# Patient Record
Sex: Male | Born: 1945
Health system: Southern US, Community
[De-identification: ages and names within clinical notes are randomized; demographics above are authoritative.]

## PROBLEM LIST (undated history)

## (undated) DIAGNOSIS — F329 Major depressive disorder, single episode, unspecified: Secondary | ICD-10-CM

## (undated) DIAGNOSIS — G4730 Sleep apnea, unspecified: Secondary | ICD-10-CM

## (undated) DIAGNOSIS — G8929 Other chronic pain: Secondary | ICD-10-CM

## (undated) DIAGNOSIS — E785 Hyperlipidemia, unspecified: Secondary | ICD-10-CM

## (undated) DIAGNOSIS — C61 Malignant neoplasm of prostate: Secondary | ICD-10-CM

## (undated) DIAGNOSIS — I10 Essential (primary) hypertension: Secondary | ICD-10-CM

## (undated) DIAGNOSIS — M1990 Unspecified osteoarthritis, unspecified site: Secondary | ICD-10-CM

## (undated) DIAGNOSIS — N529 Male erectile dysfunction, unspecified: Secondary | ICD-10-CM

## (undated) DIAGNOSIS — F32A Depression, unspecified: Secondary | ICD-10-CM

## (undated) DIAGNOSIS — I2699 Other pulmonary embolism without acute cor pulmonale: Secondary | ICD-10-CM

## (undated) DIAGNOSIS — G4733 Obstructive sleep apnea (adult) (pediatric): Secondary | ICD-10-CM

## (undated) DIAGNOSIS — K219 Gastro-esophageal reflux disease without esophagitis: Secondary | ICD-10-CM

## (undated) DIAGNOSIS — M542 Cervicalgia: Secondary | ICD-10-CM

## (undated) HISTORY — PX: VASECTOMY: SHX75

## (undated) HISTORY — DX: Hyperlipidemia, unspecified: E78.5

## (undated) HISTORY — PX: CARDIAC CATHETERIZATION: SHX172

## (undated) HISTORY — DX: Essential (primary) hypertension: I10

## (undated) HISTORY — DX: Malignant neoplasm of prostate: C61

## (undated) HISTORY — PX: BACK SURGERY: SHX140

## (undated) HISTORY — DX: Sleep apnea, unspecified: G47.30

## (undated) HISTORY — PX: TONSILLECTOMY: SUR1361

## (undated) HISTORY — DX: Gastro-esophageal reflux disease without esophagitis: K21.9

## (undated) HISTORY — DX: Obstructive sleep apnea (adult) (pediatric): G47.33

## (undated) HISTORY — DX: Major depressive disorder, single episode, unspecified: F32.9

## (undated) HISTORY — DX: Depression, unspecified: F32.A

## (undated) HISTORY — DX: Cervicalgia: M54.2

## (undated) HISTORY — DX: Other chronic pain: G89.29

## (undated) HISTORY — DX: Male erectile dysfunction, unspecified: N52.9

## (undated) HISTORY — PX: PROSTATECTOMY: SHX69

---
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
## 1998-11-04 HISTORY — PX: PROSTATECTOMY: SHX69

## 2001-06-04 ENCOUNTER — Other Ambulatory Visit: Admission: RE | Admit: 2001-06-04 | Discharge: 2001-06-04 | Payer: Self-pay | Admitting: Urology

## 2001-06-04 ENCOUNTER — Encounter (INDEPENDENT_AMBULATORY_CARE_PROVIDER_SITE_OTHER): Payer: Self-pay | Admitting: Specialist

## 2006-11-24 ENCOUNTER — Ambulatory Visit: Payer: Self-pay | Admitting: Family Medicine

## 2006-12-05 ENCOUNTER — Ambulatory Visit: Payer: Self-pay | Admitting: Family Medicine

## 2006-12-12 ENCOUNTER — Ambulatory Visit: Payer: Self-pay | Admitting: Gastroenterology

## 2006-12-22 ENCOUNTER — Ambulatory Visit: Payer: Self-pay | Admitting: Gastroenterology

## 2007-06-30 ENCOUNTER — Ambulatory Visit: Payer: Self-pay | Admitting: Family Medicine

## 2007-06-30 DIAGNOSIS — M109 Gout, unspecified: Secondary | ICD-10-CM

## 2007-06-30 DIAGNOSIS — M542 Cervicalgia: Secondary | ICD-10-CM

## 2007-06-30 DIAGNOSIS — E785 Hyperlipidemia, unspecified: Secondary | ICD-10-CM | POA: Insufficient documentation

## 2007-06-30 DIAGNOSIS — I10 Essential (primary) hypertension: Secondary | ICD-10-CM

## 2007-06-30 DIAGNOSIS — Z8546 Personal history of malignant neoplasm of prostate: Secondary | ICD-10-CM

## 2007-07-10 ENCOUNTER — Telehealth: Payer: Self-pay | Admitting: Family Medicine

## 2007-07-13 ENCOUNTER — Encounter: Admission: RE | Admit: 2007-07-13 | Discharge: 2007-07-13 | Payer: Self-pay | Admitting: Family Medicine

## 2007-07-14 ENCOUNTER — Encounter: Payer: Self-pay | Admitting: Family Medicine

## 2007-07-14 LAB — CONVERTED CEMR LAB
ALT: 33 units/L (ref 0–40)
AST: 23 units/L (ref 0–37)
Basophils Relative: 0.5 % (ref 0.0–1.0)
Bilirubin, Direct: 0.1 mg/dL (ref 0.0–0.3)
CO2: 29 meq/L (ref 19–32)
Calcium: 9.7 mg/dL (ref 8.4–10.5)
Chloride: 105 meq/L (ref 96–112)
Creatinine, Ser: 0.9 mg/dL (ref 0.4–1.5)
Eosinophils Relative: 2.2 % (ref 0.0–5.0)
GFR calc Af Amer: 110 mL/min
Glucose, Bld: 96 mg/dL (ref 70–99)
Lymphocytes Relative: 25 % (ref 12.0–46.0)
Neutro Abs: 4.5 10*3/uL (ref 1.4–7.7)
Platelets: 233 10*3/uL (ref 150–400)
RDW: 13.1 % (ref 11.5–14.6)
Total Bilirubin: 0.7 mg/dL (ref 0.3–1.2)
Total Protein: 6.9 g/dL (ref 6.0–8.3)
Triglycerides: 197 mg/dL — ABNORMAL HIGH (ref 0–149)
VLDL: 39 mg/dL (ref 0–40)
WBC: 7.3 10*3/uL (ref 4.5–10.5)

## 2007-07-15 ENCOUNTER — Encounter: Admission: RE | Admit: 2007-07-15 | Discharge: 2007-07-15 | Payer: Self-pay | Admitting: Neurological Surgery

## 2007-07-23 ENCOUNTER — Telehealth: Payer: Self-pay | Admitting: Family Medicine

## 2007-07-24 ENCOUNTER — Encounter: Admission: RE | Admit: 2007-07-24 | Discharge: 2007-07-24 | Payer: Self-pay | Admitting: Anesthesiology

## 2007-08-03 ENCOUNTER — Encounter: Payer: Self-pay | Admitting: Family Medicine

## 2007-11-25 ENCOUNTER — Encounter: Payer: Self-pay | Admitting: Family Medicine

## 2007-12-01 ENCOUNTER — Encounter: Payer: Self-pay | Admitting: Family Medicine

## 2007-12-01 ENCOUNTER — Telehealth (INDEPENDENT_AMBULATORY_CARE_PROVIDER_SITE_OTHER): Payer: Self-pay | Admitting: *Deleted

## 2007-12-02 ENCOUNTER — Ambulatory Visit (HOSPITAL_COMMUNITY): Admission: RE | Admit: 2007-12-02 | Discharge: 2007-12-03 | Payer: Self-pay | Admitting: Neurological Surgery

## 2007-12-02 HISTORY — PX: CERVICAL FUSION: SHX112

## 2007-12-29 ENCOUNTER — Encounter: Admission: RE | Admit: 2007-12-29 | Discharge: 2007-12-29 | Payer: Self-pay | Admitting: Neurological Surgery

## 2008-01-29 ENCOUNTER — Encounter: Admission: RE | Admit: 2008-01-29 | Discharge: 2008-01-29 | Payer: Self-pay | Admitting: Neurological Surgery

## 2008-03-02 ENCOUNTER — Telehealth: Payer: Self-pay | Admitting: Family Medicine

## 2008-03-04 ENCOUNTER — Ambulatory Visit: Payer: Self-pay | Admitting: Family Medicine

## 2008-03-04 DIAGNOSIS — F329 Major depressive disorder, single episode, unspecified: Secondary | ICD-10-CM

## 2008-03-07 ENCOUNTER — Encounter: Payer: Self-pay | Admitting: Family Medicine

## 2008-03-07 LAB — CONVERTED CEMR LAB
ALT: 26 units/L (ref 0–53)
AST: 28 units/L (ref 0–37)
Basophils Relative: 0.4 % (ref 0.0–1.0)
Bilirubin, Direct: 0.1 mg/dL (ref 0.0–0.3)
CO2: 28 meq/L (ref 19–32)
Calcium: 9.5 mg/dL (ref 8.4–10.5)
Chloride: 106 meq/L (ref 96–112)
Creatinine, Ser: 0.9 mg/dL (ref 0.4–1.5)
Glucose, Bld: 87 mg/dL (ref 70–99)
Hemoglobin: 14 g/dL (ref 13.0–17.0)
LDL Cholesterol: 113 mg/dL — ABNORMAL HIGH (ref 0–99)
Lymphocytes Relative: 24.3 % (ref 12.0–46.0)
Monocytes Relative: 11.8 % (ref 3.0–12.0)
Neutro Abs: 2.9 10*3/uL (ref 1.4–7.7)
Neutrophils Relative %: 61.4 % (ref 43.0–77.0)
RBC: 4.35 M/uL (ref 4.22–5.81)
TSH: 0.74 microintl units/mL (ref 0.35–5.50)
Total Bilirubin: 0.9 mg/dL (ref 0.3–1.2)
Total CHOL/HDL Ratio: 3
Total Protein: 6.9 g/dL (ref 6.0–8.3)
VLDL: 14 mg/dL (ref 0–40)
WBC: 4.6 10*3/uL (ref 4.5–10.5)

## 2008-07-04 ENCOUNTER — Ambulatory Visit: Payer: Self-pay | Admitting: Family Medicine

## 2008-08-30 ENCOUNTER — Ambulatory Visit: Payer: Self-pay | Admitting: Family Medicine

## 2009-03-08 ENCOUNTER — Telehealth: Payer: Self-pay | Admitting: Family Medicine

## 2009-03-10 ENCOUNTER — Ambulatory Visit: Payer: Self-pay | Admitting: Family Medicine

## 2009-03-15 LAB — CONVERTED CEMR LAB
Alkaline Phosphatase: 51 units/L (ref 39–117)
BUN: 17 mg/dL (ref 6–23)
Basophils Absolute: 0 10*3/uL (ref 0.0–0.1)
Basophils Relative: 0.4 % (ref 0.0–3.0)
Bilirubin, Direct: 0.1 mg/dL (ref 0.0–0.3)
CO2: 26 meq/L (ref 19–32)
Calcium: 9.4 mg/dL (ref 8.4–10.5)
Chloride: 108 meq/L (ref 96–112)
Creatinine, Ser: 1.1 mg/dL (ref 0.4–1.5)
Eosinophils Absolute: 0.1 10*3/uL (ref 0.0–0.7)
Lymphocytes Relative: 26.7 % (ref 12.0–46.0)
MCHC: 34.8 g/dL (ref 30.0–36.0)
MCV: 94.8 fL (ref 78.0–100.0)
Monocytes Absolute: 0.6 10*3/uL (ref 0.1–1.0)
Neutrophils Relative %: 61 % (ref 43.0–77.0)
Platelets: 170 10*3/uL (ref 150.0–400.0)
RBC: 4.43 M/uL (ref 4.22–5.81)
RDW: 13.9 % (ref 11.5–14.6)
Total Bilirubin: 0.9 mg/dL (ref 0.3–1.2)
Total Protein: 6.9 g/dL (ref 6.0–8.3)
Uric Acid, Serum: 7.4 mg/dL (ref 4.0–7.8)

## 2009-09-26 ENCOUNTER — Ambulatory Visit: Payer: Self-pay | Admitting: Family Medicine

## 2009-09-27 LAB — CONVERTED CEMR LAB
Alkaline Phosphatase: 51 units/L (ref 39–117)
Bilirubin, Direct: 0.1 mg/dL (ref 0.0–0.3)
CO2: 30 meq/L (ref 19–32)
Calcium: 10.1 mg/dL (ref 8.4–10.5)
Chloride: 104 meq/L (ref 96–112)
Direct LDL: 143 mg/dL
Eosinophils Absolute: 0.1 10*3/uL (ref 0.0–0.7)
Eosinophils Relative: 1.5 % (ref 0.0–5.0)
Glucose, Bld: 95 mg/dL (ref 70–99)
HDL: 59.1 mg/dL (ref >39.00)
Lymphocytes Relative: 25.4 % (ref 12.0–46.0)
MCV: 97 fL (ref 78.0–100.0)
Monocytes Absolute: 0.8 10*3/uL (ref 0.1–1.0)
Neutrophils Relative %: 60.6 % (ref 43.0–77.0)
Nitrite: NEGATIVE
PSA: 0.01 ng/mL — ABNORMAL LOW (ref 0.10–4.00)
Platelets: 166 10*3/uL (ref 150.0–400.0)
Potassium: 5.1 meq/L (ref 3.5–5.1)
RBC: 4.68 M/uL (ref 4.22–5.81)
Sodium: 141 meq/L (ref 135–145)
Total Bilirubin: 1.2 mg/dL (ref 0.3–1.2)
Total Protein, Urine: NEGATIVE mg/dL
Triglycerides: 77 mg/dL (ref 0.0–149.0)
Urine Glucose: NEGATIVE mg/dL
VLDL: 15.4 mg/dL (ref 0.0–40.0)
WBC: 6.7 10*3/uL (ref 4.5–10.5)
pH: 7 (ref 5.0–8.0)

## 2009-10-04 ENCOUNTER — Ambulatory Visit: Payer: Self-pay | Admitting: Family Medicine

## 2009-11-20 ENCOUNTER — Encounter: Payer: Self-pay | Admitting: Family Medicine

## 2009-12-08 ENCOUNTER — Encounter: Payer: Self-pay | Admitting: Family Medicine

## 2009-12-29 ENCOUNTER — Encounter: Payer: Self-pay | Admitting: Family Medicine

## 2010-09-24 ENCOUNTER — Ambulatory Visit: Payer: Self-pay | Admitting: Family Medicine

## 2010-10-01 ENCOUNTER — Ambulatory Visit: Payer: Self-pay | Admitting: Family Medicine

## 2010-11-26 ENCOUNTER — Encounter: Payer: Self-pay | Admitting: Neurological Surgery

## 2010-12-04 NOTE — Miscellaneous (Signed)
Summary: [facility]  [facility]   Imported By: Sherian Rein 11/29/2009 09:13:21  _____________________________________________________________________  External Attachment:    Type:   Image     Comment:   External Document

## 2010-12-04 NOTE — Letter (Signed)
Summary: [facility]  [facility]   Imported By: Maryln Gottron 12/13/2009 15:03:15  _____________________________________________________________________  External Attachment:    Type:   Image     Comment:   External Document

## 2010-12-04 NOTE — Assessment & Plan Note (Signed)
Summary: flu shot/njr pt will come with wife around 330pm/njr  Nurse Visit   Review of Systems       Flu Vaccine Consent Questions     Do you have a history of severe allergic reactions to this vaccine? no    Any prior history of allergic reactions to egg and/or gelatin? no    Do you have a sensitivity to the preservative Thimersol? no    Do you have a past history of Guillan-Barre Syndrome? no    Do you currently have an acute febrile illness? no    Have you ever had a severe reaction to latex? no    Vaccine information given and explained to patient? yes    Are you currently pregnant? no    Lot Number:[id number]   Exp Date:05/04/2011   Site Given  Left Deltoid IM Pura Spice, RN  September 24, 2010 3:35 PM    Allergies: No Known Drug Allergies  Orders Added: 1)  Flu Vaccine 67yrs + MEDICARE PATIENTS [Q2039] 2)  Administration Flu vaccine - MCR [G0008]

## 2010-12-04 NOTE — Medication Information (Signed)
Summary: Viagra Approved/[employer]  Viagra Approved/[employer]   Imported By: Sherian Rein 01/04/2010 11:55:32  _____________________________________________________________________  External Attachment:    Type:   Image     Comment:   External Document

## 2010-12-04 NOTE — Assessment & Plan Note (Signed)
Summary: pain in lower lft abdomen/?pulled muscle per pt/cjr   Vital Signs:  Patient profile:   65 year old male Weight:      253 pounds BMI:     35.41 O2 Sat:      97 % Temp:     99.8 degrees F Pulse rate:   92 / minute BP sitting:   120 / 80  (left arm) Cuff size:   large  Vitals Entered By: Pura Spice, RN (October 01, 2010 4:21 PM) CC: ?pulled muscle in left lower abd after lifting heavy stuff x 2 days".  Refill Viagra.   History of Present Illness: Here for 2 problems. First for 2 weeks he has had some sinus pressure, PND, ST, and a dry cough. Mucinex has not helped. Second, about one week ago while lifting some heavy blocks of wood for splitting he had the sudden onset of a sharp severe pain in the left groin. This has improved a little since then but is still painful. No lumps have been seen or felt. No troubel urinating or with BMs.   Allergies: No Known Drug Allergies  Past History:  Past Medical History: Reviewed history from 10/04/2009 and no changes required. Gout Hyperlipidemia Hypertension Depression hx of prostate cancer, sees Dr. Aldean Ast chronic neck pain ED  Past Surgical History: Reviewed history from 03/10/2009 and no changes required. Prostatectomy per Dr. Aldean Ast 2003 Tonsillectomy Vasectomy colonoscopy 12-22-06 per Dr. Christella Hartigan, repeat in 10 yrs Cervical fusion 12-02-07 per Dr. Marikay Alar  Review of Systems  The patient denies anorexia, fever, weight loss, weight gain, vision loss, decreased hearing, hoarseness, chest pain, syncope, dyspnea on exertion, peripheral edema, headaches, hemoptysis, melena, hematochezia, severe indigestion/heartburn, hematuria, incontinence, genital sores, muscle weakness, suspicious skin lesions, transient blindness, difficulty walking, depression, unusual weight change, abnormal bleeding, enlarged lymph nodes, angioedema, breast masses, and testicular masses.    Physical Exam  General:   Well-developed,well-nourished,in no acute distress; alert,appropriate and cooperative throughout examination Head:  Normocephalic and atraumatic without obvious abnormalities. No apparent alopecia or balding. Eyes:  No corneal or conjunctival inflammation noted. EOMI. Perrla. Funduscopic exam benign, without hemorrhages, exudates or papilledema. Vision grossly normal. Ears:  External ear exam shows no significant lesions or deformities.  Otoscopic examination reveals clear canals, tympanic membranes are intact bilaterally without bulging, retraction, inflammation or discharge. Hearing is grossly normal bilaterally. Nose:  External nasal examination shows no deformity or inflammation. Nasal mucosa are pink and moist without lesions or exudates. Mouth:  Oral mucosa and oropharynx without lesions or exudates.  Teeth in good repair. Neck:  No deformities, masses, or tenderness noted. Lungs:  Normal respiratory effort, chest expands symmetrically. Lungs are clear to auscultation, no crackles or wheezes. Abdomen:  soft, normal bowel sounds, no distention, no masses, no guarding, no rigidity, no rebound tenderness, no abdominal hernia, no inguinal hernia, no hepatomegaly, and no splenomegaly.  Mildly tender in the LLQ just above the inguinal ligament.  Genitalia:  Testes bilaterally descended without nodularity, tenderness or masses. No scrotal masses or lesions. No penis lesions or urethral discharge. Inguinal Nodes:  No significant adenopathy   Impression & Recommendations:  Problem # 1:  GROIN PAIN (ICD-789.09)  His updated medication list for this problem includes:    Indomethacin 50 Mg Caps (Indomethacin) .Marland Kitchen... 1 every 6 hours as needed gout    Vicodin Hp 10-660 Mg Tabs (Hydrocodone-acetaminophen) .Marland Kitchen... 1 q 6 hours as needed pain  Problem # 2:  ACUTE SINUSITIS, UNSPECIFIED (ICD-461.9)  His updated medication  list for this problem includes:    Augmentin 875-125 Mg Tabs (Amoxicillin-pot  clavulanate) .Marland Kitchen..Marland Kitchen Two times a day  Complete Medication List: 1)  Allopurinol 100 Mg Tabs (Allopurinol) .Marland Kitchen.. 1 by mouth once daily 2)  Paroxetine Hcl 20 Mg Tabs (Paroxetine hcl) .Marland Kitchen.. 1 by mouth once daily 3)  Simvastatin 20 Mg Tabs (Simvastatin) .Marland Kitchen.. 1 by mouth once daily 4)  Viagra 100 Mg Tabs (Sildenafil citrate) .... As needed 5)  Indomethacin 50 Mg Caps (Indomethacin) .Marland Kitchen.. 1 every 6 hours as needed gout 6)  Lisinopril-hydrochlorothiazide 20-25 Mg Tabs (Lisinopril-hydrochlorothiazide) .... Once daily 7)  Augmentin 875-125 Mg Tabs (Amoxicillin-pot clavulanate) .... Two times a day 8)  Vicodin Hp 10-660 Mg Tabs (Hydrocodone-acetaminophen) .Marland Kitchen.. 1 q 6 hours as needed pain  Patient Instructions: 1)  The pain seems to be from a muscle strain, although an early hernia cannot be excluded. He will rest and take pain meds. Use Augmentin for th sinusitis. 2)  Please schedule a follow-up appointment as needed .  Prescriptions: VICODIN HP 10-660 MG TABS (HYDROCODONE-ACETAMINOPHEN) 1 q 6 hours as needed pain  #60 x 0   Entered and Authorized by:   Nelwyn Salisbury MD   Signed by:   Nelwyn Salisbury MD on 10/01/2010   Method used:   Print then Give to Patient   RxID:   [id number] AUGMENTIN 875-125 MG TABS (AMOXICILLIN-POT CLAVULANATE) two times a day  #20 x 0   Entered and Authorized by:   Nelwyn Salisbury MD   Signed by:   Nelwyn Salisbury MD on 10/01/2010   Method used:   Print then Give to Patient   RxID:   [id number] VIAGRA 100 MG  TABS (SILDENAFIL CITRATE) as needed  #10 x 11   Entered and Authorized by:   Nelwyn Salisbury MD   Signed by:   Nelwyn Salisbury MD on 10/01/2010   Method used:   Print then Give to Patient   RxID:   [id number]    Orders Added: 1)  Est. Patient Level IV [01027]

## 2010-12-04 NOTE — Letter (Signed)
Summary: [facility]  [facility]   Imported By: Maryln Gottron 11/23/2009 14:17:31  _____________________________________________________________________  External Attachment:    Type:   Image     Comment:   External Document

## 2011-01-10 ENCOUNTER — Telehealth: Payer: Self-pay | Admitting: Family Medicine

## 2011-01-10 MED ORDER — SIMVASTATIN 20 MG PO TABS: 20.0000 mg | ORAL_TABLET | Freq: Every day | ORAL | Status: DC

## 2011-01-10 MED ORDER — LISINOPRIL-HYDROCHLOROTHIAZIDE 20-25 MG PO TABS: 1.0000 | ORAL_TABLET | Freq: Every day | ORAL | Status: DC

## 2011-01-10 MED ORDER — ALLOPURINOL 100 MG PO TABS: 100.0000 mg | ORAL_TABLET | Freq: Every day | ORAL | Status: DC

## 2011-01-10 NOTE — Telephone Encounter (Signed)
Ricky Meyers phar is requesting allupurinol 100 mg,simvastatin 20mg  and lisinopril-hctz 20-25mg  #90 with 3 refills reference #[id number]. Fax#[phone]

## 2011-03-19 NOTE — Op Note (Signed)
NAMEMOHMED, FARVER NO.:  [id number]   MEDICAL RECORD NO.:  [id number]          PATIENT TYPE:  OIB   LOCATION:  3534                         FACILITY:  [facility]   PHYSICIAN:  Tia Alert, MD     DATE OF BIRTH:  11-15-1945   DATE OF PROCEDURE:  12/02/2007  DATE OF DISCHARGE:                               OPERATIVE REPORT   PREOPERATIVE DIAGNOSIS:  Cervical spondylosis with neural foraminal  stenosis C3-4, C4-5 on the left with neck and left shoulder pain.   POSTOPERATIVE DIAGNOSIS:  Cervical spondylosis with neural foraminal  stenosis C3-4, C4-5 on the left with neck and left shoulder pain.   PROCEDURES:  1. Decompressive anterior cervical diskectomy C3-4, C4-5.  2. Anterior cervical arthrodesis C3-4, C4-5 utilizing a 7 mm graft to      C3-4.  8 mm graft at C4-5.  3. Anterior cervical plating C3-C5 inclusive utilizing a 44-mm      Atlantis Venture plate.   SURGEON:  Dr. Marikay Alar.   ASSISTANT:  Donalee Citrin, M.D.   ANESTHESIA:  General endotracheal.   COMPLICATIONS:  None apparent.   INDICATIONS FOR PROCEDURE:  Mr. Lecomte is a very pleasant 65 year old  gentleman who presented with severe left sided neck pain.  It did  radiate into his shoulder causing pretty significant pain.  He tried  medical management for some time without significant relief.  I  recommended a anterior cervical diskectomy with fusion and plating at C3-  4, C4-5 when a MRI and a CT scan showed severe spondylosis at C3-4, C4-5  severe facet arthrosis and significant foraminal stenosis.  He also had  degenerative disk disease C5-6 but I did not feel that needed to be  addressed at this time.  He understood the risks, benefits, expected  outcome and wished to proceed.   DESCRIPTION OF PROCEDURE:  The patient was taken operating room after  induction of adequate generalized endotracheal anesthesia he was placed  in supine position on the operating room table.  His right anterior  cervical region was prepped DuraPrep and draped usual sterile fashion.  5 mL local anesthesia injected and right transverse incision was made  and carried down to the platysma which was elevated, opened and  undermined with Metzenbaum scissors.  I then dissected in a plane medial  to the sternocleidomastoid muscle, internal carotid artery and lateral  to the trachea and esophagus to expose C3-4, C4-5.  Intraoperative  fluoroscopy confirmed my level and then the longus colli muscles were  taken down and shadow line retractors were placed under this to expose  C3-4 and C4-5.  Anulus was incised and the initial diskectomy was done  at both levels with pituitary rongeur and curved Karlin curettes.  I  then used the high-speed drill to drill the endplates to prepare for  later arthrodesis.  I drilled to a height of 7 mL of C3-4.  8 mm of C4-  5, drilled down to the level of the posterior longitudinal ligament.  I  brought in the operating microscope.  This ligament was opened and it  was  removed while starting at C4-5 and undercutting the bodies of C4 and  C5 and marched along the superior endplate until I met the pedicles  bilaterally and marched along the pedicle, decompressing the C5 nerve  root on the left side until I had a generous foraminotomy over that  nerve root and I could run the nerve hook from the medial all the way to  the lateral pedicle wall.  I had a generous foraminotomy there.  I then  palpated with a nerve hook in a circumferential fashion to assure  adequate decompression of central canal and neural foramina bilaterally.  The dura was full and capacious all the way across.  The exact same  decompression was done at C3-4, undercutting the bodies of C3 and C4 and  performed generous foraminotomies until the C4 nerve roots were  decompressed and then palpated with a nerve hook to assure adequate  decompression in circumferential fashion.  I then measured interspaces  and  placed a 7-mm corticocancellous allograft in at C3-4 and an 8 mm  graft at C4-5.  We then used a 44-mm Atlantis Venture plate, placed 13  mm variable angle screws in the bodies of C3, C4 and C5 and these locked  into the plate by locking mechanism within the plate.  We then irrigated  saline solution containing bacitracin, dried all bleeding points bipolar  cautery and with Surgifoam we then once meticulous hemostasis was  achieved closed the platysma with 3-0 Vicryl closing subcuticular  tissues 3-0 Vicryl, closed skin with Benzoin Steri-Strips.  The drapes  removed.  A sterile dressing was applied.  The patient was awakened from  general anesthesia and transferred recovery room stable condition.  At  the end of procedure all sponge, needle and instrument counts were  correct.      Tia Alert, MD  Electronically Signed     DSJ/MEDQ  D:  12/02/2007  T:  12/03/2007  Job:  [id number]

## 2011-03-22 NOTE — Assessment & Plan Note (Signed)
[facility] OFFICE NOTE   Ricky Meyers, Ricky Meyers                      MRN:          [id number]  DATE:11/24/2006                            DOB:          May 17, 1946    This is a 65 year old gentleman here to establish with our practice and  who is also for a complete physical examination. His last physical was a  little more than a year ago. He had been seeing Dr. Arnette Norris for  primary care before deciding to transfer to [place]. In general, he is doing  well and has no acute complaints. He would like my advice about coming  off of Paxil. However, he was put on Paxil for depression about 4 or 5  years ago when he was diagnosed with prostate cancer. This was a rough  period in his life because he also went through a difficult divorce  around the same time. He is doing quite well now however. He is happy,  his mood are good and feels he no longer requires the medication.   OTHER PAST MEDICAL HISTORY:  1. He was diagnosed with prostate cancer in May of 2003. At that time,      he had a total prostatectomy with radiation therapy under the      direction of Dr. Aldean Ast. He continues to see Dr. Aldean Ast on a      regular basis for yearly examinations.  2. He has had a vasectomy.  3. He has had a tonsillectomy.  4. He had scarlet fever at the age of 8 years and apparently      recovered well from that.  5. He has high cholesterol and has been on medications for several      years for that.  6. He has hypertension.  7. He has gout, which only flares up once or twice a year.  8. He did receive some blood transfusions around the time of his      prostatectomy.   ALLERGIES:  None.   CURRENT MEDICATIONS:  1. Lisinopril 20 mg b.i.d.  2. Allopurinol 100 mg per day.  3. Paroxetine 20 mg per day.  4. Simvastatin 20 mg per day.   HABITS:  He does drink some alcohol. He does not use tobacco.   SOCIAL HISTORY:  He is  divorced. He does live with a long time partner  however. 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   FAMILY HISTORY:  Remarkable for breast cancer in his mother, prostate  cancer in his father and also strokes and hypertension.   Of note, during his preoperative workup in May of 2003, he was  discovered to have an abnormal EKG and after I questioned him, he did  reveal that it was a type of bundle branch block (which we saw today as  noted later on). He saw Dr. Tenny Craw of [facility] for workup at  that time, which included a stress test. He was told that there was  no  sign of any blockages and his heart was actually fine.   OBJECTIVE:  Height 5 feet, 11 inches. Weight is 273. Blood pressure  124/76, pulse 80 and regular.  In general, he is quite overweight.  SKIN: Is clear.  EYES: Are clear.  EARS: Are clear.  PHARYNX: Is clear.  NECK: Supple, without lymphadenopathy or masses.  LUNGS:  Clear.  CARDIAC: Rate is regular and rhythm is regular with an occasional  ectopic beat. There is a 2/6 systolic murmur loudest at the base. There  are no gallops or rubs. Distal pulses are full.   EKG today shows sinus rhythm with a few premature ventricular  contractions. He does have left bundle branch block.   ABDOMEN: Soft. Normal bowel sounds, nontender and no masses.  GENITALIA: Normal male.  RECTAL: No masses or tenderness. Prostate is absent. Stool is Hemoccult  negative.  EXTREMITIES: No clubbing, cyanosis or edema.  NEUROLOGIC: Grossly intact.   ASSESSMENT/PLAN:  1. Complete physical. We talked about increasing exercise and losing      weight. Will have him return soon in a fasting state for complete      laboratories.  2. Left bundle branch block, probably stable. I will try to get      records of his cardiology workup as noted above.  3. Health maintenance. Will set him up for a screening  colonoscopy      soon.  4. Hypertension, stable.  5. He was given a flu shot today.  6. Gout, stable. Will check a uric acid level.  7. Depression, apparently resolved, will taper off of Paxil over the      next 2 weeks. He is to take 10 mg a day for 2 weeks and then he may      stop. He can follow up as needed.  8. History of prostate cancer. He will follow up with Dr. Aldean Ast.  9. Hyperlipidemia. Will check a fasting lipid panel as above.     Tera Mater. Clent Ridges, MD  Electronically Signed    SAF/MedQ  DD: 11/24/2006  DT: 11/24/2006  Job #: [id number]

## 2011-06-26 ENCOUNTER — Telehealth: Payer: Self-pay

## 2011-06-26 NOTE — Telephone Encounter (Signed)
Advised pt to check with insurance company to see if vaccine is covered

## 2011-06-26 NOTE — Telephone Encounter (Signed)
Partner, Harriett Sine, states that pt is requesting a shingles vaccine and would like to know if fits the category to get one. Please advise

## 2011-07-25 LAB — DIFFERENTIAL
Basophils Absolute: 0
Lymphocytes Relative: 22
Lymphs Abs: 1.7
Monocytes Absolute: 0.8
Monocytes Relative: 10
Neutro Abs: 5.2

## 2011-07-25 LAB — CBC
Hemoglobin: 13.8
RBC: 4.38
RDW: 13.4
WBC: 7.8

## 2011-07-25 LAB — BASIC METABOLIC PANEL
GFR calc non Af Amer: >60
Potassium: 3.9
Sodium: 135

## 2011-07-25 LAB — APTT: aPTT: 27

## 2011-09-05 ENCOUNTER — Encounter: Payer: Self-pay | Admitting: Family Medicine

## 2011-09-05 ENCOUNTER — Ambulatory Visit (INDEPENDENT_AMBULATORY_CARE_PROVIDER_SITE_OTHER): Payer: Managed Care, Other (non HMO) | Admitting: Family Medicine

## 2011-09-05 VITALS — BP 120/78 | HR 68 | Temp 97.6°F | Wt 252.0 lb

## 2011-09-05 DIAGNOSIS — M79606 Pain in leg, unspecified: Secondary | ICD-10-CM

## 2011-09-05 DIAGNOSIS — Z23 Encounter for immunization: Secondary | ICD-10-CM

## 2011-09-05 DIAGNOSIS — Z2911 Encounter for prophylactic immunotherapy for respiratory syncytial virus (RSV): Secondary | ICD-10-CM

## 2011-09-05 DIAGNOSIS — M79609 Pain in unspecified limb: Secondary | ICD-10-CM

## 2011-09-05 DIAGNOSIS — Z0000 Encounter for general adult medical examination without abnormal findings: Secondary | ICD-10-CM

## 2011-09-05 DIAGNOSIS — N529 Male erectile dysfunction, unspecified: Secondary | ICD-10-CM

## 2011-09-05 MED ORDER — SILDENAFIL CITRATE 100 MG PO TABS: 100.0000 mg | ORAL_TABLET | Freq: Every day | ORAL | Status: DC | PRN

## 2011-09-05 NOTE — Progress Notes (Signed)
  Subjective:    Patient ID: Ricky Meyers, male    DOB: 1946/01/20, 65 y.o.   MRN: [id number]  HPI Here asking about some pains in the left leg and whether this could be shingles. The pains started 3 days ago, they are sharp, and they come and go. One area of pain is the lateral left knee which hurts whenever he bends the knee. He has been walking a lot lately for exercise , and he thinks he may have overdone it. No rash.    Review of Systems  Constitutional: Negative.   Cardiovascular: Negative.        Objective:   Physical Exam  Constitutional: He appears well-developed and well-nourished.  Musculoskeletal: He exhibits no edema.       Mildly tender over the left proximal fibula  Skin: Skin is warm and dry. No rash noted. No erythema.          Assessment & Plan:  These leg pains are muscular and are not due to shingles. He will rest and back off a bit on his exercise program. Viagra works well for him and he needs refills.

## 2011-09-05 NOTE — Progress Notes (Signed)
Addended by: Aniceto Boss A on: 09/05/2011 09:12 AM   Modules accepted: Orders

## 2011-09-19 ENCOUNTER — Ambulatory Visit (INDEPENDENT_AMBULATORY_CARE_PROVIDER_SITE_OTHER): Payer: Managed Care, Other (non HMO) | Admitting: Family Medicine

## 2011-09-19 ENCOUNTER — Other Ambulatory Visit (INDEPENDENT_AMBULATORY_CARE_PROVIDER_SITE_OTHER): Payer: Managed Care, Other (non HMO)

## 2011-09-19 VITALS — BP 142/88 | Ht 72.0 in | Wt 252.0 lb

## 2011-09-19 DIAGNOSIS — I10 Essential (primary) hypertension: Secondary | ICD-10-CM

## 2011-09-19 DIAGNOSIS — Z0000 Encounter for general adult medical examination without abnormal findings: Secondary | ICD-10-CM

## 2011-09-19 LAB — CBC WITH DIFFERENTIAL/PLATELET
Basophils Absolute: 0 10*3/uL (ref 0.0–0.1)
Eosinophils Relative: 2.4 % (ref 0.0–5.0)
HCT: 40.7 % (ref 39.0–52.0)
Hemoglobin: 13.7 g/dL (ref 13.0–17.0)
Lymphocytes Relative: 26 % (ref 12.0–46.0)
Lymphs Abs: 1.4 10*3/uL (ref 0.7–4.0)
Monocytes Relative: 12.2 % — ABNORMAL HIGH (ref 3.0–12.0)
Neutro Abs: 3.2 10*3/uL (ref 1.4–7.7)
Platelets: 174 10*3/uL (ref 150.0–400.0)
WBC: 5.4 10*3/uL (ref 4.5–10.5)

## 2011-09-19 LAB — BASIC METABOLIC PANEL
CO2: 27 mEq/L (ref 19–32)
Calcium: 9.6 mg/dL (ref 8.4–10.5)
Sodium: 139 mEq/L (ref 135–145)

## 2011-09-19 LAB — LIPID PANEL
Cholesterol: 196 mg/dL (ref 0–200)
LDL Cholesterol: 124 mg/dL — ABNORMAL HIGH (ref 0–99)
VLDL: 15.2 mg/dL (ref 0.0–40.0)

## 2011-09-19 LAB — POCT URINALYSIS DIPSTICK
Glucose, UA: NEGATIVE
Leukocytes, UA: NEGATIVE
Nitrite, UA: NEGATIVE
Spec Grav, UA: 1.025
Urobilinogen, UA: 0.2

## 2011-09-19 LAB — HEPATIC FUNCTION PANEL
ALT: 27 U/L (ref 0–53)
AST: 25 U/L (ref 0–37)
Alkaline Phosphatase: 52 U/L (ref 39–117)
Total Bilirubin: 1.1 mg/dL (ref 0.3–1.2)

## 2011-09-20 LAB — TSH: TSH: 0.88 u[IU]/mL (ref 0.35–5.50)

## 2011-09-20 LAB — PSA: PSA: 0 ng/mL — ABNORMAL LOW (ref 0.10–4.00)

## 2011-09-23 NOTE — Progress Notes (Signed)
Quick Note:  Bennetta Laos, designated part release informed ______

## 2011-10-16 ENCOUNTER — Other Ambulatory Visit: Payer: Managed Care, Other (non HMO)

## 2011-10-23 ENCOUNTER — Ambulatory Visit (INDEPENDENT_AMBULATORY_CARE_PROVIDER_SITE_OTHER): Payer: Managed Care, Other (non HMO) | Admitting: Family Medicine

## 2011-10-23 ENCOUNTER — Encounter: Payer: Self-pay | Admitting: Family Medicine

## 2011-10-23 VITALS — BP 120/82 | HR 81 | Temp 98.1°F | Ht 70.0 in | Wt 256.0 lb

## 2011-10-23 DIAGNOSIS — Z0000 Encounter for general adult medical examination without abnormal findings: Secondary | ICD-10-CM

## 2011-10-23 NOTE — Progress Notes (Signed)
  Subjective:    Patient ID: Ricky Meyers, male    DOB: [birth year], 65 y.o.   MRN: [id number]  HPI 65 yr old male for a cpx. He feels well and has no concerns.    Review of Systems  Constitutional: Negative.   HENT: Negative.   Eyes: Negative.   Respiratory: Negative.   Cardiovascular: Negative.   Gastrointestinal: Negative.   Genitourinary: Negative.   Musculoskeletal: Negative.   Skin: Negative.   Neurological: Negative.   Hematological: Negative.   Psychiatric/Behavioral: Negative.        Objective:   Physical Exam  Constitutional: He is oriented to person, place, and time. He appears well-developed and well-nourished. No distress.  HENT:  Head: Normocephalic and atraumatic.  Right Ear: External ear normal.  Left Ear: External ear normal.  Nose: Nose normal.  Mouth/Throat: Oropharynx is clear and moist. No oropharyngeal exudate.  Eyes: Conjunctivae and EOM are normal. Pupils are equal, round, and reactive to light. Right eye exhibits no discharge. Left eye exhibits no discharge. No scleral icterus.  Neck: Neck supple. No JVD present. No tracheal deviation present. No thyromegaly present.  Cardiovascular: Normal rate, regular rhythm, normal heart sounds and intact distal pulses.  Exam reveals no gallop and no friction rub.   No murmur heard.      EKG is at his baseline with LBBB and occasional PVCs  Pulmonary/Chest: Effort normal and breath sounds normal. No respiratory distress. He has no wheezes. He has no rales. He exhibits no tenderness.  Abdominal: Soft. Bowel sounds are normal. He exhibits no distension and no mass. There is no tenderness. There is no rebound and no guarding.  Genitourinary: Rectum normal, prostate normal and penis normal. Guaiac negative stool. No penile tenderness.  Musculoskeletal: Normal range of motion. He exhibits no edema and no tenderness.  Lymphadenopathy:    He has no cervical adenopathy.  Neurological: He is alert and oriented to  person, place, and time. He has normal reflexes. No cranial nerve deficit. He exhibits normal muscle tone. Coordination normal.  Skin: Skin is warm and dry. No rash noted. He is not diaphoretic. No erythema. No pallor.  Psychiatric: He has a normal mood and affect. His behavior is normal. Judgment and thought content normal.          Assessment & Plan:  Well exam. He needs to lose weight.

## 2011-12-09 ENCOUNTER — Other Ambulatory Visit: Payer: Self-pay | Admitting: Family Medicine

## 2011-12-09 NOTE — Telephone Encounter (Signed)
Pt needs new rxs alluprionol 100mg  #90,lisinopril hctz 20-25 #90,simvastatin 20 mg#90 and paroxetine 20mg  #90 with 3 refills. Pt will pick up rxs.

## 2011-12-11 MED ORDER — SIMVASTATIN 20 MG PO TABS: 20.0000 mg | ORAL_TABLET | Freq: Every day | ORAL | Status: DC

## 2011-12-11 MED ORDER — ALLOPURINOL 100 MG PO TABS: 100.0000 mg | ORAL_TABLET | Freq: Every day | ORAL | Status: DC

## 2011-12-11 MED ORDER — LISINOPRIL-HYDROCHLOROTHIAZIDE 20-25 MG PO TABS: 1.0000 | ORAL_TABLET | Freq: Every day | ORAL | Status: DC

## 2011-12-11 MED ORDER — PAROXETINE HCL 20 MG PO TABS: 20.0000 mg | ORAL_TABLET | ORAL | Status: DC

## 2011-12-11 NOTE — Telephone Encounter (Signed)
Scripts printed

## 2012-02-06 ENCOUNTER — Ambulatory Visit: Payer: Managed Care, Other (non HMO) | Admitting: Family

## 2012-02-14 ENCOUNTER — Other Ambulatory Visit: Payer: Self-pay | Admitting: Family Medicine

## 2012-02-14 DIAGNOSIS — M549 Dorsalgia, unspecified: Secondary | ICD-10-CM

## 2012-09-29 ENCOUNTER — Other Ambulatory Visit (INDEPENDENT_AMBULATORY_CARE_PROVIDER_SITE_OTHER): Payer: Managed Care, Other (non HMO)

## 2012-09-29 DIAGNOSIS — Z0000 Encounter for general adult medical examination without abnormal findings: Secondary | ICD-10-CM

## 2012-09-29 DIAGNOSIS — Z0279 Encounter for issue of other medical certificate: Secondary | ICD-10-CM

## 2012-09-29 LAB — HEPATIC FUNCTION PANEL
ALT: 32 U/L (ref 0–53)
Albumin: 4.2 g/dL (ref 3.5–5.2)
Total Protein: 7 g/dL (ref 6.0–8.3)

## 2012-09-29 LAB — BASIC METABOLIC PANEL
CO2: 28 mEq/L (ref 19–32)
Calcium: 9.6 mg/dL (ref 8.4–10.5)
Chloride: 101 mEq/L (ref 96–112)
Glucose, Bld: 105 mg/dL — ABNORMAL HIGH (ref 70–99)
Sodium: 137 mEq/L (ref 135–145)

## 2012-09-29 LAB — POCT URINALYSIS DIPSTICK
Leukocytes, UA: NEGATIVE
Nitrite, UA: NEGATIVE
Protein, UA: NEGATIVE
Urobilinogen, UA: 1
pH, UA: 6

## 2012-09-29 LAB — CBC WITH DIFFERENTIAL/PLATELET
Basophils Absolute: 0 10*3/uL (ref 0.0–0.1)
Eosinophils Relative: 1.7 % (ref 0.0–5.0)
HCT: 44 % (ref 39.0–52.0)
Hemoglobin: 14.9 g/dL (ref 13.0–17.0)
Lymphocytes Relative: 25 % (ref 12.0–46.0)
Lymphs Abs: 1.4 10*3/uL (ref 0.7–4.0)
Monocytes Relative: 12.1 % — ABNORMAL HIGH (ref 3.0–12.0)
Neutro Abs: 3.5 10*3/uL (ref 1.4–7.7)
Platelets: 179 10*3/uL (ref 150.0–400.0)
RDW: 14.3 % (ref 11.5–14.6)
WBC: 5.7 10*3/uL (ref 4.5–10.5)

## 2012-09-29 LAB — LIPID PANEL
HDL: 61.9 mg/dL (ref >39.00)
Total CHOL/HDL Ratio: 4
Triglycerides: 181 mg/dL — ABNORMAL HIGH (ref 0.0–149.0)

## 2012-09-29 LAB — PSA: PSA: 0.01 ng/mL — ABNORMAL LOW (ref 0.10–4.00)

## 2012-09-29 LAB — LDL CHOLESTEROL, DIRECT: Direct LDL: 161.4 mg/dL

## 2012-09-30 NOTE — Progress Notes (Signed)
Quick Note:  I left voice message with results. ______ 

## 2012-10-07 ENCOUNTER — Telehealth: Payer: Self-pay | Admitting: Family Medicine

## 2012-10-07 NOTE — Telephone Encounter (Signed)
Pt called to check on status of some paperwork that pt faxed 2 days ago. Pt said that the original forms from a few wks ago were misplaced. Pt said that this paperwork had to be completed,signed by Dr Clent Ridges and sent back to [facility] by today. Pls call pt asap today to verify that this has been done.

## 2012-10-07 NOTE — Telephone Encounter (Signed)
I spoke with pt and form was faxed on 10/06/12.

## 2012-11-09 ENCOUNTER — Ambulatory Visit (INDEPENDENT_AMBULATORY_CARE_PROVIDER_SITE_OTHER): Payer: Managed Care, Other (non HMO) | Admitting: Family Medicine

## 2012-11-09 DIAGNOSIS — Z23 Encounter for immunization: Secondary | ICD-10-CM

## 2012-11-17 ENCOUNTER — Encounter: Payer: Managed Care, Other (non HMO) | Admitting: Family Medicine

## 2012-12-08 ENCOUNTER — Encounter: Payer: Self-pay | Admitting: Family Medicine

## 2012-12-08 ENCOUNTER — Ambulatory Visit (INDEPENDENT_AMBULATORY_CARE_PROVIDER_SITE_OTHER): Payer: Managed Care, Other (non HMO) | Admitting: Family Medicine

## 2012-12-08 VITALS — BP 130/80 | HR 88 | Temp 98.2°F | Ht 71.0 in | Wt 254.0 lb

## 2012-12-08 DIAGNOSIS — Z0000 Encounter for general adult medical examination without abnormal findings: Secondary | ICD-10-CM

## 2012-12-08 MED ORDER — ALLOPURINOL 100 MG PO TABS: 100.0000 mg | ORAL_TABLET | Freq: Every day | ORAL | Status: DC

## 2012-12-08 MED ORDER — LISINOPRIL-HYDROCHLOROTHIAZIDE 20-25 MG PO TABS: 1.0000 | ORAL_TABLET | Freq: Every day | ORAL | Status: DC

## 2012-12-08 MED ORDER — ATORVASTATIN CALCIUM 40 MG PO TABS: 40.0000 mg | ORAL_TABLET | Freq: Every day | ORAL | Status: DC

## 2012-12-08 MED ORDER — SILDENAFIL CITRATE 100 MG PO TABS: 100.0000 mg | ORAL_TABLET | Freq: Every day | ORAL | Status: DC | PRN

## 2012-12-08 NOTE — Addendum Note (Signed)
Addended by: Aniceto Boss A on: 12/08/2012 01:21 PM   Modules accepted: Orders

## 2012-12-08 NOTE — Progress Notes (Signed)
  Subjective:    Patient ID: Ricky Meyers, male    DOB: 1946/09/27, 67 y.o.   MRN: [id number]  HPI 67 yr old male for a cpx. He feels well and has no concerns. His labs recently showed elevations in his glucose, LDl, and TG. He admits to not eating well and not exercising.    Review of Systems  Constitutional: Negative.   HENT: Negative.   Eyes: Negative.   Respiratory: Negative.   Cardiovascular: Negative.   Gastrointestinal: Negative.   Genitourinary: Negative.   Musculoskeletal: Negative.   Skin: Negative.   Neurological: Negative.   Hematological: Negative.   Psychiatric/Behavioral: Negative.        Objective:   Physical Exam  Constitutional: He is oriented to person, place, and time. He appears well-developed and well-nourished. No distress.       Morbidly obese  HENT:  Head: Normocephalic and atraumatic.  Right Ear: External ear normal.  Left Ear: External ear normal.  Nose: Nose normal.  Mouth/Throat: Oropharynx is clear and moist. No oropharyngeal exudate.  Eyes: Conjunctivae normal and EOM are normal. Pupils are equal, round, and reactive to light. Right eye exhibits no discharge. Left eye exhibits no discharge. No scleral icterus.  Neck: Neck supple. No JVD present. No tracheal deviation present. No thyromegaly present.  Cardiovascular: Normal rate, regular rhythm, normal heart sounds and intact distal pulses.  Exam reveals no gallop and no friction rub.   No murmur heard.      EKG shows stable LBBB   Pulmonary/Chest: Effort normal and breath sounds normal. No respiratory distress. He has no wheezes. He has no rales. He exhibits no tenderness.  Abdominal: Soft. Bowel sounds are normal. He exhibits no distension and no mass. There is no tenderness. There is no rebound and no guarding.  Genitourinary: Rectum normal and penis normal. Guaiac negative stool. No penile tenderness.       Prostate is surgically absent   Musculoskeletal: Normal range of motion. He  exhibits no edema and no tenderness.  Lymphadenopathy:    He has no cervical adenopathy.  Neurological: He is alert and oriented to person, place, and time. He has normal reflexes. No cranial nerve deficit. He exhibits normal muscle tone. Coordination normal.  Skin: Skin is warm and dry. No rash noted. He is not diaphoretic. No erythema. No pallor.  Psychiatric: He has a normal mood and affect. His behavior is normal. Judgment and thought content normal.          Assessment & Plan:  Well exam. We will switch from Zocor to Lipitor. He needs to follow a strict diet, exercise more, and lose weight. Recheck in 90 days

## 2012-12-08 NOTE — Addendum Note (Signed)
Addended by: Aniceto Boss A on: 12/08/2012 11:59 AM   Modules accepted: Orders

## 2013-01-18 ENCOUNTER — Telehealth: Payer: Self-pay | Admitting: Family Medicine

## 2013-01-18 MED ORDER — PAROXETINE HCL 20 MG PO TABS: 20.0000 mg | ORAL_TABLET | ORAL | Status: DC

## 2013-01-18 NOTE — Telephone Encounter (Signed)
Please take care of this.  

## 2013-01-18 NOTE — Telephone Encounter (Signed)
Patient came in stating that he need a 30day refill of paroxetine 20 mg 1poqd in am sent to [facility] and he would like to have his 90 day refill sent to [facility]. Please assist.

## 2013-01-18 NOTE — Telephone Encounter (Signed)
I called in the 30 day supply locally and also sent in the 90 day supply to [facility].

## 2013-10-07 ENCOUNTER — Other Ambulatory Visit (INDEPENDENT_AMBULATORY_CARE_PROVIDER_SITE_OTHER): Payer: Managed Care, Other (non HMO)

## 2013-10-07 DIAGNOSIS — Z0000 Encounter for general adult medical examination without abnormal findings: Secondary | ICD-10-CM

## 2013-10-07 LAB — TSH: TSH: 1.25 u[IU]/mL (ref 0.35–5.50)

## 2013-10-07 LAB — CBC WITH DIFFERENTIAL/PLATELET
Basophils Absolute: 0 10*3/uL (ref 0.0–0.1)
HCT: 44.3 % (ref 39.0–52.0)
Hemoglobin: 15 g/dL (ref 13.0–17.0)
Lymphs Abs: 1.8 10*3/uL (ref 0.7–4.0)
MCHC: 33.8 g/dL (ref 30.0–36.0)
MCV: 93.3 fl (ref 78.0–100.0)
Monocytes Absolute: 0.8 10*3/uL (ref 0.1–1.0)
Monocytes Relative: 11.3 % (ref 3.0–12.0)
Neutro Abs: 4.2 10*3/uL (ref 1.4–7.7)
RDW: 14.3 % (ref 11.5–14.6)

## 2013-10-07 LAB — HEPATIC FUNCTION PANEL
ALT: 37 U/L (ref 0–53)
AST: 30 U/L (ref 0–37)
Albumin: 4 g/dL (ref 3.5–5.2)

## 2013-10-07 LAB — BASIC METABOLIC PANEL
BUN: 19 mg/dL (ref 6–23)
CO2: 30 mEq/L (ref 19–32)
Chloride: 102 mEq/L (ref 96–112)
GFR: 86.52 mL/min (ref >60.00)
Glucose, Bld: 102 mg/dL — ABNORMAL HIGH (ref 70–99)
Potassium: 4.4 mEq/L (ref 3.5–5.1)
Sodium: 138 mEq/L (ref 135–145)

## 2013-10-07 LAB — POCT URINALYSIS DIPSTICK
Bilirubin, UA: NEGATIVE
Blood, UA: NEGATIVE
Glucose, UA: NEGATIVE
Ketones, UA: NEGATIVE
Nitrite, UA: NEGATIVE
Spec Grav, UA: 1.02
Urobilinogen, UA: 0.2

## 2013-10-07 LAB — LIPID PANEL
Cholesterol: 181 mg/dL (ref 0–200)
HDL: 61.9 mg/dL (ref >39.00)
Total CHOL/HDL Ratio: 3
Triglycerides: 99 mg/dL (ref 0.0–149.0)

## 2013-10-12 ENCOUNTER — Ambulatory Visit: Payer: Managed Care, Other (non HMO)

## 2013-10-19 ENCOUNTER — Ambulatory Visit: Payer: Managed Care, Other (non HMO)

## 2013-10-21 ENCOUNTER — Other Ambulatory Visit: Payer: Managed Care, Other (non HMO)

## 2013-11-08 ENCOUNTER — Other Ambulatory Visit: Payer: Self-pay | Admitting: Family Medicine

## 2013-11-09 ENCOUNTER — Ambulatory Visit: Payer: Managed Care, Other (non HMO)

## 2013-11-15 ENCOUNTER — Telehealth: Payer: Self-pay | Admitting: Family Medicine

## 2013-11-15 NOTE — Telephone Encounter (Signed)
Refill request for Prinzide, Paxil, Zocor, Atorvastatin send in a 90 day supply to [facility]. Can we refill these?

## 2013-11-16 MED ORDER — LISINOPRIL-HYDROCHLOROTHIAZIDE 20-25 MG PO TABS: 1.0000 | ORAL_TABLET | Freq: Every day | ORAL | Status: DC

## 2013-11-16 MED ORDER — PAROXETINE HCL 20 MG PO TABS: 20.0000 mg | ORAL_TABLET | ORAL | Status: DC

## 2013-11-16 MED ORDER — ATORVASTATIN CALCIUM 40 MG PO TABS: 40.0000 mg | ORAL_TABLET | Freq: Every day | ORAL | Status: DC

## 2013-11-16 NOTE — Telephone Encounter (Signed)
[facility] states they have not received refill as of yet, sent in on 1/07.  Advised pharm to relay to pt he needs appt. 30 day only

## 2013-11-16 NOTE — Telephone Encounter (Signed)
I sent all 3 scripts e-scribe and spoke with pharmacy.

## 2013-11-16 NOTE — Telephone Encounter (Signed)
He is on Lipitor but NOT Zocor. Call in 30 days of each of these locally. He needs an OV before we can give him any long term refills

## 2013-11-24 ENCOUNTER — Encounter: Payer: Self-pay | Admitting: Family Medicine

## 2013-11-24 ENCOUNTER — Ambulatory Visit (INDEPENDENT_AMBULATORY_CARE_PROVIDER_SITE_OTHER): Payer: Managed Care, Other (non HMO) | Admitting: Family Medicine

## 2013-11-24 VITALS — BP 130/76 | HR 89 | Temp 98.4°F | Ht 71.0 in | Wt 256.0 lb

## 2013-11-24 DIAGNOSIS — R51 Headache: Secondary | ICD-10-CM

## 2013-11-24 DIAGNOSIS — Z23 Encounter for immunization: Secondary | ICD-10-CM

## 2013-11-24 MED ORDER — DICLOFENAC SODIUM 75 MG PO TBEC: 75.0000 mg | DELAYED_RELEASE_TABLET | Freq: Two times a day (BID) | ORAL | Status: DC

## 2013-11-24 MED ORDER — CYCLOBENZAPRINE HCL 10 MG PO TABS: 10.0000 mg | ORAL_TABLET | Freq: Three times a day (TID) | ORAL | Status: DC | PRN

## 2013-11-24 NOTE — Progress Notes (Signed)
Pre visit review using our clinic review tool, if applicable. No additional management support is needed unless otherwise documented below in the visit note. 

## 2013-11-24 NOTE — Addendum Note (Signed)
Addended by: Aggie Hacker A on: 11/24/2013 11:25 AM   Modules accepted: Orders

## 2013-11-24 NOTE — Progress Notes (Signed)
   Subjective:    Patient ID: Ricky Meyers, male    DOB: 1946-05-14, 68 y.o.   MRN: [id number]  HPI Here for neck pain and HAs. These started about 6 months ago but they are getting worse. He has constant stiffness and dull pain in the neck and at times this spreads up the back of the head. Aleve helps a little. No neurologic deficits.    Review of Systems  Constitutional: Negative.   Musculoskeletal: Positive for neck pain and neck stiffness.  Neurological: Positive for headaches. Negative for dizziness, tremors, seizures, syncope, facial asymmetry, speech difficulty, weakness, light-headedness and numbness.       Objective:   Physical Exam  Constitutional: He is oriented to person, place, and time. He appears well-developed and well-nourished.  Musculoskeletal:  He is mildly tender in the posterior neck with very limited ROM   Neurological: He is alert and oriented to person, place, and time. No cranial nerve deficit.          Assessment & Plan:  These are tension HAs probably stemming form some arthritis in the cervical spine. Try heat, Flexeril, and Diclofenac. He will do stretching exercises. Recheck prn

## 2013-12-09 ENCOUNTER — Other Ambulatory Visit: Payer: Managed Care, Other (non HMO)

## 2013-12-16 ENCOUNTER — Encounter: Payer: Self-pay | Admitting: Family Medicine

## 2013-12-16 ENCOUNTER — Ambulatory Visit (INDEPENDENT_AMBULATORY_CARE_PROVIDER_SITE_OTHER): Payer: Managed Care, Other (non HMO) | Admitting: Family Medicine

## 2013-12-16 VITALS — BP 136/70 | HR 89 | Temp 98.2°F | Ht 70.5 in | Wt 256.0 lb

## 2013-12-16 DIAGNOSIS — Z0000 Encounter for general adult medical examination without abnormal findings: Secondary | ICD-10-CM

## 2013-12-16 DIAGNOSIS — E785 Hyperlipidemia, unspecified: Secondary | ICD-10-CM

## 2013-12-16 MED ORDER — SILDENAFIL CITRATE 100 MG PO TABS: 100.0000 mg | ORAL_TABLET | Freq: Every day | ORAL | Status: DC | PRN

## 2013-12-16 MED ORDER — PAROXETINE HCL 20 MG PO TABS: 20.0000 mg | ORAL_TABLET | ORAL | Status: DC

## 2013-12-16 MED ORDER — DICLOFENAC SODIUM 75 MG PO TBEC: 75.0000 mg | DELAYED_RELEASE_TABLET | Freq: Two times a day (BID) | ORAL | Status: DC

## 2013-12-16 MED ORDER — CYCLOBENZAPRINE HCL 10 MG PO TABS: 10.0000 mg | ORAL_TABLET | Freq: Three times a day (TID) | ORAL | Status: DC | PRN

## 2013-12-16 MED ORDER — LISINOPRIL-HYDROCHLOROTHIAZIDE 20-25 MG PO TABS: 1.0000 | ORAL_TABLET | Freq: Every day | ORAL | Status: DC

## 2013-12-16 MED ORDER — ALLOPURINOL 100 MG PO TABS: 100.0000 mg | ORAL_TABLET | Freq: Every day | ORAL | Status: DC

## 2013-12-16 MED ORDER — ATORVASTATIN CALCIUM 40 MG PO TABS: 40.0000 mg | ORAL_TABLET | Freq: Every day | ORAL | Status: DC

## 2013-12-16 NOTE — Progress Notes (Signed)
   Subjective:    Patient ID: Ricky Meyers, male    DOB: 09/04/1946, 68 y.o.   MRN: [id number]  HPI 68 yr old male for a cpx. He feels well.    Review of Systems  Constitutional: Negative.   HENT: Negative.   Eyes: Negative.   Respiratory: Negative.   Cardiovascular: Negative.   Gastrointestinal: Negative.   Genitourinary: Negative.   Musculoskeletal: Negative.   Skin: Negative.   Neurological: Negative.   Psychiatric/Behavioral: Negative.        Objective:   Physical Exam  Constitutional: He is oriented to person, place, and time. He appears well-developed and well-nourished. No distress.  HENT:  Head: Normocephalic and atraumatic.  Right Ear: External ear normal.  Left Ear: External ear normal.  Nose: Nose normal.  Mouth/Throat: Oropharynx is clear and moist. No oropharyngeal exudate.  Eyes: Conjunctivae and EOM are normal. Pupils are equal, round, and reactive to light. Right eye exhibits no discharge. Left eye exhibits no discharge. No scleral icterus.  Neck: Neck supple. No JVD present. No tracheal deviation present. No thyromegaly present.  Cardiovascular: Normal rate, regular rhythm, normal heart sounds and intact distal pulses.  Exam reveals no gallop and no friction rub.   No murmur heard. EKG is at his baseline with LBBB and occasional PVCs  Pulmonary/Chest: Effort normal and breath sounds normal. No respiratory distress. He has no wheezes. He has no rales. He exhibits no tenderness.  Abdominal: Soft. Bowel sounds are normal. He exhibits no distension and no mass. There is no tenderness. There is no rebound and no guarding.  Genitourinary: Rectum normal, prostate normal and penis normal. Guaiac negative stool. No penile tenderness.  Musculoskeletal: Normal range of motion. He exhibits no edema and no tenderness.  Lymphadenopathy:    He has no cervical adenopathy.  Neurological: He is alert and oriented to person, place, and time. He has normal reflexes. No  cranial nerve deficit. He exhibits normal muscle tone. Coordination normal.  Skin: Skin is warm and dry. No rash noted. He is not diaphoretic. No erythema. No pallor.  Psychiatric: He has a normal mood and affect. His behavior is normal. Judgment and thought content normal.          Assessment & Plan:  Well exam. He needs to lose weight.

## 2013-12-16 NOTE — Progress Notes (Signed)
Pre visit review using our clinic review tool, if applicable. No additional management support is needed unless otherwise documented below in the visit note. 

## 2014-01-10 ENCOUNTER — Other Ambulatory Visit: Payer: Self-pay | Admitting: Family Medicine

## 2014-01-10 NOTE — Telephone Encounter (Signed)
Pt had cpx 12/2013.

## 2014-08-18 ENCOUNTER — Emergency Department (HOSPITAL_COMMUNITY): Payer: Managed Care, Other (non HMO)

## 2014-08-18 ENCOUNTER — Encounter (HOSPITAL_COMMUNITY): Payer: Self-pay | Admitting: Emergency Medicine

## 2014-08-18 ENCOUNTER — Emergency Department (HOSPITAL_COMMUNITY)
Admission: EM | Admit: 2014-08-18 | Discharge: 2014-08-18 | Disposition: A | Discharge status: Home / Self Care | Payer: Managed Care, Other (non HMO) | Attending: Emergency Medicine | Admitting: Emergency Medicine

## 2014-08-18 DIAGNOSIS — E785 Hyperlipidemia, unspecified: Secondary | ICD-10-CM | POA: Insufficient documentation

## 2014-08-18 DIAGNOSIS — Z791 Long term (current) use of non-steroidal anti-inflammatories (NSAID): Secondary | ICD-10-CM | POA: Insufficient documentation

## 2014-08-18 DIAGNOSIS — T17208A Unspecified foreign body in pharynx causing other injury, initial encounter: Secondary | ICD-10-CM | POA: Diagnosis not present

## 2014-08-18 DIAGNOSIS — I10 Essential (primary) hypertension: Secondary | ICD-10-CM | POA: Diagnosis not present

## 2014-08-18 DIAGNOSIS — Z8546 Personal history of malignant neoplasm of prostate: Secondary | ICD-10-CM | POA: Diagnosis not present

## 2014-08-18 DIAGNOSIS — Z87448 Personal history of other diseases of urinary system: Secondary | ICD-10-CM | POA: Diagnosis not present

## 2014-08-18 DIAGNOSIS — F329 Major depressive disorder, single episode, unspecified: Secondary | ICD-10-CM | POA: Insufficient documentation

## 2014-08-18 DIAGNOSIS — G8929 Other chronic pain: Secondary | ICD-10-CM | POA: Diagnosis not present

## 2014-08-18 DIAGNOSIS — Z79899 Other long term (current) drug therapy: Secondary | ICD-10-CM | POA: Diagnosis not present

## 2014-08-18 DIAGNOSIS — R05 Cough: Secondary | ICD-10-CM | POA: Diagnosis not present

## 2014-08-18 DIAGNOSIS — T18120A Food in esophagus causing compression of trachea, initial encounter: Secondary | ICD-10-CM | POA: Diagnosis not present

## 2014-08-18 DIAGNOSIS — T18128A Food in esophagus causing other injury, initial encounter: Secondary | ICD-10-CM

## 2014-08-18 DIAGNOSIS — M109 Gout, unspecified: Secondary | ICD-10-CM | POA: Insufficient documentation

## 2014-08-18 DIAGNOSIS — R070 Pain in throat: Secondary | ICD-10-CM | POA: Diagnosis not present

## 2014-08-18 LAB — CBC WITH DIFFERENTIAL/PLATELET
Basophils Absolute: 0 10*3/uL (ref 0.0–0.1)
Basophils Relative: 0 % (ref 0–1)
EOS ABS: 0.1 10*3/uL (ref 0.0–0.7)
EOS PCT: 1 % (ref 0–5)
HCT: 43 % (ref 39.0–52.0)
Hemoglobin: 14.7 g/dL (ref 13.0–17.0)
Lymphocytes Relative: 18 % (ref 12–46)
Lymphs Abs: 1.6 10*3/uL (ref 0.7–4.0)
MCH: 32 pg (ref 26.0–34.0)
MCHC: 34.2 g/dL (ref 30.0–36.0)
MCV: 93.5 fL (ref 78.0–100.0)
Monocytes Absolute: 0.7 10*3/uL (ref 0.1–1.0)
Monocytes Relative: 8 % (ref 3–12)
NEUTROS PCT: 73 % (ref 43–77)
Neutro Abs: 6.3 10*3/uL (ref 1.7–7.7)
PLATELETS: 204 10*3/uL (ref 150–400)
RBC: 4.6 MIL/uL (ref 4.22–5.81)
RDW: 14.1 % (ref 11.5–15.5)
WBC: 8.7 10*3/uL (ref 4.0–10.5)

## 2014-08-18 LAB — COMPREHENSIVE METABOLIC PANEL
ALK PHOS: 66 U/L (ref 39–117)
ALT: 37 U/L (ref 0–53)
AST: 30 U/L (ref 0–37)
Albumin: 4.8 g/dL (ref 3.5–5.2)
Anion gap: 16 — ABNORMAL HIGH (ref 5–15)
BUN: 22 mg/dL (ref 6–23)
CALCIUM: 10.1 mg/dL (ref 8.4–10.5)
CO2: 24 mEq/L (ref 19–32)
Chloride: 103 mEq/L (ref 96–112)
Creatinine, Ser: 0.99 mg/dL (ref 0.50–1.35)
GFR calc Af Amer: >90 mL/min (ref >90)
GFR calc non Af Amer: 81 mL/min — ABNORMAL LOW (ref >90)
Glucose, Bld: 110 mg/dL — ABNORMAL HIGH (ref 70–99)
POTASSIUM: 4.4 meq/L (ref 3.7–5.3)
SODIUM: 143 meq/L (ref 137–147)
TOTAL PROTEIN: 8.3 g/dL (ref 6.0–8.3)
Total Bilirubin: 0.6 mg/dL (ref 0.3–1.2)

## 2014-08-18 MED ORDER — DIPHENHYDRAMINE HCL 50 MG/ML IJ SOLN
25.0000 mg | Freq: Once | INTRAMUSCULAR | Status: AC
  Administered 2014-08-18: 50 mg via INTRAVENOUS
  Filled 2014-08-18: qty 1

## 2014-08-18 MED ORDER — GI COCKTAIL ~~LOC~~
30.0000 mL | Freq: Once | ORAL | Status: AC
  Administered 2014-08-18: 30 mL via ORAL
  Filled 2014-08-18: qty 30

## 2014-08-18 MED ORDER — GLUCAGON HCL RDNA (DIAGNOSTIC) 1 MG IJ SOLR
1.0000 mg | Freq: Once | INTRAMUSCULAR | Status: AC
  Administered 2014-08-18: 1 mg via INTRAVENOUS
  Filled 2014-08-18: qty 1

## 2014-08-18 MED ORDER — SODIUM CHLORIDE 0.9 % IV BOLUS (SEPSIS)
1000.0000 mL | Freq: Once | INTRAVENOUS | Status: AC
  Administered 2014-08-18: 1000 mL via INTRAVENOUS

## 2014-08-18 MED ORDER — METOCLOPRAMIDE HCL 5 MG/ML IJ SOLN
10.0000 mg | Freq: Once | INTRAMUSCULAR | Status: AC
  Administered 2014-08-18: 10 mg via INTRAVENOUS
  Filled 2014-08-18: qty 2

## 2014-08-18 NOTE — ED Provider Notes (Signed)
CSN: [id number]     Arrival date & time 08/18/14  6301 History   First MD Initiated Contact with Patient 08/18/14 [phone]     Chief Complaint  Patient presents with  . Foreign Body    steak in throat     (Consider location/radiation/quality/duration/timing/severity/associated sxs/prior Treatment) The history is provided by the patient.  GUMECINDO HOPKIN is a 68 y.o. male hx of HTN, HL, prostate cancer here with possible food impaction. He ate steak around 9pm yesterday and felt that it got stuck in his throat. He tried water afterwards but was unable to keep it down. He was unable to keep anything down afterwards. He an episode of food impaction 20 years ago.    Past Medical History  Diagnosis Date  . Gout   . Hyperlipidemia   . Hypertension   . Depression   . Prostate cancer     history of  . Chronic neck pain   . ED (erectile dysfunction)    Past Surgical History  Procedure Laterality Date  . Prostatectomy    . Tonsillectomy    . Vasectomy    . Colonoscopy  12/22/06    per Dr. Ardis Hughs, repeat in 10 yrs  . Cervical fusion  12/02/07    per Dr. Sherley Bounds   Family History  Problem Relation Age of Onset  . Cancer Other     breast, porstate  . Hypertension Other   . Stroke Other    History  Substance Use Topics  . Smoking status: Never Smoker   . Smokeless tobacco: Never Used  . Alcohol Use: 0.0 oz/week     Comment: occ    Review of Systems  HENT:       Food stuck in throat   All other systems reviewed and are negative.     Allergies  Review of patient's allergies indicates no known allergies.  Home Medications   Prior to Admission medications   Medication Sig Start Date End Date Taking? Authorizing Provider  allopurinol (ZYLOPRIM) 100 MG tablet Take 1 tablet (100 mg total) by mouth daily. 12/16/13  Yes [name], MD  atorvastatin (LIPITOR) 40 MG tablet Take 1 tablet (40 mg total) by mouth daily. 12/16/13  Yes [name], MD  diclofenac (VOLTAREN) 75  MG EC tablet Take 1 tablet (75 mg total) by mouth 2 (two) times daily. 12/16/13  Yes [name], MD  lisinopril-hydrochlorothiazide (PRINZIDE,ZESTORETIC) 20-25 MG per tablet Take 1 tablet by mouth daily. 12/16/13 12/16/14 Yes [name], MD  PARoxetine (PAXIL) 20 MG tablet Take 1 tablet (20 mg total) by mouth every morning. 12/16/13  Yes [name], MD   BP 132/72  Pulse 88  Temp(Src) 97.7 F (36.5 C) (Oral)  Resp 22  SpO2 98% Physical Exam  Nursing note and vitals reviewed. Constitutional: He is oriented to person, place, and time.  Uncomfortable   HENT:  Head: Normocephalic.  Mouth/Throat: Oropharynx is clear and moist.  No obvious foreign body   Eyes: Conjunctivae are normal. Pupils are equal, round, and reactive to light.  Neck: Normal range of motion. Neck supple.  No stridor   Cardiovascular: Normal rate, regular rhythm and normal heart sounds.   Pulmonary/Chest: Effort normal and breath sounds normal. No respiratory distress. He has no wheezes. He has no rales.  Abdominal: Soft. Bowel sounds are normal. He exhibits no distension. There is no tenderness. There is no rebound and no guarding.  Musculoskeletal: Normal range of motion. He  exhibits no edema and no tenderness.  Neurological: He is alert and oriented to person, place, and time. No cranial nerve deficit. Coordination normal.  Skin: Skin is warm and dry.  Psychiatric: He has a normal mood and affect. His behavior is normal. Judgment and thought content normal.    ED Course  Procedures (including critical care time) Labs Review Labs Reviewed  COMPREHENSIVE METABOLIC PANEL - Abnormal; Notable for the following:    Glucose, Bld 110 (*)    GFR calc non Af Amer 81 (*)    Anion gap 16 (*)    All other components within normal limits  CBC WITH DIFFERENTIAL    Imaging Review Dg Neck Soft Tissue  08/18/2014   CLINICAL DATA:  Throat pain. The patient choked last night on a piece of meat. Cough.  EXAM: NECK SOFT  TISSUES - 1+ VIEW  COMPARISON:  01/29/2008  FINDINGS: Tongue base, epiglottis, and prevertebral soft tissues are normal. No visible foreign body. Solid anterior fusion of the cervical spine from C3 to C5.  IMPRESSION: No acute abnormality.   Electronically Signed   By: Rozetta Nunnery M.D.   On: 08/18/2014 09:57   Dg Chest 2 View  08/18/2014   CLINICAL DATA:  Cough and throat pain, choked last night on piece of meat  EXAM: CHEST  2 VIEW  COMPARISON:  11/27/2007  FINDINGS: Cardiomediastinal silhouette is unremarkable. No acute infiltrate or pleural effusion. No pulmonary edema. No evidence of aspiration. Metallic fixation plate noted cervical spine. Mild degenerative changes lower thoracic spine. Stable mild compression deformity lower thoracic spine.  IMPRESSION: No active cardiopulmonary disease.   Electronically Signed   By: Lahoma Crocker M.D.   On: 08/18/2014 09:57     EKG Interpretation None      MDM   Final diagnoses:  None    JWAN HORNBAKER is a 68 y.o. male here with food impaction. Will try glucagon, reglan. If not improved, will likely need endoscopy.   11:41 AM He felt that he swallowed the steak. Able to tolerate GI cocktail and PO fluids. Will d/c home with outpatient GI f/u.     Wandra Arthurs, MD 08/18/14 [phone]

## 2014-08-18 NOTE — Discharge Instructions (Signed)
Stay hydrated.   Eat soft food for several days.   Follow up with your GI doctor for outpatient endoscopy  Return to ER if you have trouble swallowing, food stuck in esophagus again.

## 2014-08-18 NOTE — ED Notes (Signed)
Pt sts he ate steak last night around 9pm and steak would not go down. Pt sts he has trouble swallowing and can not drink water because the water comes back up. Pt denies SOB, coughing, abd pain.

## 2014-08-30 DIAGNOSIS — R030 Elevated blood-pressure reading, without diagnosis of hypertension: Secondary | ICD-10-CM | POA: Diagnosis not present

## 2014-08-30 DIAGNOSIS — M4696 Unspecified inflammatory spondylopathy, lumbar region: Secondary | ICD-10-CM | POA: Diagnosis not present

## 2014-08-30 DIAGNOSIS — Z6834 Body mass index (BMI) 34.0-34.9, adult: Secondary | ICD-10-CM | POA: Diagnosis not present

## 2014-08-30 DIAGNOSIS — M47816 Spondylosis without myelopathy or radiculopathy, lumbar region: Secondary | ICD-10-CM | POA: Diagnosis not present

## 2014-09-27 DIAGNOSIS — Z6834 Body mass index (BMI) 34.0-34.9, adult: Secondary | ICD-10-CM | POA: Diagnosis not present

## 2014-09-27 DIAGNOSIS — M25552 Pain in left hip: Secondary | ICD-10-CM | POA: Diagnosis not present

## 2014-09-27 DIAGNOSIS — M47816 Spondylosis without myelopathy or radiculopathy, lumbar region: Secondary | ICD-10-CM | POA: Diagnosis not present

## 2014-09-27 DIAGNOSIS — M4696 Unspecified inflammatory spondylopathy, lumbar region: Secondary | ICD-10-CM | POA: Diagnosis not present

## 2014-09-27 DIAGNOSIS — M25551 Pain in right hip: Secondary | ICD-10-CM | POA: Diagnosis not present

## 2014-09-27 DIAGNOSIS — M545 Low back pain: Secondary | ICD-10-CM | POA: Diagnosis not present

## 2014-10-13 DIAGNOSIS — M545 Low back pain: Secondary | ICD-10-CM | POA: Diagnosis not present

## 2014-11-09 DIAGNOSIS — M545 Low back pain: Secondary | ICD-10-CM | POA: Diagnosis not present

## 2014-11-16 ENCOUNTER — Ambulatory Visit (INDEPENDENT_AMBULATORY_CARE_PROVIDER_SITE_OTHER): Payer: Medicare Other | Admitting: Family Medicine

## 2014-11-16 ENCOUNTER — Encounter: Payer: Self-pay | Admitting: Family Medicine

## 2014-11-16 VITALS — BP 108/64 | HR 92 | Temp 97.6°F | Ht 70.5 in | Wt 254.0 lb

## 2014-11-16 DIAGNOSIS — M5090 Cervical disc disorder, unspecified, unspecified cervical region: Secondary | ICD-10-CM

## 2014-11-16 DIAGNOSIS — Z23 Encounter for immunization: Secondary | ICD-10-CM

## 2014-11-16 MED ORDER — HYDROMORPHONE HCL 8 MG PO TABS: 8.0000 mg | ORAL_TABLET | ORAL | Status: DC | PRN

## 2014-11-16 NOTE — Addendum Note (Signed)
Addended by: Aggie Hacker A on: 11/16/2014 10:51 AM   Modules accepted: Orders

## 2014-11-16 NOTE — Progress Notes (Signed)
   Subjective:    Patient ID: Ricky Meyers, male    DOB: 02/06/1946, 69 y.o.   MRN: [id number]  HPI Here for severe right sided posterior neck pains that radiate up to the back of his head. He  had a cervical fusion surgery in the past per Dr. Sherley Bounds, and this neck pain has persisted for the past year. However this week it is worse than ever before. He is seeing Dr. Maryjean Ka for pain management but they have been focusing on the lower back. They recently put him on Relafen, Gabapentin and Oxycodone, but this has not helped the pain at all.    Review of Systems  Constitutional: Negative.   Musculoskeletal: Positive for neck pain and neck stiffness.  Neurological: Positive for headaches. Negative for dizziness, tremors, seizures, syncope, facial asymmetry, speech difficulty, weakness, light-headedness and numbness.       Objective:   Physical Exam  Constitutional: He is oriented to person, place, and time. He appears well-developed and well-nourished.  Musculoskeletal:  ROM of th neck is very limited and he guards against me moving his neck. He is very tender on the right posterior neck just under the skull.   Neurological: He is alert and oriented to person, place, and time. No cranial nerve deficit. Coordination normal.          Assessment & Plan:  He has a pinched cervical nerve, most likely due to a herniated disc. We will do a stat referral for him to see Dr. Ronnald Ramp this week. Try Dilaudid for the pain.

## 2014-11-16 NOTE — Progress Notes (Signed)
Pre visit review using our clinic review tool, if applicable. No additional management support is needed unless otherwise documented below in the visit note. 

## 2014-11-18 ENCOUNTER — Telehealth: Payer: Self-pay | Admitting: Family Medicine

## 2014-11-18 NOTE — Telephone Encounter (Addendum)
Pt states you were going to assist in getting him in w/ dr Ronnald Ramp. Pt has not heard anything and wanted to let you know.

## 2014-11-18 NOTE — Telephone Encounter (Signed)
I did this stat referral the day I saw him. He would need to call Dr. Ronnald Ramp' office to check on the appt

## 2014-11-18 NOTE — Telephone Encounter (Signed)
Pt was supposed to be seen this week per dr fry.  Dr fry was going to help set this up so pt could be seen. pls advise

## 2014-11-21 DIAGNOSIS — Z6834 Body mass index (BMI) 34.0-34.9, adult: Secondary | ICD-10-CM | POA: Diagnosis not present

## 2014-11-21 DIAGNOSIS — M47816 Spondylosis without myelopathy or radiculopathy, lumbar region: Secondary | ICD-10-CM | POA: Diagnosis not present

## 2014-11-21 DIAGNOSIS — M542 Cervicalgia: Secondary | ICD-10-CM | POA: Diagnosis not present

## 2014-11-21 NOTE — Telephone Encounter (Signed)
I spoke with pt and he has appointment today.

## 2014-11-22 DIAGNOSIS — M542 Cervicalgia: Secondary | ICD-10-CM | POA: Diagnosis not present

## 2014-11-22 DIAGNOSIS — M4696 Unspecified inflammatory spondylopathy, lumbar region: Secondary | ICD-10-CM | POA: Diagnosis not present

## 2014-11-22 DIAGNOSIS — Z6834 Body mass index (BMI) 34.0-34.9, adult: Secondary | ICD-10-CM | POA: Diagnosis not present

## 2014-11-22 DIAGNOSIS — M545 Low back pain: Secondary | ICD-10-CM | POA: Diagnosis not present

## 2014-11-22 DIAGNOSIS — M25551 Pain in right hip: Secondary | ICD-10-CM | POA: Diagnosis not present

## 2014-11-22 DIAGNOSIS — M47816 Spondylosis without myelopathy or radiculopathy, lumbar region: Secondary | ICD-10-CM | POA: Diagnosis not present

## 2014-11-29 ENCOUNTER — Other Ambulatory Visit: Payer: Self-pay | Admitting: Neurological Surgery

## 2014-11-29 DIAGNOSIS — M47816 Spondylosis without myelopathy or radiculopathy, lumbar region: Secondary | ICD-10-CM

## 2014-11-29 DIAGNOSIS — M542 Cervicalgia: Secondary | ICD-10-CM

## 2014-11-30 ENCOUNTER — Encounter (HOSPITAL_COMMUNITY): Payer: Self-pay | Admitting: Emergency Medicine

## 2014-11-30 ENCOUNTER — Emergency Department (HOSPITAL_COMMUNITY): Payer: Medicare Other

## 2014-11-30 ENCOUNTER — Emergency Department (HOSPITAL_COMMUNITY)
Admission: EM | Admit: 2014-11-30 | Discharge: 2014-11-30 | Disposition: A | Discharge status: Home / Self Care | Payer: Medicare Other | Source: Home / Self Care | Attending: Emergency Medicine | Admitting: Emergency Medicine

## 2014-11-30 DIAGNOSIS — M62830 Muscle spasm of back: Secondary | ICD-10-CM | POA: Diagnosis not present

## 2014-11-30 DIAGNOSIS — M4802 Spinal stenosis, cervical region: Secondary | ICD-10-CM | POA: Diagnosis not present

## 2014-11-30 DIAGNOSIS — E871 Hypo-osmolality and hyponatremia: Secondary | ICD-10-CM | POA: Diagnosis not present

## 2014-11-30 DIAGNOSIS — A419 Sepsis, unspecified organism: Secondary | ICD-10-CM | POA: Diagnosis not present

## 2014-11-30 DIAGNOSIS — R52 Pain, unspecified: Secondary | ICD-10-CM

## 2014-11-30 DIAGNOSIS — G8929 Other chronic pain: Secondary | ICD-10-CM | POA: Diagnosis not present

## 2014-11-30 DIAGNOSIS — M62838 Other muscle spasm: Secondary | ICD-10-CM

## 2014-11-30 DIAGNOSIS — M5412 Radiculopathy, cervical region: Secondary | ICD-10-CM | POA: Diagnosis not present

## 2014-11-30 DIAGNOSIS — F329 Major depressive disorder, single episode, unspecified: Secondary | ICD-10-CM | POA: Diagnosis not present

## 2014-11-30 DIAGNOSIS — R509 Fever, unspecified: Secondary | ICD-10-CM | POA: Diagnosis not present

## 2014-11-30 DIAGNOSIS — I10 Essential (primary) hypertension: Secondary | ICD-10-CM | POA: Diagnosis not present

## 2014-11-30 DIAGNOSIS — M11221 Other chondrocalcinosis, right elbow: Secondary | ICD-10-CM | POA: Diagnosis not present

## 2014-11-30 DIAGNOSIS — E785 Hyperlipidemia, unspecified: Secondary | ICD-10-CM | POA: Diagnosis not present

## 2014-11-30 DIAGNOSIS — M7989 Other specified soft tissue disorders: Secondary | ICD-10-CM | POA: Diagnosis not present

## 2014-11-30 DIAGNOSIS — M25521 Pain in right elbow: Secondary | ICD-10-CM | POA: Diagnosis not present

## 2014-11-30 HISTORY — DX: Unspecified osteoarthritis, unspecified site: M19.90

## 2014-11-30 LAB — CBC
HCT: 42 % (ref 39.0–52.0)
Hemoglobin: 14.5 g/dL (ref 13.0–17.0)
MCH: 30.9 pg (ref 26.0–34.0)
MCHC: 34.5 g/dL (ref 30.0–36.0)
MCV: 89.6 fL (ref 78.0–100.0)
PLATELETS: 201 10*3/uL (ref 150–400)
RBC: 4.69 MIL/uL (ref 4.22–5.81)
RDW: 13.4 % (ref 11.5–15.5)
WBC: 11.4 10*3/uL — AB (ref 4.0–10.5)

## 2014-11-30 LAB — COMPREHENSIVE METABOLIC PANEL
ALT: 27 U/L (ref 0–53)
ANION GAP: 5 (ref 5–15)
AST: 25 U/L (ref 0–37)
Albumin: 3.9 g/dL (ref 3.5–5.2)
Alkaline Phosphatase: 102 U/L (ref 39–117)
BUN: 16 mg/dL (ref 6–23)
CHLORIDE: 96 mmol/L (ref 96–112)
CO2: 31 mmol/L (ref 19–32)
CREATININE: 1.24 mg/dL (ref 0.50–1.35)
Calcium: 9.2 mg/dL (ref 8.4–10.5)
GFR, EST AFRICAN AMERICAN: 66 mL/min — AB (ref >90)
GFR, EST NON AFRICAN AMERICAN: 57 mL/min — AB (ref >90)
Glucose, Bld: 128 mg/dL — ABNORMAL HIGH (ref 70–99)
Potassium: 3.9 mmol/L (ref 3.5–5.1)
Sodium: 132 mmol/L — ABNORMAL LOW (ref 135–145)
TOTAL PROTEIN: 7.3 g/dL (ref 6.0–8.3)
Total Bilirubin: 0.9 mg/dL (ref 0.3–1.2)

## 2014-11-30 LAB — SEDIMENTATION RATE: Sed Rate: 34 mm/hr — ABNORMAL HIGH (ref 0–16)

## 2014-11-30 LAB — I-STAT TROPONIN, ED: Troponin i, poc: 0 ng/mL (ref 0.00–0.08)

## 2014-11-30 LAB — C-REACTIVE PROTEIN: CRP: 10.6 mg/dL — ABNORMAL HIGH (ref <0.60)

## 2014-11-30 MED ORDER — OXYCODONE-ACETAMINOPHEN 5-325 MG PO TABS
2.0000 | ORAL_TABLET | Freq: Once | ORAL | Status: AC
  Administered 2014-11-30: 2 via ORAL
  Filled 2014-11-30: qty 2

## 2014-11-30 MED ORDER — HYDROMORPHONE HCL 1 MG/ML IJ SOLN
1.0000 mg | Freq: Once | INTRAMUSCULAR | Status: AC
  Administered 2014-11-30: 1 mg via INTRAVENOUS
  Filled 2014-11-30: qty 1

## 2014-11-30 MED ORDER — DIAZEPAM 5 MG PO TABS: 10.0000 mg | ORAL_TABLET | Freq: Two times a day (BID) | ORAL | Status: DC | PRN

## 2014-11-30 MED ORDER — LORAZEPAM 2 MG/ML IJ SOLN
1.0000 mg | Freq: Once | INTRAMUSCULAR | Status: AC
  Administered 2014-11-30: 1 mg via INTRAVENOUS
  Filled 2014-11-30: qty 1

## 2014-11-30 MED ORDER — DIAZEPAM 5 MG/ML IJ SOLN
5.0000 mg | Freq: Once | INTRAMUSCULAR | Status: AC
  Administered 2014-11-30: 5 mg via INTRAVENOUS
  Filled 2014-11-30: qty 2

## 2014-11-30 NOTE — ED Notes (Signed)
Pt reports excruciating left shoulder pain that started yesterday. Pt has hx of arthritis in back and neck, states he has been having pain in his back for a few weeks. Pt denies any injury or heavy lifting. States pain is worse with movement of arm but denies any radiation to neck, arm, or chest. Pt states he is supposed to get an MRI of neck and back 2wks from now. Pt rates pain 7/10, states pain is constant with intermittent sharp pains.

## 2014-11-30 NOTE — ED Provider Notes (Signed)
CSN: [id number]     Arrival date & time 11/30/14  0545 History   First MD Initiated Contact with Patient 11/30/14 [phone]     Chief Complaint  Patient presents with  . Shoulder Pain    left      (Consider location/radiation/quality/duration/timing/severity/associated sxs/prior Treatment) HPI Ricky Meyers is a 69 y.o. male with hx of chronic neck and back pain, gout, htn, presents to ED complaining of neck and upper back pain. Pt states he has had increased neck pain for about 2wks. Has been followed by Dr. Ronnald Ramp. Has MRI scheduled in 2 weeks. Has been taking dilaudid 8 mg every 4 hours for his chronic pain, as well as Neurontin and oxycodone 10 mg twice a day. He states he last took his medications yesterday. States that throughout the night pain worsened. States it comes and goes, sharp, unbearable. He denies pain radiating. No chest pain. No shortness of breath. No new injuries. No numbness or weakness in his extremities.  Past Medical History  Diagnosis Date  . Gout   . Hyperlipidemia   . Hypertension   . Depression   . Prostate cancer     history of  . Chronic neck pain   . ED (erectile dysfunction)   . Arthritis     neck and back    Past Surgical History  Procedure Laterality Date  . Prostatectomy    . Tonsillectomy    . Vasectomy    . Colonoscopy  12/22/06    per Dr. Ardis Hughs, repeat in 10 yrs  . Cervical fusion  12/02/07    per Dr. Sherley Bounds   Family History  Problem Relation Age of Onset  . Cancer Other     breast, porstate  . Hypertension Other   . Stroke Other    History  Substance Use Topics  . Smoking status: Never Smoker   . Smokeless tobacco: Never Used  . Alcohol Use: 0.0 oz/week    0 Not specified per week     Comment: occ    Review of Systems  Constitutional: Negative for fever and chills.  Respiratory: Negative for cough, chest tightness and shortness of breath.   Cardiovascular: Negative for chest pain, palpitations and leg swelling.   Gastrointestinal: Negative for nausea, vomiting, abdominal pain, diarrhea and abdominal distention.  Genitourinary: Negative for dysuria, urgency, frequency and hematuria.  Musculoskeletal: Positive for myalgias, back pain, arthralgias and neck pain. Negative for neck stiffness.  Skin: Negative for rash.  Allergic/Immunologic: Negative for immunocompromised state.  Neurological: Positive for headaches. Negative for dizziness, weakness, light-headedness and numbness.      Allergies  Review of patient's allergies indicates no known allergies.  Home Medications   Prior to Admission medications   Medication Sig Start Date End Date Taking? Authorizing Provider  allopurinol (ZYLOPRIM) 100 MG tablet Take 1 tablet (100 mg total) by mouth daily. 12/16/13   [name], MD  atorvastatin (LIPITOR) 40 MG tablet Take 1 tablet (40 mg total) by mouth daily. 12/16/13   [name], MD  cyclobenzaprine (FLEXERIL) 10 MG tablet Take 10 mg by mouth 3 (three) times daily as needed for muscle spasms.    Historical Provider, MD  diclofenac (VOLTAREN) 75 MG EC tablet Take 1 tablet (75 mg total) by mouth 2 (two) times daily. 12/16/13   [name], MD  gabapentin (NEURONTIN) 100 MG capsule Take 100 mg by mouth 2 (two) times daily.    Historical Provider, MD  HYDROmorphone (DILAUDID) 8 MG  tablet Take 1 tablet (8 mg total) by mouth every 4 (four) hours as needed for severe pain. 11/16/14   [name], MD  lisinopril-hydrochlorothiazide (PRINZIDE,ZESTORETIC) 20-25 MG per tablet Take 1 tablet by mouth daily. 12/16/13 12/16/14  [name], MD  nabumetone (RELAFEN) 500 MG tablet Take 500 mg by mouth 2 (two) times daily.    Historical Provider, MD  OxyCODONE (OXYCONTIN) 10 mg T12A 12 hr tablet Take 10 mg by mouth every 12 (twelve) hours.    Historical Provider, MD  PARoxetine (PAXIL) 20 MG tablet Take 1 tablet (20 mg total) by mouth every morning. 12/16/13   [name], MD   There were no vitals taken for this  visit. Physical Exam  Constitutional: He is oriented to person, place, and time. He appears well-developed and well-nourished. No distress.  HENT:  Head: Normocephalic.  Neck: Normal range of motion. Neck supple.  Cardiovascular: Normal rate, regular rhythm and normal heart sounds.   Pulmonary/Chest: Effort normal and breath sounds normal. No respiratory distress. He has no wheezes. He has no rales.  Abdominal: Soft. There is no tenderness.  Musculoskeletal:  Tenderness and spasms noted in the left trapezius and left periscapular muscles. Pt holding his shoulders shrugged. Pain with any ROM of the neck. No midline cervical spine tenderness. Full ROM of left shoulder.   Neurological: He is alert and oriented to person, place, and time.  Skin: Skin is warm and dry.  Nursing note and vitals reviewed.   ED Course  Procedures (including critical care time) Labs Review Labs Reviewed  CBC - Abnormal; Notable for the following:    WBC 11.4 (*)    All other components within normal limits  COMPREHENSIVE METABOLIC PANEL - Abnormal; Notable for the following:    Sodium 132 (*)    Glucose, Bld 128 (*)    GFR calc non Af Amer 57 (*)    GFR calc Af Amer 66 (*)    All other components within normal limits  Randolm Idol, ED    Imaging Review Mr Cervical Spine Wo Contrast  11/30/2014   CLINICAL DATA:  LEFT shoulder pain. Onset of symptoms yesterday. History of arthritis in the neck. No recent injury. Seven out of 10 pain. Initial encounter.  EXAM: MRI CERVICAL SPINE WITHOUT CONTRAST  TECHNIQUE: Multiplanar, multisequence MR imaging of the cervical spine was performed. No intravenous contrast was administered.  COMPARISON:  08/18/2014 radiographs.  Previous MRI 07/13/2007.  FINDINGS: Alignment: Mildly exaggerated cervical lordosis is present. This appears fixed by a C3 through C5 ACDF.  Vertebrae: There is new edema in the superior aspect of the odontoid. There is also bone marrow edema in the  lateral masses of C1 and extending through the anterior C1 arch. Small C1-C2 effusions are present. Mild thickening of the cruciform ligament of the atlas. All of these changes are new compared to the prior study of 07/13/2007.  Artifact from cervical spine fixation hardware extends from C3 through C5. Degenerative endplate changes are present at C5-C6. No cervical spine fracture is visible.  Additionally, bilateral facet effusions and right-greater-than-left facet arthritis are present at C2-C3 (image number 1 series 5, image 13 series 5).  Cord: No intramedullary lesion or cord edema.  Posterior Fossa: Empty sella incidentally noted.  Vertebral Arteries: Flow voids present bilaterally. LEFT dominant vertebral system.  Paraspinal tissues: No paraspinal fluid collections are identified. The prevertebral soft tissues are nearly within normal limits. There is some trace edema in the prevertebral soft tissues  anterior to the odontoid and C1-C2 junction.  Disc levels:  C2-C3: There is no liquefaction of the disc her fluid signal in the disc space. Central canal is patent. There is RIGHT foraminal stenosis associated with facet arthrosis. Central canal and LEFT foramen patent.  C3-C4:  ACDF.  No recurrent stenosis.  C4-C5: ACDF.  No recurrent stenosis.  C5-C6: Mild central stenosis associated with shallow disc osteophyte complex. Bilateral foraminal stenosis is present with bilateral uncovertebral spurring that produces bilateral foraminal stenosis. This potentially affects both C6 nerves.  C6-C7: Severe LEFT facet arthrosis and moderate RIGHT facet arthrosis. There is bilateral foraminal stenosis potentially affecting both C7 nerves. This is due to a combination of uncovertebral spurring and facet arthrosis. The central canal is patent.  C7-T1: LEFT facet arthritis is present. Trace anterolisthesis is probably due to the facet arthritis. Central canal appears adequately patent. There is mild LEFT foraminal stenosis  secondary to facet arthrosis.  IMPRESSION: 1. Inflammatory changes in the odontoid and around the atlantodental joint and both C1-C2 facet joints. Differential considerations include inflammatory arthropathy such as rheumatoid or septic arthritis in this patient with elevated white blood cell count. No abscess or discitis. 2. Inflammatory changes also involve both C2-C3 facet joints, greater on the RIGHT than LEFT. Because of the adjacent fusion, the differential considerations also include degenerative arthritis however septic arthritis and inflammatory arthritis considerations still apply. 3. Adjacent segment disease at C5-C6 with mild central stenosis. 4. Bilateral foraminal stenosis at C5-C6 and C6-C7 potentially affecting the C6 and C7 nerves bilaterally. 5. LEFT C6-C7 facet arthritis, likely degenerative.   Electronically Signed   By: Dereck Ligas M.D.   On: 11/30/2014 12:18     EKG Interpretation   Date/Time:  Wednesday November 30 2014 05:55:25 EST Ventricular Rate:  86 PR Interval:  184 QRS Duration: 149 QT Interval:  393 QTC Calculation: 470 R Axis:   18 Text Interpretation:  Sinus rhythm Left bundle branch block No previous  tracing Confirmed by Maryan Rued  MD, WHITNEY ([id number]) on 11/30/2014 7:03:54 AM      MDM   Final diagnoses:  Pain  Cervical radiculopathy  Muscle spasm      patient is here with neck pain, left shoulder pain. My exam, I suspect patient's symptoms are most likely coming from muscle spasms in his trapezius and left parascapular area. Ordered Valium and oxycodone 10 mg. Will recheck.    Patient feels much better after medications, he continues to have some pain however. Patient is asking if we can try to get his MRI moved up. I spoke with [facility] , no appointment sooner than February 7. Also spoke with MRI at [facility] , they can do his MRI this morning, no other appointments available until patient's scheduled appointment.  We will get MRI done  here.  10:52 AM  Patient is taken to MRI.  1:07 PM MR results back. Paged Dr. Ronnald Ramp to discuss results.   Discussed with dr. Ronnald Ramp who looked over MR, asked for sed rate and crp. Will return tomorrow. Pt will need to followup with Dr. Ronnald Ramp closely. i doubt pt has infectious process given no fever in ED, pain has been there for several weeks. Instructed to return if worsening symptoms.   Medications  diazepam (VALIUM) injection 5 mg (5 mg Intravenous Given 11/30/14 0651)  oxyCODONE-acetaminophen (PERCOCET/ROXICET) 5-325 MG per tablet 2 tablet (2 tablets Oral Given 11/30/14 0651)  HYDROmorphone (DILAUDID) injection 1 mg (1 mg Intravenous Given 11/30/14 1047)  LORazepam (  ATIVAN) injection 1 mg (1 mg Intravenous Given 11/30/14 1045)     Renold Genta, PA-C 11/30/14 1613  Blanchie Dessert, MD 12/02/14 0015

## 2014-11-30 NOTE — ED Notes (Signed)
Pt states he took dilaudid at 6pm yesterday evening. Pt states he has been taking his oxycodone 2 pills every 3 hours with no relief.

## 2014-11-30 NOTE — ED Notes (Signed)
Pt continues to wait for MRI; will medicate at that time.

## 2014-11-30 NOTE — ED Notes (Addendum)
Patient transported to MRI without distress on 2 L O2.

## 2014-11-30 NOTE — ED Notes (Signed)
Pt in MRI.

## 2014-11-30 NOTE — Discharge Instructions (Signed)
Continue your regular medications. Valium for spasms as prescribed. Follow up with Dr. Ronnald Ramp as soon as able. Return if worsening symptoms.   Muscle Cramps and Spasms Muscle cramps and spasms occur when a muscle or muscles tighten and you have no control over this tightening (involuntary muscle contraction). They are a common problem and can develop in any muscle. The most common place is in the calf muscles of the leg. Both muscle cramps and muscle spasms are involuntary muscle contractions, but they also have differences:   Muscle cramps are sporadic and painful. They may last a few seconds to a quarter of an hour. Muscle cramps are often more forceful and last longer than muscle spasms.  Muscle spasms may or may not be painful. They may also last just a few seconds or much longer. CAUSES  It is uncommon for cramps or spasms to be due to a serious underlying problem. In many cases, the cause of cramps or spasms is unknown. Some common causes are:   Overexertion.   Overuse from repetitive motions (doing the same thing over and over).   Remaining in a certain position for a long period of time.   Improper preparation, form, or technique while performing a sport or activity.   Dehydration.   Injury.   Side effects of some medicines.   Abnormally low levels of the salts and ions in your blood (electrolytes), especially potassium and calcium. This could happen if you are taking water pills (diuretics) or you are pregnant.  Some underlying medical problems can make it more likely to develop cramps or spasms. These include, but are not limited to:   Diabetes.   Parkinson disease.   Hormone disorders, such as thyroid problems.   Alcohol abuse.   Diseases specific to muscles, joints, and bones.   Blood vessel disease where not enough blood is getting to the muscles.  HOME CARE INSTRUCTIONS   Stay well hydrated. Drink enough water and fluids to keep your urine clear or  pale yellow.  It may be helpful to massage, stretch, and relax the affected muscle.  For tight or tense muscles, use a warm towel, heating pad, or hot shower water directed to the affected area.  If you are sore or have pain after a cramp or spasm, applying ice to the affected area may relieve discomfort.  Put ice in a plastic bag.  Place a towel between your skin and the bag.  Leave the ice on for 15-20 minutes, 03-04 times a day.  Medicines used to treat a known cause of cramps or spasms may help reduce their frequency or severity. Only take over-the-counter or prescription medicines as directed by your caregiver. SEEK MEDICAL CARE IF:  Your cramps or spasms get more severe, more frequent, or do not improve over time.  MAKE SURE YOU:   Understand these instructions.  Will watch your condition.  Will get help right away if you are not doing well or get worse. Document Released: 04/12/2002 Document Revised: 02/15/2013 Document Reviewed: 10/07/2012 [facility] Patient Information 2015 [place], [place]. This information is not intended to replace advice given to you by your health care provider. Make sure you discuss any questions you have with your health care provider.

## 2014-11-30 NOTE — ED Notes (Signed)
Patient is resting comfortably. 

## 2014-12-01 ENCOUNTER — Encounter (HOSPITAL_COMMUNITY): Payer: Self-pay | Admitting: Adult Health

## 2014-12-01 ENCOUNTER — Inpatient Hospital Stay (HOSPITAL_COMMUNITY)
Admission: EM | Admit: 2014-12-01 | Discharge: 2014-12-03 | DRG: 554 | Disposition: A | Discharge status: Home / Self Care | Payer: Medicare Other | Attending: Internal Medicine | Admitting: Internal Medicine

## 2014-12-01 ENCOUNTER — Emergency Department (HOSPITAL_COMMUNITY): Payer: Medicare Other

## 2014-12-01 DIAGNOSIS — R509 Fever, unspecified: Secondary | ICD-10-CM | POA: Diagnosis not present

## 2014-12-01 DIAGNOSIS — R29898 Other symptoms and signs involving the musculoskeletal system: Secondary | ICD-10-CM

## 2014-12-01 DIAGNOSIS — E871 Hypo-osmolality and hyponatremia: Secondary | ICD-10-CM | POA: Diagnosis not present

## 2014-12-01 DIAGNOSIS — M109 Gout, unspecified: Secondary | ICD-10-CM | POA: Diagnosis present

## 2014-12-01 DIAGNOSIS — Z8546 Personal history of malignant neoplasm of prostate: Secondary | ICD-10-CM

## 2014-12-01 DIAGNOSIS — M11221 Other chondrocalcinosis, right elbow: Principal | ICD-10-CM | POA: Diagnosis present

## 2014-12-01 DIAGNOSIS — A419 Sepsis, unspecified organism: Secondary | ICD-10-CM | POA: Diagnosis not present

## 2014-12-01 DIAGNOSIS — Z981 Arthrodesis status: Secondary | ICD-10-CM

## 2014-12-01 DIAGNOSIS — E785 Hyperlipidemia, unspecified: Secondary | ICD-10-CM | POA: Diagnosis present

## 2014-12-01 DIAGNOSIS — I10 Essential (primary) hypertension: Secondary | ICD-10-CM | POA: Diagnosis not present

## 2014-12-01 DIAGNOSIS — M25529 Pain in unspecified elbow: Secondary | ICD-10-CM

## 2014-12-01 DIAGNOSIS — M25521 Pain in right elbow: Secondary | ICD-10-CM | POA: Diagnosis not present

## 2014-12-01 DIAGNOSIS — M11821 Other specified crystal arthropathies, right elbow: Secondary | ICD-10-CM | POA: Diagnosis not present

## 2014-12-01 DIAGNOSIS — T40605A Adverse effect of unspecified narcotics, initial encounter: Secondary | ICD-10-CM | POA: Diagnosis present

## 2014-12-01 DIAGNOSIS — M79601 Pain in right arm: Secondary | ICD-10-CM | POA: Diagnosis not present

## 2014-12-01 DIAGNOSIS — K5900 Constipation, unspecified: Secondary | ICD-10-CM | POA: Diagnosis present

## 2014-12-01 DIAGNOSIS — G8929 Other chronic pain: Secondary | ICD-10-CM | POA: Diagnosis present

## 2014-12-01 DIAGNOSIS — F329 Major depressive disorder, single episode, unspecified: Secondary | ICD-10-CM | POA: Diagnosis present

## 2014-12-01 DIAGNOSIS — D72829 Elevated white blood cell count, unspecified: Secondary | ICD-10-CM | POA: Diagnosis present

## 2014-12-01 DIAGNOSIS — M25429 Effusion, unspecified elbow: Secondary | ICD-10-CM

## 2014-12-01 DIAGNOSIS — M11229 Other chondrocalcinosis, unspecified elbow: Secondary | ICD-10-CM | POA: Diagnosis present

## 2014-12-01 DIAGNOSIS — M7989 Other specified soft tissue disorders: Secondary | ICD-10-CM | POA: Diagnosis not present

## 2014-12-01 DIAGNOSIS — R0902 Hypoxemia: Secondary | ICD-10-CM | POA: Diagnosis present

## 2014-12-01 DIAGNOSIS — M009 Pyogenic arthritis, unspecified: Secondary | ICD-10-CM | POA: Diagnosis present

## 2014-12-01 DIAGNOSIS — M1990 Unspecified osteoarthritis, unspecified site: Secondary | ICD-10-CM | POA: Diagnosis present

## 2014-12-01 DIAGNOSIS — D509 Iron deficiency anemia, unspecified: Secondary | ICD-10-CM | POA: Diagnosis present

## 2014-12-01 LAB — I-STAT TROPONIN, ED: Troponin i, poc: 0 ng/mL (ref 0.00–0.08)

## 2014-12-01 LAB — CBC WITH DIFFERENTIAL/PLATELET
BASOS PCT: 0 % (ref 0–1)
Basophils Absolute: 0 10*3/uL (ref 0.0–0.1)
EOS ABS: 0 10*3/uL (ref 0.0–0.7)
Eosinophils Relative: 0 % (ref 0–5)
HCT: 41.3 % (ref 39.0–52.0)
Hemoglobin: 14.7 g/dL (ref 13.0–17.0)
Lymphocytes Relative: 9 % — ABNORMAL LOW (ref 12–46)
Lymphs Abs: 1.1 10*3/uL (ref 0.7–4.0)
MCH: 31.7 pg (ref 26.0–34.0)
MCHC: 35.6 g/dL (ref 30.0–36.0)
MCV: 89.2 fL (ref 78.0–100.0)
Monocytes Absolute: 1.4 10*3/uL — ABNORMAL HIGH (ref 0.1–1.0)
Monocytes Relative: 13 % — ABNORMAL HIGH (ref 3–12)
NEUTROS ABS: 8.8 10*3/uL — AB (ref 1.7–7.7)
Neutrophils Relative %: 78 % — ABNORMAL HIGH (ref 43–77)
Platelets: 223 10*3/uL (ref 150–400)
RBC: 4.63 MIL/uL (ref 4.22–5.81)
RDW: 13.1 % (ref 11.5–15.5)
WBC: 11.3 10*3/uL — ABNORMAL HIGH (ref 4.0–10.5)

## 2014-12-01 LAB — BASIC METABOLIC PANEL
Anion gap: 9 (ref 5–15)
BUN: 15 mg/dL (ref 6–23)
CO2: 25 mmol/L (ref 19–32)
Calcium: 9.8 mg/dL (ref 8.4–10.5)
Chloride: 96 mmol/L (ref 96–112)
Creatinine, Ser: 1.06 mg/dL (ref 0.50–1.35)
GFR, EST AFRICAN AMERICAN: 80 mL/min — AB (ref >90)
GFR, EST NON AFRICAN AMERICAN: 69 mL/min — AB (ref >90)
GLUCOSE: 154 mg/dL — AB (ref 70–99)
Potassium: 3.9 mmol/L (ref 3.5–5.1)
Sodium: 130 mmol/L — ABNORMAL LOW (ref 135–145)

## 2014-12-01 LAB — I-STAT CG4 LACTIC ACID, ED: LACTIC ACID, VENOUS: 1.24 mmol/L (ref 0.5–2.0)

## 2014-12-01 MED ORDER — SODIUM CHLORIDE 0.9 % IV BOLUS (SEPSIS)
1000.0000 mL | Freq: Once | INTRAVENOUS | Status: AC
  Administered 2014-12-01: 1000 mL via INTRAVENOUS

## 2014-12-01 MED ORDER — LIDOCAINE-EPINEPHRINE 1 %-1:100000 IJ SOLN
10.0000 mL | Freq: Once | INTRAMUSCULAR | Status: AC
Start: 2014-12-01 — End: 2014-12-01
  Administered 2014-12-01: 10 mL
  Filled 2014-12-01: qty 1

## 2014-12-01 NOTE — ED Notes (Addendum)
Pt seen at [facility] yesterday and had MRI due to back pain and right arm pain, inability to use right arm. He is febrile with a temp of 101. Given 1 gram of tylenol-temp here 99.7 orally. Pt is slightly confused and diaphoretic. Unable to move right arm. C/o severe right arm pain.

## 2014-12-02 ENCOUNTER — Inpatient Hospital Stay (HOSPITAL_COMMUNITY): Payer: Medicare Other

## 2014-12-02 DIAGNOSIS — M1990 Unspecified osteoarthritis, unspecified site: Secondary | ICD-10-CM | POA: Diagnosis present

## 2014-12-02 DIAGNOSIS — M25521 Pain in right elbow: Secondary | ICD-10-CM | POA: Diagnosis not present

## 2014-12-02 DIAGNOSIS — M11821 Other specified crystal arthropathies, right elbow: Secondary | ICD-10-CM

## 2014-12-02 DIAGNOSIS — R0902 Hypoxemia: Secondary | ICD-10-CM | POA: Diagnosis present

## 2014-12-02 DIAGNOSIS — D509 Iron deficiency anemia, unspecified: Secondary | ICD-10-CM | POA: Diagnosis present

## 2014-12-02 DIAGNOSIS — M11229 Other chondrocalcinosis, unspecified elbow: Secondary | ICD-10-CM | POA: Diagnosis present

## 2014-12-02 DIAGNOSIS — I10 Essential (primary) hypertension: Secondary | ICD-10-CM | POA: Diagnosis present

## 2014-12-02 DIAGNOSIS — G8929 Other chronic pain: Secondary | ICD-10-CM | POA: Diagnosis present

## 2014-12-02 DIAGNOSIS — E785 Hyperlipidemia, unspecified: Secondary | ICD-10-CM | POA: Diagnosis present

## 2014-12-02 DIAGNOSIS — K5900 Constipation, unspecified: Secondary | ICD-10-CM | POA: Diagnosis present

## 2014-12-02 DIAGNOSIS — Z8546 Personal history of malignant neoplasm of prostate: Secondary | ICD-10-CM | POA: Diagnosis not present

## 2014-12-02 DIAGNOSIS — T40605A Adverse effect of unspecified narcotics, initial encounter: Secondary | ICD-10-CM | POA: Diagnosis present

## 2014-12-02 DIAGNOSIS — M009 Pyogenic arthritis, unspecified: Secondary | ICD-10-CM | POA: Diagnosis present

## 2014-12-02 DIAGNOSIS — M19021 Primary osteoarthritis, right elbow: Secondary | ICD-10-CM | POA: Diagnosis not present

## 2014-12-02 DIAGNOSIS — D72829 Elevated white blood cell count, unspecified: Secondary | ICD-10-CM | POA: Diagnosis present

## 2014-12-02 DIAGNOSIS — E871 Hypo-osmolality and hyponatremia: Secondary | ICD-10-CM

## 2014-12-02 DIAGNOSIS — M109 Gout, unspecified: Secondary | ICD-10-CM

## 2014-12-02 DIAGNOSIS — M11221 Other chondrocalcinosis, right elbow: Secondary | ICD-10-CM | POA: Diagnosis present

## 2014-12-02 DIAGNOSIS — Z981 Arthrodesis status: Secondary | ICD-10-CM | POA: Diagnosis not present

## 2014-12-02 DIAGNOSIS — M7989 Other specified soft tissue disorders: Secondary | ICD-10-CM | POA: Diagnosis not present

## 2014-12-02 DIAGNOSIS — F329 Major depressive disorder, single episode, unspecified: Secondary | ICD-10-CM | POA: Diagnosis present

## 2014-12-02 DIAGNOSIS — R29898 Other symptoms and signs involving the musculoskeletal system: Secondary | ICD-10-CM

## 2014-12-02 LAB — CBC
HCT: 36.2 % — ABNORMAL LOW (ref 39.0–52.0)
Hemoglobin: 12.3 g/dL — ABNORMAL LOW (ref 13.0–17.0)
MCH: 30.9 pg (ref 26.0–34.0)
MCHC: 34 g/dL (ref 30.0–36.0)
MCV: 91 fL (ref 78.0–100.0)
Platelets: 221 10*3/uL (ref 150–400)
RBC: 3.98 MIL/uL — AB (ref 4.22–5.81)
RDW: 13.1 % (ref 11.5–15.5)
WBC: 9.6 10*3/uL (ref 4.0–10.5)

## 2014-12-02 LAB — IRON AND TIBC
IRON: 13 ug/dL — AB (ref 42–165)
Saturation Ratios: 6 % — ABNORMAL LOW (ref 20–55)
TIBC: 203 ug/dL — ABNORMAL LOW (ref 215–435)
UIBC: 190 ug/dL (ref 125–400)

## 2014-12-02 LAB — URINALYSIS, ROUTINE W REFLEX MICROSCOPIC
Bilirubin Urine: NEGATIVE
Glucose, UA: NEGATIVE mg/dL
HGB URINE DIPSTICK: NEGATIVE
KETONES UR: NEGATIVE mg/dL
Leukocytes, UA: NEGATIVE
NITRITE: NEGATIVE
Protein, ur: NEGATIVE mg/dL
SPECIFIC GRAVITY, URINE: 1.018 (ref 1.005–1.030)
UROBILINOGEN UA: 1 mg/dL (ref 0.0–1.0)
pH: 6 (ref 5.0–8.0)

## 2014-12-02 LAB — GLUCOSE, SYNOVIAL FLUID

## 2014-12-02 LAB — COMPREHENSIVE METABOLIC PANEL
ALK PHOS: 85 U/L (ref 39–117)
ALT: 24 U/L (ref 0–53)
ANION GAP: 3 — AB (ref 5–15)
AST: 24 U/L (ref 0–37)
Albumin: 3 g/dL — ABNORMAL LOW (ref 3.5–5.2)
BILIRUBIN TOTAL: 0.9 mg/dL (ref 0.3–1.2)
BUN: 13 mg/dL (ref 6–23)
CO2: 32 mmol/L (ref 19–32)
CREATININE: 0.93 mg/dL (ref 0.50–1.35)
Calcium: 9 mg/dL (ref 8.4–10.5)
Chloride: 99 mmol/L (ref 96–112)
GFR calc Af Amer: >90 mL/min (ref >90)
GFR calc non Af Amer: 83 mL/min — ABNORMAL LOW (ref >90)
Glucose, Bld: 116 mg/dL — ABNORMAL HIGH (ref 70–99)
Potassium: 3.7 mmol/L (ref 3.5–5.1)
SODIUM: 134 mmol/L — AB (ref 135–145)
TOTAL PROTEIN: 6.5 g/dL (ref 6.0–8.3)

## 2014-12-02 LAB — GRAM STAIN: SPECIAL REQUESTS: NORMAL

## 2014-12-02 LAB — SYNOVIAL CELL COUNT + DIFF, W/ CRYSTALS
Lymphocytes-Synovial Fld: 12 % (ref 0–20)
Monocyte-Macrophage-Synovial Fluid: 10 % — ABNORMAL LOW (ref 50–90)
NEUTROPHIL, SYNOVIAL: 77 % — AB (ref 0–25)
WBC, SYNOVIAL: 71984 /mm3 — AB (ref 0–200)

## 2014-12-02 LAB — PROTIME-INR
INR: 1.1 (ref 0.00–1.49)
Prothrombin Time: 14.3 seconds (ref 11.6–15.2)

## 2014-12-02 LAB — CBG MONITORING, ED: Glucose-Capillary: 127 mg/dL — ABNORMAL HIGH (ref 70–99)

## 2014-12-02 LAB — FERRITIN: Ferritin: 1022 ng/mL — ABNORMAL HIGH (ref 22–322)

## 2014-12-02 LAB — PHOSPHORUS: PHOSPHORUS: 2.6 mg/dL (ref 2.3–4.6)

## 2014-12-02 LAB — URIC ACID: Uric Acid, Serum: 4.5 mg/dL (ref 4.0–7.8)

## 2014-12-02 LAB — MAGNESIUM: Magnesium: 1.7 mg/dL (ref 1.5–2.5)

## 2014-12-02 MED ORDER — ACETAMINOPHEN 650 MG RE SUPP: 650.0000 mg | Freq: Four times a day (QID) | RECTAL | Status: DC | PRN

## 2014-12-02 MED ORDER — SODIUM CHLORIDE 0.9 % IJ SOLN
3.0000 mL | Freq: Two times a day (BID) | INTRAMUSCULAR | Status: DC
  Administered 2014-12-03: 3 mL via INTRAVENOUS

## 2014-12-02 MED ORDER — HYDROMORPHONE HCL 1 MG/ML IJ SOLN: 0.5000 mg | INTRAMUSCULAR | Status: DC | PRN

## 2014-12-02 MED ORDER — ACETAMINOPHEN 325 MG PO TABS: 650.0000 mg | ORAL_TABLET | Freq: Four times a day (QID) | ORAL | Status: DC | PRN

## 2014-12-02 MED ORDER — NABUMETONE 500 MG PO TABS: 500.0000 mg | ORAL_TABLET | Freq: Two times a day (BID) | ORAL | Status: DC

## 2014-12-02 MED ORDER — GABAPENTIN 100 MG PO CAPS
100.0000 mg | ORAL_CAPSULE | Freq: Two times a day (BID) | ORAL | Status: DC
  Administered 2014-12-02 – 2014-12-03 (×3): 100 mg via ORAL
  Filled 2014-12-02 (×4): qty 1

## 2014-12-02 MED ORDER — POLYETHYLENE GLYCOL 3350 17 G PO PACK
17.0000 g | PACK | Freq: Every day | ORAL | Status: DC
  Administered 2014-12-02 – 2014-12-03 (×2): 17 g via ORAL
  Filled 2014-12-02 (×2): qty 1

## 2014-12-02 MED ORDER — METHYLPREDNISOLONE ACETATE 80 MG/ML IJ SUSP
80.0000 mg | Freq: Once | INTRAMUSCULAR | Status: DC
  Filled 2014-12-02: qty 1

## 2014-12-02 MED ORDER — SODIUM CHLORIDE 0.9 % IV SOLN
INTRAVENOUS | Status: DC
  Administered 2014-12-02 (×2): via INTRAVENOUS

## 2014-12-02 MED ORDER — DIAZEPAM 5 MG PO TABS
10.0000 mg | ORAL_TABLET | Freq: Two times a day (BID) | ORAL | Status: DC | PRN
  Administered 2014-12-02: 10 mg via ORAL
  Filled 2014-12-02: qty 2

## 2014-12-02 MED ORDER — ALLOPURINOL 100 MG PO TABS
100.0000 mg | ORAL_TABLET | Freq: Every day | ORAL | Status: DC
  Administered 2014-12-02 – 2014-12-03 (×2): 100 mg via ORAL
  Filled 2014-12-02 (×2): qty 1

## 2014-12-02 MED ORDER — NAPROXEN 500 MG PO TABS
500.0000 mg | ORAL_TABLET | Freq: Two times a day (BID) | ORAL | Status: DC
  Administered 2014-12-02: 500 mg via ORAL
  Filled 2014-12-02 (×3): qty 1

## 2014-12-02 MED ORDER — ENOXAPARIN SODIUM 60 MG/0.6ML ~~LOC~~ SOLN
55.0000 mg | SUBCUTANEOUS | Status: DC
  Administered 2014-12-02: 55 mg via SUBCUTANEOUS
  Filled 2014-12-02 (×2): qty 0.6

## 2014-12-02 MED ORDER — VANCOMYCIN HCL IN DEXTROSE 750-5 MG/150ML-% IV SOLN
750.0000 mg | Freq: Two times a day (BID) | INTRAVENOUS | Status: DC
  Administered 2014-12-02 – 2014-12-03 (×2): 750 mg via INTRAVENOUS
  Filled 2014-12-02 (×3): qty 150

## 2014-12-02 MED ORDER — OXYCODONE HCL 10 MG PO TABS: 10.0000 mg | ORAL_TABLET | Freq: Four times a day (QID) | ORAL | Status: DC | PRN

## 2014-12-02 MED ORDER — ATORVASTATIN CALCIUM 40 MG PO TABS
40.0000 mg | ORAL_TABLET | Freq: Every day | ORAL | Status: DC
  Administered 2014-12-02 – 2014-12-03 (×2): 40 mg via ORAL
  Filled 2014-12-02 (×2): qty 1

## 2014-12-02 MED ORDER — ONDANSETRON HCL 4 MG/2ML IJ SOLN: 4.0000 mg | Freq: Four times a day (QID) | INTRAMUSCULAR | Status: DC | PRN

## 2014-12-02 MED ORDER — MAGNESIUM SULFATE 2 GM/50ML IV SOLN
2.0000 g | Freq: Once | INTRAVENOUS | Status: AC
  Administered 2014-12-02: 2 g via INTRAVENOUS
  Filled 2014-12-02: qty 50

## 2014-12-02 MED ORDER — PAROXETINE HCL 20 MG PO TABS
20.0000 mg | ORAL_TABLET | Freq: Every day | ORAL | Status: DC
  Administered 2014-12-02 – 2014-12-03 (×2): 20 mg via ORAL
  Filled 2014-12-02 (×2): qty 1

## 2014-12-02 MED ORDER — CYCLOBENZAPRINE HCL 10 MG PO TABS: 10.0000 mg | ORAL_TABLET | Freq: Three times a day (TID) | ORAL | Status: DC | PRN

## 2014-12-02 MED ORDER — OXYCODONE-ACETAMINOPHEN 5-325 MG PO TABS
1.0000 | ORAL_TABLET | ORAL | Status: DC | PRN
  Administered 2014-12-02: 2 via ORAL
  Filled 2014-12-02: qty 2

## 2014-12-02 MED ORDER — ONDANSETRON HCL 4 MG PO TABS
4.0000 mg | ORAL_TABLET | Freq: Four times a day (QID) | ORAL | Status: DC | PRN
Start: 2014-12-02 — End: 2014-12-03

## 2014-12-02 MED ORDER — PANTOPRAZOLE SODIUM 40 MG IV SOLR
40.0000 mg | INTRAVENOUS | Status: DC
  Filled 2014-12-02 (×2): qty 40

## 2014-12-02 MED ORDER — ENOXAPARIN SODIUM 40 MG/0.4ML ~~LOC~~ SOLN
40.0000 mg | SUBCUTANEOUS | Status: DC
Start: 2014-12-02 — End: 2014-12-02
  Filled 2014-12-02: qty 0.4

## 2014-12-02 MED ORDER — VANCOMYCIN HCL 10 G IV SOLR
2000.0000 mg | Freq: Once | INTRAVENOUS | Status: AC
  Administered 2014-12-02: 2000 mg via INTRAVENOUS
  Filled 2014-12-02: qty 2000

## 2014-12-02 MED ORDER — INDOMETHACIN 50 MG PO CAPS
50.0000 mg | ORAL_CAPSULE | Freq: Three times a day (TID) | ORAL | Status: DC
  Administered 2014-12-02 – 2014-12-03 (×4): 50 mg via ORAL
  Filled 2014-12-02 (×7): qty 1

## 2014-12-02 MED ORDER — SENNOSIDES-DOCUSATE SODIUM 8.6-50 MG PO TABS
1.0000 | ORAL_TABLET | Freq: Two times a day (BID) | ORAL | Status: DC
Start: 2014-12-02 — End: 2014-12-03
  Administered 2014-12-02 – 2014-12-03 (×3): 1 via ORAL
  Filled 2014-12-02 (×3): qty 1

## 2014-12-02 MED ORDER — PANTOPRAZOLE SODIUM 40 MG PO TBEC
40.0000 mg | DELAYED_RELEASE_TABLET | Freq: Every day | ORAL | Status: DC
  Administered 2014-12-02: 40 mg via ORAL
  Filled 2014-12-02: qty 1

## 2014-12-02 MED ORDER — LIDOCAINE HCL (PF) 1 % IJ SOLN
INTRAMUSCULAR | Status: AC
  Filled 2014-12-02: qty 5

## 2014-12-02 NOTE — Progress Notes (Signed)
UR Completed.  [phone]  

## 2014-12-02 NOTE — Progress Notes (Signed)
I have seen and assessed the patient and agree with Dr. Serita Grit assessment and plan. Patient is a 69 year old gentleman presenting with right elbow swelling pain and decreased range of motion. Joint was aspirated and likely pseudogout. Due to elevated WBC is concern for septic arthritis. Patient has been placed on IV vancomycin. Will change Naprosyn to Indocin. Orthopedic consultation pending.

## 2014-12-02 NOTE — ED Provider Notes (Signed)
CSN: [id number]     Arrival date & time 12/01/14  2204 History   First MD Initiated Contact with Patient 12/01/14 2206     Chief Complaint  Patient presents with  . Fever     (Consider location/radiation/quality/duration/timing/severity/associated sxs/prior Treatment) HPI  This is a 69 year old male who presents emergency Department with chief complaint of right elbow pain. The patient was seen in the emergency department last night for severe left shoulder blade pain. He is unable to rotate his neck well last night. He has a history of chronic neck and back pain. An MRI was performed that showed inflammatory process at C1 and C2 concerning for possible rheumatologic or septic arthritis. The patient states that his pain was not at the occipital region, but over by the left shoulder blade. And CRP were ordered. His sedimentation rate was elevated and CRP came back highly elevated. Patient and his wife gives the history. His pain has been poorly controlled at home on 8 mg of hydromorphone every 4 hours. Patient states that his pain in his back and neck was HOWEVER, he suddenly began having severe right elbow pain. It hurts to move his elbow at all. Also, hurts to touch the elbow. The patient was found to have elevated temperature, tachycardia and and hypoxic. He does not feel any respiratory distress, he has not had any somnolence or episodes of apnea due to his narcotic use. He has a history of gout but states is generally in his big toe and he usually gets it about once a year. He denies myalgias, but endorses fatigue, malaise, and uncontrolled pain.Denies fevers, chills, myalgias, arthralgias. Denies DOE, SOB, chest tightness or pressure, radiation to left arm, jaw or back, or diaphoresis. Denies dysuria, flank pain, suprapubic pain, frequency, urgency, or hematuria. Denies headaches, light headedness, weakness, visual disturbances. Denies abdominal pain, nausea, vomiting, diarrhea or  constipation.   Past Medical History  Diagnosis Date  . Gout   . Hyperlipidemia   . Hypertension   . Depression   . Prostate cancer     history of  . Chronic neck pain   . ED (erectile dysfunction)   . Arthritis     neck and back    Past Surgical History  Procedure Laterality Date  . Prostatectomy    . Tonsillectomy    . Vasectomy    . Colonoscopy  12/22/06    per Dr. Ardis Hughs, repeat in 10 yrs  . Cervical fusion  12/02/07    per Dr. Sherley Bounds   Family History  Problem Relation Age of Onset  . Cancer Other     breast, porstate  . Hypertension Other   . Stroke Other    History  Substance Use Topics  . Smoking status: Never Smoker   . Smokeless tobacco: Never Used  . Alcohol Use: 0.0 oz/week    0 Not specified per week     Comment: occ    Review of Systems  Ten systems reviewed and are negative for acute change, except as noted in the HPI.   Allergies  Review of patient's allergies indicates no known allergies.  Home Medications   Prior to Admission medications   Medication Sig Start Date End Date Taking? Authorizing Provider  allopurinol (ZYLOPRIM) 100 MG tablet Take 1 tablet (100 mg total) by mouth daily. 12/16/13  Yes [name], MD  atorvastatin (LIPITOR) 40 MG tablet Take 1 tablet (40 mg total) by mouth daily. 12/16/13  Yes [name], MD  cyclobenzaprine (FLEXERIL) 10 MG tablet Take 10 mg by mouth 3 (three) times daily as needed for muscle spasms.   Yes Historical Provider, MD  diazepam (VALIUM) 5 MG tablet Take 2 tablets (10 mg total) by mouth every 12 (twelve) hours as needed for anxiety. 11/30/14  Yes Tatyana A Kirichenko, PA-C  diclofenac (VOLTAREN) 75 MG EC tablet Take 1 tablet (75 mg total) by mouth 2 (two) times daily. 12/16/13  Yes [name], MD  gabapentin (NEURONTIN) 100 MG capsule Take 100 mg by mouth 2 (two) times daily.   Yes Historical Provider, MD  HYDROmorphone (DILAUDID) 8 MG tablet Take 1 tablet (8 mg total) by mouth every 4 (four)  hours as needed for severe pain. 11/16/14  Yes [name], MD  lisinopril-hydrochlorothiazide (PRINZIDE,ZESTORETIC) 20-25 MG per tablet Take 1 tablet by mouth daily. 12/16/13 12/16/14 Yes [name], MD  nabumetone (RELAFEN) 500 MG tablet Take 500 mg by mouth 2 (two) times daily.   Yes Historical Provider, MD  Oxycodone HCl 10 MG TABS Take 10 mg by mouth every 6 (six) hours as needed (for pain).   Yes Historical Provider, MD  PARoxetine (PAXIL) 20 MG tablet Take 1 tablet (20 mg total) by mouth every morning. 12/16/13  Yes [name], MD   BP 152/79 mmHg  Pulse 87  Temp(Src) 100.6 F (38.1 C) (Rectal)  Resp 20  SpO2 96% Physical Exam  Constitutional: He appears well-developed and well-nourished. No distress.  HENT:  Head: Normocephalic and atraumatic.  Eyes: Conjunctivae are normal. No scleral icterus.  Neck: Normal range of motion. Neck supple.  Cardiovascular: Normal rate, regular rhythm and normal heart sounds.   Pulmonary/Chest: Effort normal and breath sounds normal. No respiratory distress.  Abdominal: Soft. There is no tenderness.  Musculoskeletal: He exhibits no edema.       Right elbow: He exhibits decreased range of motion, swelling and effusion. Tenderness found.       Back:  Right elbow is swollen, warm, tender to palpation. Patient is unable to flex or extend the arm and keeps the arm in approximately 80 of flexion at all time. There is warmth and swelling into the proximal forearm. No signs of cellulitis. Distal pulses intact   Neurological: He is alert.  Skin: Skin is warm. He is diaphoretic.  Psychiatric: His behavior is normal.  Nursing note and vitals reviewed.   ED Course  ARTHOCENTESIS Date/Time: 12/02/2014 12:49 AM Performed by: Margarita Mail Authorized by: Margarita Mail Consent: Written consent obtained. Risks and benefits: risks, benefits and alternatives were discussed Consent given by: patient Patient understanding: patient states  understanding of the procedure being performed Patient consent: the patient's understanding of the procedure matches consent given Procedure consent: procedure consent matches procedure scheduled Relevant documents: relevant documents present and verified Site marked: the operative site was marked Required items: required blood products, implants, devices, and special equipment available Patient identity confirmed: verbally with patient Time out: Immediately prior to procedure a "time out" was called to verify the correct patient, procedure, equipment, support staff and site/side marked as required. Indications: joint swelling,  pain,  possible septic joint and diagnostic evaluation  Body area: elbow Joint: right elbow Local anesthesia used: yes Local anesthetic: lidocaine 1% with epinephrine Anesthetic total: 2 ml Patient sedated: no Preparation: Patient was prepped and draped in the usual sterile fashion. Needle gauge: 18 G Ultrasound guidance: no Approach: lateral Aspirate: blood-tinged,  yellow and cloudy Aspirate amount: 12 mL Patient tolerance: Patient tolerated the  procedure well with no immediate complications   (including critical care time) Labs Review Labs Reviewed  BASIC METABOLIC PANEL - Abnormal; Notable for the following:    Sodium 130 (*)    Glucose, Bld 154 (*)    GFR calc non Af Amer 69 (*)    GFR calc Af Amer 80 (*)    All other components within normal limits  CBC WITH DIFFERENTIAL/PLATELET - Abnormal; Notable for the following:    WBC 11.3 (*)    Neutrophils Relative % 78 (*)    Neutro Abs 8.8 (*)    Lymphocytes Relative 9 (*)    Monocytes Relative 13 (*)    Monocytes Absolute 1.4 (*)    All other components within normal limits  CBG MONITORING, ED - Abnormal; Notable for the following:    Glucose-Capillary 127 (*)    All other components within normal limits  CULTURE, BLOOD (ROUTINE X 2)  CULTURE, BLOOD (ROUTINE X 2)  BODY FLUID CULTURE  GRAM STAIN   SYNOVIAL CELL COUNT + DIFF, W/ CRYSTALS  GLUCOSE, SYNOVIAL FLUID  I-STAT CG4 LACTIC ACID, ED  Randolm Idol, ED    Imaging Review Mr Cervical Spine Wo Contrast  11/30/2014   CLINICAL DATA:  LEFT shoulder pain. Onset of symptoms yesterday. History of arthritis in the neck. No recent injury. Seven out of 10 pain. Initial encounter.  EXAM: MRI CERVICAL SPINE WITHOUT CONTRAST  TECHNIQUE: Multiplanar, multisequence MR imaging of the cervical spine was performed. No intravenous contrast was administered.  COMPARISON:  08/18/2014 radiographs.  Previous MRI 07/13/2007.  FINDINGS: Alignment: Mildly exaggerated cervical lordosis is present. This appears fixed by a C3 through C5 ACDF.  Vertebrae: There is new edema in the superior aspect of the odontoid. There is also bone marrow edema in the lateral masses of C1 and extending through the anterior C1 arch. Small C1-C2 effusions are present. Mild thickening of the cruciform ligament of the atlas. All of these changes are new compared to the prior study of 07/13/2007.  Artifact from cervical spine fixation hardware extends from C3 through C5. Degenerative endplate changes are present at C5-C6. No cervical spine fracture is visible.  Additionally, bilateral facet effusions and right-greater-than-left facet arthritis are present at C2-C3 (image number 1 series 5, image 13 series 5).  Cord: No intramedullary lesion or cord edema.  Posterior Fossa: Empty sella incidentally noted.  Vertebral Arteries: Flow voids present bilaterally. LEFT dominant vertebral system.  Paraspinal tissues: No paraspinal fluid collections are identified. The prevertebral soft tissues are nearly within normal limits. There is some trace edema in the prevertebral soft tissues anterior to the odontoid and C1-C2 junction.  Disc levels:  C2-C3: There is no liquefaction of the disc her fluid signal in the disc space. Central canal is patent. There is RIGHT foraminal stenosis associated with facet  arthrosis. Central canal and LEFT foramen patent.  C3-C4:  ACDF.  No recurrent stenosis.  C4-C5: ACDF.  No recurrent stenosis.  C5-C6: Mild central stenosis associated with shallow disc osteophyte complex. Bilateral foraminal stenosis is present with bilateral uncovertebral spurring that produces bilateral foraminal stenosis. This potentially affects both C6 nerves.  C6-C7: Severe LEFT facet arthrosis and moderate RIGHT facet arthrosis. There is bilateral foraminal stenosis potentially affecting both C7 nerves. This is due to a combination of uncovertebral spurring and facet arthrosis. The central canal is patent.  C7-T1: LEFT facet arthritis is present. Trace anterolisthesis is probably due to the facet arthritis. Central canal appears adequately patent. There is mild  LEFT foraminal stenosis secondary to facet arthrosis.  IMPRESSION: 1. Inflammatory changes in the odontoid and around the atlantodental joint and both C1-C2 facet joints. Differential considerations include inflammatory arthropathy such as rheumatoid or septic arthritis in this patient with elevated white blood cell count. No abscess or discitis. 2. Inflammatory changes also involve both C2-C3 facet joints, greater on the RIGHT than LEFT. Because of the adjacent fusion, the differential considerations also include degenerative arthritis however septic arthritis and inflammatory arthritis considerations still apply. 3. Adjacent segment disease at C5-C6 with mild central stenosis. 4. Bilateral foraminal stenosis at C5-C6 and C6-C7 potentially affecting the C6 and C7 nerves bilaterally. 5. LEFT C6-C7 facet arthritis, likely degenerative.   Electronically Signed   By: Dereck Ligas M.D.   On: 11/30/2014 12:18   Dg Chest Portable 1 View  12/01/2014   CLINICAL DATA:  Code sepsis.  Initial encounter.  EXAM: PORTABLE CHEST - 1 VIEW  COMPARISON:  Chest radiograph performed 08/18/2014  FINDINGS: The lungs are well-aerated and clear. There is no evidence of  focal opacification, pleural effusion or pneumothorax.  The cardiomediastinal silhouette is within normal limits. No acute osseous abnormalities are seen. Cervical spinal fusion hardware is noted.  IMPRESSION: No acute cardiopulmonary process seen.   Electronically Signed   By: Garald Balding M.D.   On: 12/01/2014 22:51     EKG Interpretation   Date/Time:  Thursday December 01 2014 22:33:38 EST Ventricular Rate:  102 PR Interval:  187 QRS Duration: 150 QT Interval:  385 QTC Calculation: 501 R Axis:   9 Text Interpretation:  Sinus tachycardia Multiple ventricular premature  complexes Left bundle branch block pvc new from prior ekg Confirmed by  OTTER  MD, OLGA ([id number]) on 12/02/2014 12:55:38 AM      MDM   Final diagnoses:  Nontraumatic pain and swelling of elbow   12:52 AM Filed Vitals:   12/01/14 2254 12/01/14 2300 12/02/14 0000 12/02/14 0030  BP:  152/79 129/65 110/86  Pulse:  87 81 81  Temp: 100.6 F (38.1 C)     TempSrc: Rectal     Resp:  20 18 17   SpO2:  96% 45% 59%    69 year old male, febrile, tachycardic, multiple episodes of hypoxia into the high 80s. However, the patient is not having any respiratory distress, chest pain, tachypnea. Lung sounds are clear. He is hyponatremic, however, he appears clinically dehydrated with very dry oral mucosa. Arthrocentesis of the right elbow has been performed and fluid analysis is pending. His lactate is normal and negative troponin. Chest x-ray is without abnormality. Ekg shows sinus tachycardia with pvc new from previous.  I not feel the patient's questionable inflammatory process in the atlantooccipital region is relevant to today's presentation. Patient does have a history of long-term chronic pain of the neck and known arthritis. [name] right elbow is concerning for septic arthritis and his elevated sedimentation rate and CRP may have been in response to today's event. I am unsure as to the cause of his hypoxia.  1:14  AM  BP 118/68 mmHg  Pulse 78  Temp(Src) 98.1 F (36.7 C) (Oral)  Resp 16  SpO2 97%]  Patient vitals stabilizing. His temperature has resolved after Tyelnol given by EMS. Patient will be admitted for septic fever. Fluid analysis pending.   Margarita Mail, PA-C 12/08/14 [place], MD 12/09/14 [phone]

## 2014-12-02 NOTE — Consult Note (Signed)
Melrose Nakayama, MD           Loni Dolly, PA-C  [facility] [address], [place], [place]  [id number]   ORTHOPAEDIC CONSULTATION  Jamani P Baswell            MRN:  [id number] DOB/SEX:  Dec 30, 1945/male    REQUESTING PHYSICIAN:    CHIEF COMPLAINT:  Painful right elbow  HISTORY: Ricky Zorn Ogburnis a 69 y.o. male with a painful swollen right elbow.  His elbow was aspirated in the ER last night.  This showed pseudogout but there is also a elevated white count.  He is currently on IV antibiotics but most likely elevated white count was due to the pseudogout.  He denies any prior problems with his elbow.  He denies any injury.   PAST MEDICAL HISTORY: Patient Active Problem List   Diagnosis Date Noted  . Fever 12/02/2014  . Pseudogout of elbow 12/02/2014  . Arthritis 12/02/2014  . Hyponatremia 12/02/2014  . Constipation 12/02/2014  . Suspected Septic arthritis 12/02/2014  . Nontraumatic pain and swelling of elbow   . ACUTE SINUSITIS, UNSPECIFIED 10/01/2010  . GROIN PAIN 10/01/2010  . ACUTE BRONCHITIS 08/30/2008  . DEPRESSION 03/04/2008  . SCARLET FEVER 06/30/2007  . HYPERLIPIDEMIA 06/30/2007  . Gout 06/30/2007  . Essential hypertension 06/30/2007  . NECK PAIN 06/30/2007  . PROSTATE CANCER, HX OF 06/30/2007   Past Medical History  Diagnosis Date  . Gout   . Hyperlipidemia   . Hypertension   . Depression   . Prostate cancer     history of  . Chronic neck pain   . ED (erectile dysfunction)   . Arthritis     neck and back    Past Surgical History  Procedure Laterality Date  . Prostatectomy    . Tonsillectomy    . Vasectomy    . Colonoscopy  12/22/06    per Dr. Ardis Hughs, repeat in 10 yrs  . Cervical fusion  12/02/07    per Dr. Sherley Bounds     MEDICATIONS:   Current facility-administered medications:  .  0.9 %  sodium chloride infusion, , Intravenous, Continuous, Berle Mull, MD, Last Rate: 100 mL/hr at 12/02/14 4193 .  acetaminophen  (TYLENOL) tablet 650 mg, 650 mg, Oral, Q6H PRN **OR** acetaminophen (TYLENOL) suppository 650 mg, 650 mg, Rectal, Q6H PRN, Berle Mull, MD .  allopurinol (ZYLOPRIM) tablet 100 mg, 100 mg, Oral, Daily, Berle Mull, MD, 100 mg at 12/02/14 1047 .  atorvastatin (LIPITOR) tablet 40 mg, 40 mg, Oral, Daily, Berle Mull, MD, 40 mg at 12/02/14 1059 .  cyclobenzaprine (FLEXERIL) tablet 10 mg, 10 mg, Oral, TID PRN, Berle Mull, MD .  diazepam (VALIUM) tablet 10 mg, 10 mg, Oral, Q12H PRN, Berle Mull, MD, 10 mg at 12/02/14 7902 .  enoxaparin (LOVENOX) injection 55 mg, 55 mg, Subcutaneous, Q24H, Charmian Muff [place], RPH, 55 mg at 12/02/14 1650 .  gabapentin (NEURONTIN) capsule 100 mg, 100 mg, Oral, BID, Berle Mull, MD, 100 mg at 12/02/14 1048 .  HYDROmorphone (DILAUDID) injection 0.5 mg, 0.5 mg, Intravenous, Q3H PRN, Berle Mull, MD .  indomethacin (INDOCIN) capsule 50 mg, 50 mg, Oral, TID WC, Eugenie Filler, MD, 50 mg at 12/02/14 1652 .  lidocaine (PF) (XYLOCAINE) 1 % injection, , , ,  .  methylPREDNISolone acetate (DEPO-MEDROL) injection 80 mg, 80 mg, Intra-articular, Once, Irine Seal V, MD .  ondansetron [facility]) tablet 4 mg, 4 mg, Oral, Q6H PRN **OR** ondansetron (ZOFRAN) injection  4 mg, 4 mg, Intravenous, Q6H PRN, Berle Mull, MD .  oxyCODONE-acetaminophen (PERCOCET/ROXICET) 5-325 MG per tablet 1-2 tablet, 1-2 tablet, Oral, Q4H PRN, Berle Mull, MD, 2 tablet at 12/02/14 [phone] .  pantoprazole (PROTONIX) EC tablet 40 mg, 40 mg, Oral, QHS, Irine Seal V, MD .  PARoxetine (PAXIL) tablet 20 mg, 20 mg, Oral, Daily, Berle Mull, MD, 20 mg at 12/02/14 1049 .  polyethylene glycol (MIRALAX / GLYCOLAX) packet 17 g, 17 g, Oral, Daily, Berle Mull, MD, 17 g at 12/02/14 1047 .  senna-docusate (Senokot-S) tablet 1 tablet, 1 tablet, Oral, BID, Berle Mull, MD, 1 tablet at 12/02/14 1000 .  sodium chloride 0.9 % injection 3 mL, 3 mL, Intravenous, Q12H, Berle Mull, MD, 3 mL at 12/02/14 1000 .   vancomycin (VANCOCIN) IVPB 750 mg/150 ml premix, 750 mg, Intravenous, Q12H, Rogue Bussing, RPH, 750 mg at 12/02/14 1650  ALLERGIES:  No Known Allergies  REVIEW OF SYSTEMS: REVIEWED IN DETAIL IN CHART  FAMILY HISTORY:   Family History  Problem Relation Age of Onset  . Cancer Other     breast, porstate  . Hypertension Other   . Stroke Other     SOCIAL HISTORY:   History  Substance Use Topics  . Smoking status: Never Smoker   . Smokeless tobacco: Never Used  . Alcohol Use: 0.0 oz/week    0 Not specified per week     Comment: occ     EXAMINATION: Vital signs in last 24 hours: Temp:  [98.1 F (36.7 C)-100.6 F (38.1 C)] 98.6 F (37 C) (01/29 1700) Pulse Rate:  [70-113] 75 (01/29 1700) Resp:  [16-26] 18 (01/29 1700) BP: (102-161)/(62-86) 122/73 mmHg (01/29 1700) SpO2:  [90 %-99 %] 99 % (01/29 1700) Weight:  [113.49 kg (250 lb 3.2 oz)-115.2 kg (253 lb 15.5 oz)] 113.49 kg (250 lb 3.2 oz) (01/29 0500)  General appearance: alert, cooperative, appears stated age and no distress Eyes: negative findings: lids and lashes normal, conjunctivae and sclerae normal and pupils equal, round, reactive to light and accomodation Abdomen: normal findings: soft, non-tender Neurologic: Mental status: Alert, oriented, thought content appropriate  Musculoskeletal Exam  :examination of the right elbow shows erythema and swelling of the elbow joint.  Decreased range of motion due to pain.  Tenderness to palpation throughout.  No active drainage.normal sensory motor function throughout the arm.  He is neurovascular intact distally.   DIAGNOSTIC STUDIES: Recent laboratory studies:  Recent Labs  11/30/14 0600 12/01/14 2223 12/02/14 0550  WBC 11.4* 11.3* 9.6  HGB 14.5 14.7 12.3*  HCT 42.0 41.3 36.2*  PLT 201 223 221    Recent Labs  11/30/14 0600 12/01/14 2223 12/02/14 0550  NA 132* 130* 134*  K 3.9 3.9 3.7  CL 96 96 99  CO2 31 25 32  BUN 16 15 13   CREATININE 1.24 1.06 0.93   GLUCOSE 128* 154* 116*  CALCIUM 9.2 9.8 9.0   Lab Results  Component Value Date   INR 1.10 12/02/2014   INR 0.9 11/27/2007     Recent Radiographic Studies :  Dg Elbow Complete Right  12/02/2014   CLINICAL DATA:  Acute right elbow head pain and swelling for 3 days  EXAM: RIGHT ELBOW - COMPLETE 3+ VIEW  COMPARISON:  None.  FINDINGS: Normal right elbow alignment with minor degenerative changes. no subluxation, dislocation, acute osseous finding or fracture. Mild soft tissue swelling suspected posteriorly. No large joint effusion appreciated.  IMPRESSION: Degenerative changes and posterior right elbow mild soft  tissue swelling. No acute osseous finding.   Electronically Signed   By: Daryll Brod M.D.   On: 12/02/2014 10:10   Mr Cervical Spine Wo Contrast  11/30/2014   CLINICAL DATA:  LEFT shoulder pain. Onset of symptoms yesterday. History of arthritis in the neck. No recent injury. Seven out of 10 pain. Initial encounter.  EXAM: MRI CERVICAL SPINE WITHOUT CONTRAST  TECHNIQUE: Multiplanar, multisequence MR imaging of the cervical spine was performed. No intravenous contrast was administered.  COMPARISON:  08/18/2014 radiographs.  Previous MRI 07/13/2007.  FINDINGS: Alignment: Mildly exaggerated cervical lordosis is present. This appears fixed by a C3 through C5 ACDF.  Vertebrae: There is new edema in the superior aspect of the odontoid. There is also bone marrow edema in the lateral masses of C1 and extending through the anterior C1 arch. Small C1-C2 effusions are present. Mild thickening of the cruciform ligament of the atlas. All of these changes are new compared to the prior study of 07/13/2007.  Artifact from cervical spine fixation hardware extends from C3 through C5. Degenerative endplate changes are present at C5-C6. No cervical spine fracture is visible.  Additionally, bilateral facet effusions and right-greater-than-left facet arthritis are present at C2-C3 (image number 1 series 5, image 13  series 5).  Cord: No intramedullary lesion or cord edema.  Posterior Fossa: Empty sella incidentally noted.  Vertebral Arteries: Flow voids present bilaterally. LEFT dominant vertebral system.  Paraspinal tissues: No paraspinal fluid collections are identified. The prevertebral soft tissues are nearly within normal limits. There is some trace edema in the prevertebral soft tissues anterior to the odontoid and C1-C2 junction.  Disc levels:  C2-C3: There is no liquefaction of the disc her fluid signal in the disc space. Central canal is patent. There is RIGHT foraminal stenosis associated with facet arthrosis. Central canal and LEFT foramen patent.  C3-C4:  ACDF.  No recurrent stenosis.  C4-C5: ACDF.  No recurrent stenosis.  C5-C6: Mild central stenosis associated with shallow disc osteophyte complex. Bilateral foraminal stenosis is present with bilateral uncovertebral spurring that produces bilateral foraminal stenosis. This potentially affects both C6 nerves.  C6-C7: Severe LEFT facet arthrosis and moderate RIGHT facet arthrosis. There is bilateral foraminal stenosis potentially affecting both C7 nerves. This is due to a combination of uncovertebral spurring and facet arthrosis. The central canal is patent.  C7-T1: LEFT facet arthritis is present. Trace anterolisthesis is probably due to the facet arthritis. Central canal appears adequately patent. There is mild LEFT foraminal stenosis secondary to facet arthrosis.  IMPRESSION: 1. Inflammatory changes in the odontoid and around the atlantodental joint and both C1-C2 facet joints. Differential considerations include inflammatory arthropathy such as rheumatoid or septic arthritis in this patient with elevated white blood cell count. No abscess or discitis. 2. Inflammatory changes also involve both C2-C3 facet joints, greater on the RIGHT than LEFT. Because of the adjacent fusion, the differential considerations also include degenerative arthritis however septic  arthritis and inflammatory arthritis considerations still apply. 3. Adjacent segment disease at C5-C6 with mild central stenosis. 4. Bilateral foraminal stenosis at C5-C6 and C6-C7 potentially affecting the C6 and C7 nerves bilaterally. 5. LEFT C6-C7 facet arthritis, likely degenerative.   Electronically Signed   By: Dereck Ligas M.D.   On: 11/30/2014 12:18   Dg Chest Portable 1 View  12/01/2014   CLINICAL DATA:  Code sepsis.  Initial encounter.  EXAM: PORTABLE CHEST - 1 VIEW  COMPARISON:  Chest radiograph performed 08/18/2014  FINDINGS: The lungs are well-aerated and clear.  There is no evidence of focal opacification, pleural effusion or pneumothorax.  The cardiomediastinal silhouette is within normal limits. No acute osseous abnormalities are seen. Cervical spinal fusion hardware is noted.  IMPRESSION: No acute cardiopulmonary process seen.   Electronically Signed   By: Garald Balding M.D.   On: 12/01/2014 22:51    ASSESSMENT: Right elbow pain secondary to pseudogout   PLAN: After discussing with the patient we elected to inject his right elbow with cortisone.  He was to use ice and elevation for any postinjection pain.  I will stop by and see him tomorrow if he is still in the hospital.  If not he will followup with [place] in one week to check on his progress.from an orthopedic standpoint he is cleared to go home as long as she is cleared by the medical team.  He has no restrictions with his elbow at this time.  Activities as tolerated.  Right elbow injection: After obtaining informed verbal consent with discussion of risks and benefits of the procedure the patients right elbow was prepped with Betadine and alcohol and draped appropriately.  We then used a 1-1/2 inch 25-gauge needle and injected the right elbow joint with 80 mg of Depo-Medrol and 2 cc of lidocaine.  The procedure was tolerated well and Band-Aid was applied.  We observed the patient for 10 minutes and no untoward effects were  experienced.    Dulcey Riederer, Larwance Sachs 12/02/2014, 5:56 PM

## 2014-12-02 NOTE — H&P (Signed)
[facility] History and Physical  Patient: Ricky Meyers  MRN: [id number]  DOB: 1945-12-30  DOS: the patient was seen and examined on 12/02/2014 PCP: [name], MD  Chief Complaint: right elbow pain  HPI: [name] is a 69 y.o. male with Past medical history of gout, hypertension, dyslipidemia, prostate cancer, chronic neck pain, chronic arthritis with back pain. The patient presents with complaints of sudden onset of right elbow pain. Patient was seen in the ER on 11/30/2014 morning with the complaint of left shoulder blade pain with muscle spasm there. Patient had that pain throughout the night of 26. He was evaluated with an MRI which was showing inflammatory changes without any acute abnormality and therefore was sent home on pain medication and muscle relaxant. After reaching home she started having pain on his right elbow and this was associated with severe swelling as well as redness and he was unable to move his right hand and therefore he stayed awake all night taking pain medication. Since his pain was not improving he decided to come to the hospital on 1/28 evening. Patient also had some fever. Chills. Denies any vomiting but had some nausea. Denies any burning urination. Complains of constipation. Denies any rash trauma or injury. Patient's wife thinks that the patient may have sleep apnea  The patient is coming from home. And at his baseline independent for most of his ADL.  Review of Systems: as mentioned in the history of present illness.  A Comprehensive review of the other systems is negative.  Past Medical History  Diagnosis Date  . Gout   . Hyperlipidemia   . Hypertension   . Depression   . Prostate cancer     history of  . Chronic neck pain   . ED (erectile dysfunction)   . Arthritis     neck and back    Past Surgical History  Procedure Laterality Date  . Prostatectomy    . Tonsillectomy    . Vasectomy    . Colonoscopy  12/22/06    per Dr.  Ardis Hughs, repeat in 10 yrs  . Cervical fusion  12/02/07    per Dr. Sherley Bounds   Social History:  reports that he has never smoked. He has never used smokeless tobacco. He reports that he drinks alcohol. He reports that he does not use illicit drugs.  No Known Allergies  Family History  Problem Relation Age of Onset  . Cancer Other     breast, porstate  . Hypertension Other   . Stroke Other     Prior to Admission medications   Medication Sig Start Date End Date Taking? Authorizing Provider  allopurinol (ZYLOPRIM) 100 MG tablet Take 1 tablet (100 mg total) by mouth daily. 12/16/13  Yes [name], MD  atorvastatin (LIPITOR) 40 MG tablet Take 1 tablet (40 mg total) by mouth daily. 12/16/13  Yes [name], MD  cyclobenzaprine (FLEXERIL) 10 MG tablet Take 10 mg by mouth 3 (three) times daily as needed for muscle spasms.   Yes Historical Provider, MD  diazepam (VALIUM) 5 MG tablet Take 2 tablets (10 mg total) by mouth every 12 (twelve) hours as needed for anxiety. 11/30/14  Yes Tatyana A Kirichenko, PA-C  diclofenac (VOLTAREN) 75 MG EC tablet Take 1 tablet (75 mg total) by mouth 2 (two) times daily. 12/16/13  Yes [name], MD  gabapentin (NEURONTIN) 100 MG capsule Take 100 mg by mouth 2 (two) times daily.   Yes Historical  Provider, MD  HYDROmorphone (DILAUDID) 8 MG tablet Take 1 tablet (8 mg total) by mouth every 4 (four) hours as needed for severe pain. 11/16/14  Yes [name], MD  lisinopril-hydrochlorothiazide (PRINZIDE,ZESTORETIC) 20-25 MG per tablet Take 1 tablet by mouth daily. 12/16/13 12/16/14 Yes [name], MD  nabumetone (RELAFEN) 500 MG tablet Take 500 mg by mouth 2 (two) times daily.   Yes Historical Provider, MD  Oxycodone HCl 10 MG TABS Take 10 mg by mouth every 6 (six) hours as needed (for pain).   Yes Historical Provider, MD  PARoxetine (PAXIL) 20 MG tablet Take 1 tablet (20 mg total) by mouth every morning. 12/16/13  Yes [name], MD    Physical Exam: Filed  Vitals:   12/02/14 0215 12/02/14 0315 12/02/14 0330 12/02/14 0500  BP: 121/73 153/62 121/72 161/76  Pulse: 77 84 78 75  Temp:    98.1 F (36.7 C)  TempSrc:    Oral  Resp: _0 Height:   5' 10.5" (1.791 m) 5' 10.5" (1.791 m)  Weight:   115.2 kg (253 lb 15.5 oz) 113.49 kg (250 lb 3.2 oz)  SpO2: 97% 97% 99% 97%    General: Alert, Awake and Oriented to Time, Place and Person. Appear in mild distress Eyes: PERRL ENT: Oral Mucosa clear moist. Neck: no JVD Cardiovascular: S1 and S2 Present, no Murmur, Peripheral Pulses Present Respiratory: Bilateral Air entry equal and Decreased, Clear to Auscultation, noCrackles, no wheezes Abdomen: Bowel Sound present, Soft and non tender Skin: no Rash Extremities: Right elbow swelling with decreased range of motion, no Pedal edema, no calf tenderness Neurologic: Grossly no focal neuro deficit.  Labs on Admission:  CBC:  Recent Labs Lab 11/30/14 0600 12/01/14 2223 12/02/14 0550  WBC 11.4* 11.3* 9.6  NEUTROABS  --  8.8*  --   HGB 14.5 14.7 12.3*  HCT 42.0 41.3 36.2*  MCV 89.6 89.2 91.0  PLT 201 223 221    CMP     Component Value Date/Time   NA 134* 12/02/2014 0550   K 3.7 12/02/2014 0550   CL 99 12/02/2014 0550   CO2 32 12/02/2014 0550   GLUCOSE 116* 12/02/2014 0550   BUN 13 12/02/2014 0550   CREATININE 0.93 12/02/2014 0550   CALCIUM 9.0 12/02/2014 0550   PROT 6.5 12/02/2014 0550   ALBUMIN 3.0* 12/02/2014 0550   AST 24 12/02/2014 0550   ALT 24 12/02/2014 0550   ALKPHOS 85 12/02/2014 0550   BILITOT 0.9 12/02/2014 0550   GFRNONAA 83* 12/02/2014 0550   GFRAA >90 12/02/2014 0550    No results for input(s): LIPASE, AMYLASE in the last 168 hours.  No results for input(s): CKTOTAL, CKMB, CKMBINDEX, TROPONINI in the last 168 hours. BNP (last 3 results) No results for input(s): PROBNP in the last 8760 hours.  Radiological Exams on Admission: Mr Cervical Spine Wo Contrast  11/30/2014   CLINICAL DATA:  LEFT shoulder pain.  Onset of symptoms yesterday. History of arthritis in the neck. No recent injury. Seven out of 10 pain. Initial encounter.  EXAM: MRI CERVICAL SPINE WITHOUT CONTRAST  TECHNIQUE: Multiplanar, multisequence MR imaging of the cervical spine was performed. No intravenous contrast was administered.  COMPARISON:  08/18/2014 radiographs.  Previous MRI 07/13/2007.  FINDINGS: Alignment: Mildly exaggerated cervical lordosis is present. This appears fixed by a C3 through C5 ACDF.  Vertebrae: There is new edema in the superior aspect of the odontoid. There is also bone marrow edema in the  lateral masses of C1 and extending through the anterior C1 arch. Small C1-C2 effusions are present. Mild thickening of the cruciform ligament of the atlas. All of these changes are new compared to the prior study of 07/13/2007.  Artifact from cervical spine fixation hardware extends from C3 through C5. Degenerative endplate changes are present at C5-C6. No cervical spine fracture is visible.  Additionally, bilateral facet effusions and right-greater-than-left facet arthritis are present at C2-C3 (image number 1 series 5, image 13 series 5).  Cord: No intramedullary lesion or cord edema.  Posterior Fossa: Empty sella incidentally noted.  Vertebral Arteries: Flow voids present bilaterally. LEFT dominant vertebral system.  Paraspinal tissues: No paraspinal fluid collections are identified. The prevertebral soft tissues are nearly within normal limits. There is some trace edema in the prevertebral soft tissues anterior to the odontoid and C1-C2 junction.  Disc levels:  C2-C3: There is no liquefaction of the disc her fluid signal in the disc space. Central canal is patent. There is RIGHT foraminal stenosis associated with facet arthrosis. Central canal and LEFT foramen patent.  C3-C4:  ACDF.  No recurrent stenosis.  C4-C5: ACDF.  No recurrent stenosis.  C5-C6: Mild central stenosis associated with shallow disc osteophyte complex. Bilateral foraminal  stenosis is present with bilateral uncovertebral spurring that produces bilateral foraminal stenosis. This potentially affects both C6 nerves.  C6-C7: Severe LEFT facet arthrosis and moderate RIGHT facet arthrosis. There is bilateral foraminal stenosis potentially affecting both C7 nerves. This is due to a combination of uncovertebral spurring and facet arthrosis. The central canal is patent.  C7-T1: LEFT facet arthritis is present. Trace anterolisthesis is probably due to the facet arthritis. Central canal appears adequately patent. There is mild LEFT foraminal stenosis secondary to facet arthrosis.  IMPRESSION: 1. Inflammatory changes in the odontoid and around the atlantodental joint and both C1-C2 facet joints. Differential considerations include inflammatory arthropathy such as rheumatoid or septic arthritis in this patient with elevated white blood cell count. No abscess or discitis. 2. Inflammatory changes also involve both C2-C3 facet joints, greater on the RIGHT than LEFT. Because of the adjacent fusion, the differential considerations also include degenerative arthritis however septic arthritis and inflammatory arthritis considerations still apply. 3. Adjacent segment disease at C5-C6 with mild central stenosis. 4. Bilateral foraminal stenosis at C5-C6 and C6-C7 potentially affecting the C6 and C7 nerves bilaterally. 5. LEFT C6-C7 facet arthritis, likely degenerative.   Electronically Signed   By: Dereck Ligas M.D.   On: 11/30/2014 12:18   Dg Chest Portable 1 View  12/01/2014   CLINICAL DATA:  Code sepsis.  Initial encounter.  EXAM: PORTABLE CHEST - 1 VIEW  COMPARISON:  Chest radiograph performed 08/18/2014  FINDINGS: The lungs are well-aerated and clear. There is no evidence of focal opacification, pleural effusion or pneumothorax.  The cardiomediastinal silhouette is within normal limits. No acute osseous abnormalities are seen. Cervical spinal fusion hardware is noted.  IMPRESSION: No acute  cardiopulmonary process seen.   Electronically Signed   By: Garald Balding M.D.   On: 12/01/2014 22:51   Assessment/Plan Principal Problem:   Pseudogout of elbow Active Problems:   Gout   Essential hypertension   Arthritis   Hyponatremia   Constipation   Suspected Septic arthritis   1. Pseudogout of elbow versus septic arthritis The patient is presenting with sudden onset of right elbow pain with swelling. He also had fever and chills. He has elevated CRP with 10 and elevated ESR. He has mild leukocytosis as well as  hyponatremia. With this presentation and arthrocentesis was performed and they were able to obtain 12 mL of fluid which was sent for workup. The fluid is positive for depression pyrophosphate crystals but it also has more than 50 neutrophils and more than 70,000 WBC. This picture is most likely consistent with pseudogout but with his fever or chills tachycardia possibility of septic arthritis cannot be ruled out. Patient will be empirically treated with vancomycin. We discussed with orthopedic. Patient would also started on naproxen twice a day.  2. Hyponatremia. Likely secondary to pain. Gentle hydration. Recheck in morning.  3. Constipation. Likely secondary to chronic pain medication. Bowel regimen ordered.  4. Essential hypertension. Continuing home medication.  5. Hypoxia occasionally. Patient will require sleep apnea evaluation as an outpatient.  Advance goals of care discussion: Full code   DVT Prophylaxis: subcutaneous Heparin Nutrition: Regular diet  Family Communication: Wife was present at bedside, opportunity was given to ask question and all questions were answered satisfactorily at the time of interview. Disposition: Admitted to inpatient in telemetry unit.  Author: Berle Mull, MD [facility] Pager: [phone] 12/02/2014, 7:05 AM    If 7PM-7AM, please contact night-coverage [email or web address] Password [email or web address]

## 2014-12-02 NOTE — Progress Notes (Signed)
ANTIBIOTIC CONSULT NOTE - INITIAL  Pharmacy Consult for vancomycin Indication: suspected septic arthritis  No Known Allergies  Patient Measurements: Weight: 115kg  Vital Signs: Temp: 98.1 F (36.7 C) (01/29 0101) Temp Source: Oral (01/29 0101) BP: 121/72 mmHg (01/29 0330) Pulse Rate: 78 (01/29 0330) Intake/Output from previous day: 01/28 0701 - 01/29 0700 In: 2000 [I.V.:2000] Out: -  Intake/Output from this shift: Total I/O In: 2000 [I.V.:2000] Out: -   Labs:  Recent Labs  11/30/14 0600 12/01/14 2223  WBC 11.4* 11.3*  HGB 14.5 14.7  PLT 201 223  CREATININE 1.24 1.06     Microbiology: Recent Results (from the past 720 hour(s))  Gram stain     Status: None   Collection Time: 12/02/14 12:25 AM  Result Value Ref Range Status   Specimen Description SYNOVIAL  Final   Special Requests Normal  Final   Gram Stain   Final    ABUNDANT WBC PRESENT,BOTH PMN AND MONONUCLEAR NO ORGANISMS SEEN CALLED TO [place] RN [id number] 12/02/14 E.GADDY    Report Status 12/02/2014 FINAL  Final    Medical History: Past Medical History  Diagnosis Date  . Gout   . Hyperlipidemia   . Hypertension   . Depression   . Prostate cancer     history of  . Chronic neck pain   . ED (erectile dysfunction)   . Arthritis     neck and back     Medications:  Prescriptions prior to admission  Medication Sig Dispense Refill Last Dose  . allopurinol (ZYLOPRIM) 100 MG tablet Take 1 tablet (100 mg total) by mouth daily. 90 tablet 3 12/01/2014 at Unknown time  . atorvastatin (LIPITOR) 40 MG tablet Take 1 tablet (40 mg total) by mouth daily. 90 tablet 3 12/01/2014 at Unknown time  . cyclobenzaprine (FLEXERIL) 10 MG tablet Take 10 mg by mouth 3 (three) times daily as needed for muscle spasms.   12/01/2014 at Unknown time  . diazepam (VALIUM) 5 MG tablet Take 2 tablets (10 mg total) by mouth every 12 (twelve) hours as needed for anxiety. 20 tablet 0 12/01/2014 at Unknown time  . diclofenac (VOLTAREN) 75 MG EC  tablet Take 1 tablet (75 mg total) by mouth 2 (two) times daily. 180 tablet 3 Past Week at Unknown time  . gabapentin (NEURONTIN) 100 MG capsule Take 100 mg by mouth 2 (two) times daily.   12/01/2014 at Unknown time  . HYDROmorphone (DILAUDID) 8 MG tablet Take 1 tablet (8 mg total) by mouth every 4 (four) hours as needed for severe pain. 60 tablet 0 12/01/2014 at Unknown time  . lisinopril-hydrochlorothiazide (PRINZIDE,ZESTORETIC) 20-25 MG per tablet Take 1 tablet by mouth daily. 90 tablet 3 12/01/2014 at Unknown time  . nabumetone (RELAFEN) 500 MG tablet Take 500 mg by mouth 2 (two) times daily.   12/01/2014 at Unknown time  . Oxycodone HCl 10 MG TABS Take 10 mg by mouth every 6 (six) hours as needed (for pain).   Past Week at Unknown time  . PARoxetine (PAXIL) 20 MG tablet Take 1 tablet (20 mg total) by mouth every morning. 90 tablet 3 12/01/2014 at Unknown time   Scheduled:  . allopurinol  100 mg Oral Daily  . atorvastatin  40 mg Oral Daily  . enoxaparin (LOVENOX) injection  40 mg Subcutaneous Q24H  . gabapentin  100 mg Oral BID  . naproxen  500 mg Oral BID WC  . pantoprazole (PROTONIX) IV  40 mg Intravenous Q24H  . PARoxetine  20  mg Oral Daily  . sodium chloride  3 mL Intravenous Q12H   Infusions:  . sodium chloride      Assessment: 69yo male w/ chronic pain c/o worsening shoulder pain over 2wk, had MRI 1/27 and was not febrile at the time so was discharged, now w/ temp to 101 and unable to move arm, to begin IV ABX for suspected infection.  Goal of Therapy:  Vancomycin trough level 15-20 mcg/ml  Plan:  Will give vancomycin 2000mg  IV x1 followed by 750mg  IV Q12H and monitor CBC, Cx, levels prn.  Wynona Neat, PharmD, BCPS  12/02/2014,4:14 AM

## 2014-12-03 DIAGNOSIS — D509 Iron deficiency anemia, unspecified: Secondary | ICD-10-CM | POA: Diagnosis present

## 2014-12-03 DIAGNOSIS — K5900 Constipation, unspecified: Secondary | ICD-10-CM

## 2014-12-03 MED ORDER — NABUMETONE 500 MG PO TABS: 500.0000 mg | ORAL_TABLET | Freq: Two times a day (BID) | ORAL | Status: DC

## 2014-12-03 MED ORDER — INDOMETHACIN 50 MG PO CAPS: 50.0000 mg | ORAL_CAPSULE | Freq: Three times a day (TID) | ORAL | Status: DC

## 2014-12-03 MED ORDER — POLYSACCHARIDE IRON COMPLEX 150 MG PO CAPS
150.0000 mg | ORAL_CAPSULE | Freq: Every day | ORAL | Status: DC
  Administered 2014-12-03: 150 mg via ORAL
  Filled 2014-12-03 (×2): qty 1

## 2014-12-03 MED ORDER — POLYSACCHARIDE IRON COMPLEX 150 MG PO CAPS: 150.0000 mg | ORAL_CAPSULE | Freq: Every day | ORAL | Status: DC

## 2014-12-03 MED ORDER — POLYETHYLENE GLYCOL 3350 17 G PO PACK
17.0000 g | PACK | Freq: Every day | ORAL | Status: DC
Start: 2014-12-03 — End: 2016-03-07

## 2014-12-03 MED ORDER — SENNOSIDES-DOCUSATE SODIUM 8.6-50 MG PO TABS: 1.0000 | ORAL_TABLET | Freq: Two times a day (BID) | ORAL | Status: DC

## 2014-12-03 MED ORDER — DICLOFENAC SODIUM 75 MG PO TBEC: 75.0000 mg | DELAYED_RELEASE_TABLET | Freq: Two times a day (BID) | ORAL | Status: DC

## 2014-12-03 MED ORDER — PANTOPRAZOLE SODIUM 40 MG PO TBEC: 40.0000 mg | DELAYED_RELEASE_TABLET | Freq: Every day | ORAL | Status: DC

## 2014-12-03 NOTE — Progress Notes (Signed)
Pt discharge instructions and prescriptions given. Pt verbalized understanding.  VSS. Denies pain.  Family at bedside.  Pt waiting on walker to be delivered.

## 2014-12-03 NOTE — Progress Notes (Signed)
Pt left floor via wheelchair accompanied by staff and family. 

## 2014-12-03 NOTE — Evaluation (Signed)
Physical Therapy Evaluation Patient Details Name: Ricky Meyers MRN: [id number] DOB: Feb 23, 1946 Today's Date: 12/03/2014   History of Present Illness  Pt adm with rt elbow pain and thought to have pseudogout. Received injection with significant relief. Pt's wife relates pt originally presented with head and neck pain to ED and went home but then returned with elbow pain.  Clinical Impression  Pt doing well with mobility and no further PT needed.  Ready for dc from PT standpoint.      Follow Up Recommendations No PT follow up    Equipment Recommendations  Rolling walker with 5" wheels    Recommendations for Other Services       Precautions / Restrictions Precautions Precautions: None Restrictions Weight Bearing Restrictions: No      Mobility  Bed Mobility Overal bed mobility: Modified Independent                Transfers Overall transfer level: Modified independent                  Ambulation/Gait Ambulation/Gait assistance: Supervision;Modified independent (Device/Increase time) Ambulation Distance (Feet): 250 Feet Assistive device: None;Rolling walker (2 wheeled) Gait Pattern/deviations: Step-through pattern;Decreased stride length   Gait velocity interpretation: Below normal speed for age/gender General Gait Details: Pt slightly unsteady with gait which improved with use of rolling walker.  Stairs            Wheelchair Mobility    Modified Rankin (Stroke Patients Only)       Balance Overall balance assessment: Needs assistance   Sitting balance-Leahy Scale: Normal       Standing balance-Leahy Scale: Good                               Pertinent Vitals/Pain Pain Assessment: No/denies pain    Home Living Family/patient expects to be discharged to:: Private residence Living Arrangements: Spouse/significant other Available Help at Discharge: Family Type of Home: House Home Access: Stairs to enter Entrance  Stairs-Rails: None Technical brewer of Steps: 4   Home Equipment: None      Prior Function Level of Independence: Independent               Hand Dominance        Extremity/Trunk Assessment   Upper Extremity Assessment: Overall WFL for tasks assessed           Lower Extremity Assessment: Overall WFL for tasks assessed         Communication   Communication: No difficulties  Cognition Arousal/Alertness: Awake/alert Behavior During Therapy: WFL for tasks assessed/performed Overall Cognitive Status: Within Functional Limits for tasks assessed                      General Comments      Exercises        Assessment/Plan    PT Assessment Patent does not need any further PT services  PT Diagnosis Difficulty walking   PT Problem List    PT Treatment Interventions     PT Goals (Current goals can be found in the Care Plan section) Acute Rehab PT Goals PT Goal Formulation: All assessment and education complete, DC therapy    Frequency     Barriers to discharge        Co-evaluation               End of Session Equipment Utilized During Treatment: Gait belt Activity Tolerance:  Patient tolerated treatment well Patient left: in chair;with call bell/phone within reach;with family/visitor present Nurse Communication: Mobility status;Other (comment) (wife able to assist pt with mobility while here.)         Time: 1039-1053 PT Time Calculation (min) (ACUTE ONLY): 14 min   Charges:   PT Evaluation $Initial PT Evaluation Tier I: 1 Procedure     PT G Codes:        Anik Wesch December 07, 2014, 11:23 AM  Suanne Marker PT [phone]

## 2014-12-03 NOTE — Progress Notes (Signed)
Subjective: Follow up on right elbow pseudogout    Patients right elbow pain is much improved today after cortisone injection yesterday. He is very pleased with how well his elbow feels.   Activity level:  wbat Diet tolerance:  Eating well Voiding:  ok Patient reports pain as mild.    Objective: Vital signs in last 24 hours: Temp:  [98 F (36.7 C)-98.6 F (37 C)] 98.5 F (36.9 C) (01/30 0518) Pulse Rate:  [66-75] 66 (01/30 0518) Resp:  [18] 18 (01/30 0518) BP: (102-136)/(73-74) 131/74 mmHg (01/30 0518) SpO2:  [93 %-99 %] 93 % (01/30 0518) Weight:  [115.214 kg (254 lb)] 115.214 kg (254 lb) (01/29 2007)  Labs:  Recent Labs  12/01/14 2223 12/02/14 0550  HGB 14.7 12.3*    Recent Labs  12/01/14 2223 12/02/14 0550  WBC 11.3* 9.6  RBC 4.63 3.98*  HCT 41.3 36.2*  PLT 223 221    Recent Labs  12/01/14 2223 12/02/14 0550  NA 130* 134*  K 3.9 3.7  CL 96 99  CO2 25 32  BUN 15 13  CREATININE 1.06 0.93  GLUCOSE 154* 116*  CALCIUM 9.8 9.0    Recent Labs  12/02/14 0550  INR 1.10    Physical Exam:  Neurologically intact ABD soft Neurovascular intact Sensation intact distally Intact pulses distally No cellulitis present Compartment soft  Assessment/Plan: Right elbow pseudogout improved with injection.  Patient is greatly improved after cortisone injection yesterday into elbow. At this point orthopaedics will sign off on him. He will follow up in our office in 1 week to check on his progress. Please feel free to contact us with any further questions.    Lamiya Naas, Larwance Sachs 12/03/2014, 8:07 AM

## 2014-12-03 NOTE — Discharge Summary (Signed)
Physician Discharge Summary  Ricky Meyers VOH:[id number] DOB: Jan 17, 1946 DOA: 12/01/2014  PCP: [name], MD  Admit date: 12/01/2014 Discharge date: 12/03/2014  Time spent: 65 minutes  Recommendations for Outpatient Follow-up: 1. Follow-up with Dr. Berenice Primas of orthopedics in 1 week for further evaluation of patient's right elbow pseudogout. 2. Follow-up with Dr. Ronnald Ramp of neurosurgery 1-2 weeks.  Discharge Diagnoses:  Principal Problem:   Pseudogout of elbow Active Problems:   Gout   Essential hypertension   Arthritis   Hyponatremia   Constipation   Suspected Septic arthritis   Nontraumatic pain and swelling of elbow   Anemia, iron deficiency   Discharge Condition: Stable and improved  Diet recommendation: Regular  Filed Weights   12/02/14 0330 12/02/14 0500 12/02/14 2007  Weight: 115.2 kg (253 lb 15.5 oz) 113.49 kg (250 lb 3.2 oz) 115.214 kg (254 lb)    History of present illness:  Ricky Meyers is a 69 y.o. male with Past medical history of gout, hypertension, dyslipidemia, prostate cancer, chronic neck pain, chronic arthritis with back pain. The patient presented with complaints of sudden onset of right elbow pain. Patient was seen in the ER on 11/30/2014 morning with the complaint of left shoulder blade pain with muscle spasm there. Patient had that pain throughout the night of 1/26. He was evaluated with an MRI which was showing inflammatory changes without any acute abnormality and therefore was sent home on pain medication and muscle relaxant. After reaching home he started having pain on his right elbow and this was associated with severe swelling as well as redness and he was unable to move his right hand and therefore he stayed awake all night taking pain medication. Since his pain was not improving he decided to come to the hospital on 1/28 evening. Patient also had some fever. Chills. Denied any vomiting but had some nausea. Denied any burning urination.  Complained of constipation. Denied any rash trauma or injury. Patient's wife thought that the patient may have sleep apnea.  The patient is coming from home. And at his baseline independent for most of his ADL.   Hospital Course:  #1 pseudogout of the right elbow Patient had presented with sudden onset of right elbow pain with erythema and swelling with some subjective fevers and chills. CRP which was done was elevated at 10 as well as the elevated sedimentation rate. Patient was also noted to have a mild leukocytosis. Patient underwent an arthrocentesis in the ED with well able to obtain 12 mm of fluid which was sent for Gram stain and culture, and crystal analysis. Results came back positive for calcium per phosphate crystals with border 50 neutrophils and more than 70,000 WBCs. It was felt this was likely secondary to his pseudogout however patient did have some chills and fever at home and it was some concern for septic arthritis. Patient was placed empirically on IV vancomycin as well as Indocin. Orthopedic consultation was obtained and patient was seen in consultation by Dr. Rhona Raider on 12/02/2014. It was felt that patient did indeed have a right elbow pseudogout. Patient received a right elbow injection with Depo-Medrol with significant clinical improvement. Patient range of motion improved. Patient was followed by orthopedics patient be discharged home on Indocin in stable and improved condition. Patient is to follow-up with orthopedics in one week. Patient will be discharged in stable and improved condition.   #2 hyponatremia Patient was noted to be hyponatremic on admission. Patient was hydrated with IV fluids with improvement in his  hyponatremia.  #3 iron deficiency anemia Patient was noted to be anemic on admission which was felt to be dilutional component. Anemia panel which was obtained at iron level of 13 ferritin of 1022 TIBC of 203. Patient was placed on Will follow-up with PCP as  outpatient.  #4 constipation Likely narcotic induced constipation as patient is on chronic pain medication. Patient was placed on a bowel regimen with resolution of his constipation. Patient be discharged on his bowel regimen.  #5 hypertension Remained stable. Patient was continued on his home regimen.  #6 occasional hypoxia Patient will likely need sleep study evaluation done as outpatient to rule out obstructive sleep apnea.  The rest of patient's chronic medical issues remained stable throughout the hospitalization and patient be discharged in stable and improved condition.  Procedures:  Right elbow injection with Depo-Medrol 12/02/2014 Dr. Rhona Raider  X-ray of the right elbow 12/02/2014  Chest x-ray 12/01/2014  Consultations:  Orthopedics: Dr Rhona Raider 12/02/14  Discharge Exam: Filed Vitals:   12/03/14 0935  BP: 132/79  Pulse: 87  Temp: 97.9 F (36.6 C)  Resp: 21    General: NAD Cardiovascular: RRR Respiratory: CTAB  Discharge Instructions   Discharge Instructions    Diet general    Complete by:  As directed      Discharge instructions    Complete by:  As directed   Follow up with Dr Berenice Primas of orhtopedics in 1 week. Follow up with Dr Ronnald Ramp in 1-2 weeks.     Increase activity slowly    Complete by:  As directed           Current Discharge Medication List    START taking these medications   Details  indomethacin (INDOCIN) 50 MG capsule Take 1 capsule (50 mg total) by mouth 3 (three) times daily with meals. Take for 1 week then stop. Qty: 21 capsule, Refills: 0    iron polysaccharides (NIFEREX) 150 MG capsule Take 1 capsule (150 mg total) by mouth daily. Qty: 30 capsule, Refills: 0    pantoprazole (PROTONIX) 40 MG tablet Take 1 tablet (40 mg total) by mouth at bedtime. Qty: 30 tablet, Refills: 0    polyethylene glycol (MIRALAX / GLYCOLAX) packet Take 17 g by mouth daily. Qty: 14 each, Refills: 0    senna-docusate (SENOKOT-S) 8.6-50 MG per tablet Take  1 tablet by mouth 2 (two) times daily.      CONTINUE these medications which have CHANGED   Details  diclofenac (VOLTAREN) 75 MG EC tablet Take 1 tablet (75 mg total) by mouth 2 (two) times daily. Resume in 1 week after you have finished course of indocin.    nabumetone (RELAFEN) 500 MG tablet Take 1 tablet (500 mg total) by mouth 2 (two) times daily. Resume in 1 week.      CONTINUE these medications which have NOT CHANGED   Details  allopurinol (ZYLOPRIM) 100 MG tablet Take 1 tablet (100 mg total) by mouth daily. Qty: 90 tablet, Refills: 3    atorvastatin (LIPITOR) 40 MG tablet Take 1 tablet (40 mg total) by mouth daily. Qty: 90 tablet, Refills: 3    cyclobenzaprine (FLEXERIL) 10 MG tablet Take 10 mg by mouth 3 (three) times daily as needed for muscle spasms.    diazepam (VALIUM) 5 MG tablet Take 2 tablets (10 mg total) by mouth every 12 (twelve) hours as needed for anxiety. Qty: 20 tablet, Refills: 0    gabapentin (NEURONTIN) 100 MG capsule Take 100 mg by mouth 2 (two) times daily.  HYDROmorphone (DILAUDID) 8 MG tablet Take 1 tablet (8 mg total) by mouth every 4 (four) hours as needed for severe pain. Qty: 60 tablet, Refills: 0    lisinopril-hydrochlorothiazide (PRINZIDE,ZESTORETIC) 20-25 MG per tablet Take 1 tablet by mouth daily. Qty: 90 tablet, Refills: 3    Oxycodone HCl 10 MG TABS Take 10 mg by mouth every 6 (six) hours as needed (for pain).    PARoxetine (PAXIL) 20 MG tablet Take 1 tablet (20 mg total) by mouth every morning. Qty: 90 tablet, Refills: 3       No Known Allergies Follow-up Information    Follow up with GRAVES,JOHN L, MD. Schedule an appointment as soon as possible for a visit in 1 week.   Specialty:  Orthopedic Surgery   Contact information:   [place] [address] [phone]       Follow up with [name], MD. Schedule an appointment as soon as possible for a visit in 1 week.   Specialty:  Neurosurgery   Why:  f/u in 1-2  weeks   Contact information:   [address] [place] [address] [phone]        The results of significant diagnostics from this hospitalization (including imaging, microbiology, ancillary and laboratory) are listed below for reference.    Significant Diagnostic Studies: Dg Elbow Complete Right  12/02/2014   CLINICAL DATA:  Acute right elbow head pain and swelling for 3 days  EXAM: RIGHT ELBOW - COMPLETE 3+ VIEW  COMPARISON:  None.  FINDINGS: Normal right elbow alignment with minor degenerative changes. no subluxation, dislocation, acute osseous finding or fracture. Mild soft tissue swelling suspected posteriorly. No large joint effusion appreciated.  IMPRESSION: Degenerative changes and posterior right elbow mild soft tissue swelling. No acute osseous finding.   Electronically Signed   By: Daryll Brod M.D.   On: 12/02/2014 10:10   Mr Cervical Spine Wo Contrast  11/30/2014   CLINICAL DATA:  LEFT shoulder pain. Onset of symptoms yesterday. History of arthritis in the neck. No recent injury. Seven out of 10 pain. Initial encounter.  EXAM: MRI CERVICAL SPINE WITHOUT CONTRAST  TECHNIQUE: Multiplanar, multisequence MR imaging of the cervical spine was performed. No intravenous contrast was administered.  COMPARISON:  08/18/2014 radiographs.  Previous MRI 07/13/2007.  FINDINGS: Alignment: Mildly exaggerated cervical lordosis is present. This appears fixed by a C3 through C5 ACDF.  Vertebrae: There is new edema in the superior aspect of the odontoid. There is also bone marrow edema in the lateral masses of C1 and extending through the anterior C1 arch. Small C1-C2 effusions are present. Mild thickening of the cruciform ligament of the atlas. All of these changes are new compared to the prior study of 07/13/2007.  Artifact from cervical spine fixation hardware extends from C3 through C5. Degenerative endplate changes are present at C5-C6. No cervical spine fracture is visible.   Additionally, bilateral facet effusions and right-greater-than-left facet arthritis are present at C2-C3 (image number 1 series 5, image 13 series 5).  Cord: No intramedullary lesion or cord edema.  Posterior Fossa: Empty sella incidentally noted.  Vertebral Arteries: Flow voids present bilaterally. LEFT dominant vertebral system.  Paraspinal tissues: No paraspinal fluid collections are identified. The prevertebral soft tissues are nearly within normal limits. There is some trace edema in the prevertebral soft tissues anterior to the odontoid and C1-C2 junction.  Disc levels:  C2-C3: There is no liquefaction of the disc her fluid signal in the disc space. Central  canal is patent. There is RIGHT foraminal stenosis associated with facet arthrosis. Central canal and LEFT foramen patent.  C3-C4:  ACDF.  No recurrent stenosis.  C4-C5: ACDF.  No recurrent stenosis.  C5-C6: Mild central stenosis associated with shallow disc osteophyte complex. Bilateral foraminal stenosis is present with bilateral uncovertebral spurring that produces bilateral foraminal stenosis. This potentially affects both C6 nerves.  C6-C7: Severe LEFT facet arthrosis and moderate RIGHT facet arthrosis. There is bilateral foraminal stenosis potentially affecting both C7 nerves. This is due to a combination of uncovertebral spurring and facet arthrosis. The central canal is patent.  C7-T1: LEFT facet arthritis is present. Trace anterolisthesis is probably due to the facet arthritis. Central canal appears adequately patent. There is mild LEFT foraminal stenosis secondary to facet arthrosis.  IMPRESSION: 1. Inflammatory changes in the odontoid and around the atlantodental joint and both C1-C2 facet joints. Differential considerations include inflammatory arthropathy such as rheumatoid or septic arthritis in this patient with elevated white blood cell count. No abscess or discitis. 2. Inflammatory changes also involve both C2-C3 facet joints, greater on  the RIGHT than LEFT. Because of the adjacent fusion, the differential considerations also include degenerative arthritis however septic arthritis and inflammatory arthritis considerations still apply. 3. Adjacent segment disease at C5-C6 with mild central stenosis. 4. Bilateral foraminal stenosis at C5-C6 and C6-C7 potentially affecting the C6 and C7 nerves bilaterally. 5. LEFT C6-C7 facet arthritis, likely degenerative.   Electronically Signed   By: Dereck Ligas M.D.   On: 11/30/2014 12:18   Dg Chest Portable 1 View  12/01/2014   CLINICAL DATA:  Code sepsis.  Initial encounter.  EXAM: PORTABLE CHEST - 1 VIEW  COMPARISON:  Chest radiograph performed 08/18/2014  FINDINGS: The lungs are well-aerated and clear. There is no evidence of focal opacification, pleural effusion or pneumothorax.  The cardiomediastinal silhouette is within normal limits. No acute osseous abnormalities are seen. Cervical spinal fusion hardware is noted.  IMPRESSION: No acute cardiopulmonary process seen.   Electronically Signed   By: Garald Balding M.D.   On: 12/01/2014 22:51    Microbiology: Recent Results (from the past 240 hour(s))  Body fluid culture     Status: None (Preliminary result)   Collection Time: 12/02/14 12:25 AM  Result Value Ref Range Status   Specimen Description SYNOVIAL  Final   Special Requests Normal  Final   Gram Stain   Final    ABUNDANT WBC PRESENT,BOTH PMN AND MONONUCLEAR NO ORGANISMS SEEN Gram Stain Report Called to,Read Back By and Verified With: Gram Stain Report Called to,Read Back By and Verified With: J.CAMP [id number] ON [id number] BY E.GADDY Performed at [facility] Performed at [place] Performed at [facility]   Final   Report Status PENDING  Incomplete  Gram stain     Status: None   Collection Time: 12/02/14 12:25 AM  Result Value Ref Range Status   Specimen Description SYNOVIAL  Final   Special Requests Normal  Final   Gram Stain    Final    ABUNDANT WBC PRESENT,BOTH PMN AND MONONUCLEAR NO ORGANISMS SEEN CALLED TO [place] RN [id number] 12/02/14 E.GADDY    Report Status 12/02/2014 FINAL  Final     Labs: Basic Metabolic Panel:  Recent Labs Lab 11/30/14 0600 12/01/14 2223 12/02/14 0550  NA 132* 130* 134*  K 3.9 3.9 3.7  CL 96 96 99  CO2 31 25 32  GLUCOSE 128* 154* 116*  BUN 16  15 13  CREATININE 1.24 1.06 0.93  CALCIUM 9.2 9.8 9.0  MG  --   --  1.7  PHOS  --   --  2.6   Liver Function Tests:  Recent Labs Lab 11/30/14 0600 12/02/14 0550  AST 25 24  ALT 27 24  ALKPHOS 102 85  BILITOT 0.9 0.9  PROT 7.3 6.5  ALBUMIN 3.9 3.0*   No results for input(s): LIPASE, AMYLASE in the last 168 hours. No results for input(s): AMMONIA in the last 168 hours. CBC:  Recent Labs Lab 11/30/14 0600 12/01/14 2223 12/02/14 0550  WBC 11.4* 11.3* 9.6  NEUTROABS  --  8.8*  --   HGB 14.5 14.7 12.3*  HCT 42.0 41.3 36.2*  MCV 89.6 89.2 91.0  PLT 201 223 221   Cardiac Enzymes: No results for input(s): CKTOTAL, CKMB, CKMBINDEX, TROPONINI in the last 168 hours. BNP: BNP (last 3 results) No results for input(s): PROBNP in the last 8760 hours. CBG:  Recent Labs Lab 12/02/14 0027  GLUCAP 127*       Signed:  THOMPSON,DANIEL MD [facility] 12/03/2014, 2:35 PM

## 2014-12-04 LAB — BODY FLUID CULTURE
Culture: NO GROWTH
SPECIAL REQUESTS: NORMAL

## 2014-12-05 ENCOUNTER — Other Ambulatory Visit: Payer: Self-pay | Admitting: Family Medicine

## 2014-12-07 ENCOUNTER — Ambulatory Visit (INDEPENDENT_AMBULATORY_CARE_PROVIDER_SITE_OTHER): Payer: Medicare Other | Admitting: Family Medicine

## 2014-12-07 ENCOUNTER — Encounter: Payer: Self-pay | Admitting: Family Medicine

## 2014-12-07 VITALS — BP 105/55 | HR 48 | Temp 97.9°F | Ht 70.5 in | Wt 241.0 lb

## 2014-12-07 DIAGNOSIS — M1990 Unspecified osteoarthritis, unspecified site: Secondary | ICD-10-CM | POA: Diagnosis not present

## 2014-12-07 DIAGNOSIS — M11822 Other specified crystal arthropathies, left elbow: Secondary | ICD-10-CM | POA: Diagnosis not present

## 2014-12-07 DIAGNOSIS — M542 Cervicalgia: Secondary | ICD-10-CM | POA: Diagnosis not present

## 2014-12-07 DIAGNOSIS — I10 Essential (primary) hypertension: Secondary | ICD-10-CM | POA: Diagnosis not present

## 2014-12-07 DIAGNOSIS — M11222 Other chondrocalcinosis, left elbow: Secondary | ICD-10-CM

## 2014-12-07 LAB — C-REACTIVE PROTEIN: CRP: 1.4 mg/dL (ref 0.5–20.0)

## 2014-12-07 LAB — SEDIMENTATION RATE: Sed Rate: 52 mm/hr — ABNORMAL HIGH (ref 0–22)

## 2014-12-07 LAB — RHEUMATOID FACTOR: Rhuematoid fact SerPl-aCnc: <10 IU/mL (ref <=14)

## 2014-12-07 NOTE — Progress Notes (Signed)
   Subjective:    Patient ID: Ricky Meyers, male    DOB: 1946/05/11, 69 y.o.   MRN: [id number]  HPI Here to follow up a hospital stay from 12-01-14 to 12-03-14 for a painful swollen right elbow. They did a workup for septic arthritis and this was negative. This turned out to be a pseudogout and he responded well to an injection of DepoMedrol. He currently has no pain in the elbow at all. However he still has a lot of pain in other joints, especially the spine. He has had a cervical spine MRI per Dr. Ronnald Ramp, his neurosurgeon, and this raised the question of a possible inflammatory arthritis. He is taking Diclofenac currently. He is scheduled for a lumbar spine MRI later this week and he will see Dr. Ronnald Ramp next week.    Review of Systems  Constitutional: Negative.   Respiratory: Negative.   Cardiovascular: Negative.   Musculoskeletal: Positive for back pain, arthralgias, gait problem, neck pain and neck stiffness.       Objective:   Physical Exam  Constitutional: He appears well-developed and well-nourished.  Cardiovascular: Normal rate, regular rhythm, normal heart sounds and intact distal pulses.   Pulmonary/Chest: Effort normal and breath sounds normal.  Musculoskeletal: He exhibits no edema.          Assessment & Plan:  Will draw labs today to look for rheumatoid arthritis and consider a possible referral to Rheumatology.

## 2014-12-07 NOTE — Progress Notes (Signed)
Pre visit review using our clinic review tool, if applicable. No additional management support is needed unless otherwise documented below in the visit note. 

## 2014-12-08 ENCOUNTER — Telehealth: Payer: Self-pay | Admitting: Family Medicine

## 2014-12-08 LAB — ANA: Anti Nuclear Antibody(ANA): NEGATIVE

## 2014-12-08 LAB — CULTURE, BLOOD (ROUTINE X 2)
CULTURE: NO GROWTH
CULTURE: NO GROWTH

## 2014-12-08 NOTE — Telephone Encounter (Signed)
emmi emailed °

## 2014-12-11 ENCOUNTER — Other Ambulatory Visit: Payer: No Typology Code available for payment source

## 2014-12-11 ENCOUNTER — Ambulatory Visit
Admission: RE | Admit: 2014-12-11 | Discharge: 2014-12-11 | Disposition: A | Discharge status: Home / Self Care | Payer: No Typology Code available for payment source | Source: Ambulatory Visit | Attending: Neurological Surgery | Admitting: Neurological Surgery

## 2014-12-11 DIAGNOSIS — M47816 Spondylosis without myelopathy or radiculopathy, lumbar region: Secondary | ICD-10-CM

## 2014-12-11 DIAGNOSIS — M4806 Spinal stenosis, lumbar region: Secondary | ICD-10-CM | POA: Diagnosis not present

## 2014-12-11 DIAGNOSIS — R609 Edema, unspecified: Secondary | ICD-10-CM | POA: Diagnosis not present

## 2014-12-11 DIAGNOSIS — M4317 Spondylolisthesis, lumbosacral region: Secondary | ICD-10-CM | POA: Diagnosis not present

## 2014-12-11 DIAGNOSIS — Z981 Arthrodesis status: Secondary | ICD-10-CM | POA: Diagnosis not present

## 2014-12-11 DIAGNOSIS — M4802 Spinal stenosis, cervical region: Secondary | ICD-10-CM | POA: Diagnosis not present

## 2014-12-11 DIAGNOSIS — M4313 Spondylolisthesis, cervicothoracic region: Secondary | ICD-10-CM | POA: Diagnosis not present

## 2014-12-11 DIAGNOSIS — M542 Cervicalgia: Secondary | ICD-10-CM

## 2014-12-13 ENCOUNTER — Telehealth: Payer: Self-pay | Admitting: Family Medicine

## 2014-12-13 NOTE — Telephone Encounter (Signed)
Pt would a call back about lab results

## 2014-12-14 NOTE — Telephone Encounter (Signed)
Pt has appointment with neuro on Friday 12/16/14. He would like to pick up a copy of these results, please call when ready.

## 2014-12-15 NOTE — Telephone Encounter (Signed)
I spoke with pt about lab results and printed a copy for him to pick up.

## 2014-12-16 DIAGNOSIS — Z6834 Body mass index (BMI) 34.0-34.9, adult: Secondary | ICD-10-CM | POA: Diagnosis not present

## 2014-12-16 DIAGNOSIS — M542 Cervicalgia: Secondary | ICD-10-CM | POA: Diagnosis not present

## 2014-12-26 ENCOUNTER — Telehealth: Payer: Self-pay | Admitting: Family Medicine

## 2014-12-26 NOTE — Telephone Encounter (Signed)
Refill request for Allopurinol 100 mg and send to [facility].

## 2014-12-27 MED ORDER — ALLOPURINOL 100 MG PO TABS: 100.0000 mg | ORAL_TABLET | Freq: Every day | ORAL | Status: DC

## 2014-12-27 NOTE — Telephone Encounter (Signed)
I sent script e-scribe. 

## 2015-01-03 ENCOUNTER — Other Ambulatory Visit: Payer: Self-pay | Admitting: Family Medicine

## 2015-01-03 MED ORDER — PANTOPRAZOLE SODIUM 40 MG PO TBEC: 40.0000 mg | DELAYED_RELEASE_TABLET | Freq: Every day | ORAL | Status: DC

## 2015-01-03 NOTE — Telephone Encounter (Signed)
Pt rx request for protonix 40mg  qty: 30  Pharmacy: [place], [place]

## 2015-01-03 NOTE — Telephone Encounter (Signed)
rx sent in electronically 

## 2015-01-23 ENCOUNTER — Ambulatory Visit (INDEPENDENT_AMBULATORY_CARE_PROVIDER_SITE_OTHER): Payer: Medicare Other | Admitting: Family Medicine

## 2015-01-23 ENCOUNTER — Encounter: Payer: Self-pay | Admitting: Family Medicine

## 2015-01-23 VITALS — BP 111/72 | HR 70 | Temp 98.3°F | Ht 70.5 in | Wt 251.0 lb

## 2015-01-23 DIAGNOSIS — I10 Essential (primary) hypertension: Secondary | ICD-10-CM | POA: Diagnosis not present

## 2015-01-23 DIAGNOSIS — M1990 Unspecified osteoarthritis, unspecified site: Secondary | ICD-10-CM

## 2015-01-23 MED ORDER — DICLOFENAC SODIUM 75 MG PO TBEC: 75.0000 mg | DELAYED_RELEASE_TABLET | Freq: Two times a day (BID) | ORAL | Status: DC

## 2015-01-23 MED ORDER — GABAPENTIN 100 MG PO CAPS: 100.0000 mg | ORAL_CAPSULE | Freq: Two times a day (BID) | ORAL | Status: DC

## 2015-01-23 MED ORDER — PANTOPRAZOLE SODIUM 40 MG PO TBEC: 40.0000 mg | DELAYED_RELEASE_TABLET | Freq: Every day | ORAL | Status: DC

## 2015-01-23 MED ORDER — LISINOPRIL-HYDROCHLOROTHIAZIDE 20-25 MG PO TABS
1.0000 | ORAL_TABLET | Freq: Every day | ORAL | Status: DC
Start: 2015-01-23 — End: 2016-02-26

## 2015-01-23 MED ORDER — CYCLOBENZAPRINE HCL 10 MG PO TABS: 10.0000 mg | ORAL_TABLET | Freq: Three times a day (TID) | ORAL | Status: DC | PRN

## 2015-01-23 MED ORDER — PAROXETINE HCL 20 MG PO TABS: 20.0000 mg | ORAL_TABLET | ORAL | Status: DC

## 2015-01-23 MED ORDER — ATORVASTATIN CALCIUM 40 MG PO TABS: 40.0000 mg | ORAL_TABLET | Freq: Every day | ORAL | Status: DC

## 2015-01-23 NOTE — Progress Notes (Signed)
   Subjective:    Patient ID: Ricky Meyers, male    DOB: 03-18-1946, 69 y.o.   MRN: [id number]  HPI Here for refills, to check his BP, and to discuss his severe lower back and right hip pain. He has degenerative arthritis in both hips and he has significant disc disease in the lower spine. Dr. Ronnald Ramp offered to do surgery but Ricky Meyers prefers to exhaust all medical avenues first. He is taking one oxycodone pill a day in addition to Diclofenac. He asks to be referred to a rheumatologist.    Review of Systems  Constitutional: Negative.   Respiratory: Negative.   Cardiovascular: Negative.   Musculoskeletal: Positive for back pain, arthralgias and gait problem.       Objective:   Physical Exam  Constitutional: He appears well-developed and well-nourished.  Cardiovascular: Normal rate, regular rhythm, normal heart sounds and intact distal pulses.   Pulmonary/Chest: Effort normal and breath sounds normal.  Musculoskeletal:  He walks slowly with pain           Assessment & Plan:  HTN is stable. I told him to increase the oxycodone to 2 or 3 pills a day as needed. Refer to Rheumatology.

## 2015-01-23 NOTE — Progress Notes (Signed)
Pre visit review using our clinic review tool, if applicable. No additional management support is needed unless otherwise documented below in the visit note. 

## 2015-01-25 ENCOUNTER — Encounter: Payer: Self-pay | Admitting: Family Medicine

## 2015-03-01 DIAGNOSIS — M150 Primary generalized (osteo)arthritis: Secondary | ICD-10-CM | POA: Diagnosis not present

## 2015-03-01 DIAGNOSIS — M1A09X0 Idiopathic chronic gout, multiple sites, without tophus (tophi): Secondary | ICD-10-CM | POA: Diagnosis not present

## 2015-03-01 DIAGNOSIS — M4806 Spinal stenosis, lumbar region: Secondary | ICD-10-CM | POA: Diagnosis not present

## 2015-03-01 DIAGNOSIS — M5136 Other intervertebral disc degeneration, lumbar region: Secondary | ICD-10-CM | POA: Diagnosis not present

## 2015-03-06 DIAGNOSIS — M4806 Spinal stenosis, lumbar region: Secondary | ICD-10-CM | POA: Diagnosis not present

## 2015-03-08 DIAGNOSIS — M4806 Spinal stenosis, lumbar region: Secondary | ICD-10-CM | POA: Diagnosis not present

## 2015-03-13 DIAGNOSIS — M4806 Spinal stenosis, lumbar region: Secondary | ICD-10-CM | POA: Diagnosis not present

## 2015-03-15 DIAGNOSIS — M4806 Spinal stenosis, lumbar region: Secondary | ICD-10-CM | POA: Diagnosis not present

## 2015-03-27 DIAGNOSIS — M4806 Spinal stenosis, lumbar region: Secondary | ICD-10-CM | POA: Diagnosis not present

## 2015-03-29 DIAGNOSIS — M4806 Spinal stenosis, lumbar region: Secondary | ICD-10-CM | POA: Diagnosis not present

## 2015-04-04 DIAGNOSIS — M4806 Spinal stenosis, lumbar region: Secondary | ICD-10-CM | POA: Diagnosis not present

## 2015-04-05 ENCOUNTER — Encounter: Payer: Self-pay | Admitting: Family Medicine

## 2015-04-06 MED ORDER — OXYCODONE HCL 10 MG PO TABS: 10.0000 mg | ORAL_TABLET | Freq: Four times a day (QID) | ORAL | Status: DC | PRN

## 2015-04-06 NOTE — Telephone Encounter (Signed)
done

## 2015-04-10 ENCOUNTER — Other Ambulatory Visit: Payer: Self-pay | Admitting: Family Medicine

## 2015-04-10 DIAGNOSIS — M4806 Spinal stenosis, lumbar region: Secondary | ICD-10-CM | POA: Diagnosis not present

## 2015-04-12 DIAGNOSIS — M4806 Spinal stenosis, lumbar region: Secondary | ICD-10-CM | POA: Diagnosis not present

## 2015-04-12 DIAGNOSIS — M5136 Other intervertebral disc degeneration, lumbar region: Secondary | ICD-10-CM | POA: Diagnosis not present

## 2015-04-12 DIAGNOSIS — M150 Primary generalized (osteo)arthritis: Secondary | ICD-10-CM | POA: Diagnosis not present

## 2015-04-12 DIAGNOSIS — M1A09X0 Idiopathic chronic gout, multiple sites, without tophus (tophi): Secondary | ICD-10-CM | POA: Diagnosis not present

## 2015-05-01 ENCOUNTER — Other Ambulatory Visit: Payer: Self-pay

## 2015-05-15 ENCOUNTER — Encounter: Payer: Self-pay | Admitting: Family Medicine

## 2015-05-16 MED ORDER — OXYCODONE HCL 10 MG PO TABS: 10.0000 mg | ORAL_TABLET | Freq: Four times a day (QID) | ORAL | Status: DC | PRN

## 2015-05-16 NOTE — Telephone Encounter (Signed)
done

## 2015-06-29 ENCOUNTER — Encounter: Payer: Self-pay | Admitting: Family Medicine

## 2015-06-30 MED ORDER — OXYCODONE HCL 10 MG PO TABS: 10.0000 mg | ORAL_TABLET | Freq: Four times a day (QID) | ORAL | Status: DC | PRN

## 2015-06-30 NOTE — Telephone Encounter (Signed)
done

## 2015-07-11 ENCOUNTER — Encounter: Payer: Self-pay | Admitting: Family Medicine

## 2015-07-11 NOTE — Telephone Encounter (Signed)
We will evaluate this the day of his visit

## 2015-07-17 ENCOUNTER — Encounter: Payer: Self-pay | Admitting: Family Medicine

## 2015-07-17 ENCOUNTER — Ambulatory Visit (INDEPENDENT_AMBULATORY_CARE_PROVIDER_SITE_OTHER): Payer: Medicare Other | Admitting: Family Medicine

## 2015-07-17 VITALS — BP 134/100 | HR 66 | Temp 98.2°F | Ht 70.5 in | Wt 258.0 lb

## 2015-07-17 DIAGNOSIS — I10 Essential (primary) hypertension: Secondary | ICD-10-CM

## 2015-07-17 DIAGNOSIS — K5900 Constipation, unspecified: Secondary | ICD-10-CM

## 2015-07-17 DIAGNOSIS — Z23 Encounter for immunization: Secondary | ICD-10-CM

## 2015-07-17 DIAGNOSIS — M1990 Unspecified osteoarthritis, unspecified site: Secondary | ICD-10-CM | POA: Diagnosis not present

## 2015-07-17 MED ORDER — SILDENAFIL CITRATE 100 MG PO TABS: 50.0000 mg | ORAL_TABLET | ORAL | Status: DC | PRN

## 2015-07-17 NOTE — Progress Notes (Signed)
   Subjective:    Patient ID: Ricky Meyers, male    DOB: [birth year], 69 y.o.   MRN: [id number]  HPI Here for an immunization update and to ask about his medications. It sounds like he has Nabumetone, Diclofenac, and Indomethacin around his home and he takes all of them for arthritis pains. He sees Dr. Amil Amen for this. He takes 2 or 3 Oxycodone tablets a day as well. His GERD has been stable. His BP has been well controlled.    Review of Systems  Constitutional: Negative.   Respiratory: Negative.   Cardiovascular: Negative.   Gastrointestinal: Negative.   Musculoskeletal: Positive for back pain and arthralgias.       Objective:   Physical Exam  Constitutional: He appears well-developed and well-nourished.  Neck: No thyromegaly present.  Cardiovascular: Normal rate, regular rhythm, normal heart sounds and intact distal pulses.   Pulmonary/Chest: Effort normal and breath sounds normal.  Lymphadenopathy:    He has no cervical adenopathy.          Assessment & Plan:  He will follow up with Dr, Amil Amen for the arthritis and the gout. I told him he should pick only one of the NSAIDs to use for pain and to get rid of the other two. His GERD is stable. His HTN is stable.

## 2015-07-17 NOTE — Progress Notes (Signed)
Pre visit review using our clinic review tool, if applicable. No additional management support is needed unless otherwise documented below in the visit note. 

## 2015-08-14 ENCOUNTER — Encounter: Payer: Self-pay | Admitting: Family Medicine

## 2015-08-15 MED ORDER — OXYCODONE HCL 10 MG PO TABS: 10.0000 mg | ORAL_TABLET | Freq: Four times a day (QID) | ORAL | Status: DC | PRN

## 2015-08-15 NOTE — Telephone Encounter (Signed)
done

## 2015-09-18 DIAGNOSIS — N183 Chronic kidney disease, stage 3 (moderate): Secondary | ICD-10-CM | POA: Diagnosis not present

## 2015-09-18 DIAGNOSIS — M1A09X0 Idiopathic chronic gout, multiple sites, without tophus (tophi): Secondary | ICD-10-CM | POA: Diagnosis not present

## 2015-09-18 DIAGNOSIS — M4806 Spinal stenosis, lumbar region: Secondary | ICD-10-CM | POA: Diagnosis not present

## 2015-09-18 DIAGNOSIS — M150 Primary generalized (osteo)arthritis: Secondary | ICD-10-CM | POA: Diagnosis not present

## 2015-09-18 DIAGNOSIS — M5136 Other intervertebral disc degeneration, lumbar region: Secondary | ICD-10-CM | POA: Diagnosis not present

## 2015-10-04 ENCOUNTER — Other Ambulatory Visit: Payer: Self-pay | Admitting: Family Medicine

## 2015-10-04 ENCOUNTER — Encounter: Payer: Self-pay | Admitting: Family Medicine

## 2015-10-05 MED ORDER — OXYCODONE HCL 10 MG PO TABS: 10.0000 mg | ORAL_TABLET | Freq: Four times a day (QID) | ORAL | Status: DC | PRN

## 2015-10-05 NOTE — Telephone Encounter (Signed)
done

## 2015-10-06 ENCOUNTER — Other Ambulatory Visit: Payer: Self-pay | Admitting: Family Medicine

## 2015-10-06 ENCOUNTER — Encounter: Payer: Self-pay | Admitting: Family Medicine

## 2015-10-06 NOTE — Telephone Encounter (Signed)
done

## 2015-10-06 NOTE — Telephone Encounter (Signed)
We did this yesterday

## 2015-10-25 ENCOUNTER — Encounter: Payer: Self-pay | Admitting: Family Medicine

## 2015-10-25 ENCOUNTER — Ambulatory Visit (INDEPENDENT_AMBULATORY_CARE_PROVIDER_SITE_OTHER): Payer: Medicare Other | Admitting: Family Medicine

## 2015-10-25 VITALS — BP 146/79 | HR 82 | Temp 98.9°F | Ht 70.5 in | Wt 264.0 lb

## 2015-10-25 DIAGNOSIS — J019 Acute sinusitis, unspecified: Secondary | ICD-10-CM | POA: Diagnosis not present

## 2015-10-25 MED ORDER — AMOXICILLIN-POT CLAVULANATE 875-125 MG PO TABS: 1.0000 | ORAL_TABLET | Freq: Two times a day (BID) | ORAL | Status: DC

## 2015-10-25 NOTE — Progress Notes (Signed)
   Subjective:    Patient ID: Ricky Meyers, male    DOB: 09-06-1946, 69 y.o.   MRN: [id number]  HPI Here for 2 weeks of sinus pressure, PND, ST, and a dry cough. On Mucinex.    Review of Systems  Constitutional: Negative.   HENT: Positive for congestion, postnasal drip, sinus pressure and sore throat.   Eyes: Negative.   Respiratory: Positive for cough.        Objective:   Physical Exam  Constitutional: He appears well-developed and well-nourished.  HENT:  Right Ear: External ear normal.  Left Ear: External ear normal.  Nose: Nose normal.  Mouth/Throat: Oropharynx is clear and moist.  Eyes: Conjunctivae are normal.  Neck: Neck supple. No thyromegaly present.  Pulmonary/Chest: Effort normal and breath sounds normal.  Lymphadenopathy:    He has no cervical adenopathy.          Assessment & Plan:  Sinusitis, treat with Augmentin.

## 2015-10-25 NOTE — Progress Notes (Signed)
Pre visit review using our clinic review tool, if applicable. No additional management support is needed unless otherwise documented below in the visit note. 

## 2015-11-14 ENCOUNTER — Encounter: Payer: Self-pay | Admitting: Family Medicine

## 2015-11-14 MED ORDER — OXYCODONE HCL 10 MG PO TABS: 10.0000 mg | ORAL_TABLET | Freq: Four times a day (QID) | ORAL | Status: DC | PRN

## 2015-11-14 MED ORDER — AMOXICILLIN-POT CLAVULANATE 875-125 MG PO TABS: 1.0000 | ORAL_TABLET | Freq: Two times a day (BID) | ORAL | Status: DC

## 2015-11-14 NOTE — Telephone Encounter (Signed)
Oxycodone rx was written. Also call in Augmentin [phone] bid for 10 more days

## 2015-12-03 ENCOUNTER — Emergency Department (HOSPITAL_COMMUNITY): Payer: Medicare Other

## 2015-12-03 ENCOUNTER — Emergency Department (HOSPITAL_COMMUNITY)
Admission: EM | Admit: 2015-12-03 | Discharge: 2015-12-03 | Disposition: A | Discharge status: Home / Self Care | Payer: Medicare Other | Attending: Emergency Medicine | Admitting: Emergency Medicine

## 2015-12-03 ENCOUNTER — Encounter (HOSPITAL_COMMUNITY): Payer: Self-pay | Admitting: Neurology

## 2015-12-03 DIAGNOSIS — Z8639 Personal history of other endocrine, nutritional and metabolic disease: Secondary | ICD-10-CM | POA: Insufficient documentation

## 2015-12-03 DIAGNOSIS — M79675 Pain in left toe(s): Secondary | ICD-10-CM | POA: Diagnosis not present

## 2015-12-03 DIAGNOSIS — Z87438 Personal history of other diseases of male genital organs: Secondary | ICD-10-CM | POA: Insufficient documentation

## 2015-12-03 DIAGNOSIS — Z8546 Personal history of malignant neoplasm of prostate: Secondary | ICD-10-CM | POA: Insufficient documentation

## 2015-12-03 DIAGNOSIS — G8929 Other chronic pain: Secondary | ICD-10-CM | POA: Diagnosis not present

## 2015-12-03 DIAGNOSIS — Z79899 Other long term (current) drug therapy: Secondary | ICD-10-CM | POA: Insufficient documentation

## 2015-12-03 DIAGNOSIS — M47812 Spondylosis without myelopathy or radiculopathy, cervical region: Secondary | ICD-10-CM | POA: Insufficient documentation

## 2015-12-03 DIAGNOSIS — Z792 Long term (current) use of antibiotics: Secondary | ICD-10-CM | POA: Diagnosis not present

## 2015-12-03 DIAGNOSIS — I10 Essential (primary) hypertension: Secondary | ICD-10-CM | POA: Insufficient documentation

## 2015-12-03 DIAGNOSIS — M1008 Idiopathic gout, vertebrae: Secondary | ICD-10-CM | POA: Insufficient documentation

## 2015-12-03 DIAGNOSIS — M109 Gout, unspecified: Secondary | ICD-10-CM | POA: Diagnosis not present

## 2015-12-03 DIAGNOSIS — M542 Cervicalgia: Secondary | ICD-10-CM | POA: Diagnosis present

## 2015-12-03 DIAGNOSIS — F329 Major depressive disorder, single episode, unspecified: Secondary | ICD-10-CM | POA: Diagnosis not present

## 2015-12-03 DIAGNOSIS — Z791 Long term (current) use of non-steroidal anti-inflammatories (NSAID): Secondary | ICD-10-CM | POA: Insufficient documentation

## 2015-12-03 LAB — CBC WITH DIFFERENTIAL/PLATELET
BASOS ABS: 0 10*3/uL (ref 0.0–0.1)
BASOS PCT: 0 %
Eosinophils Absolute: 0.1 10*3/uL (ref 0.0–0.7)
Eosinophils Relative: 1 %
HEMATOCRIT: 40.1 % (ref 39.0–52.0)
Hemoglobin: 13.3 g/dL (ref 13.0–17.0)
LYMPHS PCT: 12 %
Lymphs Abs: 1 10*3/uL (ref 0.7–4.0)
MCH: 30.5 pg (ref 26.0–34.0)
MCHC: 33.2 g/dL (ref 30.0–36.0)
MCV: 92 fL (ref 78.0–100.0)
Monocytes Absolute: 1.5 10*3/uL — ABNORMAL HIGH (ref 0.1–1.0)
Monocytes Relative: 17 %
NEUTROS ABS: 5.9 10*3/uL (ref 1.7–7.7)
Neutrophils Relative %: 70 %
Platelets: 199 10*3/uL (ref 150–400)
RBC: 4.36 MIL/uL (ref 4.22–5.81)
RDW: 13.4 % (ref 11.5–15.5)
WBC: 8.5 10*3/uL (ref 4.0–10.5)

## 2015-12-03 LAB — URINALYSIS, ROUTINE W REFLEX MICROSCOPIC
GLUCOSE, UA: NEGATIVE mg/dL
Hgb urine dipstick: NEGATIVE
Ketones, ur: NEGATIVE mg/dL
LEUKOCYTES UA: NEGATIVE
Nitrite: NEGATIVE
PH: 7 (ref 5.0–8.0)
PROTEIN: 30 mg/dL — AB
Specific Gravity, Urine: 1.023 (ref 1.005–1.030)

## 2015-12-03 LAB — COMPREHENSIVE METABOLIC PANEL
ALT: 22 U/L (ref 17–63)
ANION GAP: 12 (ref 5–15)
AST: 23 U/L (ref 15–41)
Albumin: 3.5 g/dL (ref 3.5–5.0)
Alkaline Phosphatase: 65 U/L (ref 38–126)
BILIRUBIN TOTAL: 1 mg/dL (ref 0.3–1.2)
BUN: 16 mg/dL (ref 6–20)
CO2: 29 mmol/L (ref 22–32)
Calcium: 9.5 mg/dL (ref 8.9–10.3)
Chloride: 93 mmol/L — ABNORMAL LOW (ref 101–111)
Creatinine, Ser: 1.28 mg/dL — ABNORMAL HIGH (ref 0.61–1.24)
GFR calc non Af Amer: 55 mL/min — ABNORMAL LOW (ref >60)
Glucose, Bld: 129 mg/dL — ABNORMAL HIGH (ref 65–99)
POTASSIUM: 4.1 mmol/L (ref 3.5–5.1)
Sodium: 134 mmol/L — ABNORMAL LOW (ref 135–145)
TOTAL PROTEIN: 7.2 g/dL (ref 6.5–8.1)

## 2015-12-03 LAB — URINE MICROSCOPIC-ADD ON

## 2015-12-03 LAB — URIC ACID: Uric Acid, Serum: 4.8 mg/dL (ref 4.4–7.6)

## 2015-12-03 MED ORDER — LORAZEPAM 2 MG/ML IJ SOLN
1.0000 mg | Freq: Once | INTRAMUSCULAR | Status: AC
  Administered 2015-12-03: 1 mg via INTRAVENOUS
  Filled 2015-12-03: qty 1

## 2015-12-03 MED ORDER — SODIUM CHLORIDE 0.9 % IV SOLN: INTRAVENOUS | Status: DC

## 2015-12-03 MED ORDER — SODIUM CHLORIDE 0.9 % IV BOLUS (SEPSIS)
500.0000 mL | Freq: Once | INTRAVENOUS | Status: AC
  Administered 2015-12-03: 500 mL via INTRAVENOUS

## 2015-12-03 MED ORDER — KETOROLAC TROMETHAMINE 30 MG/ML IJ SOLN
15.0000 mg | Freq: Once | INTRAMUSCULAR | Status: AC
  Administered 2015-12-03: 15 mg via INTRAVENOUS
  Filled 2015-12-03: qty 1

## 2015-12-03 MED ORDER — HYDROMORPHONE HCL 2 MG PO TABS: 2.0000 mg | ORAL_TABLET | Freq: Four times a day (QID) | ORAL | Status: DC | PRN

## 2015-12-03 MED ORDER — HYDROMORPHONE HCL 1 MG/ML IJ SOLN
1.0000 mg | Freq: Once | INTRAMUSCULAR | Status: AC
  Administered 2015-12-03: 1 mg via INTRAVENOUS
  Filled 2015-12-03: qty 1

## 2015-12-03 MED ORDER — COLCHICINE 0.6 MG PO TABS: 0.6000 mg | ORAL_TABLET | Freq: Two times a day (BID) | ORAL | Status: DC

## 2015-12-03 MED ORDER — ONDANSETRON HCL 4 MG/2ML IJ SOLN
4.0000 mg | Freq: Once | INTRAMUSCULAR | Status: AC
  Administered 2015-12-03: 4 mg via INTRAVENOUS
  Filled 2015-12-03: qty 2

## 2015-12-03 MED ORDER — INDOMETHACIN 25 MG PO CAPS: 50.0000 mg | ORAL_CAPSULE | Freq: Three times a day (TID) | ORAL | Status: DC

## 2015-12-03 NOTE — ED Notes (Signed)
Pt here with gout and arthritis that has been bad for 3 days. C/o gout to left great toe. Arthritis behind both ears, left shoulder, and neck. Pain is so bad it is hard to walk.

## 2015-12-03 NOTE — Discharge Instructions (Signed)

## 2015-12-03 NOTE — ED Notes (Signed)
Ativan for MRI claustrophobia.

## 2015-12-03 NOTE — ED Notes (Signed)
Patient transported to MRI 

## 2015-12-03 NOTE — ED Provider Notes (Signed)
CSN: [id number]     Arrival date & time 12/03/15  0745 History   First MD Initiated Contact with Patient 12/03/15 0757     Chief Complaint  Patient presents with  . Gout  . Arthritis      HPI Patient with a history of gout is currently taking allopurinol and also has chronic pain issues comes in with severe neck pain for the last 3 days.  Also pain and redness to the metacarpophalangeal joint of the left great toe.  Had admission to the hospital proximal and one year ago. Past Medical History  Diagnosis Date  . Gout   . Hyperlipidemia   . Hypertension   . Depression   . Prostate cancer [facility])     history of  . Chronic neck pain   . ED (erectile dysfunction)   . Arthritis     neck and back    Past Surgical History  Procedure Laterality Date  . Prostatectomy    . Tonsillectomy    . Vasectomy    . Colonoscopy  12/22/06    per Dr. Ardis Hughs, repeat in 10 yrs  . Cervical fusion  12/02/07    per Dr. Sherley Bounds   Family History  Problem Relation Age of Onset  . Cancer Other     breast, porstate  . Hypertension Other   . Stroke Other    Social History  Substance Use Topics  . Smoking status: Never Smoker   . Smokeless tobacco: Never Used  . Alcohol Use: 0.0 oz/week    0 Standard drinks or equivalent per week     Comment: occ    Review of Systems  All other systems reviewed and are negative  Allergies  Review of patient's allergies indicates no known allergies.  Home Medications   Prior to Admission medications   Medication Sig Start Date End Date Taking? Authorizing Provider  allopurinol (ZYLOPRIM) 100 MG tablet TAKE 1 TABLET BY MOUTH EVERY DAY Patient taking differently: take 300 mg every day 04/10/15  Yes [name], MD  amoxicillin-clavulanate (AUGMENTIN) 875-125 MG tablet Take 1 tablet by mouth 2 (two) times daily. 11/14/15  Yes [name], MD  cyclobenzaprine (FLEXERIL) 10 MG tablet Take 1 tablet (10 mg total) by mouth 3 (three) times daily as needed for  muscle spasms. 01/23/15  Yes [name], MD  dextromethorphan-guaiFENesin [facility] DM) 30-600 MG 12hr tablet Take 1 tablet by mouth daily as needed for cough.   Yes Historical Provider, MD  diclofenac (VOLTAREN) 75 MG EC tablet TAKE ONE TABLET BY MOUTH TWICE DAILY 12/05/14  Yes [name], MD  lisinopril-hydrochlorothiazide (PRINZIDE,ZESTORETIC) 20-25 MG per tablet Take 1 tablet by mouth daily. 01/23/15 01/23/16 Yes [name], MD  pantoprazole (PROTONIX) 40 MG tablet Take 1 tablet (40 mg total) by mouth at bedtime. 01/23/15  Yes [name], MD  PARoxetine (PAXIL) 20 MG tablet Take 1 tablet (20 mg total) by mouth every morning. 01/23/15  Yes [name], MD  psyllium (METAMUCIL) 58.6 % packet Take 1 packet by mouth daily as needed (for constipation).    Yes Historical Provider, MD  colchicine 0.6 MG tablet Take 1 tablet (0.6 mg total) by mouth 2 (two) times daily. Take for 5 days 12/03/15   Leonard Schwartz, MD  diazepam (VALIUM) 5 MG tablet Take 2 tablets (10 mg total) by mouth every 12 (twelve) hours as needed for anxiety. Patient not taking: Reported on 12/03/2015 11/30/14   Jeannett Senior, PA-C  gabapentin (NEURONTIN) 100 MG capsule Take 1 capsule (100 mg total) by mouth 2 (two) times daily. Patient not taking: Reported on 12/03/2015 01/23/15   [name], MD  HYDROmorphone (DILAUDID) 2 MG tablet Take 1 tablet (2 mg total) by mouth every 6 (six) hours as needed for severe pain. 12/03/15   Leonard Schwartz, MD  indomethacin (INDOCIN) 25 MG capsule Take 2 capsules (50 mg total) by mouth 3 (three) times daily with meals. Take for 1 week then stop. 12/03/15   Leonard Schwartz, MD  iron polysaccharides (NIFEREX) 150 MG capsule Take 1 capsule (150 mg total) by mouth daily. Patient not taking: Reported on 07/17/2015 12/03/14   Eugenie Filler, MD  nabumetone (RELAFEN) 500 MG tablet Take 1 tablet (500 mg total) by mouth 2 (two) times daily. Resume in 1 week. Patient not taking: Reported on 12/03/2015  12/10/14   Eugenie Filler, MD  polyethylene glycol [facility] Floria Raveling) packet Take 17 g by mouth daily. Patient not taking: Reported on 12/03/2015 12/03/14   Eugenie Filler, MD  senna-docusate (SENOKOT-S) 8.6-50 MG per tablet Take 1 tablet by mouth 2 (two) times daily. Patient not taking: Reported on 12/07/2014 12/03/14   Eugenie Filler, MD   BP 137/85 mmHg  Pulse 82  Temp(Src) 98.3 F (36.8 C) (Oral)  Resp 16  SpO2 88% Physical Exam  Constitutional: He is oriented to person, place, and time. He appears well-developed and well-nourished. No distress.  HENT:  Head: Normocephalic and atraumatic.  Eyes: Pupils are equal, round, and reactive to light.  Neck:    Pain to palpation and movement in the area were noted.  Cardiovascular: Normal rate and intact distal pulses.   Pulmonary/Chest: No respiratory distress.  Abdominal: Normal appearance. He exhibits no distension.  Musculoskeletal: Normal range of motion.  Neurological: He is alert and oriented to person, place, and time. No cranial nerve deficit.  Skin: Skin is warm and dry. No rash noted.  Psychiatric: He has a normal mood and affect. His behavior is normal.  Nursing note and vitals reviewed.   ED Course  Procedures (including critical care time) Medications  0.9 %  sodium chloride infusion (not administered)  HYDROmorphone (DILAUDID) injection 1 mg (1 mg Intravenous Given 12/03/15 0817)  ondansetron (ZOFRAN) injection 4 mg (4 mg Intravenous Given 12/03/15 0816)  sodium chloride 0.9 % bolus 500 mL (0 mLs Intravenous Stopped 12/03/15 0817)  LORazepam (ATIVAN) injection 1 mg (1 mg Intravenous Given 12/03/15 0828)  ketorolac (TORADOL) 30 MG/ML injection 15 mg (15 mg Intravenous Given 12/03/15 1001)    Labs Review Labs Reviewed  COMPREHENSIVE METABOLIC PANEL - Abnormal; Notable for the following:    Sodium 134 (*)    Chloride 93 (*)    Glucose, Bld 129 (*)    Creatinine, Ser 1.28 (*)    GFR calc non Af Amer 55 (*)     All other components within normal limits  CBC WITH DIFFERENTIAL/PLATELET - Abnormal; Notable for the following:    Monocytes Absolute 1.5 (*)    All other components within normal limits  URINALYSIS, ROUTINE W REFLEX MICROSCOPIC (NOT AT [facility]) - Abnormal; Notable for the following:    Color, Urine AMBER (*)    Bilirubin Urine SMALL (*)    Protein, ur 30 (*)    All other components within normal limits  URINE MICROSCOPIC-ADD ON - Abnormal; Notable for the following:    Squamous Epithelial / LPF 0-5 (*)    Bacteria, UA RARE (*)  All other components within normal limits  URIC ACID    Imaging Review Mr Cervical Spine Wo Contrast  12/03/2015  CLINICAL DATA:  Neck pain and left shoulder pain.  History of gout. EXAM: MRI CERVICAL SPINE WITHOUT CONTRAST TECHNIQUE: Multiplanar, multisequence MR imaging of the cervical spine was performed. No intravenous contrast was administered. COMPARISON:  12/11/2014.  11/30/2014.  07/13/2007. FINDINGS: The study Sever's from motion degradation. Foramen magnum is widely patent. There is chronic arthropathy at the C1-2 articulation and of both C2-3 facet joints. This could be osteoarthritis or relate to the patient's diagnosis of gout. No canal stenosis. Foraminal narrowing at the C2-3 level right worse than left. There has been previous ACDF from C3 through C5 which has a good appearance. Solid fusion with sufficient patency of the canal and foramina. C5-6: Spondylosis with endplate osteophytes and bulging of the disc but no compressive narrowing of the central canal. Mild facet arthritis bilaterally. Mild foraminal stenosis bilaterally. C6-7: Mild spondylosis and facet degeneration without evidence of significant canal or foraminal stenosis. C7-T1: Mild spondylosis without central canal narrowing. Facet arthropathy left worse than right with mild edema on the left. This could be associated with left neck pain. IMPRESSION: Similar appearance to the previous exams.  Arthropathy of the C1-2 articulation which could be degenerative or related to gout. This could be associated with neck pain. No neural encroachment. Facet arthritis bilaterally at C2-3, C5-6, C6-7 and C7-T1 which appears more typical of degenerative arthritis. Some edema of the facet joints at C2-3 and on the left at C7-T1 suggests that these joints are more likely to be painful. Satisfactory appearance in the fusion segment from C3 through C5. Non-compressive disc spondylosis at C5-6, C6-7 and C7-T1. Electronically Signed   By: Nelson Chimes M.D.   On: 12/03/2015 09:57   I have personally reviewed and evaluated these images and lab results as part of my medical decision-making.    MDM   Final diagnoses:  Gout of vertebrae, unspecified cause, unspecified chronicity        Leonard Schwartz, MD 12/03/15 1245

## 2015-12-05 ENCOUNTER — Encounter: Payer: Self-pay | Admitting: Family Medicine

## 2015-12-05 MED ORDER — INDOMETHACIN 50 MG PO CAPS: 50.0000 mg | ORAL_CAPSULE | Freq: Three times a day (TID) | ORAL | Status: DC

## 2015-12-05 NOTE — Telephone Encounter (Signed)
Call in Indomethacin 50 mg TID prn gout, #90 with 2 rf

## 2015-12-05 NOTE — Telephone Encounter (Signed)
I sent script e-scribe and spoke with pt. 

## 2015-12-07 DIAGNOSIS — K219 Gastro-esophageal reflux disease without esophagitis: Secondary | ICD-10-CM | POA: Diagnosis not present

## 2015-12-07 DIAGNOSIS — H9193 Unspecified hearing loss, bilateral: Secondary | ICD-10-CM | POA: Diagnosis not present

## 2015-12-07 DIAGNOSIS — R1314 Dysphagia, pharyngoesophageal phase: Secondary | ICD-10-CM | POA: Diagnosis not present

## 2015-12-25 ENCOUNTER — Encounter: Payer: Self-pay | Admitting: Family Medicine

## 2015-12-26 MED ORDER — OXYCODONE HCL 10 MG PO TABS: 10.0000 mg | ORAL_TABLET | Freq: Four times a day (QID) | ORAL | Status: DC

## 2015-12-26 NOTE — Telephone Encounter (Signed)
Done for 3 months

## 2016-01-01 IMAGING — MR MR CERVICAL SPINE W/O CM
4 of 5 series · 17 of 48 positions shown · non-contrast
Comparison: 08/18/2014 radiographs.  Previous MRI 07/13/2007.

CLINICAL DATA: LEFT shoulder pain. Onset of symptoms yesterday.
History of arthritis in the neck. No recent injury. Seven [DATE]
pain. Initial encounter.

EXAM:
MRI CERVICAL SPINE WITHOUT CONTRAST
TECHNIQUE: Multiplanar, multisequence MR imaging of the cervical spine was
performed. No intravenous contrast was administered.

[Series 2: T2 · sagittal · 3.0mm · 0.39mm/px · 8 of 14 slices shown (1 of 2)]
[im 1/14]
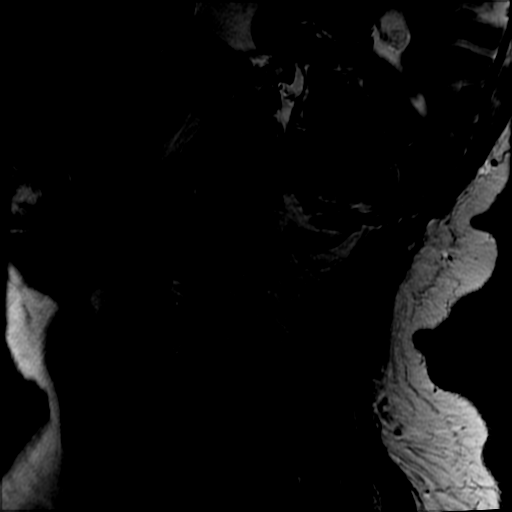
[im 2/14]
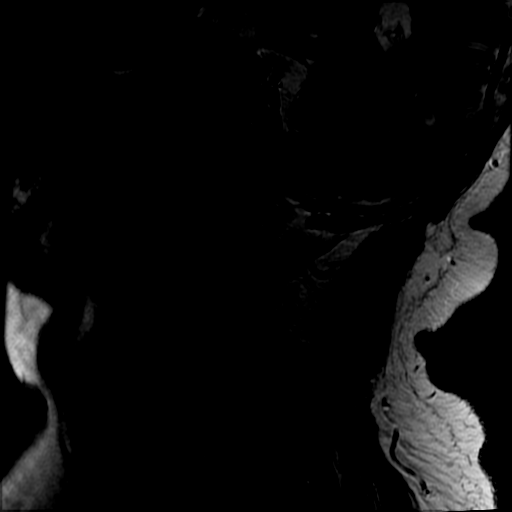
[im 4/14]
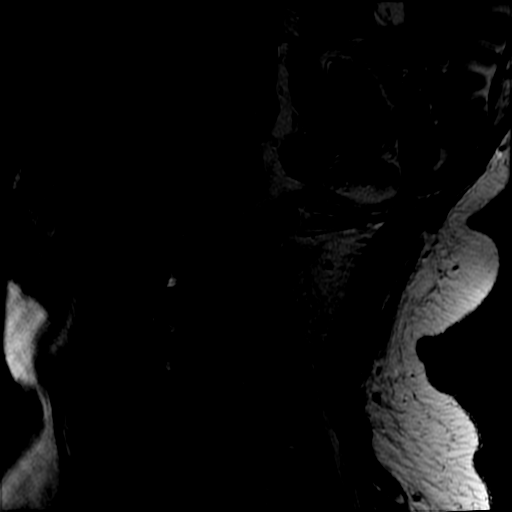
[im 6/14]
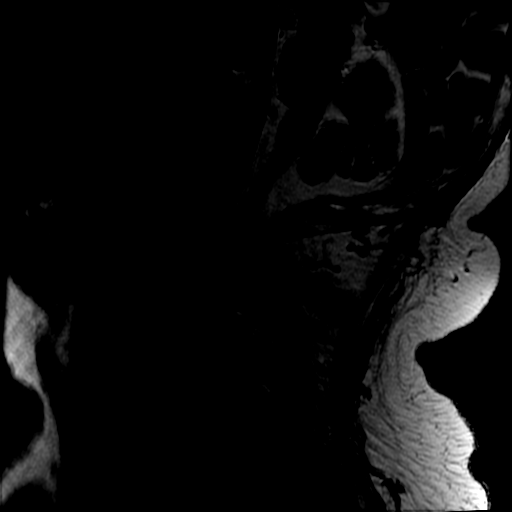
[im 8/14]
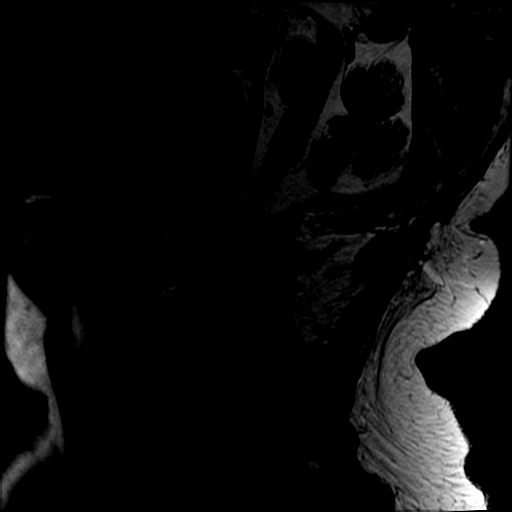
[im 10/14]
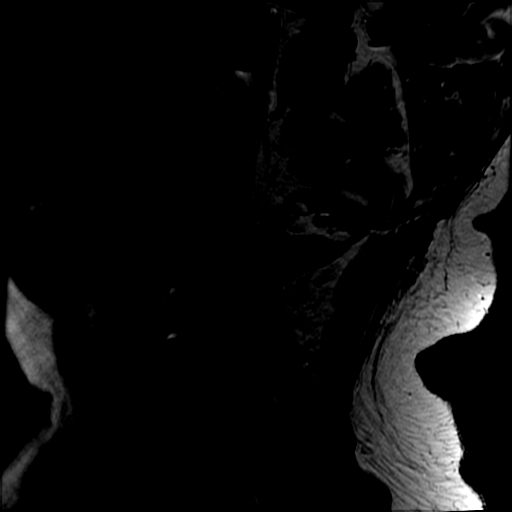
[im 12/14]
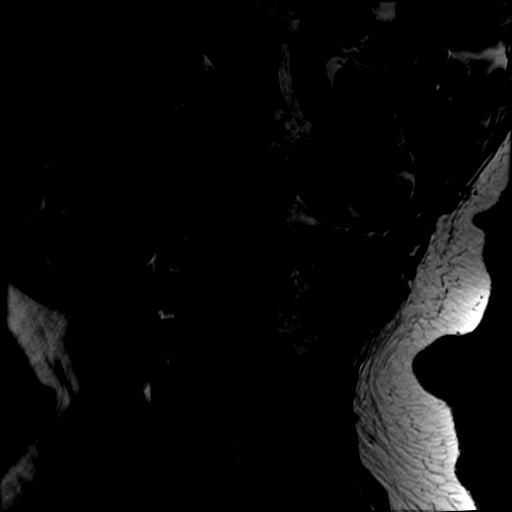
[im 14/14]
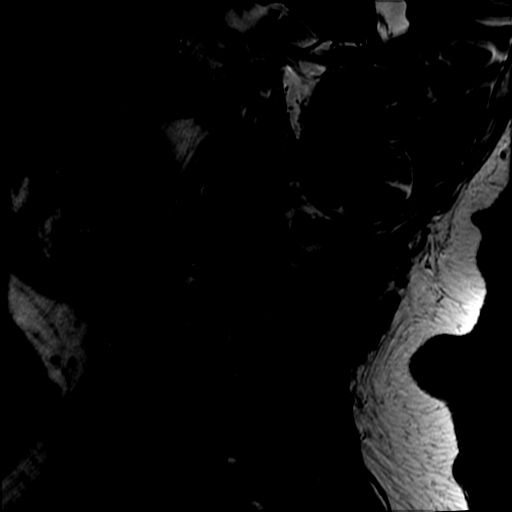

[Series 4: T1 · sagittal · 3.0mm · 0.39mm/px · 3 of 14 slices shown]
[im 3/14]
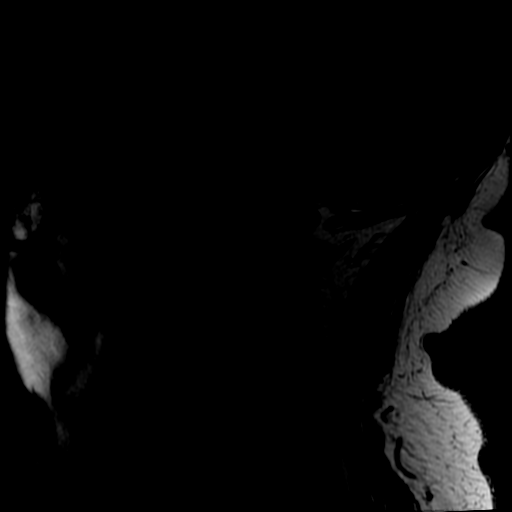
[im 7/14]
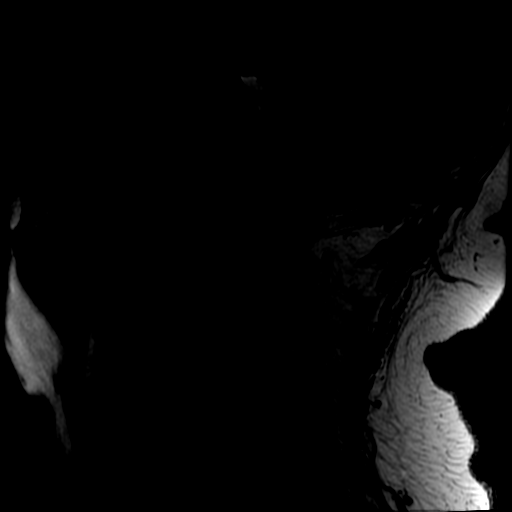
[im 11/14]
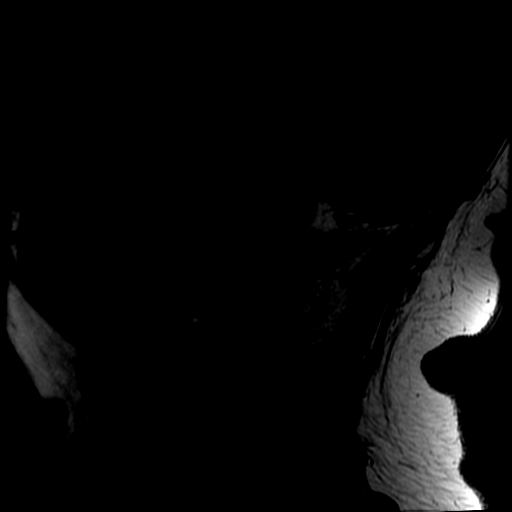

[Series 5: STIR · sagittal · 3.0mm · 0.39mm/px · 3 of 14 slices shown]
[im 3/14]
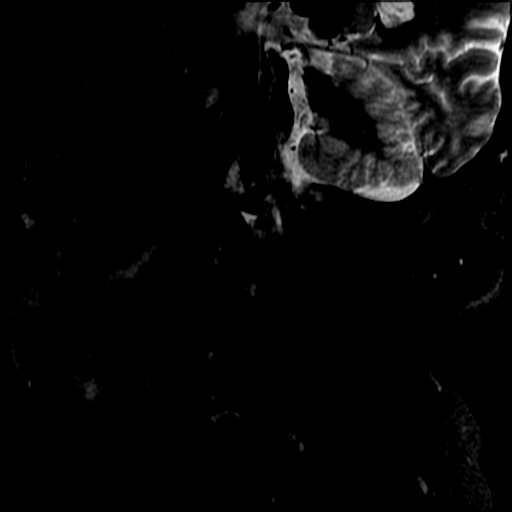
[im 7/14]
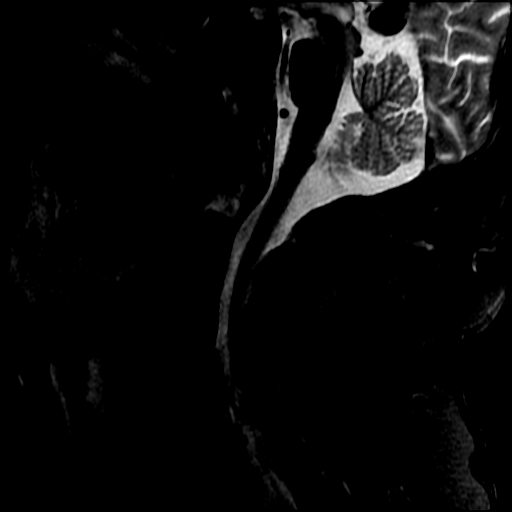
[im 11/14]
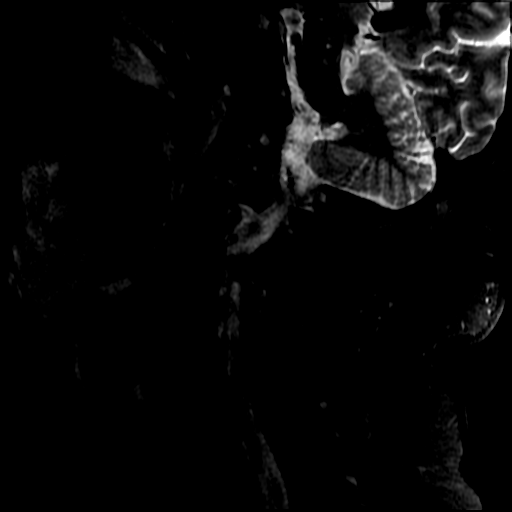

[Series 7: T2 · axial · 3.0mm · 0.35mm/px · z∈[-115,-66]mm · 3 of 26 slices shown (2 of 2)]
[im 5/26]
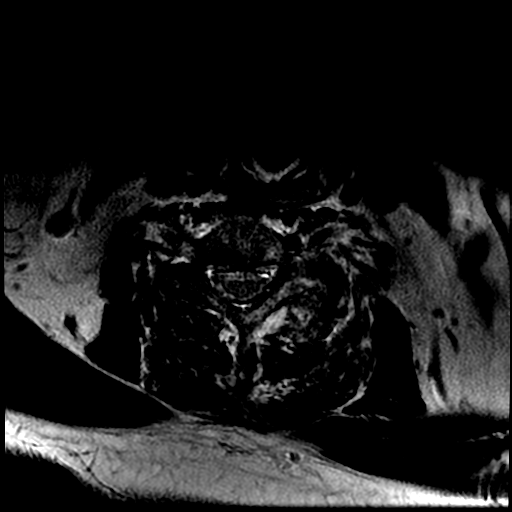
[im 13/26]
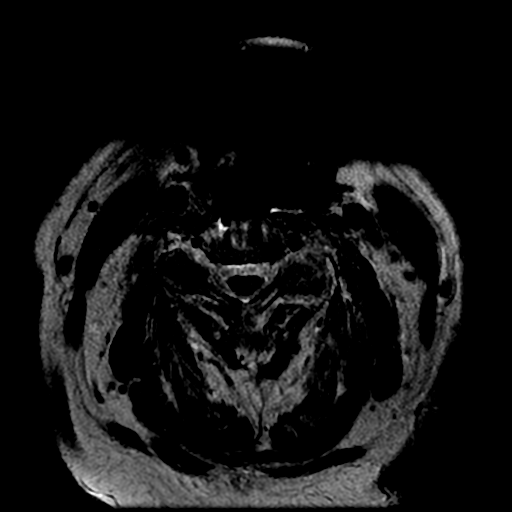
[im 21/26]
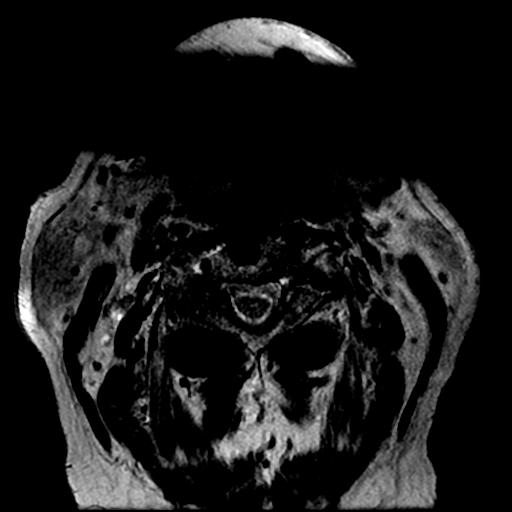

[17 of 48 positions shown; findings below may reference images not displayed]

FINDINGS: Alignment: Mildly exaggerated cervical lordosis is present. This
appears fixed by a C3 through C5 ACDF.

Vertebrae: There is new edema in the superior aspect of the
odontoid. There is also bone marrow edema in the lateral masses of
C1 and extending through the anterior C1 arch. Small C1-C2 effusions
are present. Mild thickening of the cruciform ligament of the atlas.
All of these changes are new compared to the prior study of
07/13/2007.

Artifact from cervical spine fixation hardware extends from C3
through C5. Degenerative endplate changes are present at C5-C6. No
cervical spine fracture is visible.

Additionally, bilateral facet effusions and right-greater-than-left
facet arthritis are present at C2-C3 (image number 1 series 5, image
13 series 5).

Cord: No intramedullary lesion or cord edema.

Posterior Fossa: Empty sella incidentally noted.

Vertebral Arteries: Flow voids present bilaterally. LEFT dominant
vertebral system.

Paraspinal tissues: No paraspinal fluid collections are identified.
The prevertebral soft tissues are nearly within normal limits. There
is some trace edema in the prevertebral soft tissues anterior to the
odontoid and C1-C2 junction.

Disc levels:

C2-C3: There is no liquefaction of the disc her fluid signal in the
disc space. Central canal is patent. There is RIGHT foraminal
stenosis associated with facet arthrosis. Central canal and LEFT
foramen patent.

C3-C4:  ACDF.  No recurrent stenosis.

C4-C5: ACDF.  No recurrent stenosis.

C5-C6: Mild central stenosis associated with shallow disc osteophyte
complex. Bilateral foraminal stenosis is present with bilateral
uncovertebral spurring that produces bilateral foraminal stenosis.
This potentially affects both C6 nerves.

C6-C7: Severe LEFT facet arthrosis and moderate RIGHT facet
arthrosis. There is bilateral foraminal stenosis potentially
affecting both C7 nerves. This is due to a combination of
uncovertebral spurring and facet arthrosis. The central canal is
patent.

C7-T1: LEFT facet arthritis is present. Trace anterolisthesis is
probably due to the facet arthritis. Central canal appears
adequately patent. There is mild LEFT foraminal stenosis secondary
to facet arthrosis.
IMPRESSION: 1. Inflammatory changes in the odontoid and around the atlantodental
joint and both C1-C2 facet joints. Differential considerations
include inflammatory arthropathy such as rheumatoid or septic
arthritis in this patient with elevated white blood cell count. No
abscess or discitis.
2. Inflammatory changes also involve both C2-C3 facet joints,
greater on the RIGHT than LEFT. Because of the adjacent fusion, the
differential considerations also include degenerative arthritis
however septic arthritis and inflammatory arthritis considerations
still apply.
3. Adjacent segment disease at C5-C6 with mild central stenosis.
4. Bilateral foraminal stenosis at C5-C6 and C6-C7 potentially
affecting the C6 and C7 nerves bilaterally.
5. LEFT C6-C7 facet arthritis, likely degenerative.

## 2016-01-03 IMAGING — DX DG ELBOW COMPLETE 3+V*R*
4 series · 4 of 4 positions shown · non-contrast
Comparison: None.

CLINICAL DATA: Acute right elbow head pain and swelling for 3 days

EXAM:
RIGHT ELBOW - COMPLETE 3+ VIEW

[elbow ap]
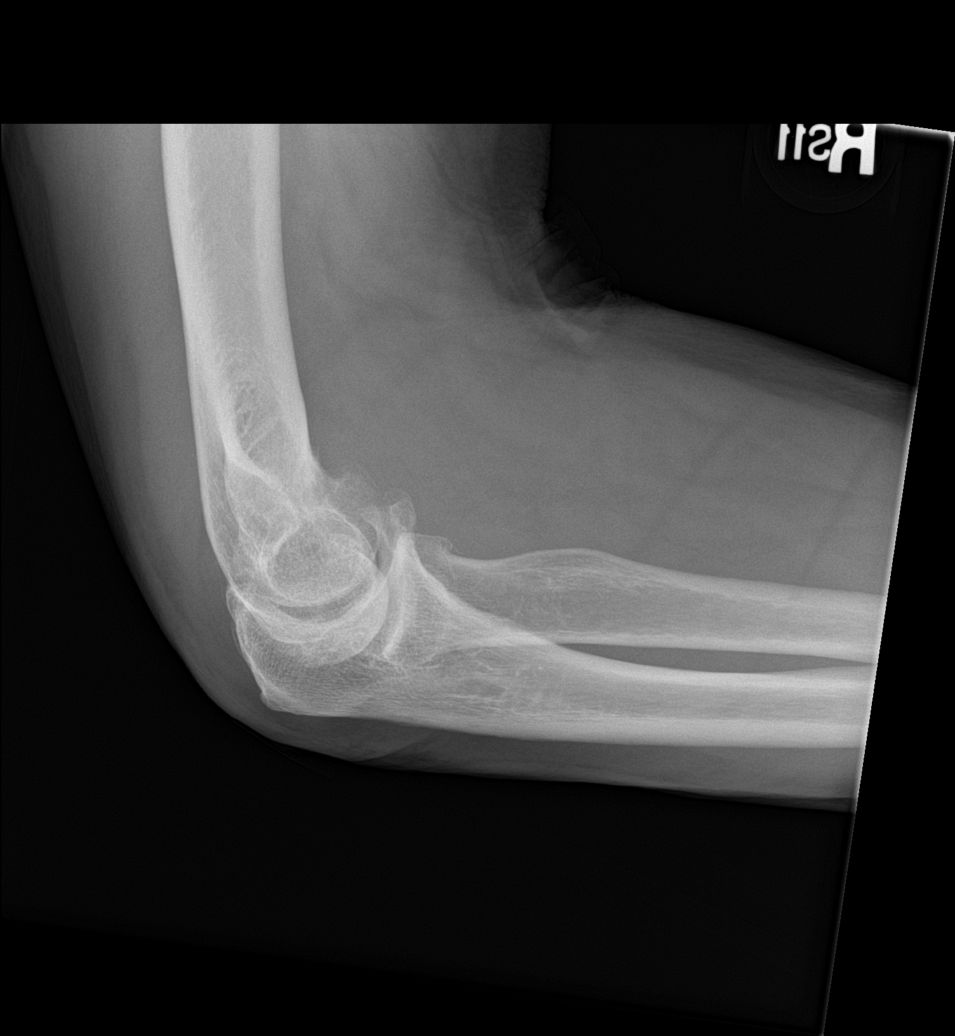

[elbow obl (1 of 2)]
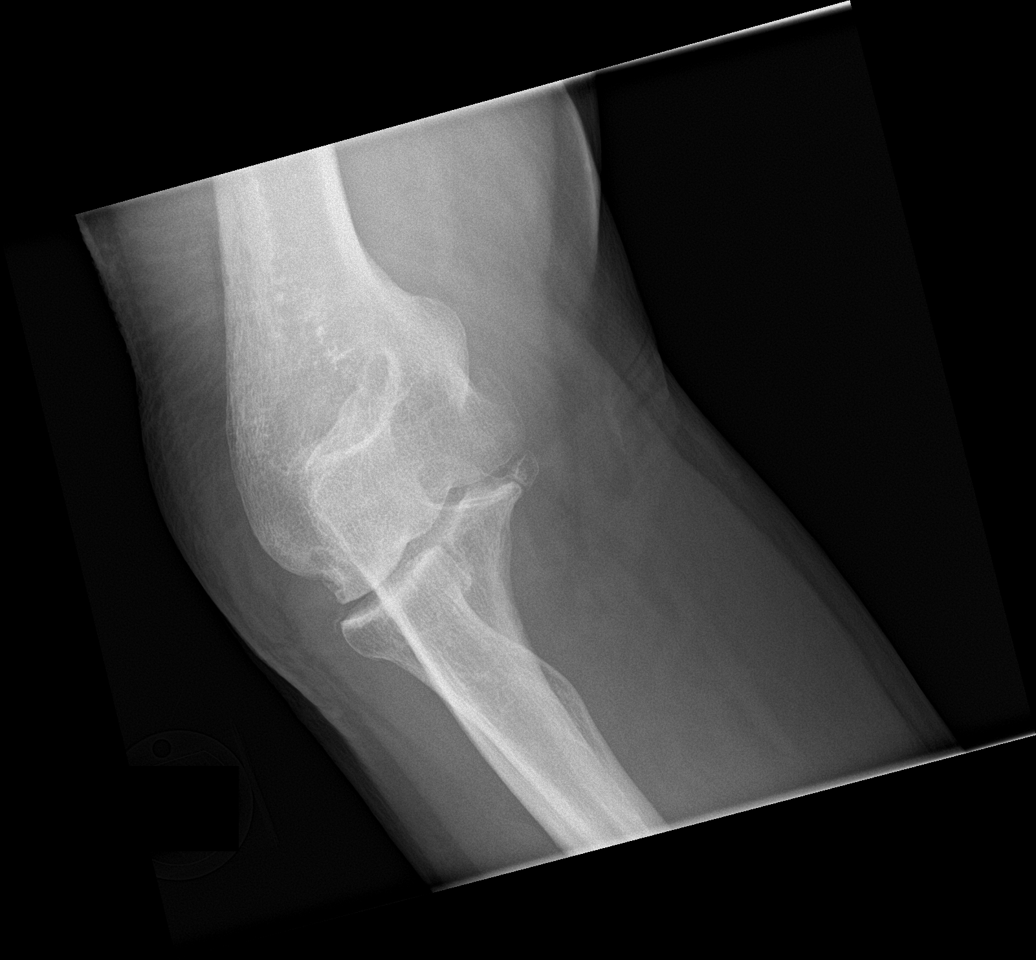

[elbow obl (2 of 2)]
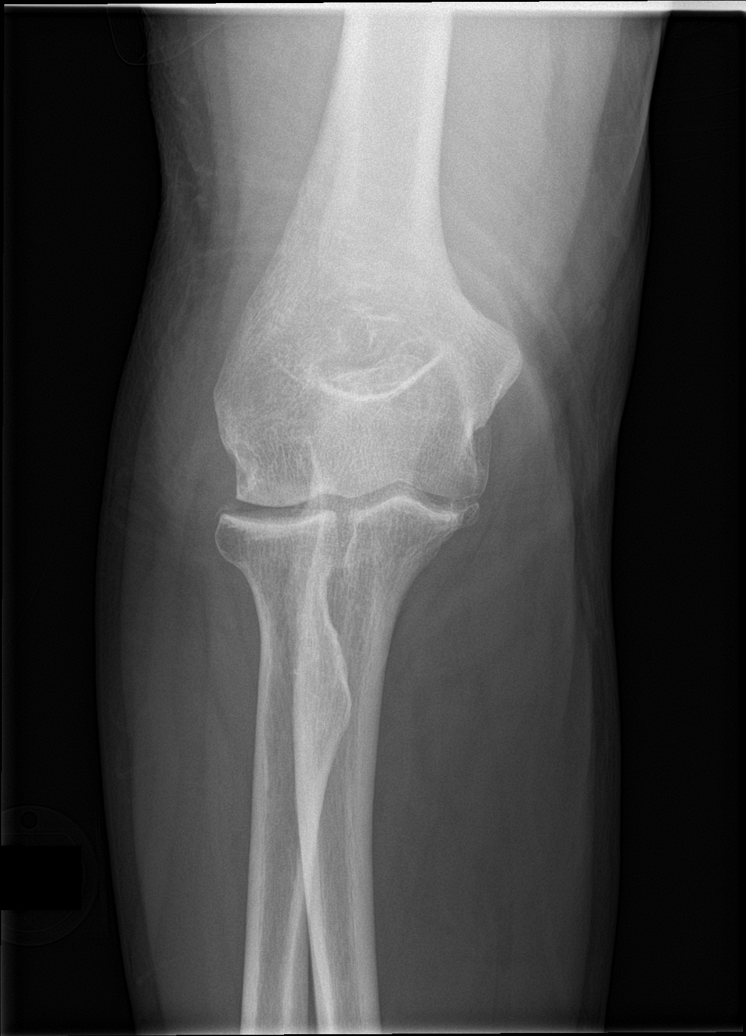

[elbow lat]
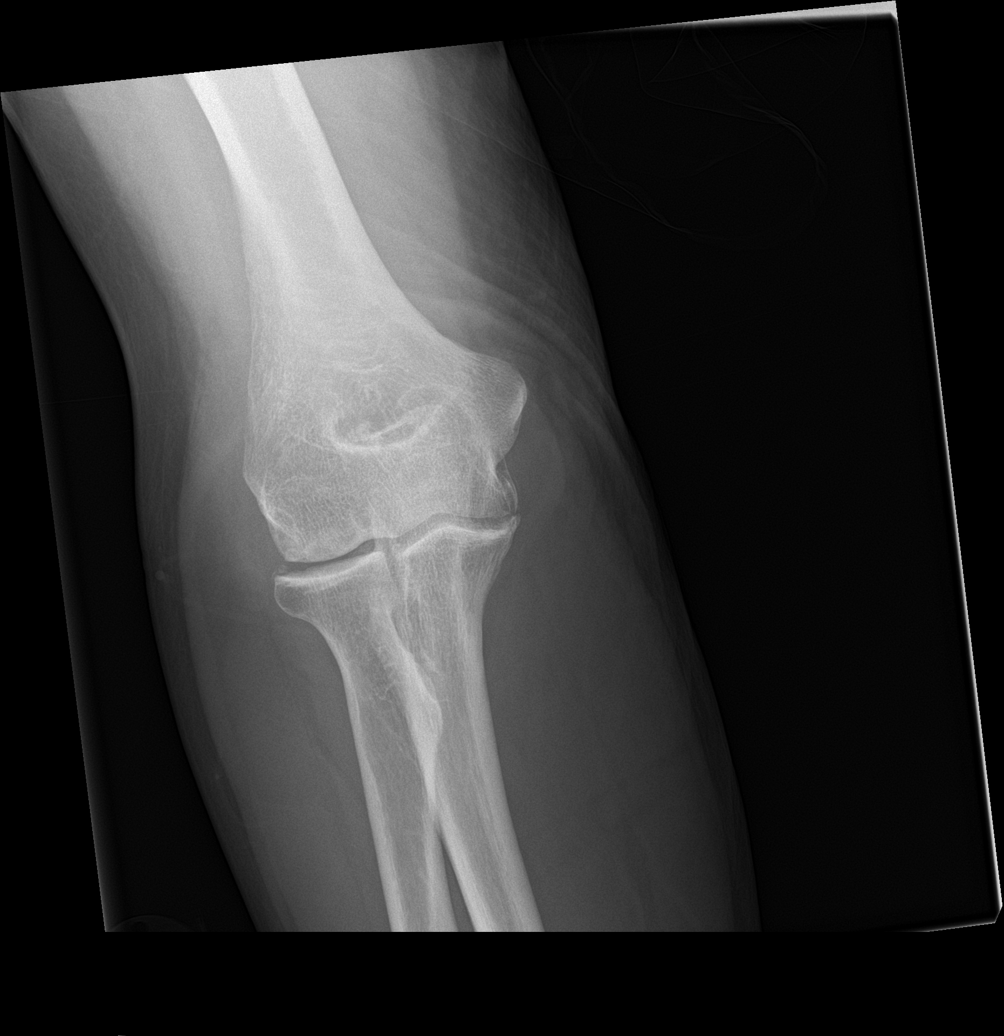

[4 of 4 positions shown; findings below may reference images not displayed]

FINDINGS: Normal right elbow alignment with minor degenerative changes. no
subluxation, dislocation, acute osseous finding or fracture. Mild
soft tissue swelling suspected posteriorly. No large joint effusion
appreciated.
IMPRESSION: Degenerative changes and posterior right elbow mild soft tissue
swelling. No acute osseous finding.

## 2016-01-08 ENCOUNTER — Encounter: Payer: Medicare Other | Admitting: Family Medicine

## 2016-01-08 ENCOUNTER — Telehealth: Payer: Self-pay | Admitting: Family Medicine

## 2016-01-08 NOTE — Telephone Encounter (Signed)
Pt was on schedule to see Dr. Sarajane Jews for physical, I called and left a voice message for pt to reschedule.

## 2016-02-05 DIAGNOSIS — H903 Sensorineural hearing loss, bilateral: Secondary | ICD-10-CM | POA: Diagnosis not present

## 2016-02-05 DIAGNOSIS — R1313 Dysphagia, pharyngeal phase: Secondary | ICD-10-CM | POA: Diagnosis not present

## 2016-02-05 DIAGNOSIS — K219 Gastro-esophageal reflux disease without esophagitis: Secondary | ICD-10-CM | POA: Diagnosis not present

## 2016-02-07 ENCOUNTER — Telehealth: Payer: Self-pay | Admitting: Family Medicine

## 2016-02-07 ENCOUNTER — Encounter: Payer: Medicare Other | Admitting: Family Medicine

## 2016-02-07 DIAGNOSIS — Z0289 Encounter for other administrative examinations: Secondary | ICD-10-CM

## 2016-02-07 NOTE — Telephone Encounter (Signed)
Pt was on schedule for a physical today with Dr. Sarajane Jews I called and spoke with pt, he forgot about appointment and will reschedule this.

## 2016-02-26 ENCOUNTER — Other Ambulatory Visit: Payer: Self-pay | Admitting: Family Medicine

## 2016-03-07 ENCOUNTER — Encounter: Payer: Self-pay | Admitting: Family Medicine

## 2016-03-07 ENCOUNTER — Ambulatory Visit (INDEPENDENT_AMBULATORY_CARE_PROVIDER_SITE_OTHER): Payer: Medicare Other | Admitting: Family Medicine

## 2016-03-07 VITALS — BP 140/90 | HR 81 | Temp 98.0°F | Ht 70.5 in | Wt 250.0 lb

## 2016-03-07 DIAGNOSIS — F329 Major depressive disorder, single episode, unspecified: Secondary | ICD-10-CM | POA: Diagnosis not present

## 2016-03-07 DIAGNOSIS — Z8546 Personal history of malignant neoplasm of prostate: Secondary | ICD-10-CM | POA: Diagnosis not present

## 2016-03-07 DIAGNOSIS — I10 Essential (primary) hypertension: Secondary | ICD-10-CM

## 2016-03-07 DIAGNOSIS — M109 Gout, unspecified: Secondary | ICD-10-CM

## 2016-03-07 DIAGNOSIS — D509 Iron deficiency anemia, unspecified: Secondary | ICD-10-CM | POA: Diagnosis not present

## 2016-03-07 DIAGNOSIS — F32A Depression, unspecified: Secondary | ICD-10-CM

## 2016-03-07 DIAGNOSIS — E785 Hyperlipidemia, unspecified: Secondary | ICD-10-CM

## 2016-03-07 LAB — CBC WITH DIFFERENTIAL/PLATELET
BASOS ABS: 0 10*3/uL (ref 0.0–0.1)
BASOS PCT: 0.3 % (ref 0.0–3.0)
EOS ABS: 0.2 10*3/uL (ref 0.0–0.7)
Eosinophils Relative: 2.9 % (ref 0.0–5.0)
HCT: 41.2 % (ref 39.0–52.0)
Hemoglobin: 13.7 g/dL (ref 13.0–17.0)
LYMPHS ABS: 1.7 10*3/uL (ref 0.7–4.0)
Lymphocytes Relative: 27.3 % (ref 12.0–46.0)
MCHC: 33.2 g/dL (ref 30.0–36.0)
MCV: 90.2 fl (ref 78.0–100.0)
Monocytes Absolute: 0.7 10*3/uL (ref 0.1–1.0)
Monocytes Relative: 10.9 % (ref 3.0–12.0)
NEUTROS ABS: 3.7 10*3/uL (ref 1.4–7.7)
NEUTROS PCT: 58.6 % (ref 43.0–77.0)
PLATELETS: 186 10*3/uL (ref 150.0–400.0)
RBC: 4.57 Mil/uL (ref 4.22–5.81)
RDW: 16.7 % — AB (ref 11.5–15.5)
WBC: 6.3 10*3/uL (ref 4.0–10.5)

## 2016-03-07 LAB — POC URINALSYSI DIPSTICK (AUTOMATED)
Glucose, UA: NEGATIVE
KETONES UA: NEGATIVE
Leukocytes, UA: NEGATIVE
Nitrite, UA: NEGATIVE
PH UA: 5.5
RBC UA: NEGATIVE
Spec Grav, UA: >=1.03
Urobilinogen, UA: 1

## 2016-03-07 LAB — BASIC METABOLIC PANEL
BUN: 24 mg/dL — AB (ref 6–23)
CHLORIDE: 104 meq/L (ref 96–112)
CO2: 30 meq/L (ref 19–32)
CREATININE: 1.22 mg/dL (ref 0.40–1.50)
Calcium: 10 mg/dL (ref 8.4–10.5)
GFR: 62.04 mL/min (ref >60.00)
GLUCOSE: 106 mg/dL — AB (ref 70–99)
Potassium: 5.2 mEq/L — ABNORMAL HIGH (ref 3.5–5.1)
Sodium: 142 mEq/L (ref 135–145)

## 2016-03-07 LAB — LIPID PANEL
CHOL/HDL RATIO: 2
Cholesterol: 148 mg/dL (ref 0–200)
HDL: 63.8 mg/dL (ref >39.00)
LDL Cholesterol: 74 mg/dL (ref 0–99)
NONHDL: 84.21
TRIGLYCERIDES: 53 mg/dL (ref 0.0–149.0)
VLDL: 10.6 mg/dL (ref 0.0–40.0)

## 2016-03-07 LAB — HEPATIC FUNCTION PANEL
ALBUMIN: 4.5 g/dL (ref 3.5–5.2)
ALT: 17 U/L (ref 0–53)
AST: 19 U/L (ref 0–37)
Alkaline Phosphatase: 68 U/L (ref 39–117)
Bilirubin, Direct: 0.2 mg/dL (ref 0.0–0.3)
TOTAL PROTEIN: 7 g/dL (ref 6.0–8.3)
Total Bilirubin: 0.7 mg/dL (ref 0.2–1.2)

## 2016-03-07 LAB — TSH: TSH: 0.94 u[IU]/mL (ref 0.35–4.50)

## 2016-03-07 LAB — PSA: PSA: 0.01 ng/mL — AB (ref 0.10–4.00)

## 2016-03-07 MED ORDER — PAROXETINE HCL 20 MG PO TABS: 20.0000 mg | ORAL_TABLET | ORAL | Status: DC

## 2016-03-07 MED ORDER — OXYCODONE HCL 10 MG PO TABS: 10.0000 mg | ORAL_TABLET | Freq: Four times a day (QID) | ORAL | Status: DC

## 2016-03-07 MED ORDER — PANTOPRAZOLE SODIUM 40 MG PO TBEC: 40.0000 mg | DELAYED_RELEASE_TABLET | Freq: Every day | ORAL | Status: DC

## 2016-03-07 MED ORDER — INDOMETHACIN 50 MG PO CAPS: 50.0000 mg | ORAL_CAPSULE | Freq: Three times a day (TID) | ORAL | Status: DC

## 2016-03-07 MED ORDER — GABAPENTIN 100 MG PO CAPS: 100.0000 mg | ORAL_CAPSULE | Freq: Two times a day (BID) | ORAL | Status: DC

## 2016-03-07 MED ORDER — CYCLOBENZAPRINE HCL 10 MG PO TABS: 10.0000 mg | ORAL_TABLET | Freq: Three times a day (TID) | ORAL | Status: DC | PRN

## 2016-03-07 MED ORDER — ALLOPURINOL 300 MG PO TABS: 300.0000 mg | ORAL_TABLET | Freq: Every day | ORAL | Status: DC

## 2016-03-07 NOTE — Progress Notes (Signed)
   Subjective:    Patient ID: Ricky Meyers, male    DOB: 1946-09-22, 70 y.o.   MRN: [id number]  HPI 70 yr old male to follow up on multiple issues. In general he is doing well, although he deals with back and joint pains daily. He takes Indomethacin twice daily and adds Oxycodone as needed. He remains active and is excited about planting his vegetable garden again this spring. He sees Dr. Amil Amen for gout, but this is under better control since his Gabapentin was increased to 300 mg a day. His BP is stable. His depression is stable.    Review of Systems  Constitutional: Negative.   HENT: Negative.   Eyes: Negative.   Respiratory: Negative.   Cardiovascular: Negative.   Gastrointestinal: Negative.   Genitourinary: Negative.   Musculoskeletal: Positive for back pain, arthralgias and neck pain. Negative for joint swelling and gait problem.  Skin: Negative.   Neurological: Negative.   Psychiatric/Behavioral: Negative.        Objective:   Physical Exam  Constitutional: He is oriented to person, place, and time. He appears well-developed and well-nourished. No distress.  HENT:  Head: Normocephalic and atraumatic.  Right Ear: External ear normal.  Left Ear: External ear normal.  Nose: Nose normal.  Mouth/Throat: Oropharynx is clear and moist. No oropharyngeal exudate.  Eyes: Conjunctivae and EOM are normal. Pupils are equal, round, and reactive to light. Right eye exhibits no discharge. Left eye exhibits no discharge. No scleral icterus.  Neck: Neck supple. No JVD present. No tracheal deviation present. No thyromegaly present.  Cardiovascular: Normal rate, regular rhythm, normal heart sounds and intact distal pulses.  Exam reveals no gallop and no friction rub.   No murmur heard. EKG shows stable LBBB  Pulmonary/Chest: Effort normal and breath sounds normal. No respiratory distress. He has no wheezes. He has no rales. He exhibits no tenderness.  Abdominal: Soft. Bowel sounds are  normal. He exhibits no distension and no mass. There is no tenderness. There is no rebound and no guarding.  Genitourinary: Rectum normal, prostate normal and penis normal. Guaiac negative stool. No penile tenderness.  Musculoskeletal: Normal range of motion. He exhibits no edema or tenderness.  Lymphadenopathy:    He has no cervical adenopathy.  Neurological: He is alert and oriented to person, place, and time. He has normal reflexes. No cranial nerve deficit. He exhibits normal muscle tone. Coordination normal.  Skin: Skin is warm and dry. No rash noted. He is not diaphoretic. No erythema. No pallor.  Psychiatric: He has a normal mood and affect. His behavior is normal. Judgment and thought content normal.          Assessment & Plan:  His HTN is stable. His depression is well controlled. His osteoarthritis and gout are controlled. His GERD is controlled. Get fasting labs today.

## 2016-03-07 NOTE — Progress Notes (Signed)
Pre visit review using our clinic review tool, if applicable. No additional management support is needed unless otherwise documented below in the visit note. 

## 2016-03-19 ENCOUNTER — Other Ambulatory Visit: Payer: Self-pay | Admitting: Family Medicine

## 2016-03-19 NOTE — Telephone Encounter (Signed)
Did not see on pt's current medication list?

## 2016-05-01 ENCOUNTER — Encounter: Payer: Self-pay | Admitting: Family Medicine

## 2016-05-01 ENCOUNTER — Other Ambulatory Visit: Payer: Self-pay | Admitting: Family Medicine

## 2016-05-15 ENCOUNTER — Other Ambulatory Visit: Payer: Self-pay | Admitting: Family Medicine

## 2016-06-26 ENCOUNTER — Encounter: Payer: Self-pay | Admitting: Family Medicine

## 2016-06-26 ENCOUNTER — Other Ambulatory Visit: Payer: Self-pay | Admitting: Family Medicine

## 2016-06-26 NOTE — Telephone Encounter (Signed)
done

## 2016-06-27 ENCOUNTER — Encounter: Payer: Self-pay | Admitting: Family Medicine

## 2016-06-28 NOTE — Telephone Encounter (Signed)
Dr Sarajane Jews states this is done, but not ready. [place] up today. Is that ok?  [phone]

## 2016-07-01 ENCOUNTER — Telehealth: Payer: Self-pay | Admitting: Family Medicine

## 2016-07-01 MED ORDER — OXYCODONE HCL 10 MG PO TABS: 10.0000 mg | ORAL_TABLET | Freq: Four times a day (QID) | ORAL | 0 refills | Status: DC

## 2016-07-01 NOTE — Telephone Encounter (Signed)
done

## 2016-07-01 NOTE — Telephone Encounter (Signed)
Pt needs new rx oxycodone. [place] going to beach and wife will pick up

## 2016-07-02 NOTE — Telephone Encounter (Signed)
These were written yesterday

## 2016-07-02 NOTE — Telephone Encounter (Signed)
Left message on machine that Rx is ready for pick up 

## 2016-07-11 ENCOUNTER — Encounter: Payer: Self-pay | Admitting: Family Medicine

## 2016-07-11 NOTE — Telephone Encounter (Signed)
I don't know of any "medical massage" places but [facility] on [address] has an excellent reputation

## 2016-08-06 ENCOUNTER — Ambulatory Visit (INDEPENDENT_AMBULATORY_CARE_PROVIDER_SITE_OTHER): Payer: Medicare Other | Admitting: Family Medicine

## 2016-08-06 ENCOUNTER — Encounter: Payer: Self-pay | Admitting: Family Medicine

## 2016-08-06 VITALS — BP 146/94 | Temp 98.3°F | Ht 70.5 in | Wt 254.0 lb

## 2016-08-06 DIAGNOSIS — K219 Gastro-esophageal reflux disease without esophagitis: Secondary | ICD-10-CM | POA: Diagnosis not present

## 2016-08-06 NOTE — Progress Notes (Signed)
   Subjective:    Patient ID: Ricky Meyers, male    DOB: [birth year], 70 y.o.   MRN: [id number]  HPI Here for chronic sensations of needing to clear his throat and of coughing up clear sputum. No PND or other allergy symptoms. His throat will get sore at times and his voice gets hoarse. He saw Dr. Redmond Baseman last year and had larongoscopy. He was diagnosed with laryngeal reflux and told to see a GI doctor. He takes Protonix every night.   Review of Systems  Constitutional: Negative.   HENT: Positive for sore throat, trouble swallowing and voice change. Negative for congestion, postnasal drip, sinus pressure and sneezing.   Eyes: Negative.   Respiratory: Negative.   Cardiovascular: Negative.   Gastrointestinal: Negative.        Objective:   Physical Exam  Constitutional: He appears well-developed and well-nourished.  HENT:  Right Ear: External ear normal.  Left Ear: External ear normal.  Nose: Nose normal.  His voice is hoarse and the posterior OP is slightly red   Eyes: Conjunctivae are normal.  Neck: No thyromegaly present.  Pulmonary/Chest: Effort normal and breath sounds normal. No respiratory distress. He has no wheezes. He has no rales.  Lymphadenopathy:    He has no cervical adenopathy.          Assessment & Plan:  He seems to have GERD which is not responsive to a PPI. Refer to GI to evaluate further.  [name], MD

## 2016-08-06 NOTE — Progress Notes (Signed)
Pre visit review using our clinic review tool, if applicable. No additional management support is needed unless otherwise documented below in the visit note. 

## 2016-08-07 ENCOUNTER — Encounter: Payer: Self-pay | Admitting: Nurse Practitioner

## 2016-08-16 ENCOUNTER — Ambulatory Visit (INDEPENDENT_AMBULATORY_CARE_PROVIDER_SITE_OTHER): Payer: Medicare Other | Admitting: Nurse Practitioner

## 2016-08-16 ENCOUNTER — Encounter: Payer: Self-pay | Admitting: Nurse Practitioner

## 2016-08-16 VITALS — BP 158/98 | HR 80 | Ht 72.0 in | Wt 259.5 lb

## 2016-08-16 DIAGNOSIS — R1319 Other dysphagia: Secondary | ICD-10-CM | POA: Diagnosis not present

## 2016-08-16 DIAGNOSIS — F458 Other somatoform disorders: Secondary | ICD-10-CM | POA: Diagnosis not present

## 2016-08-16 DIAGNOSIS — R0989 Other specified symptoms and signs involving the circulatory and respiratory systems: Secondary | ICD-10-CM

## 2016-08-16 NOTE — Progress Notes (Signed)
HPI:  Patient is 70 year old male known to Dr. Ardis Hughs. He had screening colonoscopy 2008 with diverticular disease in left colon, otherwise normal. Repeat exam recommended in 2018. Patient referred now by PCP, Dr. Alysia Penna, for evaluation of GERD.  Patient has chronic throat clearing, worse in the mornings.  He occasionally coughs up some grayish sputum. Does not feel symptoms are related to postnasal drip . A couple of years ago patient had a piece of steak stuck in his esophagus. He was evaluated in the emergency department, food bolus passed without intervention. Last year at home patient had another episode of meat getting stuck in his esophagus. He was again evaluated in the emergency department, given what sounds like IV glucagon with subsequent passage of meat. Patient was advised to follow-up with a gastroenterologist but he did not do so. He's been chewing his food much better, eating slowly.  Patient denies heartburn. He has been on a PPI since swallowing problems a couple of years ago. He takes the PPI daily but not necessarily before breakfast  Past Medical History:  Diagnosis Date  . Arthritis    neck and back   . Chronic neck pain   . Depression   . ED (erectile dysfunction)   . Gout    sees Dr. Leigh Aurora   . Hyperlipidemia   . Hypertension   . Prostate cancer [facility])    history of     Past Surgical History:  Procedure Laterality Date  . CERVICAL FUSION  12/02/07   per Dr. Sherley Bounds  . COLONOSCOPY  12/22/06   per Dr. Ardis Hughs, repeat in 10 yrs  . PROSTATECTOMY    . TONSILLECTOMY    . VASECTOMY     Family History  Problem Relation Age of Onset  . Cancer Other     breast, porstate  . Hypertension Other   . Stroke Other    Social History  Substance Use Topics  . Smoking status: Never Smoker  . Smokeless tobacco: Never Used  . Alcohol use 0.0 oz/week     Comment: occ   Current Outpatient Prescriptions  Medication Sig Dispense Refill  .  allopurinol (ZYLOPRIM) 300 MG tablet Take 1 tablet (300 mg total) by mouth daily. 30 tablet 0  . atorvastatin (LIPITOR) 40 MG tablet TAKE ONE TABLET BY MOUTH ONCE DAILY 90 tablet 3  . cyclobenzaprine (FLEXERIL) 10 MG tablet Take 1 tablet (10 mg total) by mouth 3 (three) times daily as needed. for muscle spams 90 tablet 5  . gabapentin (NEURONTIN) 100 MG capsule Take 1 capsule (100 mg total) by mouth 2 (two) times daily. 180 capsule 3  . indomethacin (INDOCIN) 50 MG capsule Take 1 capsule (50 mg total) by mouth 3 (three) times daily with meals. 90 capsule 5  . lisinopril-hydrochlorothiazide (PRINZIDE,ZESTORETIC) 20-25 MG tablet TAKE ONE TABLET BY MOUTH ONCE DAILY 90 tablet 0  . Oxycodone HCl 10 MG TABS Take 1 tablet (10 mg total) by mouth every 6 (six) hours. 120 tablet 0  . pantoprazole (PROTONIX) 40 MG tablet Take 1 tablet (40 mg total) by mouth at bedtime. 90 tablet 3  . PARoxetine (PAXIL) 20 MG tablet Take 1 tablet (20 mg total) by mouth every morning. 90 tablet 3  . psyllium (METAMUCIL) 58.6 % packet Take 1 packet by mouth daily as needed (for constipation).      No current facility-administered medications for this visit.    No Known Allergies   Review of Systems: Positive  for back pain, cough, hearing problems and was changes. All systems reviewed and negative except where noted in HPI.   Physical Exam: BP (!) 158/98   Pulse 80   Ht 6' (1.829 m)   Wt 259 lb 8 oz (117.7 kg)   BMI 35.19 kg/m  Constitutional: Pleasant, white male in no acute distress. HEENT: Normocephalic and atraumatic. Conjunctivae are normal. No scleral icterus. Neck supple.  Cardiovascular: Normal rate, regular rhythm.  Pulmonary/chest: Effort normal and breath sounds normal. No wheezing, rales or rhonchi. Abdominal: Soft, nondistended, nontender. Bowel sounds active throughout. There are no masses palpable. No hepatomegaly. Extremities: no edema Lymphadenopathy: No cervical adenopathy noted. Neurological:  Alert and oriented to person place and time. Skin: Skin is warm and dry. No rashes noted. Psychiatric: Normal mood and affect. Behavior is normal.   ASSESSMENT AND PLAN: 57. 70 year old male with raspy voice, throat clearing, occasional cough productive of gray sputum. Postnasal drip? GERD?  On chronic PPI . -Continue daily PPI, change timing to 30 minutes before breakfast. I have asked patient to try daily Claritin for 2 days to see if it helps with some of the throat clearing  2. Intermittent dysphagia. Episode of meat impaction 2 years ago, passed spontaneously in the emergency department. Second episode of meat impaction one year ago, food bolus passed with what sounds like glucagon. Patient was advised to see a gastroenterologist but did not do so until now,  -We will schedule patient for an upper endoscopy with possible dilation. He understands the risk and benefits of the procedure and agrees to proceed. In the interim small bites of well chewed food. He slowly. Plenty of liquids in between bites  3. HTN, DBP elevated today at 98.    [name], MD

## 2016-08-16 NOTE — Patient Instructions (Signed)
You have been scheduled for an endoscopy. Please follow written instructions given to you at your visit today. If you use inhalers (even only as needed), please bring them with you on the day of your procedure. Your physician has requested that you go to www.startemmi.com and enter the access code given to you at your visit today. This web site gives a general overview about your procedure. However, you should still follow specific instructions given to you by our office regarding your preparation for the procedure.  Take Claritin once tablet daily for 2 weeks.

## 2016-08-19 NOTE — Progress Notes (Signed)
I agree with the above note, plan 

## 2016-08-23 ENCOUNTER — Encounter: Payer: Self-pay | Admitting: Family Medicine

## 2016-08-23 ENCOUNTER — Other Ambulatory Visit: Payer: Self-pay | Admitting: Family Medicine

## 2016-09-10 DIAGNOSIS — M47817 Spondylosis without myelopathy or radiculopathy, lumbosacral region: Secondary | ICD-10-CM | POA: Diagnosis not present

## 2016-09-10 DIAGNOSIS — M47816 Spondylosis without myelopathy or radiculopathy, lumbar region: Secondary | ICD-10-CM | POA: Diagnosis not present

## 2016-09-12 ENCOUNTER — Other Ambulatory Visit: Payer: Self-pay | Admitting: Family Medicine

## 2016-09-12 DIAGNOSIS — M47817 Spondylosis without myelopathy or radiculopathy, lumbosacral region: Secondary | ICD-10-CM | POA: Diagnosis not present

## 2016-09-13 MED ORDER — ALLOPURINOL 300 MG PO TABS: 300.0000 mg | ORAL_TABLET | Freq: Every day | ORAL | 6 refills | Status: DC

## 2016-09-17 ENCOUNTER — Encounter (HOSPITAL_COMMUNITY): Payer: Self-pay

## 2016-09-17 ENCOUNTER — Emergency Department (HOSPITAL_COMMUNITY)
Admission: EM | Admit: 2016-09-17 | Discharge: 2016-09-17 | Disposition: A | Discharge status: Home / Self Care | Payer: Medicare Other | Attending: Emergency Medicine | Admitting: Emergency Medicine

## 2016-09-17 DIAGNOSIS — I10 Essential (primary) hypertension: Secondary | ICD-10-CM | POA: Insufficient documentation

## 2016-09-17 DIAGNOSIS — R51 Headache: Secondary | ICD-10-CM | POA: Diagnosis present

## 2016-09-17 DIAGNOSIS — M62838 Other muscle spasm: Secondary | ICD-10-CM | POA: Insufficient documentation

## 2016-09-17 DIAGNOSIS — Z8546 Personal history of malignant neoplasm of prostate: Secondary | ICD-10-CM | POA: Insufficient documentation

## 2016-09-17 DIAGNOSIS — G44209 Tension-type headache, unspecified, not intractable: Secondary | ICD-10-CM | POA: Diagnosis not present

## 2016-09-17 HISTORY — PX: OTHER SURGICAL HISTORY: SHX169

## 2016-09-17 MED ORDER — OXYCODONE HCL 10 MG PO TABS: 10.0000 mg | ORAL_TABLET | Freq: Once | ORAL | Status: DC

## 2016-09-17 MED ORDER — OXYCODONE HCL 5 MG PO TABS
10.0000 mg | ORAL_TABLET | Freq: Once | ORAL | Status: AC
  Administered 2016-09-17: 10 mg via ORAL
  Filled 2016-09-17: qty 2

## 2016-09-17 MED ORDER — DIAZEPAM 5 MG/ML IJ SOLN
5.0000 mg | Freq: Once | INTRAMUSCULAR | Status: AC
  Administered 2016-09-17: 5 mg via INTRAMUSCULAR
  Filled 2016-09-17: qty 2

## 2016-09-17 MED ORDER — DIAZEPAM 5 MG PO TABS: 5.0000 mg | ORAL_TABLET | Freq: Three times a day (TID) | ORAL | 0 refills | Status: DC | PRN

## 2016-09-17 MED ORDER — HYDROMORPHONE HCL 2 MG/ML IJ SOLN
1.0000 mg | Freq: Once | INTRAMUSCULAR | Status: AC
  Administered 2016-09-17: 1 mg via INTRAMUSCULAR
  Filled 2016-09-17: qty 1

## 2016-09-17 NOTE — ED Notes (Signed)
ED Provider at bedside. 

## 2016-09-17 NOTE — ED Triage Notes (Signed)
Patient complains of neck and head pain x 2 days. Denies injury but complains of increased pain with any movement of head/neck. No blurred vision.  Alert and oriented, no relief with muscle relaxers or NSAIDS

## 2016-09-17 NOTE — ED Provider Notes (Signed)
[facility] DEPT Provider Note   CSN: [id number] Arrival date & time: 09/17/16  0800     History   Chief Complaint Chief Complaint  Patient presents with  . Headache  . Neck Pain    HPI Ricky Meyers is a 70 y.o. male.  Patient presents to the emergency department with chief complaint of headache and neck pain. He states that his symptoms started yesterday. He states that he spent all day sitting in a deer stand. He states that his symptoms worsened overnight. He reports tight muscles in his upper neck and back. He has tried taking his normal pain medicine (10 mg oxycodone) with no relief. He denies any numbness, weakness, or tingling. Denies any vision changes. Denies any fevers, chills, cough. His symptoms are worsened with movement of his head and palpation of his neck muscles.   The history is provided by the patient. No language interpreter was used.    Past Medical History:  Diagnosis Date  . Arthritis    neck and back   . Chronic neck pain   . Depression   . ED (erectile dysfunction)   . Gout    sees Dr. Leigh Aurora   . Hyperlipidemia   . Hypertension   . Prostate cancer [facility])    history of    Patient Active Problem List   Diagnosis Date Noted  . Anemia, iron deficiency 12/03/2014  . Fever 12/02/2014  . Pseudogout of elbow 12/02/2014  . Arthritis 12/02/2014  . Hyponatremia 12/02/2014  . Constipation 12/02/2014  . Suspected Septic arthritis 12/02/2014  . Nontraumatic pain and swelling of elbow   . ACUTE SINUSITIS, UNSPECIFIED 10/01/2010  . GROIN PAIN 10/01/2010  . ACUTE BRONCHITIS 08/30/2008  . Depression 03/04/2008  . SCARLET FEVER 06/30/2007  . Hyperlipemia 06/30/2007  . Gout 06/30/2007  . Essential hypertension 06/30/2007  . NECK PAIN 06/30/2007  . PROSTATE CANCER, HX OF 06/30/2007    Past Surgical History:  Procedure Laterality Date  . CERVICAL FUSION  12/02/07   per Dr. Sherley Bounds  . COLONOSCOPY  12/22/06   per Dr. Ardis Hughs, repeat in  10 yrs  . PROSTATECTOMY    . TONSILLECTOMY    . VASECTOMY         Home Medications    Prior to Admission medications   Medication Sig Start Date End Date Taking? Authorizing Provider  allopurinol (ZYLOPRIM) 300 MG tablet Take 1 tablet (300 mg total) by mouth daily. 09/13/16  Yes [name], MD  atorvastatin (LIPITOR) 40 MG tablet TAKE ONE TABLET BY MOUTH ONCE DAILY 03/19/16  Yes [name], MD  cyclobenzaprine (FLEXERIL) 10 MG tablet Take 1 tablet (10 mg total) by mouth 3 (three) times daily as needed. for muscle spams 03/07/16  Yes [name], MD  gabapentin (NEURONTIN) 100 MG capsule Take 1 capsule (100 mg total) by mouth 2 (two) times daily. 03/07/16  Yes [name], MD  indomethacin (INDOCIN) 50 MG capsule Take 1 capsule (50 mg total) by mouth 3 (three) times daily with meals. 03/07/16  Yes [name], MD  lisinopril-hydrochlorothiazide (PRINZIDE,ZESTORETIC) 20-25 MG tablet TAKE ONE TABLET BY MOUTH ONCE DAILY 08/23/16  Yes [name], MD  Oxycodone HCl 10 MG TABS Take 1 tablet (10 mg total) by mouth every 6 (six) hours. 07/01/16  Yes [name], MD  pantoprazole (PROTONIX) 40 MG tablet Take 1 tablet (40 mg total) by mouth at bedtime. 03/07/16  Yes [name], MD  PARoxetine (  PAXIL) 20 MG tablet Take 1 tablet (20 mg total) by mouth every morning. 03/07/16  Yes [name], MD  psyllium (METAMUCIL) 58.6 % packet Take 1 packet by mouth daily as needed (for constipation).    Yes Historical Provider, MD    Family History Family History  Problem Relation Age of Onset  . Cancer Other     breast, porstate  . Hypertension Other   . Stroke Other     Social History Social History  Substance Use Topics  . Smoking status: Never Smoker  . Smokeless tobacco: Never Used  . Alcohol use 0.0 oz/week     Comment: occ     Allergies   Patient has no known allergies.   Review of Systems Review of Systems  Musculoskeletal: Positive for myalgias.  All other systems  reviewed and are negative.    Physical Exam Updated Vital Signs BP 140/93   Pulse 83   Temp 97.9 F (36.6 C) (Oral)   Resp 16   Ht 6' (1.829 m)   Wt 93.4 kg   SpO2 94%   BMI 27.94 kg/m   Physical Exam  Constitutional: He is oriented to person, place, and time. He appears well-developed and well-nourished.  HENT:  Head: Normocephalic and atraumatic.  Right Ear: External ear normal.  Left Ear: External ear normal.  Eyes: Conjunctivae and EOM are normal. Pupils are equal, round, and reactive to light.  Neck: Normal range of motion. Neck supple.  No pain with neck flexion, no meningismus  Cardiovascular: Normal rate, regular rhythm and normal heart sounds.  Exam reveals no gallop and no friction rub.   No murmur heard. Pulmonary/Chest: Effort normal and breath sounds normal. No respiratory distress. He has no wheezes. He has no rales. He exhibits no tenderness.  Abdominal: Soft. He exhibits no distension and no mass. There is no tenderness. There is no rebound and no guarding.  Musculoskeletal: Normal range of motion. He exhibits no edema or tenderness.  Bilateral upper trapezius tender and tight  Neurological: He is alert and oriented to person, place, and time. He has normal reflexes.  CN 3-12 intact, normal finger to nose, no pronator drift, sensation and strength intact bilaterally.  Skin: Skin is warm and dry.  Psychiatric: He has a normal mood and affect. His behavior is normal. Judgment and thought content normal.  Nursing note and vitals reviewed.    ED Treatments / Results  Labs (all labs ordered are listed, but only abnormal results are displayed) Labs Reviewed - No data to display  EKG  EKG Interpretation None       Radiology No results found.  Procedures Procedures (including critical care time)  Medications Ordered in ED Medications  diazepam (VALIUM) injection 5 mg (5 mg Intramuscular Given 09/17/16 0920)  HYDROmorphone (DILAUDID) injection 1 mg  (1 mg Intramuscular Given 09/17/16 0921)     Initial Impression / Assessment and Plan / ED Course  I have reviewed the triage vital signs and the nursing notes.  Pertinent labs & imaging results that were available during my care of the patient were reviewed by me and considered in my medical decision making (see chart for details).  Clinical Course     Patient with neck pain and headache.  No fever.  VSS.  Seems consistent with tension headache.  Was sitting in a deer stand all day yesterday.  Will treat pain with dilaudid and muscle tightness with valium and heat.  Will reassess.  11:32 AM Patient  reassessed. He states that he is feeling improved, but does still have symptoms. He states that he is ready to be discharged at this time. Recommend continuing conservative therapies at home. Patient understands and agrees the plan. Vital signs are stable. He is well-appearing. He is stable and ready for discharge.  Final Clinical Impressions(s) / ED Diagnoses   Final diagnoses:  Tension headache  Trapezius muscle spasm    New Prescriptions New Prescriptions   DIAZEPAM (VALIUM) 5 MG TABLET    Take 1 tablet (5 mg total) by mouth every 8 (eight) hours as needed for anxiety.     Montine Circle, PA-C 09/17/16 1133    Julianne Rice, MD 09/18/16 1155

## 2016-09-18 ENCOUNTER — Encounter (HOSPITAL_COMMUNITY): Payer: Self-pay | Admitting: Emergency Medicine

## 2016-09-18 ENCOUNTER — Emergency Department (HOSPITAL_COMMUNITY): Payer: Medicare Other

## 2016-09-18 ENCOUNTER — Emergency Department (HOSPITAL_COMMUNITY)
Admission: EM | Admit: 2016-09-18 | Discharge: 2016-09-18 | Disposition: A | Discharge status: Home / Self Care | Payer: Medicare Other | Attending: Emergency Medicine | Admitting: Emergency Medicine

## 2016-09-18 DIAGNOSIS — S161XXA Strain of muscle, fascia and tendon at neck level, initial encounter: Secondary | ICD-10-CM | POA: Diagnosis not present

## 2016-09-18 DIAGNOSIS — M436 Torticollis: Secondary | ICD-10-CM | POA: Diagnosis not present

## 2016-09-18 DIAGNOSIS — I10 Essential (primary) hypertension: Secondary | ICD-10-CM | POA: Insufficient documentation

## 2016-09-18 DIAGNOSIS — M545 Low back pain: Secondary | ICD-10-CM | POA: Diagnosis present

## 2016-09-18 DIAGNOSIS — M48061 Spinal stenosis, lumbar region without neurogenic claudication: Secondary | ICD-10-CM

## 2016-09-18 DIAGNOSIS — M47817 Spondylosis without myelopathy or radiculopathy, lumbosacral region: Secondary | ICD-10-CM | POA: Diagnosis not present

## 2016-09-18 DIAGNOSIS — K5903 Drug induced constipation: Secondary | ICD-10-CM | POA: Insufficient documentation

## 2016-09-18 DIAGNOSIS — R140 Abdominal distension (gaseous): Secondary | ICD-10-CM | POA: Diagnosis not present

## 2016-09-18 DIAGNOSIS — Z8546 Personal history of malignant neoplasm of prostate: Secondary | ICD-10-CM | POA: Insufficient documentation

## 2016-09-18 DIAGNOSIS — Z79899 Other long term (current) drug therapy: Secondary | ICD-10-CM | POA: Insufficient documentation

## 2016-09-18 MED ORDER — POLYETHYLENE GLYCOL 3350 17 G PO PACK: 17.0000 g | PACK | Freq: Every day | ORAL | 0 refills | Status: DC

## 2016-09-18 MED ORDER — OXYCODONE-ACETAMINOPHEN 5-325 MG PO TABS
ORAL_TABLET | ORAL | Status: DC
Start: 2016-09-18 — End: 2016-09-18
  Filled 2016-09-18: qty 1

## 2016-09-18 MED ORDER — OXYCODONE-ACETAMINOPHEN 5-325 MG PO TABS
1.0000 | ORAL_TABLET | Freq: Once | ORAL | Status: AC
  Administered 2016-09-18: 1 via ORAL

## 2016-09-18 MED ORDER — MAGNESIUM CITRATE PO SOLN: 1.0000 | Freq: Once | ORAL | 0 refills | Status: AC

## 2016-09-18 MED ORDER — HYDROMORPHONE HCL 2 MG/ML IJ SOLN
1.0000 mg | Freq: Once | INTRAMUSCULAR | Status: AC
  Administered 2016-09-18: 1 mg via INTRAVENOUS
  Filled 2016-09-18: qty 1

## 2016-09-18 MED ORDER — ONDANSETRON HCL 4 MG/2ML IJ SOLN
4.0000 mg | Freq: Once | INTRAMUSCULAR | Status: AC
  Administered 2016-09-18: 4 mg via INTRAVENOUS
  Filled 2016-09-18: qty 2

## 2016-09-18 NOTE — ED Notes (Addendum)
Last BM three days ago, states constipated.  Pt given narcotic pain medicine in triage. Advised of side effects and instructed to avoid driving for a minimum of four hours.

## 2016-09-18 NOTE — ED Triage Notes (Signed)
Pt reports back pain since Saturday, states hx of same. Head and neck pain since Monday. Seen last night for same and sent with valium, states no relief. States pain is aching but s/s have not changed since seen last.

## 2016-09-18 NOTE — ED Provider Notes (Signed)
[facility] DEPT Provider Note   CSN: [id number] Arrival date & time: 09/18/16  0126  By signing my name below, I, Gwenlyn Fudge, attest that this documentation has been prepared under the direction and in the presence of Orpah Greek, MD. Electronically Signed: Gwenlyn Fudge, ED Scribe. 09/18/16. 3:46 AM.   History   Chief Complaint Chief Complaint  Patient presents with  . Back Pain  . Constipation   The history is provided by the patient. No language interpreter was used.   HPI Comments: Ricky Meyers is a 70 y.o. male who presents to the Emergency Department complaining of gradual onset, constant aching posterior neck pain onset 2 days. Pt reports associate lower back pain. He recently went hunting and was climbing up a ladder into a tree, when his chronic back pain began to flare up again. Back pain is chronic and current pain is slightly worse than baseline. He reports associated abdominal distension, constipation and lower back pain. Pt denies pain radiates to legs, vomiting.  Past Medical History:  Diagnosis Date  . Arthritis    neck and back   . Chronic neck pain   . Depression   . ED (erectile dysfunction)   . Gout    sees Dr. Leigh Aurora   . Hyperlipidemia   . Hypertension   . Prostate cancer [facility])    history of    Patient Active Problem List   Diagnosis Date Noted  . Anemia, iron deficiency 12/03/2014  . Fever 12/02/2014  . Pseudogout of elbow 12/02/2014  . Arthritis 12/02/2014  . Hyponatremia 12/02/2014  . Constipation 12/02/2014  . Suspected Septic arthritis 12/02/2014  . Nontraumatic pain and swelling of elbow   . ACUTE SINUSITIS, UNSPECIFIED 10/01/2010  . GROIN PAIN 10/01/2010  . ACUTE BRONCHITIS 08/30/2008  . Depression 03/04/2008  . SCARLET FEVER 06/30/2007  . Hyperlipemia 06/30/2007  . Gout 06/30/2007  . Essential hypertension 06/30/2007  . NECK PAIN 06/30/2007  . PROSTATE CANCER, HX OF 06/30/2007    Past Surgical History:    Procedure Laterality Date  . CERVICAL FUSION  12/02/07   per Dr. Sherley Bounds  . COLONOSCOPY  12/22/06   per Dr. Ardis Hughs, repeat in 10 yrs  . PROSTATECTOMY    . TONSILLECTOMY    . VASECTOMY         Home Medications    Prior to Admission medications   Medication Sig Start Date End Date Taking? Authorizing Provider  allopurinol (ZYLOPRIM) 300 MG tablet Take 1 tablet (300 mg total) by mouth daily. 09/13/16   [name], MD  atorvastatin (LIPITOR) 40 MG tablet TAKE ONE TABLET BY MOUTH ONCE DAILY 03/19/16   [name], MD  cyclobenzaprine (FLEXERIL) 10 MG tablet Take 1 tablet (10 mg total) by mouth 3 (three) times daily as needed. for muscle spams 03/07/16   [name], MD  diazepam (VALIUM) 5 MG tablet Take 1 tablet (5 mg total) by mouth every 8 (eight) hours as needed for anxiety. 09/17/16   Montine Circle, PA-C  gabapentin (NEURONTIN) 100 MG capsule Take 1 capsule (100 mg total) by mouth 2 (two) times daily. 03/07/16   [name], MD  indomethacin (INDOCIN) 50 MG capsule Take 1 capsule (50 mg total) by mouth 3 (three) times daily with meals. 03/07/16   [name], MD  lisinopril-hydrochlorothiazide (PRINZIDE,ZESTORETIC) 20-25 MG tablet TAKE ONE TABLET BY MOUTH ONCE DAILY 08/23/16   [name], MD  magnesium citrate SOLN Take 296 mLs (1  Bottle total) by mouth once. 09/18/16 09/18/16  Orpah Greek, MD  Oxycodone HCl 10 MG TABS Take 1 tablet (10 mg total) by mouth every 6 (six) hours. 07/01/16   [name], MD  pantoprazole (PROTONIX) 40 MG tablet Take 1 tablet (40 mg total) by mouth at bedtime. 03/07/16   [name], MD  PARoxetine (PAXIL) 20 MG tablet Take 1 tablet (20 mg total) by mouth every morning. 03/07/16   [name], MD  polyethylene glycol [facility] Floria Raveling) packet Take 17 g by mouth daily. 09/18/16   Orpah Greek, MD  psyllium (METAMUCIL) 58.6 % packet Take 1 packet by mouth daily as needed (for constipation).     Historical Provider, MD     Family History Family History  Problem Relation Age of Onset  . Cancer Other     breast, porstate  . Hypertension Other   . Stroke Other     Social History Social History  Substance Use Topics  . Smoking status: Never Smoker  . Smokeless tobacco: Never Used  . Alcohol use 0.0 oz/week     Comment: occ     Allergies   Patient has no known allergies.   Review of Systems Review of Systems  Constitutional: Negative for fever.  Gastrointestinal: Positive for abdominal distention and constipation. Negative for vomiting.  Musculoskeletal: Positive for back pain, neck pain and neck stiffness.  All other systems reviewed and are negative.  Physical Exam Updated Vital Signs BP 111/88 (BP Location: Left Arm)   Pulse 72   Temp 99.7 F (37.6 C) (Oral)   Resp 20   Ht 6' (1.829 m)   Wt 260 lb (117.9 kg)   SpO2 95%   BMI 35.26 kg/m   Physical Exam  Constitutional: He is oriented to person, place, and time. He appears well-developed and well-nourished. No distress.  HENT:  Head: Normocephalic and atraumatic.  Right Ear: Hearing normal.  Left Ear: Hearing normal.  Nose: Nose normal.  Mouth/Throat: Oropharynx is clear and moist and mucous membranes are normal.  Eyes: Conjunctivae and EOM are normal. Pupils are equal, round, and reactive to light.  Neck: Normal range of motion. Neck supple.  Cardiovascular: Regular rhythm, S1 normal and S2 normal.  Exam reveals no gallop and no friction rub.   No murmur heard. Pulmonary/Chest: Effort normal and breath sounds normal. No respiratory distress. He exhibits no tenderness.  Abdominal: Soft. Normal appearance and bowel sounds are normal. There is no hepatosplenomegaly. There is no tenderness. There is no rebound, no guarding, no tenderness at McBurney's point and negative Murphy's sign. No hernia.  Musculoskeletal: He exhibits tenderness.  Bilateral paraspinal spasm and decreased ROM and tenderness to the neck  Neurological: He  is alert and oriented to person, place, and time. He has normal strength. No cranial nerve deficit or sensory deficit. Coordination normal. GCS eye subscore is 4. GCS verbal subscore is 5. GCS motor subscore is 6.  Skin: Skin is warm, dry and intact. No rash noted. No cyanosis.  Psychiatric: He has a normal mood and affect. His speech is normal and behavior is normal. Thought content normal.  Nursing note and vitals reviewed.  ED Treatments / Results  DIAGNOSTIC STUDIES: Oxygen Saturation is 95% on RA, adequate by my interpretation.    COORDINATION OF CARE: 3:43 AM Discussed treatment plan with pt at bedside which includes DG Abd Acute W/ Chest and pt agreed to plan.  Labs (all labs ordered are listed, but only  abnormal results are displayed) Labs Reviewed - No data to display  EKG  EKG Interpretation None       Radiology Dg Abd Acute W/chest  Result Date: 09/18/2016 CLINICAL DATA:  Abdominal distention. No bowel movement for several days. EXAM: DG ABDOMEN ACUTE W/ 1V CHEST COMPARISON:  None. FINDINGS: There is no evidence of dilated bowel loops or free intraperitoneal air. No radiopaque calculi or other significant radiographic abnormality is seen. Heart size and mediastinal contours are within normal limits. Both lungs are clear. Generous colonic stool volume. IMPRESSION: Negative abdominal radiographs.  No acute cardiopulmonary disease. Electronically Signed   By: Andreas Newport M.D.   On: 09/18/2016 04:33    Procedures Procedures (including critical care time)  Medications Ordered in ED Medications  oxyCODONE-acetaminophen (PERCOCET/ROXICET) 5-325 MG per tablet 1 tablet (1 tablet Oral Given 09/18/16 0152)  ondansetron (ZOFRAN) injection 4 mg (4 mg Intravenous Given 09/18/16 0436)  HYDROmorphone (DILAUDID) injection 1 mg (1 mg Intravenous Given 09/18/16 0436)     Initial Impression / Assessment and Plan / ED Course  I have reviewed the triage vital signs and the  nursing notes.  Pertinent labs & imaging results that were available during my care of the patient were reviewed by me and considered in my medical decision making (see chart for details).  Clinical Course    Patient presents with complaints of neck and head pain as well as low back pain. Patient has chronic low back pain secondary to spinal stenosis. He reports that he finally get his pain under control when his pain specialist did an injection last week, that he went hunting and his back is been hurting ever since he climbed up into the tree stand. There is no significant change in the pain. He does not have any weakness in the lower extremities. Normal sensation in lower extremities. No sign of cauda equina syndrome or acute neurosurgical emergency in the back.  Patient complaining of neck pain. He was seen in the ER earlier for this. Examination is similar with signs of spasm and torticollis no radiculopathy. He is afebrile. No signs of meningismus. Patient appears well. Patient improved after Dilaudid. He is ready been prescribed Valium to use for the muscle spasms.  Patient also concerned about constipation. He is on large doses of opioid analgesics chronically. Abdominal exam was benign. X-ray does show a large stool burden but no obstruction or other pathology. Final Clinical Impressions(s) / ED Diagnoses   Final diagnoses:  Torticollis, acute  Drug-induced constipation  Spinal stenosis of lumbar region without neurogenic claudication    New Prescriptions New Prescriptions   MAGNESIUM CITRATE SOLN    Take 296 mLs (1 Bottle total) by mouth once.   POLYETHYLENE GLYCOL (MIRALAX / GLYCOLAX) PACKET    Take 17 g by mouth daily.     Orpah Greek, MD 09/18/16 [phone]

## 2016-09-19 DIAGNOSIS — M4317 Spondylolisthesis, lumbosacral region: Secondary | ICD-10-CM | POA: Diagnosis not present

## 2016-09-19 DIAGNOSIS — M47817 Spondylosis without myelopathy or radiculopathy, lumbosacral region: Secondary | ICD-10-CM | POA: Diagnosis not present

## 2016-10-02 ENCOUNTER — Encounter: Payer: Self-pay | Admitting: Gastroenterology

## 2016-10-02 ENCOUNTER — Ambulatory Visit (AMBULATORY_SURGERY_CENTER): Payer: Medicare Other | Admitting: Gastroenterology

## 2016-10-02 VITALS — BP 136/85 | HR 62 | Temp 97.3°F | Resp 14 | Ht 72.0 in | Wt 259.0 lb

## 2016-10-02 DIAGNOSIS — R1319 Other dysphagia: Secondary | ICD-10-CM | POA: Diagnosis not present

## 2016-10-02 DIAGNOSIS — R1310 Dysphagia, unspecified: Secondary | ICD-10-CM | POA: Diagnosis not present

## 2016-10-02 DIAGNOSIS — K222 Esophageal obstruction: Secondary | ICD-10-CM

## 2016-10-02 MED ORDER — SODIUM CHLORIDE 0.9 % IV SOLN: 500.0000 mL | INTRAVENOUS | Status: DC

## 2016-10-02 NOTE — Op Note (Signed)
[facility] Patient Name: Ricky Meyers Procedure Date: 10/02/2016 9:49 AM MRN: [id number] Endoscopist: Milus Banister , MD Age: 70 Referring MD:  Date of Birth: 1945/12/27 Gender: Male Account #: [id number] Procedure:                Upper GI endoscopy Indications:              Dysphagia, excessive phlegm Medicines:                Monitored Anesthesia Care Procedure:                Pre-Anesthesia Assessment:                           - Prior to the procedure, a History and Physical                            was performed, and patient medications and                            allergies were reviewed. [name] tolerance of                            previous anesthesia was also reviewed. The risks                            and benefits of the procedure and the sedation                            options and risks were discussed with the patient.                            All questions were answered, and informed consent                            was obtained. Prior Anticoagulants: The patient has                            taken no previous anticoagulant or antiplatelet                            agents. ASA Grade Assessment: II - A patient with                            mild systemic disease. After reviewing the risks                            and benefits, the patient was deemed in                            satisfactory condition to undergo the procedure.                           After obtaining informed consent, the endoscope was  passed under direct vision. Throughout the                            procedure, the patient's blood pressure, pulse, and                            oxygen saturations were monitored continuously. The                            Model GIF-HQ190 [id number]) scope was introduced                            through the mouth, and advanced to the second part                            of duodenum. The upper GI  endoscopy was                            accomplished without difficulty. The patient                            tolerated the procedure well. Scope In: Scope Out: Findings:                 One mild benign-appearing, intrinsic stenosis was                            found at the gastroesophageal junction (focal                            peptic stricture vs. thick Schatzki's ring). A TTS                            dilator was passed through the scope. Dilation with                            an 18-19-20 mm balloon dilator was performed to 20                            mm.                           A 2 cm hiatal hernia was present.                           The exam was otherwise without abnormality. Complications:            No immediate complications. Estimated blood loss:                            None. Estimated Blood Loss:     Estimated blood loss: none. Impression:               - Benign-appearing esophageal stenosis. Dilated.                           -  2 cm hiatal hernia.                           - The examination was otherwise normal. Recommendation:           - Patient has a contact number available for                            emergencies. The signs and symptoms of potential                            delayed complications were discussed with the                            patient. Return to normal activities tomorrow.                            Written discharge instructions were provided to the                            patient.                           - Resume previous diet.                           - Continue present medications.                           - Repeat upper endoscopy PRN for retreatment. Milus Banister, MD 10/02/2016 10:52:06 AM This report has been signed electronically.

## 2016-10-02 NOTE — Progress Notes (Signed)
Called to room to assist during endoscopic procedure.  Patient ID and intended procedure confirmed with present staff. Received instructions for my participation in the procedure from the performing physician.  

## 2016-10-02 NOTE — Patient Instructions (Signed)
YOU HAD AN ENDOSCOPIC PROCEDURE TODAY AT [facility]:   Refer to the procedure report that was given to you for any specific questions about what was found during the examination.  If the procedure report does not answer your questions, please call your gastroenterologist to clarify.  If you requested that your care partner not be given the details of your procedure findings, then the procedure report has been included in a sealed envelope for you to review at your convenience later.  YOU SHOULD EXPECT: Some feelings of bloating in the abdomen. Passage of more gas than usual.  Walking can help get rid of the air that was put into your GI tract during the procedure and reduce the bloating. If you had a lower endoscopy (such as a colonoscopy or flexible sigmoidoscopy) you may notice spotting of blood in your stool or on the toilet paper. If you underwent a bowel prep for your procedure, you may not have a normal bowel movement for a few days.  Please Note:  You might notice some irritation and congestion in your nose or some drainage.  This is from the oxygen used during your procedure.  There is no need for concern and it should clear up in a day or so.  SYMPTOMS TO REPORT IMMEDIATELY:     Following upper endoscopy (EGD)  Vomiting of blood or coffee ground material  New chest pain or pain under the shoulder blades  Painful or persistently difficult swallowing  New shortness of breath  Fever of 100F or higher  Black, tarry-looking stools  For urgent or emergent issues, a gastroenterologist can be reached at any hour by calling [phone].   DIET:  Follow Dilation Handout.  ACTIVITY:  You should plan to take it easy for the rest of today and you should NOT DRIVE or use heavy machinery until tomorrow (because of the sedation medicines used during the test).    FOLLOW UP: Our staff will call the number listed on your records the next business day following your procedure  to check on you and address any questions or concerns that you may have regarding the information given to you following your procedure. If we do not reach you, we will leave a message.  However, if you are feeling well and you are not experiencing any problems, there is no need to return our call.  We will assume that you have returned to your regular daily activities without incident.  If any biopsies were taken you will be contacted by phone or by letter within the next 1-3 weeks.  Please call us at [phone] if you have not heard about the biopsies in 3 weeks.    SIGNATURES/CONFIDENTIALITY: You and/or your care partner have signed paperwork which will be entered into your electronic medical record.  These signatures attest to the fact that that the information above on your After Visit Summary has been reviewed and is understood.  Full responsibility of the confidentiality of this discharge information lies with you and/or your care-partner.    Resume medications. Information given on Hiatal Hernia, Stricture and Dilation Diet.

## 2016-10-02 NOTE — Progress Notes (Signed)
A/ox3 pleased with MAC, report to Sheila RN 

## 2016-10-03 ENCOUNTER — Telehealth: Payer: Self-pay

## 2016-10-03 ENCOUNTER — Telehealth: Payer: Self-pay | Admitting: *Deleted

## 2016-10-03 ENCOUNTER — Other Ambulatory Visit: Payer: Self-pay | Admitting: Family Medicine

## 2016-10-03 ENCOUNTER — Encounter: Payer: Self-pay | Admitting: Family Medicine

## 2016-10-03 DIAGNOSIS — M47817 Spondylosis without myelopathy or radiculopathy, lumbosacral region: Secondary | ICD-10-CM | POA: Diagnosis not present

## 2016-10-03 DIAGNOSIS — R030 Elevated blood-pressure reading, without diagnosis of hypertension: Secondary | ICD-10-CM | POA: Diagnosis not present

## 2016-10-03 NOTE — Telephone Encounter (Signed)
Message left

## 2016-10-03 NOTE — Telephone Encounter (Signed)
  Follow up Call-  Call back number 10/02/2016  Post procedure Call Back phone  # [phone]  Permission to leave phone message Yes  Some recent data might be hidden     Patient questions:  Do you have a fever, pain , or abdominal swelling? No. Pain Score  0 *  Have you tolerated food without any problems? Yes.    Have you been able to return to your normal activities? Yes.    Do you have any questions about your discharge instructions: Diet   No. Medications  No. Follow up visit  No.  Do you have questions or concerns about your Care? No.  Actions: * If pain score is 4 or above: No action needed, pain <4.

## 2016-10-07 MED ORDER — OXYCODONE HCL 10 MG PO TABS: 10.0000 mg | ORAL_TABLET | Freq: Four times a day (QID) | ORAL | 0 refills | Status: DC

## 2016-10-07 NOTE — Telephone Encounter (Signed)
done

## 2016-10-08 NOTE — Telephone Encounter (Signed)
Script is ready for pick up here at front office and I left a voice message for pt.  

## 2016-10-16 ENCOUNTER — Telehealth: Payer: Self-pay | Admitting: Family Medicine

## 2016-10-16 NOTE — Telephone Encounter (Signed)
Pt went to give blood yesterday and they would not accept him. They said his heartbeat skipped 12 x and another one ttook his pulse and they got 15X. Pt wants to know if this is anything he should be concerned about?

## 2016-10-16 NOTE — Telephone Encounter (Signed)
If he has chest pain or SOB, he should go to the ER. If he feels okay, have him make an OV with [place] soon

## 2016-10-17 NOTE — Telephone Encounter (Signed)
I spoke with pt and he does not have any symptoms at this time. He will schedule a follow up to see Dr. Sarajane Jews.

## 2016-10-22 ENCOUNTER — Ambulatory Visit (INDEPENDENT_AMBULATORY_CARE_PROVIDER_SITE_OTHER): Payer: Medicare Other | Admitting: Family Medicine

## 2016-10-22 ENCOUNTER — Encounter: Payer: Self-pay | Admitting: Family Medicine

## 2016-10-22 ENCOUNTER — Telehealth: Payer: Self-pay | Admitting: Family Medicine

## 2016-10-22 VITALS — BP 155/84 | HR 77 | Temp 95.7°F | Resp 18 | Wt 260.0 lb

## 2016-10-22 DIAGNOSIS — I10 Essential (primary) hypertension: Secondary | ICD-10-CM | POA: Diagnosis not present

## 2016-10-22 DIAGNOSIS — E785 Hyperlipidemia, unspecified: Secondary | ICD-10-CM | POA: Diagnosis not present

## 2016-10-22 DIAGNOSIS — I447 Left bundle-branch block, unspecified: Secondary | ICD-10-CM | POA: Insufficient documentation

## 2016-10-22 DIAGNOSIS — R002 Palpitations: Secondary | ICD-10-CM | POA: Diagnosis not present

## 2016-10-22 DIAGNOSIS — R0609 Other forms of dyspnea: Secondary | ICD-10-CM | POA: Insufficient documentation

## 2016-10-22 DIAGNOSIS — I493 Ventricular premature depolarization: Secondary | ICD-10-CM | POA: Insufficient documentation

## 2016-10-22 NOTE — Progress Notes (Signed)
Ricky Meyers , Nov 27, 1945, 70 y.o., male MRN: [id number] Patient Care Team    Relationship Specialty Notifications Start End  [name], MD PCP - General   10/17/10     CC: irregular heart beat  Subjective: Pt presents for an acute OV with complaints of irregular heart beat of 6 days  duration.  Associated symptoms include nothing by his report. He denies chest pain, shortness of breath (more than usual), dizziness, syncope or lower ext edema. He is with his wife today who states he has had quick flashes of sharp pain over the past week. Pt states "not really". He then endorses occasional quick fleeting chest pains, that can occur anytime even when sitting and quickly resolve. After much debate, he does agree that he is more winded with climbing stairs and walking to the mailbox than he has been in the past.  He reports about 1 week ago he was going to give blood and they told him he could not because he has an irregular heart rate. His daughter then palpated his wrist and told him she could feel an irregularity. Since that time his family has encouraged him to be seen. He states he had a cardiologist about 15 years ago, and was diagnosed with a LBBB. He has a h/o HTN, hyperlipidemia and is prescribed statin, ACEi-hctz. He is on indomethacin TID.He has a family h/o stroke.   No Known Allergies Social History  Substance Use Topics  . Smoking status: Never Smoker  . Smokeless tobacco: Never Used  . Alcohol use 0.0 oz/week     Comment: occ   Past Medical History:  Diagnosis Date  . Arthritis    neck and back   . Chronic neck pain   . Depression   . ED (erectile dysfunction)   . Gout    sees Dr. Leigh Aurora   . Hyperlipidemia   . Hypertension   . Prostate cancer [facility])    history of   Past Surgical History:  Procedure Laterality Date  . CERVICAL FUSION  12/02/07   per Dr. Sherley Bounds  . COLONOSCOPY  12/22/06   per Dr. Ardis Hughs, repeat in 10 yrs  . injection lower back Right  09/17/2016  . PROSTATECTOMY    . TONSILLECTOMY    . VASECTOMY     Family History  Problem Relation Age of Onset  . Cancer Other     breast, porstate  . Hypertension Other   . Stroke Other    Allergies as of 10/22/2016   No Known Allergies     Medication List       Accurate as of 10/22/16  3:59 PM. Always use your most recent med list.          allopurinol 300 MG tablet Commonly known as:  ZYLOPRIM Take 1 tablet (300 mg total) by mouth daily.   atorvastatin 40 MG tablet Commonly known as:  LIPITOR TAKE ONE TABLET BY MOUTH ONCE DAILY   cyclobenzaprine 10 MG tablet Commonly known as:  FLEXERIL Take 1 tablet (10 mg total) by mouth 3 (three) times daily as needed. for muscle spams   diazepam 5 MG tablet Commonly known as:  VALIUM Take 1 tablet (5 mg total) by mouth every 8 (eight) hours as needed for anxiety.   gabapentin 100 MG capsule Commonly known as:  NEURONTIN Take 1 capsule (100 mg total) by mouth 2 (two) times daily.   indomethacin 50 MG capsule Commonly known as:  INDOCIN Take  1 capsule (50 mg total) by mouth 3 (three) times daily with meals.   lisinopril-hydrochlorothiazide 20-25 MG tablet Commonly known as:  PRINZIDE,ZESTORETIC TAKE ONE TABLET BY MOUTH ONCE DAILY   Oxycodone HCl 10 MG Tabs Take 1 tablet (10 mg total) by mouth every 6 (six) hours.   pantoprazole 40 MG tablet Commonly known as:  PROTONIX Take 1 tablet (40 mg total) by mouth at bedtime.   PARoxetine 20 MG tablet Commonly known as:  PAXIL Take 1 tablet (20 mg total) by mouth every morning.   polyethylene glycol packet Commonly known as:  MIRALAX / GLYCOLAX Take 17 g by mouth daily.   psyllium 58.6 % packet Commonly known as:  METAMUCIL Take 1 packet by mouth daily as needed (for constipation).       No results found for this or any previous visit (from the past 24 hour(s)). No results found.   ROS: Negative, with the exception of above mentioned in HPI   Objective:    BP (!) 155/84 (BP Location: Left Arm, Patient Position: Sitting, Cuff Size: Large)   Pulse 77   Temp (!) 95.7 F (35.4 C) (Temporal)   Resp 18   Wt 260 lb (117.9 kg)   SpO2 95%   BMI 35.26 kg/m  Body mass index is 35.26 kg/m. Gen: Afebrile. No acute distress. Nontoxic in appearance, well developed, well nourished.  HENT: AT. [place].  MMM Eyes:Pupils Equal Round Reactive to light, Extraocular movements intact,  Conjunctiva without redness, discharge or icterus. Neck/lymp/endocrine: Supple,no lymphadenopathy CV: RRR, no edema Chest: CTAB, no wheeze or crackles. Good air movement, normal resp effort.  Abd: Soft. Obese. NTND. BS present.  Skin: no rashes, purpura or petechiae.  Neuro: Normal gait. PERLA. EOMi. Alert. Oriented x3  Psych: Normal affect, dress and demeanor. Normal speech. Normal thought content and judgment. EKG: HR 76, PR 176, QT418, QRSD 150. Sinus rhythem frequent ectopic ventricular beats, #VECs =3, LBBB  Assessment/Plan: Ricky Meyers is a 70 y.o. male present for acute OV for irregular heart beat,  LBBB/quadgimeny Palpitations/Essential hypertension/Hyperlipidemia, unspecified hyperlipidemia type/Dyspnea on exertion - EKG 12-Lead: ectopic beats and LBBB (known). EKG similar to prior EKGs.  - discussed with pt and his wife today the results of EKG. He currently has no symptoms, but does endorse increase shortness of breath with walking to mailbox and upstairs. His family has concerns and he has significant family history. He is agreeable to cardiology referral today for further evaluation.  - Cardiology referral - pt agreed to report directly to ED if he has any sustained chest pain > 5 minutes or other ACS symptoms which was provided to him in the AVS today.   electronically signed by:  Howard Pouch, DO  [place]

## 2016-10-22 NOTE — Telephone Encounter (Signed)
Noted  

## 2016-10-22 NOTE — Telephone Encounter (Signed)
Patient Name: MARQUI DEHAVEN DOB: July 11, 1946 Initial Comment caller states his heart beat is irregular Nurse Assessment Nurse: Vallery Sa, RN, [place] Date/Time (Eastern Time): 10/22/2016 11:57:26 AM Confirm and document reason for call. If symptomatic, describe symptoms. ---Berneta Sages states he developed an irregular heart beat about a week ago. No breathing difficulty. No fever. Alert and responsive. No chest pain. Does the patient have any new or worsening symptoms? ---Yes Will a triage be completed? ---Yes Related visit to physician within the last 2 weeks? ---No Does the PT have any chronic conditions? (i.e. diabetes, asthma, etc.) ---Yes List chronic conditions. ---High Blood Pressure and Cholesterol, Gout, Overweight Is this a behavioral health or substance abuse call? ---No Guidelines Guideline Title Affirmed Question Affirmed Notes Heart Rate and Heartbeat Questions New or worsened shortness of breath with activity (dyspnea on exertion) Final Disposition User Go to ED Now (or PCP triage) Vallery Sa, RN, Cathy Comments Scheduled for 3:15pm appointment today with Howard Pouch at [facility] office. Referrals REFERRED TO PCP OFFICE Disagree/Comply: Comply

## 2016-10-22 NOTE — Progress Notes (Signed)
Pre visit review using our clinic review tool, if applicable. No additional management support is needed unless otherwise documented below in the visit note. 

## 2016-10-22 NOTE — Patient Instructions (Signed)
It was a pleasure meeting you today.  Your EKG today looks similar to prior EKG.   I would like you to watch for any signs of chest pain, shortness of breath, decrease work tolerance  To be seen immediatly. If Chest pain > 5 minutes, go TO ED.  I will refer you to cardiology to have further evaluation with your LBBB and increased dyspnea with exertion.       Acute Coronary Syndrome Acute coronary syndrome (ACS) is a serious problem in which there is suddenly not enough blood and oxygen supplied to the heart. ACS may mean that one or more of the blood vessels in your heart (coronary arteries) may be blocked. ACS can result in chest pain or a heart attack (myocardial infarction or MI). What are the causes? This condition is caused by atherosclerosis, which is the buildup of fat and cholesterol (plaque) on the inside of the arteries. Over time, the plaque may narrow or block the artery, and this will lessen blood flow to the heart. Plaque can also become weak and break off within a coronary artery to form a clot and cause a sudden blockage. What increases the risk? The risk factors of this condition include:  High cholesterol levels.  High blood pressure (hypertension).  Smoking.  Diabetes.  Age.  Family history of chest pain, heart disease, or stroke.  Lack of exercise. What are the signs or symptoms? The most common signs of this condition include:  Chest pain, which can be:  A crushing or squeezing in the chest.  A tightness, pressure, fullness, or heaviness in the chest.  Present for more than a few minutes, or it can stop and recur.  Pain in the arms, neck, jaw, or back.  Unexplained heartburn or indigestion.  Shortness of breath.  Nausea.  Sudden cold sweats.  Feeling light-headed or dizzy. Sometimes, this condition has no symptoms. How is this diagnosed? ACS may be diagnosed through the following tests:  Electrocardiogram (ECG).  Blood tests.  Coronary  angiogram. This is a procedure to look at the coronary arteries to see if there is any blockage. How is this treated? Treatment for ACS may include:  Healthy behavioral changes to reduce or control risk factors.  Medicine.  Coronary stenting.A stent helps to keep an artery open.  Coronary angioplasty. This procedure widens a narrowed or blocked artery.  Coronary artery bypass surgery. This will allow your blood to pass the blockage (bypass) to reach your heart. Follow these instructions at home: Eating and drinking  Follow a heart-healthy diet. A dietitian can you help to educate you about healthy food options and changes.  Use healthy cooking methods such as roasting, grilling, broiling, baking, poaching, steaming, or stir-frying. Talk to a dietitian to learn more about healthy cooking methods. Medicines  Take medicines only as directed by your health care provider.  Do not take the following medicines unless your health care provider approves:  Nonsteroidal anti-inflammatory drugs (NSAIDs), such as ibuprofen, naproxen, or celecoxib.  Vitamin supplements that contain vitamin A, vitamin E, or both.  Hormone replacement therapy that contains estrogen with or without progestin.  Stop illegal drug use. Activity  Follow an exercise program that is approved by your health care provider.  Plan rest periods when you are fatigued. Lifestyle  Do not use any tobacco products, including cigarettes, chewing tobacco, or electronic cigarettes. If you need help quitting, ask your health care provider.  If you drink alcohol, and your health care provider approves, limit  your alcohol intake to no more than 1 drink per day. One drink equals 12 ounces of beer, 5 ounces of wine, or 1 ounces of hard liquor.  Learn to manage stress.  Maintain a healthy weight. Lose weight as approved by your health care provider. General instructions  Manage other health conditions, such as hypertension  and diabetes, as directed by your health care provider.  Keep all follow-up visits as directed by your health care provider. This is important.  Your health care provider may ask you to monitor your blood pressure. A blood pressure reading consists of a higher number over a lower number, such as 110 over 72, written as 110/72. Ideally, your blood pressure should be:  Below 140/90 if you have no other medical conditions.  Below 130/80 if you have diabetes or kidney disease. Get help right away if:  You have pain in your chest, neck, arm, jaw, stomach, or back that lasts more than a few minutes, is recurring, or is not relieved by taking medicine under your tongue (sublingual nitroglycerin).  You have profuse sweating without cause.  You have unexplained:  Heartburn or indigestion.  Shortness of breath or difficulty breathing.  Nausea or vomiting.  Fatigue.  Feelings of nervousness or anxiety.  Weakness.  Diarrhea.  You have sudden light-headedness or dizziness.  You faint. These symptoms may represent a serious problem that is an emergency. Do not wait to see if the symptoms will go away. Get medical help right away. Call your local emergency services (911 in the [place] Do not drive yourself to the clinic or hospital.  This information is not intended to replace advice given to you by your health care provider. Make sure you discuss any questions you have with your health care provider. Document Released: 10/21/2005 Document Revised: 04/03/2016 Document Reviewed: 02/22/2014 Elsevier Interactive Patient Education  2017 [employer].

## 2016-10-24 NOTE — Progress Notes (Signed)
Cardiology Office Note   Date:  10/25/2016   ID:  Ricky Meyers, Ricky Meyers 1946-08-05, MRN [id number]  PCP:  Alysia Penna, MD    No chief complaint on file. palpitations   Wt Readings from Last 3 Encounters:  10/25/16 253 lb (114.8 kg)  10/22/16 260 lb (117.9 kg)  10/02/16 259 lb (117.5 kg)       History of Present Illness: Ricky Meyers is a 70 y.o. male  who has had an irregular heart beat.  He was not allowed to give blood because of the irregularity.  He has been more winded when walking up stairs or to the mailbox.  ECG done at [facility] on 10/22/16 showed NSR with PVCs.  He saw a cardiologist a few years ago.  He had a nuclear stress test- about 10-15 years ago prior to a surgery.  A bundle branch block was noted at that time.    Irregular heart beat noted at blood donation prompted this visit.  He has back problems which limits his walking.  He has gained weight in the past few years.  No chest pain.  No problems lying flat.      Past Medical History:  Diagnosis Date  . Arthritis    neck and back   . Chronic neck pain   . Depression   . ED (erectile dysfunction)   . Gout    sees Dr. Leigh Aurora   . Hyperlipidemia   . Hypertension   . Prostate cancer [facility])    history of    Past Surgical History:  Procedure Laterality Date  . CERVICAL FUSION  12/02/07   per Dr. Sherley Bounds  . COLONOSCOPY  12/22/06   per Dr. Ardis Hughs, repeat in 10 yrs  . injection lower back Right 09/17/2016  . PROSTATECTOMY    . TONSILLECTOMY    . VASECTOMY       Current Outpatient Prescriptions  Medication Sig Dispense Refill  . allopurinol (ZYLOPRIM) 300 MG tablet Take 1 tablet (300 mg total) by mouth daily. 30 tablet 6  . atorvastatin (LIPITOR) 40 MG tablet TAKE ONE TABLET BY MOUTH ONCE DAILY 90 tablet 3  . cyclobenzaprine (FLEXERIL) 10 MG tablet Take 1 tablet (10 mg total) by mouth 3 (three) times daily as needed. for muscle spams 90 tablet 5  . gabapentin (NEURONTIN) 100 MG capsule  Take 1 capsule (100 mg total) by mouth 2 (two) times daily. 180 capsule 3  . indomethacin (INDOCIN) 50 MG capsule Take 1 capsule (50 mg total) by mouth 3 (three) times daily with meals. 90 capsule 5  . lisinopril-hydrochlorothiazide (PRINZIDE,ZESTORETIC) 20-25 MG tablet TAKE ONE TABLET BY MOUTH ONCE DAILY 90 tablet 3  . Oxycodone HCl 10 MG TABS Take 1 tablet (10 mg total) by mouth every 6 (six) hours. 120 tablet 0  . pantoprazole (PROTONIX) 40 MG tablet Take 1 tablet (40 mg total) by mouth at bedtime. 90 tablet 3  . PARoxetine (PAXIL) 20 MG tablet Take 1 tablet (20 mg total) by mouth every morning. 90 tablet 3  . psyllium (METAMUCIL) 58.6 % packet Take 1 packet by mouth daily as needed (for constipation).      Current Facility-Administered Medications  Medication Dose Route Frequency Provider Last Rate Last Dose  . 0.9 %  sodium chloride infusion  500 mL Intravenous Continuous Milus Banister, MD        Allergies:   Patient has no known allergies.    Social History:  The patient  reports that he has never smoked. He has never used smokeless tobacco. He reports that he drinks alcohol. He reports that he does not use drugs.   Family History:  [name] family history includes Cancer in his other; Hypertension in his other; Stroke in his other.  Uncles with MI.  ROS:  Please see the history of present illness.   Otherwise, review of systems are positive for weight gain.   All other systems are reviewed and negative.    PHYSICAL EXAM: VS:  BP 140/90 (BP Location: Left Arm)   Pulse 61   Ht 6' (1.829 m)   Wt 253 lb (114.8 kg)   BMI 34.31 kg/m  , BMI Body mass index is 34.31 kg/m. GEN: Well nourished, well developed, in no acute distress  HEENT: normal  Neck: no JVD, carotid bruits, or masses Cardiac: RRR; no murmurs, rubs, or gallops,no edema  Respiratory:  clear to auscultation bilaterally, normal work of breathing GI: soft, nontender, nondistended, + BS MS: no deformity or  atrophy  Skin: warm and dry, no rash Neuro:  Strength and sensation are intact Psych: euthymic mood, full affect   EKG:   The ekg ordered today demonstrates NSR, LBBB   Recent Labs: 03/07/2016: ALT 17; BUN 24; Creatinine, Ser 1.22; Hemoglobin 13.7; Platelets 186.0; Potassium 5.2; Sodium 142; TSH 0.94   Lipid Panel    Component Value Date/Time   CHOL 148 03/07/2016 1051   TRIG 53.0 03/07/2016 1051   HDL 63.80 03/07/2016 1051   CHOLHDL 2 03/07/2016 1051   VLDL 10.6 03/07/2016 1051   LDLCALC 74 03/07/2016 1051   LDLDIRECT 161.4 09/29/2012 0811     Other studies Reviewed: Additional studies/ records that were reviewed today with results demonstrating: ECG from 12/19 showed NSR with PVCs.   ASSESSMENT AND PLAN:  1. PVCs: asymptomatic.  LBBB is known from years ago.  No evidence of AFib.  Today, no PVCs on ECG.  No further medicines needed.    2. HTN: BP elevated today.  I encouraged him To decrease salt intake. He needs to try to exercise more. He has access to a recumbent bike. This will probably work best for him in his back issues. He will follow-up with his primary care physician regarding this. 3. We went over the symptoms of heart failure and angina. He will let us know if he has any such symptoms. 4. Obesity: We talked about weight loss and how that would help his back and blood pressure. He states that after Christmas, he will try to be more vigilant. 5. Hyperlipidemia: Continue atorvastatin. Lipids well controlled. 6. He thinks DOE is from weight gain.  He would like to try to increase exercise and see how he feels.   Current medicines are reviewed at length with the patient today.  The patient concerns regarding his medicines were addressed.  The following changes have been made:  No change  Labs/ tests ordered today include:  No orders of the defined types were placed in this encounter.   Recommend 150 minutes/week of aerobic exercise Low fat, low carb, high fiber  diet recommended  Disposition:   FU prn   Signed, Larae Grooms, MD  10/25/2016 9:18 AM    [facility] [address], [place], [place]  [address] Phone: [phone]; Fax: [phone]

## 2016-10-25 ENCOUNTER — Encounter: Payer: Self-pay | Admitting: Interventional Cardiology

## 2016-10-25 ENCOUNTER — Ambulatory Visit (INDEPENDENT_AMBULATORY_CARE_PROVIDER_SITE_OTHER): Payer: Medicare Other | Admitting: Interventional Cardiology

## 2016-10-25 VITALS — BP 140/90 | HR 61 | Ht 72.0 in | Wt 253.0 lb

## 2016-10-25 DIAGNOSIS — I493 Ventricular premature depolarization: Secondary | ICD-10-CM | POA: Diagnosis not present

## 2016-10-25 DIAGNOSIS — I10 Essential (primary) hypertension: Secondary | ICD-10-CM | POA: Diagnosis not present

## 2016-10-25 DIAGNOSIS — E785 Hyperlipidemia, unspecified: Secondary | ICD-10-CM

## 2016-10-25 DIAGNOSIS — I447 Left bundle-branch block, unspecified: Secondary | ICD-10-CM

## 2016-10-25 DIAGNOSIS — R0609 Other forms of dyspnea: Secondary | ICD-10-CM

## 2016-10-25 NOTE — Patient Instructions (Signed)
Medication Instructions:  The current medical regimen is effective;  continue present plan and medications.  Follow-Up: Follow up as needed.  Thank you for choosing  [facility]!!     

## 2016-11-16 DIAGNOSIS — Z23 Encounter for immunization: Secondary | ICD-10-CM | POA: Diagnosis not present

## 2016-11-18 ENCOUNTER — Encounter: Payer: Self-pay | Admitting: Gastroenterology

## 2016-12-10 ENCOUNTER — Encounter: Payer: Self-pay | Admitting: Family Medicine

## 2016-12-10 ENCOUNTER — Ambulatory Visit (INDEPENDENT_AMBULATORY_CARE_PROVIDER_SITE_OTHER): Payer: Medicare Other | Admitting: Family Medicine

## 2016-12-10 VITALS — BP 178/108 | Temp 97.6°F | Ht 72.0 in | Wt 256.0 lb

## 2016-12-10 DIAGNOSIS — R490 Dysphonia: Secondary | ICD-10-CM

## 2016-12-10 DIAGNOSIS — I10 Essential (primary) hypertension: Secondary | ICD-10-CM

## 2016-12-10 MED ORDER — AMLODIPINE BESYLATE 5 MG PO TABS: 5.0000 mg | ORAL_TABLET | Freq: Every day | ORAL | 3 refills | Status: DC

## 2016-12-10 NOTE — Progress Notes (Signed)
Pre visit review using our clinic review tool, if applicable. No additional management support is needed unless otherwise documented below in the visit note. 

## 2016-12-10 NOTE — Progress Notes (Signed)
   Subjective:    Patient ID: Ricky Meyers, male    DOB: [birth year], 71 y.o.   MRN: [id number]  HPI Here for elevated BP. I see in his chart that the BP has been slowly going up in the past month. He went to the dentist today for a cleaning and they found it to be quite high, so he came to [place]. He feels fine. He takes his Lisinopril HCT regularly. The other thing he mentions today is chronic hoarseness. We discussed this several times last fall. This started about 6 months ago. He recently had upper endoscopy and had a Schatzki ring dilated.    Review of Systems  Constitutional: Negative.   HENT: Positive for voice change. Negative for congestion, postnasal drip, sinus pain, sinus pressure, sore throat and trouble swallowing.   Respiratory: Negative.   Cardiovascular: Negative.   Neurological: Negative.        Objective:   Physical Exam  Constitutional: He is oriented to person, place, and time. He appears well-developed and well-nourished. No distress.  HENT:  Right Ear: External ear normal.  Left Ear: External ear normal.  Nose: Nose normal.  Mouth/Throat: Oropharynx is clear and moist.  Eyes: Conjunctivae are normal.  Neck: Neck supple. No thyromegaly present.  Cardiovascular: Normal rate, regular rhythm, normal heart sounds and intact distal pulses.   Pulmonary/Chest: Effort normal and breath sounds normal.  Musculoskeletal: He exhibits no edema.  Lymphadenopathy:    He has no cervical adenopathy.  Neurological: He is alert and oriented to person, place, and time.          Assessment & Plan:  For the HTN we will add Amlodipine 5 mg daily, and he will return for a recheck in 3 weeks. For the hoarseness we will refer to ENT to evaluate.  Alysia Penna, MD

## 2016-12-31 ENCOUNTER — Encounter: Payer: Self-pay | Admitting: Family Medicine

## 2016-12-31 ENCOUNTER — Ambulatory Visit (INDEPENDENT_AMBULATORY_CARE_PROVIDER_SITE_OTHER): Payer: Medicare Other | Admitting: Family Medicine

## 2016-12-31 VITALS — BP 120/76 | Temp 98.3°F | Ht 72.0 in | Wt 256.0 lb

## 2016-12-31 DIAGNOSIS — I10 Essential (primary) hypertension: Secondary | ICD-10-CM

## 2016-12-31 NOTE — Progress Notes (Signed)
   Subjective:    Patient ID: Ricky Meyers, male    DOB: Feb 17, 1946, 71 y.o.   MRN: [id number]  HPI Here to recheck BP. Several weeks ago we added Amlodipine to his regimen. He feels fine and the BP has come down nicely.    Review of Systems  Constitutional: Negative.   Respiratory: Negative.   Cardiovascular: Negative.   Neurological: Negative.        Objective:   Physical Exam  Constitutional: He is oriented to person, place, and time. He appears well-developed and well-nourished.  Cardiovascular: Normal rate, regular rhythm, normal heart sounds and intact distal pulses.   Pulmonary/Chest: Effort normal and breath sounds normal.  Musculoskeletal: He exhibits no edema.  Neurological: He is alert and oriented to person, place, and time.          Assessment & Plan:  His HTN is now well controlled.  Alysia Penna, MD

## 2016-12-31 NOTE — Progress Notes (Signed)
Pre visit review using our clinic review tool, if applicable. No additional management support is needed unless otherwise documented below in the visit note. 

## 2017-01-14 ENCOUNTER — Encounter: Payer: Self-pay | Admitting: Gastroenterology

## 2017-01-21 DIAGNOSIS — M47817 Spondylosis without myelopathy or radiculopathy, lumbosacral region: Secondary | ICD-10-CM | POA: Diagnosis not present

## 2017-01-27 ENCOUNTER — Encounter: Payer: Self-pay | Admitting: Family Medicine

## 2017-01-28 ENCOUNTER — Encounter: Payer: Self-pay | Admitting: Family Medicine

## 2017-01-28 ENCOUNTER — Other Ambulatory Visit: Payer: Self-pay | Admitting: Family Medicine

## 2017-01-29 MED ORDER — OXYCODONE HCL 10 MG PO TABS: 10.0000 mg | ORAL_TABLET | Freq: Four times a day (QID) | ORAL | 0 refills | Status: DC

## 2017-01-29 NOTE — Telephone Encounter (Signed)
done

## 2017-02-18 DIAGNOSIS — Z6834 Body mass index (BMI) 34.0-34.9, adult: Secondary | ICD-10-CM | POA: Diagnosis not present

## 2017-02-18 DIAGNOSIS — M47817 Spondylosis without myelopathy or radiculopathy, lumbosacral region: Secondary | ICD-10-CM | POA: Diagnosis not present

## 2017-02-18 DIAGNOSIS — I10 Essential (primary) hypertension: Secondary | ICD-10-CM | POA: Diagnosis not present

## 2017-02-18 DIAGNOSIS — M4317 Spondylolisthesis, lumbosacral region: Secondary | ICD-10-CM | POA: Diagnosis not present

## 2017-02-25 ENCOUNTER — Telehealth: Payer: Self-pay | Admitting: Gastroenterology

## 2017-02-25 ENCOUNTER — Ambulatory Visit (AMBULATORY_SURGERY_CENTER): Payer: Self-pay | Admitting: *Deleted

## 2017-02-25 VITALS — Ht 72.0 in | Wt 254.6 lb

## 2017-02-25 DIAGNOSIS — Z1211 Encounter for screening for malignant neoplasm of colon: Secondary | ICD-10-CM

## 2017-02-25 MED ORDER — NA SULFATE-K SULFATE-MG SULF 17.5-3.13-1.6 GM/177ML PO SOLN: ORAL | 0 refills | Status: DC

## 2017-02-25 NOTE — Progress Notes (Signed)
Pt denies allergies to eggs or soy products. Denies difficulty with sedation or anesthesia. Denies any diet or weight loss medications. Denies use of supplemental oxygen.  Emmi instructions given for procedure.  

## 2017-02-25 NOTE — Telephone Encounter (Signed)
Called patient to explain medicare and [employer].  They should cover his prep, and we do not have a coupon available for him per protocol. I had to leave a message, because he didn't answer.

## 2017-02-27 ENCOUNTER — Ambulatory Visit (INDEPENDENT_AMBULATORY_CARE_PROVIDER_SITE_OTHER): Payer: Medicare Other | Admitting: Family Medicine

## 2017-02-27 ENCOUNTER — Encounter: Payer: Self-pay | Admitting: Family Medicine

## 2017-02-27 VITALS — BP 122/84 | Temp 98.5°F | Ht 72.0 in | Wt 252.0 lb

## 2017-02-27 DIAGNOSIS — L570 Actinic keratosis: Secondary | ICD-10-CM | POA: Diagnosis not present

## 2017-02-27 DIAGNOSIS — Z9109 Other allergy status, other than to drugs and biological substances: Secondary | ICD-10-CM

## 2017-02-27 DIAGNOSIS — W57XXXA Bitten or stung by nonvenomous insect and other nonvenomous arthropods, initial encounter: Secondary | ICD-10-CM | POA: Diagnosis not present

## 2017-02-27 MED ORDER — DOXYCYCLINE HYCLATE 100 MG PO CAPS: 100.0000 mg | ORAL_CAPSULE | Freq: Two times a day (BID) | ORAL | 0 refills | Status: AC

## 2017-02-27 MED ORDER — OXYCODONE HCL 10 MG PO TABS: 10.0000 mg | ORAL_TABLET | Freq: Four times a day (QID) | ORAL | 0 refills | Status: DC

## 2017-02-27 NOTE — Progress Notes (Signed)
   Subjective:    Patient ID: Ricky Meyers, male    DOB: Mar 24, 1946, 71 y.o.   MRN: [id number]  HPI Here for several issues. First he pulled a tick off his back 3 weeks ago and the spot is still red and itchy. He denies any other rashes or headache or fever, etc. Second he asks about rough areas of skin on the forearms. Third he asks about PND that causes him to clear his throat frequently. He is treated for GERD and he recently had upper endoscopy with a dilatation, and this did not help. Claritin does not help.    Review of Systems  Constitutional: Negative.   HENT: Positive for postnasal drip. Negative for sinus pain, sinus pressure and sore throat.   Respiratory: Negative.   Cardiovascular: Negative.   Musculoskeletal: Negative.   Skin: Positive for wound.  Neurological: Negative.        Objective:   Physical Exam  Constitutional: He is oriented to person, place, and time. He appears well-developed and well-nourished.  HENT:  Right Ear: External ear normal.  Left Ear: External ear normal.  Nose: Nose normal.  Mouth/Throat: Oropharynx is clear and moist.  Eyes: Conjunctivae are normal.  Neck: No thyromegaly present.  Cardiovascular: Normal rate, regular rhythm, normal heart sounds and intact distal pulses.   Pulmonary/Chest: Effort normal and breath sounds normal.  Lymphadenopathy:    He has no cervical adenopathy.  Neurological: He is alert and oriented to person, place, and time.  Skin:  Dorsal surfaces of both forearms have extensive actinic changes with scaling. The left upper back has a red nodular lesion           Assessment & Plan:  For the tick bite, cover with Doxycycline. For the PND try Xyzal prn. For the actinic skin damage, refer to Dermatology.  Alysia Penna, MD

## 2017-02-27 NOTE — Telephone Encounter (Signed)
Per pt he has [employer] part [employer] and they do not cover his prep. He states it's going to cost him $90.

## 2017-02-27 NOTE — Telephone Encounter (Signed)
Called pt.  He is willing to drive to [place] to pick up sample.  Left with receptionist on 4th floor-Suprep Sample Deangela Randleman/PV

## 2017-02-27 NOTE — Patient Instructions (Signed)
WE NOW OFFER   [facility] FAST TRACK!!!  SAME DAY Appointments for ACUTE CARE  Such as: Sprains, Injuries, cuts, abrasions, rashes, muscle pain, joint pain, back pain Colds, flu, sore throats, headache, allergies, cough, fever  Ear pain, sinus and eye infections Abdominal pain, nausea, vomiting, diarrhea, upset stomach Animal/insect bites  3 Easy Ways to Schedule: Walk-In Scheduling Call in scheduling Mychart Sign-up: [email or web address]         

## 2017-02-27 NOTE — Progress Notes (Signed)
Pre visit review using our clinic review tool, if applicable. No additional management support is needed unless otherwise documented below in the visit note. 

## 2017-03-11 ENCOUNTER — Encounter: Payer: Self-pay | Admitting: Gastroenterology

## 2017-03-11 ENCOUNTER — Ambulatory Visit (AMBULATORY_SURGERY_CENTER): Payer: Medicare Other | Admitting: Gastroenterology

## 2017-03-11 VITALS — BP 127/77 | HR 61 | Temp 97.1°F | Resp 16 | Ht 72.0 in | Wt 254.0 lb

## 2017-03-11 DIAGNOSIS — Z1211 Encounter for screening for malignant neoplasm of colon: Secondary | ICD-10-CM

## 2017-03-11 DIAGNOSIS — Z1212 Encounter for screening for malignant neoplasm of rectum: Secondary | ICD-10-CM | POA: Diagnosis not present

## 2017-03-11 DIAGNOSIS — K5730 Diverticulosis of large intestine without perforation or abscess without bleeding: Secondary | ICD-10-CM | POA: Diagnosis not present

## 2017-03-11 HISTORY — PX: COLONOSCOPY: SHX174

## 2017-03-11 MED ORDER — SODIUM CHLORIDE 0.9 % IV SOLN: 500.0000 mL | INTRAVENOUS | Status: DC

## 2017-03-11 NOTE — Patient Instructions (Signed)
YOU HAD AN ENDOSCOPIC PROCEDURE TODAY AT [facility]:   Refer to the procedure report that was given to you for any specific questions about what was found during the examination.  If the procedure report does not answer your questions, please call your gastroenterologist to clarify.  If you requested that your care partner not be given the details of your procedure findings, then the procedure report has been included in a sealed envelope for you to review at your convenience later.  YOU SHOULD EXPECT: Some feelings of bloating in the abdomen. Passage of more gas than usual.  Walking can help get rid of the air that was put into your GI tract during the procedure and reduce the bloating. If you had a lower endoscopy (such as a colonoscopy or flexible sigmoidoscopy) you may notice spotting of blood in your stool or on the toilet paper. If you underwent a bowel prep for your procedure, you may not have a normal bowel movement for a few days.  Please Note:  You might notice some irritation and congestion in your nose or some drainage.  This is from the oxygen used during your procedure.  There is no need for concern and it should clear up in a day or so.  SYMPTOMS TO REPORT IMMEDIATELY:   Following lower endoscopy (colonoscopy or flexible sigmoidoscopy):  Excessive amounts of blood in the stool  Significant tenderness or worsening of abdominal pains  Swelling of the abdomen that is new, acute  Fever of 100F or higher    For urgent or emergent issues, a gastroenterologist can be reached at any hour by calling [phone].   DIET:  We do recommend a small meal at first, but then you may proceed to your regular diet.  Drink plenty of fluids but you should avoid alcoholic beverages for 24 hours.  ACTIVITY:  You should plan to take it easy for the rest of today and you should NOT DRIVE or use heavy machinery until tomorrow (because of the sedation medicines used during the test).     FOLLOW UP: Our staff will call the number listed on your records the next business day following your procedure to check on you and address any questions or concerns that you may have regarding the information given to you following your procedure. If we do not reach you, we will leave a message.  However, if you are feeling well and you are not experiencing any problems, there is no need to return our call.  We will assume that you have returned to your regular daily activities without incident.  If any biopsies were taken you will be contacted by phone or by letter within the next 1-3 weeks.  Please call us at [phone] if you have not heard about the biopsies in 3 weeks.    SIGNATURES/CONFIDENTIALITY: You and/or your care partner have signed paperwork which will be entered into your electronic medical record.  These signatures attest to the fact that that the information above on your After Visit Summary has been reviewed and is understood.  Full responsibility of the confidentiality of this discharge information lies with you and/or your care-partner.   Information on diverticulosis given to you today

## 2017-03-11 NOTE — Op Note (Signed)
[facility] Patient Name: Ricky Meyers Procedure Date: 03/11/2017 11:01 AM MRN: [id number] Endoscopist: Milus Banister , MD Age: 71 Referring MD:  Date of Birth: 1946-05-14 Gender: Male Account #: [id number] Procedure:                Colonoscopy Indications:              Screening for colorectal malignant neoplasm Medicines:                Monitored Anesthesia Care Procedure:                Pre-Anesthesia Assessment:                           - Prior to the procedure, a History and Physical                            was performed, and patient medications and                            allergies were reviewed. [name] tolerance of                            previous anesthesia was also reviewed. The risks                            and benefits of the procedure and the sedation                            options and risks were discussed with the patient.                            All questions were answered, and informed consent                            was obtained. Prior Anticoagulants: The patient has                            taken no previous anticoagulant or antiplatelet                            agents. ASA Grade Assessment: II - A patient with                            mild systemic disease. After reviewing the risks                            and benefits, the patient was deemed in                            satisfactory condition to undergo the procedure.                           After obtaining informed consent, the colonoscope  was passed under direct vision. Throughout the                            procedure, the patient's blood pressure, pulse, and                            oxygen saturations were monitored continuously. The                            Colonoscope was introduced through the anus and                            advanced to the the cecum, identified by                            appendiceal orifice and  ileocecal valve. The                            colonoscopy was performed without difficulty. The                            patient tolerated the procedure well. The quality                            of the bowel preparation was good. The ileocecal                            valve, appendiceal orifice, and rectum were                            photographed. Scope In: 11:04:19 AM Scope Out: 11:19:22 AM Scope Withdrawal Time: 0 hours 12 minutes 22 seconds  Total Procedure Duration: 0 hours 15 minutes 3 seconds  Findings:                 A few small and large-mouthed diverticula were                            found in the left colon.                           The exam was otherwise without abnormality on                            direct and retroflexion views. Complications:            No immediate complications. Estimated blood loss:                            None. Estimated Blood Loss:     Estimated blood loss: none. Impression:               - Diverticulosis in the left colon.                           - The examination was otherwise normal on direct  and retroflexion views.                           - No specimens collected. Recommendation:           - Patient has a contact number available for                            emergencies. The signs and symptoms of potential                            delayed complications were discussed with the                            patient. Return to normal activities tomorrow.                            Written discharge instructions were provided to the                            patient.                           - Resume previous diet.                           - Continue present medications.                           - You do not need any further colon cancer                            screening tests (including stool testing). These                            types of tests generally stop around age  64-80. Milus Banister, MD 03/11/2017 11:21:52 AM This report has been signed electronically.

## 2017-03-11 NOTE — Progress Notes (Signed)
Spontaneous respirations throughout. VSS. Resting comfortably. To PACU on room air. Report to  Penny RN.  

## 2017-03-12 ENCOUNTER — Telehealth: Payer: Self-pay | Admitting: *Deleted

## 2017-03-12 NOTE — Telephone Encounter (Signed)
  Follow up Call-  Call back number 03/11/2017 10/02/2016  Post procedure Call Back phone  # [phone]  Permission to leave phone message Yes Yes  Some recent data might be hidden     Patient questions:  Do you have a fever, pain , or abdominal swelling? No. Pain Score  0 *  Have you tolerated food without any problems? Yes.    Have you been able to return to your normal activities? Yes.    Do you have any questions about your discharge instructions: Diet   No. Medications  No. Follow up visit  No.  Do you have questions or concerns about your Care? No.  Actions: * If pain score is 4 or above: No action needed, pain <4.

## 2017-03-13 DIAGNOSIS — M47817 Spondylosis without myelopathy or radiculopathy, lumbosacral region: Secondary | ICD-10-CM | POA: Diagnosis not present

## 2017-04-01 ENCOUNTER — Other Ambulatory Visit: Payer: Self-pay | Admitting: Family Medicine

## 2017-04-01 ENCOUNTER — Encounter: Payer: Self-pay | Admitting: Family Medicine

## 2017-04-01 MED ORDER — OXYCODONE HCL 10 MG PO TABS: 10.0000 mg | ORAL_TABLET | Freq: Four times a day (QID) | ORAL | 0 refills | Status: DC

## 2017-04-01 NOTE — Telephone Encounter (Signed)
This is a duplicate request

## 2017-04-01 NOTE — Telephone Encounter (Signed)
done

## 2017-04-01 NOTE — Telephone Encounter (Signed)
Can we refill these? 

## 2017-04-14 DIAGNOSIS — M47817 Spondylosis without myelopathy or radiculopathy, lumbosacral region: Secondary | ICD-10-CM | POA: Diagnosis not present

## 2017-04-14 DIAGNOSIS — M4317 Spondylolisthesis, lumbosacral region: Secondary | ICD-10-CM | POA: Diagnosis not present

## 2017-04-14 DIAGNOSIS — Z6832 Body mass index (BMI) 32.0-32.9, adult: Secondary | ICD-10-CM | POA: Diagnosis not present

## 2017-04-27 ENCOUNTER — Other Ambulatory Visit: Payer: Self-pay | Admitting: Family Medicine

## 2017-04-28 ENCOUNTER — Encounter: Payer: Self-pay | Admitting: Family Medicine

## 2017-04-28 ENCOUNTER — Other Ambulatory Visit: Payer: Self-pay | Admitting: Family Medicine

## 2017-04-29 MED ORDER — OXYCODONE HCL 10 MG PO TABS: 10.0000 mg | ORAL_TABLET | Freq: Four times a day (QID) | ORAL | 0 refills | Status: DC

## 2017-04-29 NOTE — Telephone Encounter (Signed)
Script is ready for pick up here at front office and left a voice message for pt.

## 2017-04-29 NOTE — Telephone Encounter (Signed)
In Dr Sarajane Jews absence  Can refill  # 56 of oxycodone ( 2 weeks supply)  Further refills throught Dr Sarajane Jews.

## 2017-04-30 ENCOUNTER — Other Ambulatory Visit: Payer: Self-pay | Admitting: Family Medicine

## 2017-05-02 DIAGNOSIS — M47817 Spondylosis without myelopathy or radiculopathy, lumbosacral region: Secondary | ICD-10-CM | POA: Diagnosis not present

## 2017-05-05 ENCOUNTER — Encounter: Payer: Self-pay | Admitting: Family Medicine

## 2017-05-05 MED ORDER — OXYCODONE HCL 10 MG PO TABS: 10.0000 mg | ORAL_TABLET | Freq: Four times a day (QID) | ORAL | 0 refills | Status: DC

## 2017-05-05 NOTE — Telephone Encounter (Signed)
I wrote a new rx for #120 as usual. Have him bring in the other one when he comes in for [place] to shred

## 2017-05-13 DIAGNOSIS — C44629 Squamous cell carcinoma of skin of left upper limb, including shoulder: Secondary | ICD-10-CM | POA: Diagnosis not present

## 2017-05-13 DIAGNOSIS — D485 Neoplasm of uncertain behavior of skin: Secondary | ICD-10-CM | POA: Diagnosis not present

## 2017-05-13 DIAGNOSIS — L570 Actinic keratosis: Secondary | ICD-10-CM | POA: Diagnosis not present

## 2017-05-30 ENCOUNTER — Other Ambulatory Visit: Payer: Self-pay | Admitting: Family Medicine

## 2017-06-05 DIAGNOSIS — C44629 Squamous cell carcinoma of skin of left upper limb, including shoulder: Secondary | ICD-10-CM | POA: Diagnosis not present

## 2017-06-05 DIAGNOSIS — D485 Neoplasm of uncertain behavior of skin: Secondary | ICD-10-CM | POA: Diagnosis not present

## 2017-06-10 ENCOUNTER — Encounter (HOSPITAL_COMMUNITY): Payer: Self-pay | Admitting: Emergency Medicine

## 2017-06-10 ENCOUNTER — Emergency Department (HOSPITAL_COMMUNITY): Payer: Medicare Other

## 2017-06-10 ENCOUNTER — Emergency Department (HOSPITAL_COMMUNITY)
Admission: EM | Admit: 2017-06-10 | Discharge: 2017-06-10 | Disposition: A | Discharge status: Home / Self Care | Payer: Medicare Other | Attending: Physician Assistant | Admitting: Physician Assistant

## 2017-06-10 DIAGNOSIS — M10022 Idiopathic gout, left elbow: Secondary | ICD-10-CM

## 2017-06-10 DIAGNOSIS — Z8546 Personal history of malignant neoplasm of prostate: Secondary | ICD-10-CM | POA: Diagnosis not present

## 2017-06-10 DIAGNOSIS — Z79899 Other long term (current) drug therapy: Secondary | ICD-10-CM | POA: Insufficient documentation

## 2017-06-10 DIAGNOSIS — I10 Essential (primary) hypertension: Secondary | ICD-10-CM | POA: Diagnosis not present

## 2017-06-10 DIAGNOSIS — R7989 Other specified abnormal findings of blood chemistry: Secondary | ICD-10-CM | POA: Diagnosis not present

## 2017-06-10 DIAGNOSIS — F329 Major depressive disorder, single episode, unspecified: Secondary | ICD-10-CM | POA: Diagnosis not present

## 2017-06-10 DIAGNOSIS — M25522 Pain in left elbow: Secondary | ICD-10-CM | POA: Diagnosis present

## 2017-06-10 DIAGNOSIS — R9431 Abnormal electrocardiogram [ECG] [EKG]: Secondary | ICD-10-CM | POA: Diagnosis not present

## 2017-06-10 DIAGNOSIS — R079 Chest pain, unspecified: Secondary | ICD-10-CM | POA: Diagnosis not present

## 2017-06-10 LAB — CBC
HEMATOCRIT: 39.8 % (ref 39.0–52.0)
HEMOGLOBIN: 13.5 g/dL (ref 13.0–17.0)
MCH: 31.3 pg (ref 26.0–34.0)
MCHC: 33.9 g/dL (ref 30.0–36.0)
MCV: 92.1 fL (ref 78.0–100.0)
Platelets: 202 10*3/uL (ref 150–400)
RBC: 4.32 MIL/uL (ref 4.22–5.81)
RDW: 14.4 % (ref 11.5–15.5)
WBC: 9.4 10*3/uL (ref 4.0–10.5)

## 2017-06-10 LAB — BASIC METABOLIC PANEL
ANION GAP: 12 (ref 5–15)
BUN: 14 mg/dL (ref 6–20)
CHLORIDE: 99 mmol/L — AB (ref 101–111)
CO2: 25 mmol/L (ref 22–32)
Calcium: 9.5 mg/dL (ref 8.9–10.3)
Creatinine, Ser: 1.13 mg/dL (ref 0.61–1.24)
GFR calc non Af Amer: >60 mL/min (ref >60)
Glucose, Bld: 131 mg/dL — ABNORMAL HIGH (ref 65–99)
POTASSIUM: 3.9 mmol/L (ref 3.5–5.1)
Sodium: 136 mmol/L (ref 135–145)

## 2017-06-10 LAB — I-STAT TROPONIN, ED: TROPONIN I, POC: 0.01 ng/mL (ref 0.00–0.08)

## 2017-06-10 LAB — D-DIMER, QUANTITATIVE: D-Dimer, Quant: 1.65 ug/mL-FEU — ABNORMAL HIGH (ref 0.00–0.50)

## 2017-06-10 MED ORDER — INDOMETHACIN 25 MG PO CAPS: 25.0000 mg | ORAL_CAPSULE | Freq: Three times a day (TID) | ORAL | 0 refills | Status: DC | PRN

## 2017-06-10 MED ORDER — IOPAMIDOL (ISOVUE-370) INJECTION 76%
INTRAVENOUS | Status: AC
  Administered 2017-06-10: 100 mL
  Filled 2017-06-10: qty 100

## 2017-06-10 MED ORDER — PREDNISONE 20 MG PO TABS: ORAL_TABLET | ORAL | 0 refills | Status: DC

## 2017-06-10 MED ORDER — OXYCODONE-ACETAMINOPHEN 5-325 MG PO TABS
1.0000 | ORAL_TABLET | Freq: Once | ORAL | Status: AC
  Administered 2017-06-10: 1 via ORAL
  Filled 2017-06-10: qty 1

## 2017-06-10 MED ORDER — CEPHALEXIN 500 MG PO CAPS: 500.0000 mg | ORAL_CAPSULE | Freq: Four times a day (QID) | ORAL | 0 refills | Status: DC

## 2017-06-10 MED ORDER — INDOMETHACIN 25 MG PO CAPS
50.0000 mg | ORAL_CAPSULE | Freq: Once | ORAL | Status: AC
  Administered 2017-06-10: 50 mg via ORAL
  Filled 2017-06-10: qty 2

## 2017-06-10 MED ORDER — CEPHALEXIN 250 MG PO CAPS
500.0000 mg | ORAL_CAPSULE | Freq: Once | ORAL | Status: AC
  Administered 2017-06-10: 500 mg via ORAL
  Filled 2017-06-10: qty 2

## 2017-06-10 NOTE — ED Provider Notes (Signed)
[facility] DEPT Provider Note   CSN: [id number] Arrival date & time: 06/10/17  1429     History   Chief Complaint Chief Complaint  Patient presents with  . Chest Pain    HPI Ricky Meyers is a 71 y.o. male.  HPI   Patient is a 71 year old male presenting with several complaints. Patient has pain in his left elbow. Patient has history of gout and this feels similar. Patient has swelling and pain in the left elbow after eating lots of shellfish and red meat.  Patient additionally had 2 biopsies on his left arm. This was done several days ago and now there is mild erythema surrounding them.  Patient also states that last night when he was going to bed he had some discomfort in his chest. He told  himself "if this happens again i'll tell mly wife" . And then he woke up the next morning. Patient hasn't had any chest pain since. He reports that occasionally on walking long distances he's been getting more short of breath and dizzy than usual.    Past Medical History:  Diagnosis Date  . Arthritis    neck and back   . Chronic neck pain   . Depression   . ED (erectile dysfunction)   . GERD (gastroesophageal reflux disease)    dysphagia  . Gout    sees Dr. Leigh Aurora   . Hyperlipidemia   . Hypertension   . Prostate cancer [facility])    history of  . Sleep apnea    snoring/never checked    Patient Active Problem List   Diagnosis Date Noted  . LBBB (left bundle branch block) 10/22/2016  . Dyspnea on exertion 10/22/2016  . Ectopic beat, ventricular 10/22/2016  . Anemia, iron deficiency 12/03/2014  . Fever 12/02/2014  . Pseudogout of elbow 12/02/2014  . Arthritis 12/02/2014  . Hyponatremia 12/02/2014  . Constipation 12/02/2014  . Suspected Septic arthritis 12/02/2014  . Nontraumatic pain and swelling of elbow   . ACUTE SINUSITIS, UNSPECIFIED 10/01/2010  . GROIN PAIN 10/01/2010  . ACUTE BRONCHITIS 08/30/2008  . Depression 03/04/2008  . SCARLET FEVER 06/30/2007  .  Hyperlipemia 06/30/2007  . Gout 06/30/2007  . Essential hypertension 06/30/2007  . NECK PAIN 06/30/2007  . PROSTATE CANCER, HX OF 06/30/2007    Past Surgical History:  Procedure Laterality Date  . CERVICAL FUSION  12/02/07   per Dr. Sherley Bounds  . COLONOSCOPY  12/22/06   per Dr. Ardis Hughs, repeat in 10 yrs  . injection lower back Right 09/17/2016  . PROSTATECTOMY    . TONSILLECTOMY    . VASECTOMY         Home Medications    Prior to Admission medications   Medication Sig Start Date End Date Taking? Authorizing Provider  allopurinol (ZYLOPRIM) 300 MG tablet TAKE ONE TABLET BY MOUTH ONCE DAILY 04/30/17   [name], MD  amLODipine (NORVASC) 5 MG tablet TAKE ONE TABLET BY MOUTH ONCE DAILY 04/01/17   [name], MD  atorvastatin (LIPITOR) 40 MG tablet TAKE ONE TABLET BY MOUTH ONCE DAILY 04/30/17   [name], MD  cephALEXin (KEFLEX) 500 MG capsule Take 1 capsule (500 mg total) by mouth 4 (four) times daily. 06/10/17   Jobina Maita Lyn, MD  cyclobenzaprine (FLEXERIL) 10 MG tablet TAKE ONE TABLET BY MOUTH THREE TIMES DAILY AS NEEDED FOR MUSCLE SPASMS 04/28/17   [name], MD  gabapentin (NEURONTIN) 100 MG capsule TAKE ONE CAPSULE BY MOUTH TWICE DAILY  06/02/17   [name], MD  indomethacin (INDOCIN) 25 MG capsule Take 1 capsule (25 mg total) by mouth 3 (three) times daily as needed. 06/10/17   Dacen Frayre Lyn, MD  indomethacin (INDOCIN) 50 MG capsule Take 1 capsule (50 mg total) by mouth 3 (three) times daily with meals. 03/07/16   [name], MD  lisinopril-hydrochlorothiazide (PRINZIDE,ZESTORETIC) 20-25 MG tablet TAKE ONE TABLET BY MOUTH ONCE DAILY 08/23/16   [name], MD  Oxycodone HCl 10 MG TABS Take 1 tablet (10 mg total) by mouth every 6 (six) hours. 05/05/17   [name], MD  pantoprazole (PROTONIX) 40 MG tablet Take 1 tablet (40 mg total) by mouth at bedtime. 03/07/16   [name], MD  PARoxetine (PAXIL) 20 MG tablet TAKE ONE TABLET BY MOUTH IN  THE MORNING 04/01/17   [name], MD  predniSONE (DELTASONE) 20 MG tablet Day 1 and 2: Take 3 tabs  Day 3-5: Take 2 tabs.  Day 5-8: take 1 tab 06/10/17   Kirrah Mustin Lyn, MD  psyllium (METAMUCIL) 58.6 % packet Take 1 packet by mouth daily as needed (for constipation).     [provider]    Family History Family History  Problem Relation Age of Onset  . Cancer Other        breast, porstate  . Hypertension Other   . Stroke Other   . Breast cancer Mother   . Diverticulitis Mother   . Heart disease Father   . Prostate cancer Father   . Stroke Father   . Dementia Sister   . Colon cancer Neg Hx     Social History Social History  Substance Use Topics  . Smoking status: Never Smoker  . Smokeless tobacco: Never Used  . Alcohol use 0.0 oz/week     Comment: occ     Allergies   Patient has no known allergies.   Review of Systems Review of Systems  Constitutional: Negative for activity change.  Respiratory: Negative for shortness of breath.   Cardiovascular: Negative for chest pain.  Gastrointestinal: Negative for abdominal pain.     Physical Exam Updated Vital Signs BP 132/81   Pulse 77   Temp 98 F (36.7 C) (Oral)   Resp 16   SpO2 96%   Physical Exam  Constitutional: He is oriented to person, place, and time. He appears well-nourished.  HENT:  Head: Normocephalic and atraumatic.  Eyes: Conjunctivae are normal.  Cardiovascular: Normal rate and regular rhythm.   Pulmonary/Chest: Effort normal and breath sounds normal. No respiratory distress. He has no wheezes.  Abdominal: Soft. He exhibits no distension.  Musculoskeletal:  Left elbow with pain with movement. No erythema or warmth. Consistent with gout.  Left forearm has mild erythema surrounding the 2 biopsy sites. There is no streaking. The warmth and redness does not coalesce with the elbow.  Neurological: He is oriented to person, place, and time.  Skin: Skin is warm and dry. He is not  diaphoretic.  Psychiatric: He has a normal mood and affect. His behavior is normal.     ED Treatments / Results  Labs (all labs ordered are listed, but only abnormal results are displayed) Labs Reviewed  BASIC METABOLIC PANEL - Abnormal; Notable for the following:       Result Value   Chloride 99 (*)    Glucose, Bld 131 (*)    All other components within normal limits  D-DIMER, QUANTITATIVE (NOT AT [facility]) - Abnormal; Notable  for the following:    D-Dimer, Quant 1.65 (*)    All other components within normal limits  CBC  I-STAT TROPONIN, ED    EKG  EKG Interpretation  Date/Time:  Tuesday June 10 2017 14:39:16 EDT Ventricular Rate:  78 PR Interval:  172 QRS Duration: 132 QT Interval:  414 QTC Calculation: 471 R Axis:   -11 Text Interpretation:  pvcs.  Confirmed by Zenovia Jarred [phone]) on 06/10/2017 10:31:29 PM       Radiology Dg Chest 2 View  Result Date: 06/10/2017 CLINICAL DATA:  71 year old male with intense chest pain radiating to the left arm since last night. EXAM: CHEST  2 VIEW COMPARISON:  08/18/2014. FINDINGS: Mediastinal contours are stable since 2015 and within normal limits. Visualized tracheal air column is within normal limits. Stable lung volumes. Minimal to mild left lower lobe scarring. The lungs are otherwise clear. No pneumothorax, pulmonary edema, pleural effusion or acute pulmonary opacity. Osteopenia. Mild lower thoracic spine compression is chronic. Previous cervical ACDF. No acute osseous abnormality identified. Negative visible bowel gas pattern. IMPRESSION: No acute cardiopulmonary abnormality. Electronically Signed   By: Genevie Ann M.D.   On: 06/10/2017 16:52   Ct Angio Chest Pe W And/or Wo Contrast  Result Date: 06/10/2017 CLINICAL DATA:  Chest pain and positive D-dimer examination. History of prostate carcinoma EXAM: CT ANGIOGRAPHY CHEST WITH CONTRAST TECHNIQUE: Multidetector CT imaging of the chest was performed using the standard protocol during  bolus administration of intravenous contrast. Multiplanar CT image reconstructions and MIPs were obtained to evaluate the vascular anatomy. CONTRAST:  100 mL Isovue 370 nonionic COMPARISON:  Chest radiograph June 10, 2017 FINDINGS: Cardiovascular: There is no demonstrable pulmonary embolus. There is no thoracic aortic aneurysm or dissection. There is mild calcification at the origins of the left common and left subclavian arteries. Other visualized great vessels appear unremarkable. There are foci of atherosclerotic calcification in the aorta. There are scattered foci of coronary artery calcification. Pericardium is not appreciably thickened. Mediastinum/Nodes: Visualized thyroid appears unremarkable. There are scattered subcentimeter mediastinal lymph nodes. There is no adenopathy by size criteria. There is moderate anterior mediastinal fat. Lungs/Pleura: There are scattered areas of patchy atelectatic change. There is no parenchymal lung edema or consolidation. No appreciable pleural effusion or pleural thickening. Upper Abdomen: There is incomplete visualization of a 1 cm cyst in the anterior segment right lobe of the liver. Visualized upper abdominal structures otherwise appear unremarkable. Musculoskeletal: There are no blastic or lytic bone lesions evident. Review of the MIP images confirms the above findings. IMPRESSION: 1.  No demonstrable pulmonary embolus. 2. No thoracic aortic aneurysm or dissection. Foci of atherosclerotic calcification in the aorta and great vessels. There are foci of coronary artery calcification. 3. No parenchymal lung edema or consolidation. Areas of patchy atelectasis bilaterally. 4.  No appreciable adenopathy. Aortic Atherosclerosis (ICD10-I70.0). Electronically Signed   By: Lowella Grip III M.D.   On: 06/10/2017 21:33    Procedures Procedures (including critical care time)  Medications Ordered in ED Medications  cephALEXin (KEFLEX) capsule 500 mg (500 mg Oral Given  06/10/17 1923)  indomethacin (INDOCIN) capsule 50 mg (50 mg Oral Given 06/10/17 1923)  iopamidol (ISOVUE-370) 76 % injection (100 mLs  Contrast Given 06/10/17 2101)  oxyCODONE-acetaminophen (PERCOCET/ROXICET) 5-325 MG per tablet 1 tablet (1 tablet Oral Given 06/10/17 2228)     Initial Impression / Assessment and Plan / ED Course  I have reviewed the triage vital signs and the nursing notes.  Pertinent labs & imaging  results that were available during my care of the patient were reviewed by me and considered in my medical decision making (see chart for details).     Patient is a very pleasant 71 year old male presenting with 3 issues.   1. He's mild erythema around the biopsy sites. We'll give Keflex.   2. He has pain in his left elbow. I believe this is gout.Doubt septic arthritis.  Patient had gotten gout int his elbow before and it feels similarly. We will give him indomethacin. If this is not effective we'll give prednsione  taper to start. Patient does not have diabetes.  3. Patient's had one episode of chest pain last night. Initial troponin is negative and it has been enough time ithat it would be positive with sig ischemia. Patient does have shortness of breath upon walking long distances. And he will follow-up with his primary care physician. We will also send a d-dimer. D-dimer positive. CT imaging negative.   Final Clinical Impressions(s) / ED Diagnoses   Final diagnoses:  Acute idiopathic gout of left elbow    New Prescriptions New Prescriptions   CEPHALEXIN (KEFLEX) 500 MG CAPSULE    Take 1 capsule (500 mg total) by mouth 4 (four) times daily.   INDOMETHACIN (INDOCIN) 25 MG CAPSULE    Take 1 capsule (25 mg total) by mouth 3 (three) times daily as needed.   PREDNISONE (DELTASONE) 20 MG TABLET    Day 1 and 2: Take 3 tabs  Day 3-5: Take 2 tabs.  Day 5-8: take 1 tab     Macarthur Critchley, MD 06/10/17 2238

## 2017-06-10 NOTE — Discharge Instructions (Signed)
°  Follow-up with her cardiologist for the shortness of breath over the walking long distances. Your elbow is gout and we will treat with indomethacin. We additionally give you a prescription for prednisone if it does not get better within the next several days.You also has slight redness surrounding a recent biopsies on yourleft arm and for this we are going to continue Keflex. Please return with any changes that are concerning.

## 2017-06-10 NOTE — ED Notes (Signed)
Pt was given food and drink per MD

## 2017-06-10 NOTE — ED Notes (Signed)
Pt understood dc material. NAD Noted. Scripts given at dc. 

## 2017-06-10 NOTE — ED Triage Notes (Signed)
Pt states when he laid down last night he had intense chest pain that went into his left arm. This am his left arm was still sore and painful to move. Pt denies any chest pain at this time.

## 2017-06-11 ENCOUNTER — Encounter: Payer: Self-pay | Admitting: Family Medicine

## 2017-06-11 MED ORDER — OXYCODONE HCL 10 MG PO TABS: 10.0000 mg | ORAL_TABLET | Freq: Four times a day (QID) | ORAL | 0 refills | Status: DC

## 2017-06-11 NOTE — Telephone Encounter (Signed)
Done

## 2017-06-12 ENCOUNTER — Encounter: Payer: Self-pay | Admitting: Family Medicine

## 2017-06-12 NOTE — Telephone Encounter (Signed)
Duplicate note

## 2017-06-17 ENCOUNTER — Ambulatory Visit (INDEPENDENT_AMBULATORY_CARE_PROVIDER_SITE_OTHER): Payer: Medicare Other | Admitting: Physician Assistant

## 2017-06-17 ENCOUNTER — Encounter: Payer: Self-pay | Admitting: Physician Assistant

## 2017-06-17 VITALS — BP 112/74 | HR 74 | Ht 72.0 in | Wt 250.6 lb

## 2017-06-17 DIAGNOSIS — E785 Hyperlipidemia, unspecified: Secondary | ICD-10-CM

## 2017-06-17 DIAGNOSIS — R0609 Other forms of dyspnea: Secondary | ICD-10-CM | POA: Diagnosis not present

## 2017-06-17 DIAGNOSIS — R0683 Snoring: Secondary | ICD-10-CM | POA: Diagnosis not present

## 2017-06-17 DIAGNOSIS — E669 Obesity, unspecified: Secondary | ICD-10-CM

## 2017-06-17 DIAGNOSIS — R079 Chest pain, unspecified: Secondary | ICD-10-CM | POA: Diagnosis not present

## 2017-06-17 DIAGNOSIS — I10 Essential (primary) hypertension: Secondary | ICD-10-CM

## 2017-06-17 DIAGNOSIS — I447 Left bundle-branch block, unspecified: Secondary | ICD-10-CM

## 2017-06-17 NOTE — Patient Instructions (Addendum)
Medication Instructions:  Your physician recommends that you continue on your current medications as directed. Please refer to the Current Medication list given to you today.   Labwork: None Ordered   Testing/Procedures: Your physician has requested that you have an echocardiogram. Echocardiography is a painless test that uses sound waves to create images of your heart. It provides your doctor with information about the size and shape of your heart and how well your heart's chambers and valves are working. This procedure takes approximately one hour. There are no restrictions for this procedure.  Your physician has requested that you have a lexiscan myoview. For further information please visit [email or web address]. Please follow instruction sheet, as given.    Follow-Up: Your physician recommends that you schedule a follow-up appointment in: 1-2 months with Dr. Irish Lack.   Any Other Special Instructions Will Be Listed Below (If Applicable).     If you need a refill on your cardiac medications before your next appointment, please call your pharmacy.

## 2017-06-17 NOTE — Progress Notes (Signed)
Cardiology Office Note    Date:  06/17/2017   ID:  Ricky Meyers Ricky Meyers, MRN [id number]  PCP:  Ricky Morale, MD  Cardiologist: Ricky Meyers  Chief Complaint  Patient presents with  . Follow-up    ER visit    History of Present Illness:  Ricky Meyers is a 71 y.o. male who has had an irregular heart beat, HTN, HLD.  He was not allowed to give blood because of the irregularity.  He has been more winded when walking up stairs or to the mailbox.  ECG done at [facility] on 10/22/16 showed NSR with PVCs.   He saw a cardiologist a few years ago.  He had a nuclear stress test- about 10-15 years ago prior to a surgery.  A bundle branch block was noted at that time.    Last saw Ricky Meyers 10/2016 after being turned down at blood donation for irregular heart beat. He has PVCs which is asymptomatic with. No evidence of A. fib. Left bundle branch block is old. No further medications were recommended. Blood pressure was elevated and exercise was recommended and decrease salt intake.  Patient went to the emergency room 06/10/17 with complaints of left elbow pain similar to his gout. He also had some chest discomfort the night before. He also had occasional shortness of breath and dizziness with walking long distances ER felt he had gout gave him indomethacin. He was also given Keflex for mild erythema around biopsy sites on his arm. Troponin was negative. D-dimer was 1.65 CT scans showed no pulmonary embolus, foci of atherosclerotic calcification in the aorta and great vessels. EKG normal sinus rhythm with PVCs and left bundle branch block.  Patient comes in today accompanied by his wife. Patient states that when he went to bed he had some sharp shooting chest pain that eased with deep breathing. He always eats late and goes to bed shortly after. Past couple of weeks he has had some dizziness when he gets up after an hour long car ride.This has resolved and he hasn't had it recently.Also more  dyspnea on exertion and diaphoresis with exertion. Has spinal stenosis and limits his exercise. SOB walking 30 yards to the mailbox. Parents died of CVA's. GF and uncles with MI's Never smoked not diabetic. Patient also snores a lot and would like to be tested for sleep apnea.       Past Medical History:  Diagnosis Date  . Arthritis    neck and back   . Chronic neck pain   . Depression   . ED (erectile dysfunction)   . GERD (gastroesophageal reflux disease)    dysphagia  . Gout    sees Dr. Leigh Meyers   . Hyperlipidemia   . Hypertension   . Prostate cancer [facility])    history of  . Sleep apnea    snoring/never checked    Past Surgical History:  Procedure Laterality Date  . CERVICAL FUSION  12/02/07   per Dr. Sherley Bounds  . COLONOSCOPY  12/22/06   per Dr. Ardis Hughs, repeat in 10 yrs  . injection lower back Right 09/17/2016  . PROSTATECTOMY    . TONSILLECTOMY    . VASECTOMY      Current Medications: Current Meds  Medication Sig  . allopurinol (ZYLOPRIM) 300 MG tablet TAKE ONE TABLET BY MOUTH ONCE DAILY  . amLODipine (NORVASC) 5 MG tablet TAKE ONE TABLET BY MOUTH ONCE DAILY  . atorvastatin (LIPITOR) 40 MG tablet TAKE ONE  TABLET BY MOUTH ONCE DAILY  . cephALEXin (KEFLEX) 500 MG capsule Take 1 capsule (500 mg total) by mouth 4 (four) times daily.  . cyclobenzaprine (FLEXERIL) 10 MG tablet TAKE ONE TABLET BY MOUTH THREE TIMES DAILY AS NEEDED FOR MUSCLE SPASMS  . gabapentin (NEURONTIN) 100 MG capsule TAKE ONE CAPSULE BY MOUTH TWICE DAILY  . indomethacin (INDOCIN) 25 MG capsule Take 25 mg by mouth 3 (three) times daily as needed (pain).  . indomethacin (INDOCIN) 50 MG capsule Take 1 capsule (50 mg total) by mouth 3 (three) times daily with meals.  Marland Kitchen lisinopril-hydrochlorothiazide (PRINZIDE,ZESTORETIC) 20-25 MG tablet TAKE ONE TABLET BY MOUTH ONCE DAILY  . Oxycodone HCl 10 MG TABS Take 1 tablet (10 mg total) by mouth every 6 (six) hours.  . pantoprazole (PROTONIX) 40 MG tablet Take  1 tablet (40 mg total) by mouth at bedtime.  Marland Kitchen PARoxetine (PAXIL) 20 MG tablet TAKE ONE TABLET BY MOUTH IN THE MORNING  . predniSONE (DELTASONE) 20 MG tablet Take 20 mg by mouth as directed.  . psyllium (METAMUCIL) 58.6 % packet Take 1 packet by mouth daily as needed (for constipation).    Current Facility-Administered Medications for the 06/17/17 encounter (Office Visit) with Ricky Burn, PA-C  Medication  . 0.9 %  sodium chloride infusion  . 0.9 %  sodium chloride infusion     Allergies:   Patient has no known allergies.   Social History   Social History  . Marital status: Married    Spouse name: N/A  . Number of children: N/A  . Years of education: N/A   Social History Main Topics  . Smoking status: Never Smoker  . Smokeless tobacco: Never Used  . Alcohol use 0.0 oz/week     Comment: occ  . Drug use: No  . Sexual activity: Not Asked   Other Topics Concern  . None   Social History Narrative  . None     Family History:  [name] family history includes Breast cancer in his mother; Cancer in his other; Dementia in his sister; Diverticulitis in his mother; Heart disease in his father; Hypertension in his other; Prostate cancer in his father; Stroke in his father and other.   ROS:   Please see the history of present illness.    Review of Systems  Constitution: Negative.  HENT: Negative.   Cardiovascular: Positive for chest pain and dyspnea on exertion.  Respiratory: Positive for snoring.   Endocrine: Negative.   Hematologic/Lymphatic: Bruises/bleeds easily.  Musculoskeletal: Positive for back pain, myalgias and stiffness.  Gastrointestinal: Negative.   Genitourinary: Negative.   Neurological: Positive for dizziness.   All other systems reviewed and are negative.   PHYSICAL EXAM:   VS:  BP 112/74   Pulse 74   Ht 6' (1.829 m)   Wt 250 lb 9.6 oz (113.7 kg)   BMI 33.99 kg/m   Physical Exam  GEN: Obese, in no acute distress  Neck: no JVD, carotid  bruits, or masses Cardiac:RRR; 1/6 systolic murmur, midsystolic click at the apex, positive S4 Respiratory:  Decreased breath sounds but clear to auscultation bilaterally, normal work of breathing GI: soft, nontender, nondistended, + BS Ext: without cyanosis, clubbing, or edema, Good distal pulses bilaterally Neuro:  Alert and Oriented x 3 Psych: euthymic mood, full affect  Wt Readings from Last 3 Encounters:  06/17/17 250 lb 9.6 oz (113.7 kg)  03/11/17 254 lb (115.2 kg)  02/27/17 252 lb (114.3 kg)      Studies/Labs Reviewed:  EKG:  EKG is not ordered today.  The ekg reviewed from the emergency room normal sinus rhythm with PVCs and left bundle branch block, no acute change  Recent Labs: 06/10/2017: BUN 14; Creatinine, Ser 1.13; Hemoglobin 13.5; Platelets 202; Potassium 3.9; Sodium 136   Lipid Panel    Component Value Date/Time   CHOL 148 03/07/2016 1051   TRIG 53.0 03/07/2016 1051   HDL 63.80 03/07/2016 1051   CHOLHDL 2 03/07/2016 1051   VLDL 10.6 03/07/2016 1051   LDLCALC 74 03/07/2016 1051   LDLDIRECT 161.4 09/29/2012 0811    Additional studies/ records that were reviewed today include:   IMPRESSION: 1.  No demonstrable pulmonary embolus.   2. No thoracic aortic aneurysm or dissection. Foci of atherosclerotic calcification in the aorta and great vessels. There are foci of coronary artery calcification.   3. No parenchymal lung edema or consolidation. Areas of patchy atelectasis bilaterally.   4.  No appreciable adenopathy.      ASSESSMENT:    1. DOE (dyspnea on exertion)   2. Chest pain, unspecified type   3. Essential hypertension   4. LBBB (left bundle branch block)   5. Hyperlipidemia, unspecified hyperlipidemia type   6. Obesity (BMI 30.0-34.9)   7. Snoring      PLAN:  In order of problems listed above:   Dyspnea on exertion which seems to have worsened over the past several months. Also had episode of atypical chest pain. Multiple cardiac risk  factors for CAD and atherosclerosic calcification on CT scan. We'll order Lexi scan Myoview to rule out ischemia. Weight may definitely be contributing to his dyspnea. Weight loss discussed with patient.  Chest pain sharp shooting after laying down after a big meal. Suspect GI in origin. Troponins negative and EKG unchanged. Doing Lexi scan because of dyspnea on exertion. Asked that he eat earlier in the evening and not to lay down for 2 hours after meals. F/U with Ricky Meyers in 2 months.  Essential hypertension controlled. Patient was having some dizziness but this seems to have resolved. Recommend staying hydrated.  LBBB chronic  Hyperlipidemia on Lipitor and most recent lipid panel 03/2016 look good  Obesity weight loss program discussed with patient.  Snoring will refer patient for sleep apnea test    Medication Adjustments/Labs and Tests Ordered: Current medicines are reviewed at length with the patient today.  Concerns regarding medicines are outlined above.  Medication changes, Labs and Tests ordered today are listed in the Patient Instructions below. Patient Instructions  Medication Instructions:  Your physician recommends that you continue on your current medications as directed. Please refer to the Current Medication list given to you today.   Labwork: None Ordered   Testing/Procedures: Your physician has requested that you have an echocardiogram. Echocardiography is a painless test that uses sound waves to create images of your heart. It provides your doctor with information about the size and shape of your heart and how well your heart's chambers and valves are working. This procedure takes approximately one hour. There are no restrictions for this procedure.  Your physician has requested that you have a lexiscan myoview. For further information please visit [email or web address]. Please follow instruction sheet, as given.    Follow-Up: Your physician recommends that you  schedule a follow-up appointment in: 1-2 months with Ricky Meyers.   Any Other Special Instructions Will Be Listed Below (If Applicable).     If you need a refill on your cardiac medications before your next appointment, please  call your pharmacy.      Signed, Ermalinda Barrios, PA-C  06/17/2017 2:36 PM    [facility] [place], [place], [place]  [address] Phone: [phone]; Fax: [phone]

## 2017-06-23 DIAGNOSIS — L821 Other seborrheic keratosis: Secondary | ICD-10-CM | POA: Diagnosis not present

## 2017-06-25 ENCOUNTER — Telehealth (HOSPITAL_COMMUNITY): Payer: Self-pay | Admitting: *Deleted

## 2017-06-25 NOTE — Telephone Encounter (Signed)
Patient given detailed instructions per Myocardial Perfusion Study Information Sheet for the test on  06/26/17. Patient notified to arrive 15 minutes early and that it is imperative to arrive on time for appointment to keep from having the test rescheduled.  If you need to cancel or reschedule your appointment, please call the office within 24 hours of your appointment. . Patient verbalized understanding. Kirstie Peri

## 2017-06-26 ENCOUNTER — Other Ambulatory Visit: Payer: Self-pay

## 2017-06-26 ENCOUNTER — Ambulatory Visit (HOSPITAL_COMMUNITY): Payer: Medicare Other | Attending: Internal Medicine

## 2017-06-26 ENCOUNTER — Ambulatory Visit (HOSPITAL_BASED_OUTPATIENT_CLINIC_OR_DEPARTMENT_OTHER): Payer: Medicare Other

## 2017-06-26 DIAGNOSIS — R42 Dizziness and giddiness: Secondary | ICD-10-CM | POA: Insufficient documentation

## 2017-06-26 DIAGNOSIS — R0609 Other forms of dyspnea: Secondary | ICD-10-CM

## 2017-06-26 DIAGNOSIS — I10 Essential (primary) hypertension: Secondary | ICD-10-CM | POA: Diagnosis not present

## 2017-06-26 DIAGNOSIS — R079 Chest pain, unspecified: Secondary | ICD-10-CM | POA: Insufficient documentation

## 2017-06-26 DIAGNOSIS — E669 Obesity, unspecified: Secondary | ICD-10-CM | POA: Insufficient documentation

## 2017-06-26 DIAGNOSIS — Z8546 Personal history of malignant neoplasm of prostate: Secondary | ICD-10-CM | POA: Diagnosis not present

## 2017-06-26 DIAGNOSIS — E785 Hyperlipidemia, unspecified: Secondary | ICD-10-CM | POA: Insufficient documentation

## 2017-06-26 DIAGNOSIS — I447 Left bundle-branch block, unspecified: Secondary | ICD-10-CM | POA: Insufficient documentation

## 2017-06-26 LAB — MYOCARDIAL PERFUSION IMAGING
CHL CUP NUCLEAR SRS: 5
CHL CUP RESTING HR STRESS: 81 {beats}/min
LV dias vol: 123 mL (ref 62–150)
LV sys vol: 69 mL
NUC STRESS TID: 1.14
Peak HR: 90 {beats}/min
RATE: 0.25
SDS: 2
SSS: 7

## 2017-06-26 LAB — ECHOCARDIOGRAM COMPLETE
Height: 72 in
WEIGHTICAEL: 4000 [oz_av]

## 2017-06-26 MED ORDER — REGADENOSON 0.4 MG/5ML IV SOLN
0.4000 mg | Freq: Once | INTRAVENOUS | Status: AC
  Administered 2017-06-26: 0.4 mg via INTRAVENOUS

## 2017-06-26 MED ORDER — PERFLUTREN LIPID MICROSPHERE
1.0000 mL | INTRAVENOUS | Status: AC | PRN
  Administered 2017-06-26: 2 mL via INTRAVENOUS

## 2017-06-26 MED ORDER — TECHNETIUM TC 99M TETROFOSMIN IV KIT
33.0000 | PACK | Freq: Once | INTRAVENOUS | Status: AC | PRN
  Administered 2017-06-26: 33 via INTRAVENOUS
  Filled 2017-06-26: qty 33

## 2017-06-26 MED ORDER — TECHNETIUM TC 99M TETROFOSMIN IV KIT
10.7000 | PACK | Freq: Once | INTRAVENOUS | Status: AC | PRN
  Administered 2017-06-26: 10.7 via INTRAVENOUS
  Filled 2017-06-26: qty 11

## 2017-06-30 NOTE — Progress Notes (Signed)
Cardiology Office Note   Date:  07/01/2017   ID:  Meyers, Ricky 30-May-1946, MRN [id number]  PCP:  [name], MD    No chief complaint on file.  DOE  [facility] Readings from Last 3 Encounters:  07/01/17 245 lb 12.8 oz (111.5 kg)  06/26/17 250 lb (113.4 kg)  06/17/17 250 lb 9.6 oz (113.7 kg)       History of Present Illness: Ricky Meyers is a 71 y.o. male  Who has had PVCs in the past.  He had some DOE  And was referred for nuclear stress test and echocardiogram.  No ischemia noted but he did have slightly decreased EF.    2018 echo showed: Technically difficult study. Definity contrast given. LVEF 45-50%   with anterior and anteroseptal hypokinesis to dyskinesis, mild   LVH, grade 1 DD, normal LV filling pressure, trivial MR, normal   LA size, normal IVC.  DOE improved since the testing.  Occasional lightheadedness with standing.  He has chronic back issues which haave limited his activity.  He admits to enjoying his couch quite a bit.    Past Medical History:  Diagnosis Date  . Arthritis    neck and back   . Chronic neck pain   . Depression   . ED (erectile dysfunction)   . GERD (gastroesophageal reflux disease)    dysphagia  . Gout    sees Dr. Leigh Aurora   . Hyperlipidemia   . Hypertension   . Prostate cancer [facility])    history of  . Sleep apnea    snoring/never checked    Past Surgical History:  Procedure Laterality Date  . CERVICAL FUSION  12/02/07   per Dr. Sherley Bounds  . COLONOSCOPY  12/22/06   per Dr. Ardis Hughs, repeat in 10 yrs  . injection lower back Right 09/17/2016  . PROSTATECTOMY    . TONSILLECTOMY    . VASECTOMY       Current Outpatient Prescriptions  Medication Sig Dispense Refill  . allopurinol (ZYLOPRIM) 300 MG tablet TAKE ONE TABLET BY MOUTH ONCE DAILY 30 tablet 6  . amLODipine (NORVASC) 5 MG tablet TAKE ONE TABLET BY MOUTH ONCE DAILY 90 tablet 3  . atorvastatin (LIPITOR) 40 MG tablet TAKE ONE TABLET BY MOUTH ONCE DAILY  90 tablet 3  . cyclobenzaprine (FLEXERIL) 10 MG tablet TAKE ONE TABLET BY MOUTH THREE TIMES DAILY AS NEEDED FOR MUSCLE SPASMS 90 tablet 1  . gabapentin (NEURONTIN) 100 MG capsule TAKE ONE CAPSULE BY MOUTH TWICE DAILY 180 capsule 1  . indomethacin (INDOCIN) 50 MG capsule Take 50 mg by mouth 3 (three) times daily as needed.    Marland Kitchen lisinopril-hydrochlorothiazide (PRINZIDE,ZESTORETIC) 20-25 MG tablet TAKE ONE TABLET BY MOUTH ONCE DAILY 90 tablet 3  . Oxycodone HCl 10 MG TABS Take 1 tablet (10 mg total) by mouth every 6 (six) hours. 120 tablet 0  . pantoprazole (PROTONIX) 40 MG tablet Take 1 tablet (40 mg total) by mouth at bedtime. 90 tablet 3  . PARoxetine (PAXIL) 20 MG tablet TAKE ONE TABLET BY MOUTH IN THE MORNING 90 tablet 3  . predniSONE (DELTASONE) 20 MG tablet Take 20 mg by mouth as directed.    . psyllium (METAMUCIL) 58.6 % packet Take 1 packet by mouth daily as needed (for constipation).     . indomethacin (INDOCIN) 25 MG capsule Take 25 mg by mouth 3 (three) times daily as needed (pain).     Current Facility-Administered Medications  Medication  Dose Route Frequency Provider Last Rate Last Dose  . 0.9 %  sodium chloride infusion  500 mL Intravenous Continuous Milus Banister, MD      . 0.9 %  sodium chloride infusion  500 mL Intravenous Continuous Milus Banister, MD        Allergies:   Patient has no known allergies.    Social History:  The patient  reports that he has never smoked. He has never used smokeless tobacco. He reports that he drinks alcohol. He reports that he does not use drugs.   Family History:  [name] family history includes Breast cancer in his mother; Cancer in his other; Dementia in his sister; Diverticulitis in his mother; Heart disease in his father; Hypertension in his other; Prostate cancer in his father; Stroke in his father and other.    ROS:  Please see the history of present illness.   Otherwise, review of systems are positive for fatigue.  All other  systems are reviewed and negative.    PHYSICAL EXAM: VS:  BP 110/76   Pulse 89   Ht 6' (1.829 m)   Wt 245 lb 12.8 oz (111.5 kg)   SpO2 96%   BMI 33.34 kg/m  , BMI Body mass index is 33.34 kg/m. GEN: Well nourished, well developed, in no acute distress  HEENT: normal  Neck: no JVD, carotid bruits, or masses Cardiac: RRR; no murmurs, rubs, or gallops,no edema  Respiratory:  clear to auscultation bilaterally, normal work of breathing GI: soft, nontender, nondistended, + BS, obese MS: no deformity or atrophy  Skin: warm and dry, no rash Neuro:  Strength and sensation are intact Psych: euthymic mood, full affect   EKG:   The most recent ekg ordered demonstrates NSR, LBBB, PVC   Recent Labs: 06/10/2017: BUN 14; Creatinine, Ser 1.13; Hemoglobin 13.5; Platelets 202; Potassium 3.9; Sodium 136   Lipid Panel    Component Value Date/Time   CHOL 148 03/07/2016 1051   TRIG 53.0 03/07/2016 1051   HDL 63.80 03/07/2016 1051   CHOLHDL 2 03/07/2016 1051   VLDL 10.6 03/07/2016 1051   LDLCALC 74 03/07/2016 1051   LDLDIRECT 161.4 09/29/2012 0811     Other studies Reviewed: Additional studies/ records that were reviewed today with results demonstrating: ehco and nuclear stress reviewed.   ASSESSMENT AND PLAN:  1. DOE: Likely due to deconditioning.  I encouraged him to be as active as possible.  2. LBBB: THis likely contributed to the wall motion abnormality noted on echo, due to septal dyssynchrony.  No ischemia by stress test.  He is certainly at risk for CAD, but nothing significant showed up on stress test.  3. Try to increase exercise. Mild orthostatic sx as well.  Stay hydrated and make position changes slowly and with some support.  Sx related tomeds for hypertension as well. Stay hydrated.  4. Hyperlipidemia: Lipids reviewed as above. COntinue atorvastatin.   Current medicines are reviewed at length with the patient today.  The patient concerns regarding his medicines were  addressed.  The following changes have been made:  No change  Labs/ tests ordered today include:  No orders of the defined types were placed in this encounter.   Recommend 150 minutes/week of aerobic exercise Low fat, low carb, high fiber diet recommended  Disposition:   FU in 1 year   Signed, Larae Grooms, MD  07/01/2017 3:35 PM    [facility] [place], [place],   [address]  Phone: [phone]; Fax: [phone]

## 2017-07-01 ENCOUNTER — Encounter: Payer: Self-pay | Admitting: Interventional Cardiology

## 2017-07-01 ENCOUNTER — Ambulatory Visit (INDEPENDENT_AMBULATORY_CARE_PROVIDER_SITE_OTHER): Payer: Medicare Other | Admitting: Interventional Cardiology

## 2017-07-01 VITALS — BP 110/76 | HR 89 | Ht 72.0 in | Wt 245.8 lb

## 2017-07-01 DIAGNOSIS — I447 Left bundle-branch block, unspecified: Secondary | ICD-10-CM | POA: Diagnosis not present

## 2017-07-01 DIAGNOSIS — E782 Mixed hyperlipidemia: Secondary | ICD-10-CM

## 2017-07-01 DIAGNOSIS — R0609 Other forms of dyspnea: Secondary | ICD-10-CM | POA: Diagnosis not present

## 2017-07-01 DIAGNOSIS — I10 Essential (primary) hypertension: Secondary | ICD-10-CM | POA: Diagnosis not present

## 2017-07-01 NOTE — Patient Instructions (Signed)

## 2017-07-03 ENCOUNTER — Telehealth: Payer: Self-pay | Admitting: *Deleted

## 2017-07-03 DIAGNOSIS — R0609 Other forms of dyspnea: Principal | ICD-10-CM

## 2017-07-03 NOTE — Telephone Encounter (Deleted)
Per Ricky Meyers split night study ordered.

## 2017-07-04 NOTE — Telephone Encounter (Signed)
Per Ermalinda Barrios split night study ordered. Informed patient of upcoming sleep study and patient understanding was verbalized. Patient understands his sleep study is scheduled for Sunday August 24 2017. Patient understands his sleep study will be done at [facility] sleep lab. Patient understands he will receive a sleep packet in a week or so. Patient understands to call if he does not receive the sleep packet in a timely manner. Patient agrees with treatment and thanked me for call.

## 2017-07-08 ENCOUNTER — Encounter: Payer: Self-pay | Admitting: Family Medicine

## 2017-07-08 MED ORDER — OXYCODONE HCL 10 MG PO TABS: 10.0000 mg | ORAL_TABLET | Freq: Four times a day (QID) | ORAL | 0 refills | Status: DC

## 2017-07-08 NOTE — Telephone Encounter (Signed)
Done

## 2017-07-09 ENCOUNTER — Encounter: Payer: Self-pay | Admitting: Family Medicine

## 2017-07-10 NOTE — Telephone Encounter (Signed)
This is a duplicate note.  

## 2017-07-14 ENCOUNTER — Encounter: Payer: Self-pay | Admitting: Family Medicine

## 2017-07-14 ENCOUNTER — Telehealth: Payer: Self-pay | Admitting: Family Medicine

## 2017-07-14 NOTE — Telephone Encounter (Signed)
[facility] called to request a new rx for pt cyclobenzaprine (FLEXERIL) 10 MG tablet  Pt has never gotten this filled here before and they cannot escribe unless its a refill.  [address] [place] - [place], [place] - [address]

## 2017-07-15 ENCOUNTER — Encounter: Payer: Self-pay | Admitting: Family Medicine

## 2017-07-15 NOTE — Telephone Encounter (Signed)
Call in a Medrol dose pack  

## 2017-07-16 ENCOUNTER — Encounter: Payer: Self-pay | Admitting: Family Medicine

## 2017-07-16 ENCOUNTER — Other Ambulatory Visit: Payer: Self-pay | Admitting: Family Medicine

## 2017-07-16 MED ORDER — METHYLPREDNISOLONE 4 MG PO TBPK: ORAL_TABLET | ORAL | 0 refills | Status: DC

## 2017-07-16 MED ORDER — INDOMETHACIN 50 MG PO CAPS: 50.0000 mg | ORAL_CAPSULE | Freq: Three times a day (TID) | ORAL | 0 refills | Status: DC | PRN

## 2017-07-16 MED ORDER — CYCLOBENZAPRINE HCL 10 MG PO TABS: ORAL_TABLET | ORAL | 3 refills | Status: DC

## 2017-07-16 NOTE — Telephone Encounter (Signed)
This is a duplicate note, see previous.

## 2017-07-16 NOTE — Telephone Encounter (Signed)
I sent script e-scribe to [facility].

## 2017-07-16 NOTE — Telephone Encounter (Signed)
Duplicate note

## 2017-07-16 NOTE — Telephone Encounter (Signed)
Per Dr fry order Indocin 50 mg take 1 tid prn.

## 2017-07-24 ENCOUNTER — Encounter: Payer: Self-pay | Admitting: Family Medicine

## 2017-08-11 ENCOUNTER — Encounter: Payer: Self-pay | Admitting: Family Medicine

## 2017-08-11 MED ORDER — OXYCODONE HCL 10 MG PO TABS: 10.0000 mg | ORAL_TABLET | Freq: Four times a day (QID) | ORAL | 0 refills | Status: DC

## 2017-08-11 NOTE — Telephone Encounter (Signed)
Done

## 2017-08-12 ENCOUNTER — Encounter: Payer: Self-pay | Admitting: Family Medicine

## 2017-08-12 NOTE — Telephone Encounter (Signed)
Script is ready for pick up here at front office, sent pt my chart message and letter given.

## 2017-08-21 ENCOUNTER — Ambulatory Visit: Payer: Medicare Other | Admitting: Interventional Cardiology

## 2017-08-21 DIAGNOSIS — M47817 Spondylosis without myelopathy or radiculopathy, lumbosacral region: Secondary | ICD-10-CM | POA: Diagnosis not present

## 2017-08-21 DIAGNOSIS — M4317 Spondylolisthesis, lumbosacral region: Secondary | ICD-10-CM | POA: Diagnosis not present

## 2017-08-24 ENCOUNTER — Ambulatory Visit (HOSPITAL_BASED_OUTPATIENT_CLINIC_OR_DEPARTMENT_OTHER): Payer: Medicare Other | Attending: Physician Assistant | Admitting: Cardiology

## 2017-08-24 VITALS — Ht 72.0 in | Wt 245.0 lb

## 2017-08-24 DIAGNOSIS — I10 Essential (primary) hypertension: Secondary | ICD-10-CM | POA: Diagnosis not present

## 2017-08-24 DIAGNOSIS — G4733 Obstructive sleep apnea (adult) (pediatric): Secondary | ICD-10-CM | POA: Diagnosis not present

## 2017-08-24 DIAGNOSIS — R0609 Other forms of dyspnea: Secondary | ICD-10-CM | POA: Insufficient documentation

## 2017-08-27 ENCOUNTER — Other Ambulatory Visit: Payer: Self-pay | Admitting: Family Medicine

## 2017-08-27 NOTE — Telephone Encounter (Signed)
Refill request for Lisinopril/HCTZ 20-25 mg take 1 po qd and a 90 day supply to [facility].

## 2017-08-28 MED ORDER — LISINOPRIL-HYDROCHLOROTHIAZIDE 20-25 MG PO TABS: 1.0000 | ORAL_TABLET | Freq: Every day | ORAL | 0 refills | Status: DC

## 2017-08-28 NOTE — Telephone Encounter (Signed)
I sent script e-scribe to [facility] for a 90 day supply.

## 2017-09-01 ENCOUNTER — Encounter: Payer: Self-pay | Admitting: Family Medicine

## 2017-09-01 ENCOUNTER — Ambulatory Visit (INDEPENDENT_AMBULATORY_CARE_PROVIDER_SITE_OTHER): Payer: Medicare Other | Admitting: Family Medicine

## 2017-09-01 VITALS — BP 128/70 | Temp 98.2°F | Ht 72.0 in | Wt 241.0 lb

## 2017-09-01 DIAGNOSIS — M1990 Unspecified osteoarthritis, unspecified site: Secondary | ICD-10-CM | POA: Diagnosis not present

## 2017-09-01 DIAGNOSIS — I10 Essential (primary) hypertension: Secondary | ICD-10-CM

## 2017-09-01 DIAGNOSIS — Z23 Encounter for immunization: Secondary | ICD-10-CM | POA: Diagnosis not present

## 2017-09-01 MED ORDER — OXYCODONE HCL 10 MG PO TABS: 10.0000 mg | ORAL_TABLET | Freq: Four times a day (QID) | ORAL | 0 refills | Status: DC

## 2017-09-01 NOTE — Patient Instructions (Signed)
WE NOW OFFER   [facility] FAST TRACK!!!  SAME DAY Appointments for ACUTE CARE  Such as: Sprains, Injuries, cuts, abrasions, rashes, muscle pain, joint pain, back pain Colds, flu, sore throats, headache, allergies, cough, fever  Ear pain, sinus and eye infections Abdominal pain, nausea, vomiting, diarrhea, upset stomach Animal/insect bites  3 Easy Ways to Schedule: Walk-In Scheduling Call in scheduling Mychart Sign-up: [email or web address]         

## 2017-09-01 NOTE — Progress Notes (Signed)
   Subjective:    Patient ID: NEWMAN WAREN, male    DOB: October 24, 1946, 71 y.o.   MRN: [id number]  HPI Here to follow up on arthritis pain. He has fairly good control with taking Oxycodone 4 times a day. His BP is stable. He is looking forward to being in a surf fishing tournament at [place] next week.    Review of Systems  Constitutional: Negative.   Respiratory: Negative.   Cardiovascular: Negative.   Musculoskeletal: Positive for arthralgias and back pain.  Neurological: Negative.        Objective:   Physical Exam  Constitutional: He is oriented to person, place, and time. He appears well-developed and well-nourished.  Neck: No thyromegaly present.  Cardiovascular: Normal rate, regular rhythm, normal heart sounds and intact distal pulses.   Pulmonary/Chest: Effort normal and breath sounds normal.  Lymphadenopathy:    He has no cervical adenopathy.  Neurological: He is alert and oriented to person, place, and time.          Assessment & Plan:  His arthritis is stable. We refilled the Oxycodone. HTN is stable.  Alysia Penna, MD

## 2017-09-04 NOTE — Procedures (Deleted)
   Patient Name: Ricky Meyers, Ricky Meyers Date: 08/24/2017 Gender: Male D.O.B: 1946/08/09 Age (years): 71 Referring Provider: Ermalinda Barrios Height (inches): 72 Interpreting Physician: Fransico Him MD, ABSM Weight (lbs): 245 RPSGT: Earney [place] BMI: 33 MRN: [id number] Neck Size: 18.50  CLINICAL INFORMATION The patient is referred for a BiPAP titration to treat sleep apnea.  SLEEP STUDY TECHNIQUE As per the AASM Manual for the Scoring of Sleep and Associated Events v2.3 (April 2016) with a hypopnea requiring 4% desaturations.  The channels recorded and monitored were frontal, central and occipital EEG, electrooculogram (EOG), submentalis EMG (chin), nasal and oral airflow, thoracic and abdominal wall motion, anterior tibialis EMG, snore microphone, electrocardiogram, and pulse oximetry. Bilevel positive airway pressure (BPAP) was initiated at the beginning of the study and titrated to treat sleep-disordered breathing.  MEDICATIONS Medications self-administered by patient taken the night of the study : N/A  RESPIRATORY PARAMETERS Optimal CPAP Pressure (cm):11cm H2O  AHI at Optimal Pressure (/hr)3.5 Overall Minimal O2 (%):89.00  Minimal O2 at Optimal Pressure (%): 0.00  SLEEP ARCHITECTURE Start Time:10:00:40 PM  Stop Time:4:57:40 AM  Total Time (min):417.0  Total Sleep Time (min):284.5 Sleep Latency (min):38.7  Sleep Efficiency (%):68.2  REM Latency (min):242.5  WASO (min): 93.8 Stage N1 (%): 4.75  Stage N2 (%): 70.65  Stage N3 (%): 1.05  Stage R (%):23.55 Supine (%):34.05  Arousal Index (/hr):35.0      CARDIAC DATA The 2 lead EKG demonstrated sinus rhythm. The mean heart rate was 70.20 beats per minute. Other EKG findings include: None.  LEG MOVEMENT DATA The total Periodic Limb Movements of Sleep (PLMS) were 0. The PLMS index was 0.00. A PLMS index of <15 is considered normal in adults.  IMPRESSIONS - An optimal CPAP pressure of 11cm H2O was selected for this  patient based on the available study data. - Mild Central Sleep Apnea was noted during this titration (CAI = 5.1/h). - Severe oxygen desaturations were observed during this titration (min O2 = 81.00%). - The patient snored with moderate snoring volume. - No cardiac abnormalities were observed during this study. - Clinically significant periodic limb movements were not noted during this study. Arousals associated with PLMs were rare.  DIAGNOSIS - Obstructive Sleep Apnea (327.23 [G47.33 ICD-10]) - Nocturnal Hypoxemia (327.26 [G47.36 ICD-10])  RECOMMENDATIONS - Recommend CPAP at 11cm H2O with heated humidity and mask of choice. - Avoid alcohol, sedatives and other CNS depressants that may worsen sleep apnea and disrupt normal sleep architecture. - Sleep hygiene should be reviewed to assess factors that may improve sleep quality. - Weight management and regular exercise should be initiated or continued. - Return to Sleep Center for re-evaluation after 10 weeks of therapy - Overnight pulse oximetry on CPAP to see if supplemental O2 is needed in addition to CPAP.   [place], American Board of Sleep Medicine  ELECTRONICALLY SIGNED ON:  09/04/2017, 2:48 PM [place] PH: [phone]   FX: [phone] [place]

## 2017-09-09 NOTE — Procedures (Signed)
Patient Name: Ricky Meyers, Ricky Meyers Date: 08/24/2017 Gender: Male D.O.B: 1946/08/09 Age (years): 71 Referring Provider: Ermalinda Barrios Height (inches): 41 Interpreting Physician: Fransico Him MD, ABSM Weight (lbs): 245 RPSGT: Earney Hamburg BMI: 33 MRN: [id number] Neck Size: 18.50  CLINICAL INFORMATION Sleep Study Type: Split Night CPAP  Indication for sleep study: Uncontrolled Hypertension  SLEEP STUDY TECHNIQUE As per the AASM Manual for the Scoring of Sleep and Associated Events v2.3 (April 2016) with a hypopnea requiring 4% desaturations.  The channels recorded and monitored were frontal, central and occipital EEG, electrooculogram (EOG), submentalis EMG (chin), nasal and oral airflow, thoracic and abdominal wall motion, anterior tibialis EMG, snore microphone, electrocardiogram, and pulse oximetry. Continuous positive airway pressure (CPAP) was initiated when the patient met split night criteria and was titrated according to treat sleep-disordered breathing.  MEDICATIONS Medications self-administered by patient taken the night of the study : N/A  RESPIRATORY PARAMETERS Diagnostic Total AHI (/hr): 70.1  RDI (/hr):71.9  OA Index (/hr): 34  CA Index (/hr): 1.3 REM AHI (/hr): N/A  NREM AHI (/hr):70.1  Supine AHI (/hr):75.5  Non-supine AHI (/hr):65.38 Min O2 Sat (%):81.00  Mean O2 (%): 92.53  Time below 88% (min):18.3    Titration Optimal Pressure (cm):11cm  AHI at Optimal Pressure (/hr):3.5  Min O2 at Optimal Pressure (%):89.00 Supine % at Optimal (%):N/A  Sleep % at Optimal (%):N/A    SLEEP ARCHITECTURE The recording time for the entire night was 417.0 minutes.  During a baseline period of 210.0 minutes, the patient slept for 137.8 minutes in REM and nonREM, yielding a sleep efficiency of 65.6%. Sleep onset after lights out was 38.7 minutes with a REM latency of N/A minutes. The patient spent 2.90% of the night in stage N1 sleep, 94.92% in stage N2 sleep,  2.18% in stage N3 and 0.00% in REM.  During the titration period of 202.2 minutes, the patient slept for 146.5 minutes in REM and nonREM, yielding a sleep efficiency of 72.5%. Sleep onset after CPAP initiation was 8.9 minutes with a REM latency of 57.5 minutes. The patient spent 6.48% of the night in stage N1 sleep, 47.78% in stage N2 sleep, 0.00% in stage N3 and 45.73% in REM.  CARDIAC DATA The 2 lead EKG demonstrated sinus rhythm. The mean heart rate was 70.20 beats per minute. Other EKG findings include: None.  LEG MOVEMENT DATA The total Periodic Limb Movements of Sleep (PLMS) were 0. The PLMS index was 0.00 .  IMPRESSIONS - Severe obstructive sleep apnea occurred during the diagnostic portion of the study (AHI = 70.1/hour). An optimal PAP pressure could not be selected for this patient based on the available study data. - Mild central sleep apnea occurred during the diagnostic portion of the study (CAI = 1.3/hour). - Mild oxygen desaturation was noted during the diagnostic portion of the study (Min O2 = 81.00%). - The patient snored with moderate snoring volume during the diagnostic portion of the study. - No cardiac abnormalities were noted during this study. - Clinically significant periodic limb movements did not occur during sleep.  DIAGNOSIS - Obstructive Sleep Apnea (327.23 [G47.33 ICD-10])  RECOMMENDATIONS - Recommend CPAP at 11cm H2O with heated humidity and mask of choice. - Avoid alcohol, sedatives and other CNS depressants that may worsen sleep apnea and disrupt normal sleep architecture. - Sleep hygiene should be reviewed to assess factors that may improve sleep quality. - Weight management and regular exercise should be initiated or continued. - Followup in Sleep clinic in 10 weeks.  Reader, American Board of Sleep Medicine  ELECTRONICALLY SIGNED ON:  09/09/2017, 8:15 PM [place] PH: [phone]   FX: [phone]  [phone] [place]

## 2017-09-10 ENCOUNTER — Telehealth: Payer: Self-pay | Admitting: *Deleted

## 2017-09-10 NOTE — Telephone Encounter (Signed)
-----   Message from Sueanne Margarita, MD sent at 09/09/2017  8:21 PM EST ----- Please let patient know that they have significant sleep apnea and had successful CPAP titration and will be set up with CPAP unit.  Please let DME know that order is in EPIC.  Please set patient up for OV in 10 weeks

## 2017-09-10 NOTE — Telephone Encounter (Signed)
LMTCB

## 2017-09-14 NOTE — Progress Notes (Signed)
I order the CPAP

## 2017-09-15 NOTE — Telephone Encounter (Deleted)
-----   Message from Sueanne Margarita, MD sent at 09/09/2017  8:21 PM EST ----- Please let patient know that they have significant sleep apnea and had successful CPAP titration and will be set up with CPAP unit.  Please let DME know that order is in EPIC.  Please set patient up for OV in 10 weeks

## 2017-09-15 NOTE — Telephone Encounter (Signed)
Informed patient of titration results and verbalized understanding was indicated. Patient understands they have significant sleep apnea and had successful CPAP titration. Patient understands Dr Radford Pax has ordered her a CPAP. Patient understands he will be contacted by [facility] to set up his cpap. He understands to call if [facility] does not contact him with new setup in a timely manner. He understands he will be called once confirmation has been received from [facility] that he has received his new machine to schedule 10 week follow up appointment.  [name] notified of new cpap order in epic Please add to Ricky Meyers He was grateful for the call and thanked me

## 2017-09-23 ENCOUNTER — Encounter: Payer: Self-pay | Admitting: Family Medicine

## 2017-09-23 ENCOUNTER — Ambulatory Visit (INDEPENDENT_AMBULATORY_CARE_PROVIDER_SITE_OTHER): Payer: Medicare Other | Admitting: Family Medicine

## 2017-09-23 VITALS — BP 102/62 | HR 67 | Temp 98.4°F | Ht 69.5 in | Wt 248.8 lb

## 2017-09-23 DIAGNOSIS — S46911A Strain of unspecified muscle, fascia and tendon at shoulder and upper arm level, right arm, initial encounter: Secondary | ICD-10-CM

## 2017-09-23 NOTE — Progress Notes (Signed)
   Subjective:    Patient ID: Ricky Meyers, male    DOB: 10-17-1946, 71 y.o.   MRN: [id number]  HPI Here for an injury to the right shoulder which occurred at home 6 weeks ago. He tripped over a rock and fell forward with his arms outstretched to catch himself. Since then he has had a lot of pain in the shoulder and limited ROM. He cannot raise the arm above his head. Using his usual Oxycodone to get by.    Review of Systems  Constitutional: Negative.   Respiratory: Negative.   Cardiovascular: Negative.   Musculoskeletal: Positive for arthralgias.       Objective:   Physical Exam  Constitutional: He appears well-developed and well-nourished.  Cardiovascular: Normal rate, regular rhythm, normal heart sounds and intact distal pulses.  Pulmonary/Chest: Effort normal and breath sounds normal.  Musculoskeletal:  The right shoulder is tender posteriorly and anteriorly. No crepitus. Internal and external rotation is full. He cannot abduct past horizontal due to pain           Assessment & Plan:  Probable torn rotator cuff. He can use Oxycodone and Indocin for pain. Refer to Orthopedics soon.  Alysia Penna, MD

## 2017-09-30 ENCOUNTER — Encounter: Payer: Self-pay | Admitting: Family Medicine

## 2017-10-02 DIAGNOSIS — S46811A Strain of other muscles, fascia and tendons at shoulder and upper arm level, right arm, initial encounter: Secondary | ICD-10-CM | POA: Diagnosis not present

## 2017-10-02 DIAGNOSIS — M79601 Pain in right arm: Secondary | ICD-10-CM | POA: Diagnosis not present

## 2017-10-07 ENCOUNTER — Encounter: Payer: Self-pay | Admitting: Family Medicine

## 2017-10-08 MED ORDER — OXYCODONE HCL 10 MG PO TABS: 10.0000 mg | ORAL_TABLET | Freq: Four times a day (QID) | ORAL | 0 refills | Status: DC

## 2017-10-08 NOTE — Telephone Encounter (Signed)
Done but he needs a pmv soon

## 2017-10-10 ENCOUNTER — Ambulatory Visit: Payer: Self-pay | Admitting: *Deleted

## 2017-10-10 DIAGNOSIS — S46811D Strain of other muscles, fascia and tendons at shoulder and upper arm level, right arm, subsequent encounter: Secondary | ICD-10-CM | POA: Diagnosis not present

## 2017-10-10 NOTE — Telephone Encounter (Signed)
Sent to PCP for approval.  

## 2017-10-10 NOTE — Telephone Encounter (Signed)
Please tell the pharmacy an early refill is okay

## 2017-10-10 NOTE — Telephone Encounter (Signed)
Called to pharmacy and gave them the OK to refill early.

## 2017-10-10 NOTE — Telephone Encounter (Signed)
Pharmacy just received a hard copy of an Rx for Oxycodone 10mg .   Dr. Janee Morn wrote it to be filled on 10/11/17 however the Ricky Meyers is going out of town and wanting to know if it can be filled today. I routed a note to Dr. Barbie Banner nurse pool.

## 2017-10-14 ENCOUNTER — Ambulatory Visit: Payer: Medicare Other | Admitting: Family Medicine

## 2017-10-16 ENCOUNTER — Ambulatory Visit (INDEPENDENT_AMBULATORY_CARE_PROVIDER_SITE_OTHER): Payer: Medicare Other | Admitting: Family Medicine

## 2017-10-16 ENCOUNTER — Encounter: Payer: Self-pay | Admitting: Family Medicine

## 2017-10-16 VITALS — BP 118/82 | HR 109 | Temp 98.4°F | Wt 248.8 lb

## 2017-10-16 DIAGNOSIS — F1190 Opioid use, unspecified, uncomplicated: Secondary | ICD-10-CM

## 2017-10-16 DIAGNOSIS — M1990 Unspecified osteoarthritis, unspecified site: Secondary | ICD-10-CM | POA: Diagnosis not present

## 2017-10-16 MED ORDER — OXYCODONE HCL 10 MG PO TABS: 10.0000 mg | ORAL_TABLET | Freq: Four times a day (QID) | ORAL | 0 refills | Status: DC

## 2017-10-16 NOTE — Progress Notes (Signed)
   Subjective:    Patient ID: Ricky Meyers, male    DOB: [birth year], 71 y.o.   MRN: [id number]  HPI Here for a pain management visit. He is doing well for now.  Indication for chronic opioid: osteoarthritis  Medication and dose:Oxycodone 10 mg  # pills per month: 120 Last UDS date: 10-16-17 Pain contract signed (Y/N):   10-16-17                                                                                                                                                                      Date narcotic database last reviewed (include red flags): 10-16-17    Review of Systems  Constitutional: Negative.   Respiratory: Negative.   Cardiovascular: Negative.   Musculoskeletal: Positive for arthralgias and back pain.       Objective:   Physical Exam  Constitutional: He appears well-developed and well-nourished.  Cardiovascular: Normal rate, regular rhythm, normal heart sounds and intact distal pulses.  Pulmonary/Chest: Effort normal and breath sounds normal. No respiratory distress. He has no wheezes. He has no rales.          Assessment & Plan:  His joint pains are stable. Medication is refilled.  Alysia Penna, MD

## 2017-10-17 DIAGNOSIS — S46011A Strain of muscle(s) and tendon(s) of the rotator cuff of right shoulder, initial encounter: Secondary | ICD-10-CM | POA: Diagnosis not present

## 2017-10-17 DIAGNOSIS — S46811D Strain of other muscles, fascia and tendons at shoulder and upper arm level, right arm, subsequent encounter: Secondary | ICD-10-CM | POA: Diagnosis not present

## 2017-10-21 ENCOUNTER — Telehealth: Payer: Self-pay | Admitting: *Deleted

## 2017-10-21 NOTE — Telephone Encounter (Signed)
   [facility] Pre-operative Risk Assessment    Request for surgical clearance:  1. What type of surgery is being performed? RIGHT SHOULDER ROTATOR CUFF REPAIR  2. When is this surgery scheduled? TBD  3. Are there any medications that need to be held prior to surgery and how long?   4. Practice name and name of physician performing surgery? [facility]; DR. GIOFFRE  5. What is your office phone and fax number? FAX [phone]       PHONE # [phone]  6. Anesthesia type (None, local, MAC, general) ?    Julaine Hua 10/21/2017, 1:37 PM  _________________________________________________________________   (provider comments below)

## 2017-10-21 NOTE — Telephone Encounter (Signed)
   Primary Cardiologist: Larae Grooms, MD  Chart reviewed as part of pre-operative protocol coverage. Given past medical history and time since last visit, based on ACC/AHA guidelines, IZIC STFORT would be at acceptable risk for the planned procedure without further cardiovascular testing. He should remain on statin therapy throughout the perioperative period.  Please call with questions.  Murray Hodgkins, NP 10/21/2017, 6:05 PM

## 2017-10-22 ENCOUNTER — Telehealth: Payer: Self-pay | Admitting: Interventional Cardiology

## 2017-10-22 LAB — PAIN MGMT, PROFILE 8 W/CONF, U
6 ACETYLMORPHINE: NEGATIVE ng/mL (ref <10)
ALPHAHYDROXYTRIAZOLAM: NEGATIVE ng/mL (ref <50)
Alcohol Metabolites: POSITIVE ng/mL — AB (ref <500)
Alphahydroxyalprazolam: NEGATIVE ng/mL (ref <25)
Alphahydroxymidazolam: NEGATIVE ng/mL (ref <50)
Aminoclonazepam: NEGATIVE ng/mL (ref <25)
Amphetamines: NEGATIVE ng/mL (ref <500)
BENZODIAZEPINES: POSITIVE ng/mL — AB (ref <100)
BUPRENORPHINE, URINE: NEGATIVE ng/mL (ref <5)
COCAINE METABOLITE: NEGATIVE ng/mL (ref <150)
CODEINE: NEGATIVE ng/mL (ref <50)
Creatinine: 142.5 mg/dL
ETHYL SULFATE (ETS): 707 ng/mL — AB (ref <100)
Ethyl Glucuronide (ETG): 1557 ng/mL — ABNORMAL HIGH (ref <500)
HYDROMORPHONE: NEGATIVE ng/mL (ref <50)
Hydrocodone: NEGATIVE ng/mL (ref <50)
Hydroxyethylflurazepam: NEGATIVE ng/mL (ref <50)
LORAZEPAM: NEGATIVE ng/mL (ref <50)
MARIJUANA METABOLITE: NEGATIVE ng/mL (ref <20)
MDMA: NEGATIVE ng/mL (ref <500)
Morphine: NEGATIVE ng/mL (ref <50)
NORDIAZEPAM: NEGATIVE ng/mL (ref <50)
NORHYDROCODONE: NEGATIVE ng/mL (ref <50)
Noroxycodone: 8327 ng/mL — ABNORMAL HIGH (ref <50)
OPIATES: NEGATIVE ng/mL (ref <100)
OXYMORPHONE: 978 ng/mL — AB (ref <50)
Oxazepam: NEGATIVE ng/mL (ref <50)
Oxidant: NEGATIVE ug/mL (ref <200)
Oxycodone: 4735 ng/mL — ABNORMAL HIGH (ref <50)
Oxycodone: POSITIVE ng/mL — AB (ref <100)
Temazepam: 120 ng/mL — ABNORMAL HIGH (ref <50)
pH: 6.21 (ref 4.5–9.0)

## 2017-10-22 NOTE — Telephone Encounter (Signed)
Checked, clearance request received, APP staff to contact patient for updated status since last visit.

## 2017-10-22 NOTE — Telephone Encounter (Signed)
F/u message   Ms. Ricky Meyers call requesting to speak with RN to see if pt surgical clearance was received.

## 2017-10-23 NOTE — Telephone Encounter (Signed)
Cardiac clearance routed to Dr Charlestine Night office, advised wife

## 2017-10-29 ENCOUNTER — Encounter: Payer: Self-pay | Admitting: Cardiology

## 2017-10-31 ENCOUNTER — Encounter: Payer: Self-pay | Admitting: Family Medicine

## 2017-11-03 NOTE — Telephone Encounter (Signed)
The recliner sounds like a good idea but Medicare will not cover it.

## 2017-11-04 HISTORY — PX: ROTATOR CUFF REPAIR: SHX139

## 2017-11-05 ENCOUNTER — Other Ambulatory Visit: Payer: Self-pay | Admitting: Family Medicine

## 2017-11-05 NOTE — Telephone Encounter (Signed)
Last OV 10/16/2017. Flexeril was last refilled 07/16/2017 disp 90 with 3 refills. Allopurinol was last refilled 04/30/2017 disp 30 with 6 refills. Sent to PCP for approval.

## 2017-11-06 ENCOUNTER — Telehealth: Payer: Self-pay | Admitting: *Deleted

## 2017-11-06 DIAGNOSIS — M75121 Complete rotator cuff tear or rupture of right shoulder, not specified as traumatic: Secondary | ICD-10-CM | POA: Diagnosis not present

## 2017-11-06 DIAGNOSIS — M7541 Impingement syndrome of right shoulder: Secondary | ICD-10-CM | POA: Diagnosis not present

## 2017-11-06 DIAGNOSIS — M75111 Incomplete rotator cuff tear or rupture of right shoulder, not specified as traumatic: Secondary | ICD-10-CM | POA: Diagnosis not present

## 2017-11-06 DIAGNOSIS — G8918 Other acute postprocedural pain: Secondary | ICD-10-CM | POA: Diagnosis not present

## 2017-11-06 NOTE — Telephone Encounter (Signed)
Return call: Reached out to patient and he says yes he does have a leak in his mask. Patient states his mask is cracked around the hinge part and he has contacted [facility] for another mask but has not received it yet.

## 2017-11-06 NOTE — Telephone Encounter (Signed)
-----   Message from Sueanne Margarita, MD sent at 10/30/2017 11:32 PM EST ----- Please find out if patient thinks his mask is leaking

## 2017-12-03 ENCOUNTER — Encounter: Payer: Self-pay | Admitting: Family Medicine

## 2017-12-04 ENCOUNTER — Encounter: Payer: Self-pay | Admitting: Family Medicine

## 2017-12-09 NOTE — Telephone Encounter (Signed)
Call in #[phone] with 5 rf 

## 2017-12-10 ENCOUNTER — Other Ambulatory Visit: Payer: Self-pay | Admitting: Family Medicine

## 2017-12-18 ENCOUNTER — Ambulatory Visit (INDEPENDENT_AMBULATORY_CARE_PROVIDER_SITE_OTHER): Payer: Medicare Other | Admitting: Cardiology

## 2017-12-18 ENCOUNTER — Telehealth: Payer: Self-pay | Admitting: *Deleted

## 2017-12-18 ENCOUNTER — Encounter: Payer: Self-pay | Admitting: Cardiology

## 2017-12-18 VITALS — BP 108/70 | HR 89 | Ht 69.5 in | Wt 246.0 lb

## 2017-12-18 DIAGNOSIS — I10 Essential (primary) hypertension: Secondary | ICD-10-CM | POA: Diagnosis not present

## 2017-12-18 DIAGNOSIS — E669 Obesity, unspecified: Secondary | ICD-10-CM | POA: Diagnosis not present

## 2017-12-18 DIAGNOSIS — G4733 Obstructive sleep apnea (adult) (pediatric): Secondary | ICD-10-CM | POA: Diagnosis not present

## 2017-12-18 HISTORY — DX: Obstructive sleep apnea (adult) (pediatric): G47.33

## 2017-12-18 NOTE — Patient Instructions (Addendum)
Medication Instructions:  Your physician recommends that you continue on your current medications as directed. Please refer to the Current Medication list given to you today.  If you need a refill on your cardiac medications, please contact your pharmacy first.  Labwork: None ordered   Testing/Procedures: None ordered   Follow-Up: Your physician wants you to follow-up in: 1 year with Dr. Turner. You will receive a reminder letter in the mail two months in advance. If you don't receive a letter, please call our office to schedule the follow-up appointment.  Any Other Special Instructions Will Be Listed Below (If Applicable).   Thank you for choosing [facility]    Rena Alaia Lordi, RN  [phone]  If you need a refill on your cardiac medications before your next appointment, please call your pharmacy.   

## 2017-12-18 NOTE — Progress Notes (Signed)
Cardiology Office Note:    Date:  12/18/2017   ID:  [name], [name] 25-Mar-1946, MRN [id number]  PCP:  [name], MD  Cardiologist:  Larae Grooms, MD    Referring MD: [name], MD   Chief Complaint  Patient presents with  . Sleep Apnea    History of Present Illness:    Ricky Meyers is a 72 y.o. male with a hx of GERD, Hyperlipidemia and HTN who was referred for sleep study by Ermalinda Barrios, PA due to poorly controlled HTN, excessive daytimes sleepiness and nonrestorative sleep as well as snoring.  He underwent PSG showing seere OSA with an AHI of 70.1/hr and underwent CPAP titration to 11cm H2O.  He is doing well with his CPAP device and thinks that he has gotten used to it.  He tolerates the full face mask and feels the pressure is adequate.  Since going on CPAP he feels rested in the am and has no significant daytime sleepiness. He has some problems with dry mouth.  He does not think that he snores.     Past Medical History:  Diagnosis Date  . Arthritis    neck and back   . Chronic neck pain   . Depression   . ED (erectile dysfunction)   . GERD (gastroesophageal reflux disease)    dysphagia  . Gout    sees Dr. Leigh Aurora   . Hyperlipidemia   . Hypertension   . OSA (obstructive sleep apnea) 12/18/2017   Severe with AHI at 70.1/hr and is now on CPAP at [facility]  . Prostate cancer [facility])    history of  . Sleep apnea    snoring/never checked    Past Surgical History:  Procedure Laterality Date  . CERVICAL FUSION  12/02/07   per Dr. Sherley Bounds  . COLONOSCOPY  12/22/06   per Dr. Ardis Hughs, repeat in 10 yrs  . injection lower back Right 09/17/2016  . PROSTATECTOMY    . TONSILLECTOMY    . VASECTOMY      Current Medications: Current Meds  Medication Sig  . allopurinol (ZYLOPRIM) 300 MG tablet TAKE 1 TABLET BY MOUTH EVERY DAY  . amLODipine (NORVASC) 5 MG tablet TAKE ONE TABLET BY MOUTH ONCE DAILY  . atorvastatin (LIPITOR) 40 MG tablet TAKE ONE  TABLET BY MOUTH ONCE DAILY  . cyclobenzaprine (FLEXERIL) 10 MG tablet TAKE ONE TABLET BY MOUTH THREE TIMES DAILY AS NEEDED FOR MUSCLE SPASMS  . gabapentin (NEURONTIN) 100 MG capsule TAKE 1 CAPSULE BY MOUTH 2 TIMES DAILY  . indomethacin (INDOCIN) 50 MG capsule Take 1 capsule (50 mg total) by mouth 3 (three) times daily as needed.  Marland Kitchen lisinopril-hydrochlorothiazide (PRINZIDE,ZESTORETIC) 20-25 MG tablet Take 1 tablet by mouth daily.  . methylPREDNISolone (MEDROL DOSEPAK) 4 MG TBPK tablet Take as directed  . Oxycodone HCl 10 MG TABS Take 1 tablet (10 mg total) by mouth every 6 (six) hours.  . pantoprazole (PROTONIX) 40 MG tablet Take 1 tablet (40 mg total) by mouth at bedtime.  Marland Kitchen PARoxetine (PAXIL) 20 MG tablet TAKE ONE TABLET BY MOUTH IN THE MORNING  . psyllium (METAMUCIL) 58.6 % packet Take 1 packet by mouth daily as needed (for constipation).    Current Facility-Administered Medications for the 12/18/17 encounter (Office Visit) with Sueanne Margarita, MD  Medication  . 0.9 %  sodium chloride infusion  . 0.9 %  sodium chloride infusion     Allergies:   Patient has no known allergies.  Social History   Socioeconomic History  . Marital status: Married    Spouse name: None  . Number of children: None  . Years of education: None  . Highest education level: None  Social Needs  . Financial resource strain: None  . Food insecurity - worry: None  . Food insecurity - inability: None  . Transportation needs - medical: None  . Transportation needs - non-medical: None  Occupational History  . None  Tobacco Use  . Smoking status: Never Smoker  . Smokeless tobacco: Never Used  Substance and Sexual Activity  . Alcohol use: Yes    Alcohol/week: 0.0 oz    Comment: occ  . Drug use: No  . Sexual activity: None  Other Topics Concern  . None  Social History Narrative  . None     Family History: [name] family history includes Breast cancer in his mother; Cancer in his other; Dementia  in his sister; Diverticulitis in his mother; Heart disease in his father; Hypertension in his other; Prostate cancer in his father; Stroke in his father and other. There is no history of Colon cancer.  ROS:   Please see the history of present illness.    ROS  All other systems reviewed and negative.   EKGs/Labs/Other Studies Reviewed:    The following studies were reviewed today: CPAP download  EKG:  EKG is not ordered today.   *  Recent Labs: 06/10/2017: BUN 14; Creatinine, Ser 1.13; Hemoglobin 13.5; Platelets 202; Potassium 3.9; Sodium 136   Recent Lipid Panel    Component Value Date/Time   CHOL 148 03/07/2016 1051   TRIG 53.0 03/07/2016 1051   HDL 63.80 03/07/2016 1051   CHOLHDL 2 03/07/2016 1051   VLDL 10.6 03/07/2016 1051   LDLCALC 74 03/07/2016 1051   LDLDIRECT 161.4 09/29/2012 0811    Physical Exam:    VS:  Ht 5' 9.5" (1.765 m)   Wt 246 lb (111.6 kg)   BMI 35.81 kg/m     Wt Readings from Last 3 Encounters:  12/18/17 246 lb (111.6 kg)  10/16/17 248 lb 12.8 oz (112.9 kg)  09/23/17 248 lb 12.8 oz (112.9 kg)     GEN:  Well nourished, well developed in no acute distress HEENT: Normal NECK: No JVD; No carotid bruits LYMPHATICS: No lymphadenopathy CARDIAC: RRR, no murmurs, rubs, gallops RESPIRATORY:  Clear to auscultation without rales, wheezing or rhonchi  ABDOMEN: Soft, non-tender, non-distended MUSCULOSKELETAL:  No edema; No deformity  SKIN: Warm and dry NEUROLOGIC:  Alert and oriented x 3 PSYCHIATRIC:  Normal affect   ASSESSMENT:    1. OSA (obstructive sleep apnea)   2. Essential hypertension   3. Obesity (BMI 30.0-34.9)    PLAN:    In order of problems listed above:  1,  OSA - the patient is tolerating PAP therapy well without any problems. The PAP download was reviewed today and showed an AHI of 16.1/hr on 11 cm H2O with 40% compliance in using more than 4 hours nightly.  The patient has been using and benefiting from PAP use and will continue to  benefit from therapy. Given his persistent elevated AHI I will get an autotitration from 4 to 18cm H2O.  His compliance is down because he tore his rotator cuff and did not sleep hardly at all at night for several weeks.  I have instructed him how to adjust his humidity to help with the mouth dryness.    2.  HTN - Bp is well controlled  on exam today.  Continue amlodipine 5mg  daily, ACE I and diuretic.  3.  Obesity - I have encouraged him to get into a routine exercise program and cut back on carbs and portions.    Medication Adjustments/Labs and Tests Ordered: Current medicines are reviewed at length with the patient today.  Concerns regarding medicines are outlined above.  No orders of the defined types were placed in this encounter.  No orders of the defined types were placed in this encounter.   Signed, Fransico Him, MD  12/18/2017 10:14 AM    [place]

## 2017-12-18 NOTE — Telephone Encounter (Signed)
-----   Message from Teressa Senter, RN sent at 12/18/2017 10:32 AM EST ----- Regarding: DME order  DME order placed.   Thanks  [employer]

## 2017-12-18 NOTE — Telephone Encounter (Signed)
Order sent to [facility] via community message to get a 2 week autotitration from 4-20 cm H2O

## 2017-12-19 DIAGNOSIS — M25611 Stiffness of right shoulder, not elsewhere classified: Secondary | ICD-10-CM | POA: Diagnosis not present

## 2017-12-23 DIAGNOSIS — M25611 Stiffness of right shoulder, not elsewhere classified: Secondary | ICD-10-CM | POA: Diagnosis not present

## 2017-12-26 DIAGNOSIS — M25611 Stiffness of right shoulder, not elsewhere classified: Secondary | ICD-10-CM | POA: Diagnosis not present

## 2017-12-30 DIAGNOSIS — M25611 Stiffness of right shoulder, not elsewhere classified: Secondary | ICD-10-CM | POA: Diagnosis not present

## 2018-01-01 DIAGNOSIS — M47817 Spondylosis without myelopathy or radiculopathy, lumbosacral region: Secondary | ICD-10-CM | POA: Diagnosis not present

## 2018-01-02 DIAGNOSIS — M25611 Stiffness of right shoulder, not elsewhere classified: Secondary | ICD-10-CM | POA: Diagnosis not present

## 2018-01-05 ENCOUNTER — Other Ambulatory Visit: Payer: Self-pay | Admitting: Family Medicine

## 2018-01-05 DIAGNOSIS — M25611 Stiffness of right shoulder, not elsewhere classified: Secondary | ICD-10-CM | POA: Diagnosis not present

## 2018-01-07 DIAGNOSIS — M25611 Stiffness of right shoulder, not elsewhere classified: Secondary | ICD-10-CM | POA: Diagnosis not present

## 2018-01-22 DIAGNOSIS — M25611 Stiffness of right shoulder, not elsewhere classified: Secondary | ICD-10-CM | POA: Diagnosis not present

## 2018-01-28 ENCOUNTER — Encounter: Payer: Self-pay | Admitting: Cardiology

## 2018-02-06 ENCOUNTER — Other Ambulatory Visit: Payer: Self-pay | Admitting: Family Medicine

## 2018-02-24 DIAGNOSIS — M25611 Stiffness of right shoulder, not elsewhere classified: Secondary | ICD-10-CM | POA: Diagnosis not present

## 2018-02-24 DIAGNOSIS — M25511 Pain in right shoulder: Secondary | ICD-10-CM | POA: Diagnosis not present

## 2018-02-25 DIAGNOSIS — M47817 Spondylosis without myelopathy or radiculopathy, lumbosacral region: Secondary | ICD-10-CM | POA: Diagnosis not present

## 2018-02-25 DIAGNOSIS — Z6833 Body mass index (BMI) 33.0-33.9, adult: Secondary | ICD-10-CM | POA: Diagnosis not present

## 2018-03-02 ENCOUNTER — Other Ambulatory Visit: Payer: Self-pay | Admitting: Family Medicine

## 2018-03-10 ENCOUNTER — Other Ambulatory Visit: Payer: Self-pay | Admitting: Family Medicine

## 2018-03-10 ENCOUNTER — Encounter: Payer: Self-pay | Admitting: Family Medicine

## 2018-03-13 ENCOUNTER — Encounter: Payer: Self-pay | Admitting: Family Medicine

## 2018-03-13 ENCOUNTER — Ambulatory Visit (INDEPENDENT_AMBULATORY_CARE_PROVIDER_SITE_OTHER): Payer: Medicare Other | Admitting: Family Medicine

## 2018-03-13 VITALS — BP 106/70 | HR 102 | Temp 97.6°F | Ht 69.5 in | Wt 248.6 lb

## 2018-03-13 DIAGNOSIS — F1190 Opioid use, unspecified, uncomplicated: Secondary | ICD-10-CM

## 2018-03-13 DIAGNOSIS — M1990 Unspecified osteoarthritis, unspecified site: Secondary | ICD-10-CM | POA: Diagnosis not present

## 2018-03-13 MED ORDER — OXYCODONE HCL 10 MG PO TABS: 10.0000 mg | ORAL_TABLET | Freq: Four times a day (QID) | ORAL | 0 refills | Status: AC

## 2018-03-13 MED ORDER — OXYCODONE HCL 10 MG PO TABS: 10.0000 mg | ORAL_TABLET | Freq: Four times a day (QID) | ORAL | 0 refills | Status: AC | PRN

## 2018-03-13 MED ORDER — OXYCODONE HCL 10 MG PO TABS: 10.0000 mg | ORAL_TABLET | Freq: Four times a day (QID) | ORAL | 0 refills | Status: DC | PRN

## 2018-03-13 NOTE — Progress Notes (Signed)
   Subjective:    Patient ID: Ricky Meyers, male    DOB: Feb 11, 1946, 72 y.o.   MRN: [id number]  HPI Here for pain management. He had been doing well with generalized osteoarthritis, but he had worsening problems with right shoulder pain. He has been seeing Dr. Lindwood Qua for this, and Dr. Gladstone Lighter has been providing him with Percocet for pain control. He ended up having a surgical rotator cuff repair on 11-06-17, and this went well. He has gone through rehab and now has full ROM with little pain. Dr. Gladstone Lighter has returned Ricky Meyers to [place] to manage his pain again.  Indication for chronic opioid: osteoarthritis  Medication and dose: Oxycodone 10 mg  # pills per month: 120 Last UDS date: 10-16-17  Opioid Treatment Agreement signed (Y/N): 03-13-18 Opioid Treatment Agreement last reviewed with patient:  03-13-18 NCCSRS reviewed this encounter (include red flags):  03-13-18    Review of Systems  Constitutional: Negative.   Respiratory: Negative.   Cardiovascular: Negative.   Musculoskeletal: Positive for arthralgias.  Neurological: Negative.        Objective:   Physical Exam  Constitutional: He is oriented to person, place, and time. He appears well-developed and well-nourished.  Cardiovascular: Normal rate, regular rhythm, normal heart sounds and intact distal pulses.  Pulmonary/Chest: Effort normal and breath sounds normal.  Neurological: He is alert and oriented to person, place, and time.          Assessment & Plan:  Pain management. Meds were refilled.  Alysia Penna, MD

## 2018-04-13 ENCOUNTER — Other Ambulatory Visit: Payer: Self-pay | Admitting: Family Medicine

## 2018-04-14 NOTE — Telephone Encounter (Signed)
Last refilled 11/07/2017 disp 90 with 3 refills    Last OV 03/13/2018   Sent to PCP for approval

## 2018-06-02 ENCOUNTER — Other Ambulatory Visit: Payer: Self-pay | Admitting: Family Medicine

## 2018-06-04 ENCOUNTER — Other Ambulatory Visit: Payer: Self-pay | Admitting: Family Medicine

## 2018-06-09 ENCOUNTER — Encounter: Payer: Self-pay | Admitting: Family Medicine

## 2018-06-09 NOTE — Telephone Encounter (Signed)
Last fill 05/13/18 Last OV 03/13/18  Ok to fill?

## 2018-06-10 ENCOUNTER — Encounter: Payer: Self-pay | Admitting: Family Medicine

## 2018-06-10 ENCOUNTER — Ambulatory Visit (INDEPENDENT_AMBULATORY_CARE_PROVIDER_SITE_OTHER): Payer: Medicare Other | Admitting: Family Medicine

## 2018-06-10 ENCOUNTER — Other Ambulatory Visit: Payer: Self-pay | Admitting: Family Medicine

## 2018-06-10 VITALS — BP 112/64 | HR 89 | Temp 98.5°F | Ht 69.5 in | Wt 259.5 lb

## 2018-06-10 DIAGNOSIS — M1990 Unspecified osteoarthritis, unspecified site: Secondary | ICD-10-CM

## 2018-06-10 DIAGNOSIS — F1190 Opioid use, unspecified, uncomplicated: Secondary | ICD-10-CM | POA: Diagnosis not present

## 2018-06-10 MED ORDER — OXYCODONE HCL 10 MG PO TABS: 10.0000 mg | ORAL_TABLET | Freq: Four times a day (QID) | ORAL | 0 refills | Status: DC | PRN

## 2018-06-10 MED ORDER — NAPROXEN 500 MG PO TABS
500.0000 mg | ORAL_TABLET | Freq: Two times a day (BID) | ORAL | 5 refills | Status: DC
Start: 2018-06-10 — End: 2018-11-12

## 2018-06-10 NOTE — Telephone Encounter (Signed)
He was seen today

## 2018-06-10 NOTE — Progress Notes (Signed)
   Subjective:    Patient ID: Ricky Meyers, male    DOB: [birth year], 72 y.o.   MRN: [id number]  HPI Here for pain management. He is doing well. He takes Aleve with minor results in addition to the Oxycodone.  Indication for chronic opioid: osteoarthritis  Medication and dose: Oxycodone 10 mg  # pills per month: 120 Last UDS date: 10-16-17 Opioid Treatment Agreement signed (Y/N): 03-13-18 Opioid Treatment Agreement last reviewed with patient:  06-10-18 NCCSRS reviewed this encounter (include red flags):  06-10-18    Review of Systems  Constitutional: Negative.   Respiratory: Negative.   Cardiovascular: Negative.   Musculoskeletal: Positive for arthralgias.  Neurological: Negative.        Objective:   Physical Exam  Constitutional: He is oriented to person, place, and time. He appears well-developed and well-nourished.  Cardiovascular: Normal rate, regular rhythm, normal heart sounds and intact distal pulses.  Pulmonary/Chest: Effort normal and breath sounds normal.  Neurological: He is alert and oriented to person, place, and time.          Assessment & Plan:  Pain management, meds were refilled. He will add Naproxen 500 mg bid.  Alysia Penna, MD

## 2018-06-15 ENCOUNTER — Encounter: Payer: Self-pay | Admitting: Family Medicine

## 2018-06-15 ENCOUNTER — Ambulatory Visit (INDEPENDENT_AMBULATORY_CARE_PROVIDER_SITE_OTHER): Payer: Medicare Other | Admitting: Family Medicine

## 2018-06-15 VITALS — BP 130/68 | HR 99 | Temp 97.9°F | Ht 69.5 in | Wt 249.8 lb

## 2018-06-15 DIAGNOSIS — L989 Disorder of the skin and subcutaneous tissue, unspecified: Secondary | ICD-10-CM

## 2018-06-15 DIAGNOSIS — H6690 Otitis media, unspecified, unspecified ear: Secondary | ICD-10-CM | POA: Diagnosis not present

## 2018-06-15 MED ORDER — AMOXICILLIN-POT CLAVULANATE 875-125 MG PO TABS: 1.0000 | ORAL_TABLET | Freq: Two times a day (BID) | ORAL | 0 refills | Status: DC

## 2018-06-15 NOTE — Progress Notes (Signed)
   Subjective:    Patient ID: Ricky Meyers, male    DOB: [birth year], 72 y.o.   MRN: [id number]  HPI Here for 2 things. First he had the onset this morning of pain and decreased hearing in the left ear. No URI symptoms. Also he has had a rough spot on his scalp for several months.    Review of Systems  Constitutional: Negative.   HENT: Positive for ear pain and hearing loss. Negative for congestion, ear discharge, sinus pressure, sinus pain and sore throat.   Respiratory: Negative.        Objective:   Physical Exam  Constitutional: He appears well-developed and well-nourished.  HENT:  Right Ear: External ear normal.  Nose: Nose normal.  Mouth/Throat: Oropharynx is clear and moist.  Left TM is red , no effusion is seen   Eyes: Conjunctivae are normal.  Neck: No thyromegaly present.  Pulmonary/Chest: Effort normal and breath sounds normal.  Lymphadenopathy:    He has no cervical adenopathy.  Skin:  The frontal scalp has a raised rough lesion with normal pigmentation           Assessment & Plan:  Treat the otitis media with Augmentin. Refer to Dermatology to evaluate the skin lesion. This appears to be actinic.  Alysia Penna, MD

## 2018-07-09 ENCOUNTER — Other Ambulatory Visit: Payer: Self-pay | Admitting: Family Medicine

## 2018-07-15 ENCOUNTER — Ambulatory Visit (INDEPENDENT_AMBULATORY_CARE_PROVIDER_SITE_OTHER): Payer: Medicare Other | Admitting: Family Medicine

## 2018-07-15 ENCOUNTER — Encounter: Payer: Self-pay | Admitting: Family Medicine

## 2018-07-15 VITALS — BP 114/68 | HR 75 | Temp 97.8°F | Ht 69.0 in | Wt 258.8 lb

## 2018-07-15 DIAGNOSIS — M109 Gout, unspecified: Secondary | ICD-10-CM | POA: Diagnosis not present

## 2018-07-15 DIAGNOSIS — E782 Mixed hyperlipidemia: Secondary | ICD-10-CM

## 2018-07-15 DIAGNOSIS — K5900 Constipation, unspecified: Secondary | ICD-10-CM

## 2018-07-15 DIAGNOSIS — Z23 Encounter for immunization: Secondary | ICD-10-CM

## 2018-07-15 DIAGNOSIS — G4733 Obstructive sleep apnea (adult) (pediatric): Secondary | ICD-10-CM

## 2018-07-15 DIAGNOSIS — Z8546 Personal history of malignant neoplasm of prostate: Secondary | ICD-10-CM | POA: Diagnosis not present

## 2018-07-15 DIAGNOSIS — I10 Essential (primary) hypertension: Secondary | ICD-10-CM

## 2018-07-15 LAB — CBC WITH DIFFERENTIAL/PLATELET
BASOS PCT: 0.6 % (ref 0.0–3.0)
Basophils Absolute: 0 10*3/uL (ref 0.0–0.1)
EOS PCT: 5.2 % — AB (ref 0.0–5.0)
Eosinophils Absolute: 0.3 10*3/uL (ref 0.0–0.7)
HCT: 40.1 % (ref 39.0–52.0)
Hemoglobin: 13.5 g/dL (ref 13.0–17.0)
LYMPHS ABS: 1.5 10*3/uL (ref 0.7–4.0)
Lymphocytes Relative: 29.2 % (ref 12.0–46.0)
MCHC: 33.7 g/dL (ref 30.0–36.0)
MCV: 93.3 fl (ref 78.0–100.0)
MONO ABS: 0.8 10*3/uL (ref 0.1–1.0)
MONOS PCT: 15.8 % — AB (ref 3.0–12.0)
NEUTROS PCT: 49.2 % (ref 43.0–77.0)
Neutro Abs: 2.5 10*3/uL (ref 1.4–7.7)
PLATELETS: 165 10*3/uL (ref 150.0–400.0)
RBC: 4.29 Mil/uL (ref 4.22–5.81)
RDW: 15 % (ref 11.5–15.5)
WBC: 5 10*3/uL (ref 4.0–10.5)

## 2018-07-15 LAB — POC URINALSYSI DIPSTICK (AUTOMATED)
Bilirubin, UA: NEGATIVE
Blood, UA: NEGATIVE
Glucose, UA: NEGATIVE
KETONES UA: NEGATIVE
LEUKOCYTES UA: NEGATIVE
Nitrite, UA: NEGATIVE
PROTEIN UA: NEGATIVE
SPEC GRAV UA: 1.025 (ref 1.010–1.025)
Urobilinogen, UA: 0.2 E.U./dL
pH, UA: 5.5 (ref 5.0–8.0)

## 2018-07-15 LAB — BASIC METABOLIC PANEL
BUN: 15 mg/dL (ref 6–23)
CALCIUM: 9.6 mg/dL (ref 8.4–10.5)
CHLORIDE: 102 meq/L (ref 96–112)
CO2: 31 meq/L (ref 19–32)
CREATININE: 1 mg/dL (ref 0.40–1.50)
GFR: 77.54 mL/min (ref >60.00)
GLUCOSE: 97 mg/dL (ref 70–99)
Potassium: 4.5 mEq/L (ref 3.5–5.1)
SODIUM: 138 meq/L (ref 135–145)

## 2018-07-15 LAB — HEPATIC FUNCTION PANEL
ALT: 23 U/L (ref 0–53)
AST: 21 U/L (ref 0–37)
Albumin: 4.1 g/dL (ref 3.5–5.2)
Alkaline Phosphatase: 61 U/L (ref 39–117)
BILIRUBIN DIRECT: 0.1 mg/dL (ref 0.0–0.3)
BILIRUBIN TOTAL: 0.7 mg/dL (ref 0.2–1.2)
TOTAL PROTEIN: 6.5 g/dL (ref 6.0–8.3)

## 2018-07-15 LAB — LIPID PANEL
Cholesterol: 144 mg/dL (ref 0–200)
HDL: 59.3 mg/dL (ref >39.00)
LDL Cholesterol: 70 mg/dL (ref 0–99)
NonHDL: 84.41
Total CHOL/HDL Ratio: 2
Triglycerides: 74 mg/dL (ref 0.0–149.0)
VLDL: 14.8 mg/dL (ref 0.0–40.0)

## 2018-07-15 LAB — TSH: TSH: 2.01 u[IU]/mL (ref 0.35–4.50)

## 2018-07-15 LAB — PSA: PSA: 0.01 ng/mL — ABNORMAL LOW (ref 0.10–4.00)

## 2018-07-15 LAB — URIC ACID: URIC ACID, SERUM: 5.4 mg/dL (ref 4.0–7.8)

## 2018-07-15 NOTE — Progress Notes (Signed)
   Subjective:    Patient ID: Ricky Meyers, male    DOB: 11-Nov-1945, 72 y.o.   MRN: [id number]  HPI Here to follow up on issues. He was here recently for an otitis media but this cleared up nicely with treatment. In general he feels well. His BP at home is stable. He tries to watch his diet. His gout is well controlled. His depression is well controlled. He stays active, often working in his woods and cutting up trees.    Review of Systems  Constitutional: Negative.   HENT: Negative.   Eyes: Negative.   Respiratory: Negative.   Cardiovascular: Negative.   Gastrointestinal: Negative.   Genitourinary: Negative.   Musculoskeletal: Negative.   Skin: Negative.   Neurological: Negative.   Psychiatric/Behavioral: Negative.        Objective:   Physical Exam  Constitutional: He is oriented to person, place, and time. He appears well-developed and well-nourished. No distress.  HENT:  Head: Normocephalic and atraumatic.  Right Ear: External ear normal.  Left Ear: External ear normal.  Nose: Nose normal.  Mouth/Throat: Oropharynx is clear and moist. No oropharyngeal exudate.  Eyes: Pupils are equal, round, and reactive to light. Conjunctivae and EOM are normal. Right eye exhibits no discharge. Left eye exhibits no discharge. No scleral icterus.  Neck: Neck supple. No JVD present. No tracheal deviation present. No thyromegaly present.  Cardiovascular: Normal rate, regular rhythm, normal heart sounds and intact distal pulses. Exam reveals no gallop and no friction rub.  No murmur heard. Pulmonary/Chest: Effort normal and breath sounds normal. No respiratory distress. He has no wheezes. He has no rales. He exhibits no tenderness.  Abdominal: Soft. Bowel sounds are normal. He exhibits no distension and no mass. There is no tenderness. There is no rebound and no guarding.  Genitourinary: Rectum normal and penis normal. Rectal exam shows guaiac negative stool. No penile tenderness.    Genitourinary Comments: Prostate is surgically absent   Musculoskeletal: Normal range of motion. He exhibits no edema or tenderness.  Lymphadenopathy:    He has no cervical adenopathy.  Neurological: He is alert and oriented to person, place, and time. He has normal reflexes. He displays normal reflexes. No cranial nerve deficit. He exhibits normal muscle tone. Coordination normal.  Skin: Skin is warm and dry. No rash noted. He is not diaphoretic. No erythema. No pallor.  Psychiatric: He has a normal mood and affect. His behavior is normal. Judgment and thought content normal.          Assessment & Plan:  His HTN is stable. His depression is stable. We will get fasting labs today to check lipids, etc. His gout os stable.  Alysia Penna, MD

## 2018-07-18 ENCOUNTER — Encounter (HOSPITAL_COMMUNITY)
Admission: EM | Disposition: A | Discharge status: Home / Self Care | Payer: Self-pay | Source: Home / Self Care | Attending: Emergency Medicine

## 2018-07-18 ENCOUNTER — Emergency Department (HOSPITAL_COMMUNITY): Payer: Medicare Other

## 2018-07-18 ENCOUNTER — Emergency Department (HOSPITAL_COMMUNITY): Payer: Medicare Other | Admitting: Certified Registered Nurse Anesthetist

## 2018-07-18 ENCOUNTER — Encounter (HOSPITAL_COMMUNITY): Payer: Self-pay | Admitting: *Deleted

## 2018-07-18 ENCOUNTER — Ambulatory Visit (HOSPITAL_COMMUNITY)
Admission: EM | Admit: 2018-07-18 | Discharge: 2018-07-18 | Disposition: A | Discharge status: Home / Self Care | Payer: Medicare Other | Attending: Emergency Medicine | Admitting: Emergency Medicine

## 2018-07-18 DIAGNOSIS — S68119A Complete traumatic metacarpophalangeal amputation of unspecified finger, initial encounter: Secondary | ICD-10-CM | POA: Diagnosis present

## 2018-07-18 DIAGNOSIS — Z23 Encounter for immunization: Secondary | ICD-10-CM | POA: Insufficient documentation

## 2018-07-18 DIAGNOSIS — I447 Left bundle-branch block, unspecified: Secondary | ICD-10-CM | POA: Diagnosis not present

## 2018-07-18 DIAGNOSIS — I10 Essential (primary) hypertension: Secondary | ICD-10-CM | POA: Diagnosis not present

## 2018-07-18 DIAGNOSIS — G4733 Obstructive sleep apnea (adult) (pediatric): Secondary | ICD-10-CM | POA: Insufficient documentation

## 2018-07-18 DIAGNOSIS — Z8546 Personal history of malignant neoplasm of prostate: Secondary | ICD-10-CM | POA: Insufficient documentation

## 2018-07-18 DIAGNOSIS — S68625A Partial traumatic transphalangeal amputation of left ring finger, initial encounter: Secondary | ICD-10-CM | POA: Diagnosis not present

## 2018-07-18 DIAGNOSIS — M109 Gout, unspecified: Secondary | ICD-10-CM | POA: Diagnosis not present

## 2018-07-18 DIAGNOSIS — S68615A Complete traumatic transphalangeal amputation of left ring finger, initial encounter: Secondary | ICD-10-CM | POA: Insufficient documentation

## 2018-07-18 DIAGNOSIS — F329 Major depressive disorder, single episode, unspecified: Secondary | ICD-10-CM | POA: Insufficient documentation

## 2018-07-18 DIAGNOSIS — W3189XA Contact with other specified machinery, initial encounter: Secondary | ICD-10-CM | POA: Diagnosis not present

## 2018-07-18 DIAGNOSIS — K219 Gastro-esophageal reflux disease without esophagitis: Secondary | ICD-10-CM | POA: Diagnosis not present

## 2018-07-18 DIAGNOSIS — E785 Hyperlipidemia, unspecified: Secondary | ICD-10-CM | POA: Insufficient documentation

## 2018-07-18 DIAGNOSIS — Z79899 Other long term (current) drug therapy: Secondary | ICD-10-CM | POA: Insufficient documentation

## 2018-07-18 DIAGNOSIS — S68115A Complete traumatic metacarpophalangeal amputation of left ring finger, initial encounter: Secondary | ICD-10-CM | POA: Diagnosis not present

## 2018-07-18 DIAGNOSIS — G4730 Sleep apnea, unspecified: Secondary | ICD-10-CM | POA: Diagnosis not present

## 2018-07-18 DIAGNOSIS — S6992XA Unspecified injury of left wrist, hand and finger(s), initial encounter: Secondary | ICD-10-CM | POA: Diagnosis not present

## 2018-07-18 HISTORY — PX: AMPUTATION: SHX166

## 2018-07-18 SURGERY — AMPUTATION DIGIT
Anesthesia: General | Site: Finger | Laterality: Left

## 2018-07-18 MED ORDER — PHENYLEPHRINE HCL 10 MG/ML IJ SOLN
INTRAMUSCULAR | Status: DC | PRN
  Administered 2018-07-18: 80 ug via INTRAVENOUS

## 2018-07-18 MED ORDER — SULFAMETHOXAZOLE-TRIMETHOPRIM 800-160 MG PO TABS: 1.0000 | ORAL_TABLET | Freq: Two times a day (BID) | ORAL | 0 refills | Status: DC

## 2018-07-18 MED ORDER — LACTATED RINGERS IV SOLN
INTRAVENOUS | Status: DC | PRN
  Administered 2018-07-18: 16:00:00 via INTRAVENOUS

## 2018-07-18 MED ORDER — FENTANYL CITRATE (PF) 250 MCG/5ML IJ SOLN
INTRAMUSCULAR | Status: AC
  Filled 2018-07-18: qty 5

## 2018-07-18 MED ORDER — MIDAZOLAM HCL 2 MG/2ML IJ SOLN
INTRAMUSCULAR | Status: AC
  Filled 2018-07-18: qty 2

## 2018-07-18 MED ORDER — 0.9 % SODIUM CHLORIDE (POUR BTL) OPTIME
TOPICAL | Status: DC | PRN
  Administered 2018-07-18: 1000 mL

## 2018-07-18 MED ORDER — CEFAZOLIN SODIUM-DEXTROSE 2-4 GM/100ML-% IV SOLN
2.0000 g | Freq: Once | INTRAVENOUS | Status: AC
  Administered 2018-07-18: 2 g via INTRAVENOUS
  Filled 2018-07-18: qty 100

## 2018-07-18 MED ORDER — MIDAZOLAM HCL 2 MG/2ML IJ SOLN
INTRAMUSCULAR | Status: DC | PRN
  Administered 2018-07-18: 2 mg via INTRAVENOUS

## 2018-07-18 MED ORDER — PROPOFOL 10 MG/ML IV BOLUS
INTRAVENOUS | Status: AC
  Filled 2018-07-18: qty 20

## 2018-07-18 MED ORDER — SUCCINYLCHOLINE CHLORIDE 200 MG/10ML IV SOSY
PREFILLED_SYRINGE | INTRAVENOUS | Status: DC | PRN
  Administered 2018-07-18: 140 mg via INTRAVENOUS

## 2018-07-18 MED ORDER — LACTATED RINGERS IV SOLN: INTRAVENOUS | Status: DC

## 2018-07-18 MED ORDER — TETANUS-DIPHTH-ACELL PERTUSSIS 5-2.5-18.5 LF-MCG/0.5 IM SUSP
0.5000 mL | Freq: Once | INTRAMUSCULAR | Status: AC
  Administered 2018-07-18: 0.5 mL via INTRAMUSCULAR
  Filled 2018-07-18: qty 0.5

## 2018-07-18 MED ORDER — PROPOFOL 10 MG/ML IV BOLUS
INTRAVENOUS | Status: DC | PRN
  Administered 2018-07-18: 200 mg via INTRAVENOUS

## 2018-07-18 MED ORDER — LIDOCAINE 2% (20 MG/ML) 5 ML SYRINGE
INTRAMUSCULAR | Status: DC | PRN
  Administered 2018-07-18: 100 mg via INTRAVENOUS

## 2018-07-18 MED ORDER — HYDROCODONE-ACETAMINOPHEN 5-325 MG PO TABS: ORAL_TABLET | ORAL | 0 refills | Status: DC

## 2018-07-18 MED ORDER — BUPIVACAINE HCL 0.25 % IJ SOLN
INTRAMUSCULAR | Status: DC | PRN
  Administered 2018-07-18: 7 mL

## 2018-07-18 MED ORDER — ONDANSETRON HCL 4 MG/2ML IJ SOLN
INTRAMUSCULAR | Status: DC | PRN
  Administered 2018-07-18: 4 mg via INTRAVENOUS

## 2018-07-18 MED ORDER — BUPIVACAINE HCL (PF) 0.25 % IJ SOLN
INTRAMUSCULAR | Status: AC
  Filled 2018-07-18: qty 10

## 2018-07-18 MED ORDER — BUPIVACAINE HCL (PF) 0.5 % IJ SOLN
5.0000 mL | Freq: Once | INTRAMUSCULAR | Status: AC
  Administered 2018-07-18: 5 mL
  Filled 2018-07-18: qty 10

## 2018-07-18 MED ORDER — LIDOCAINE HCL (PF) 1 % IJ SOLN
5.0000 mL | Freq: Once | INTRAMUSCULAR | Status: AC
  Administered 2018-07-18: 5 mL
  Filled 2018-07-18: qty 5

## 2018-07-18 MED ORDER — FENTANYL CITRATE (PF) 250 MCG/5ML IJ SOLN
INTRAMUSCULAR | Status: DC | PRN
  Administered 2018-07-18: 125 ug via INTRAVENOUS
  Administered 2018-07-18: 50 ug via INTRAVENOUS
  Administered 2018-07-18 (×2): 25 ug via INTRAVENOUS

## 2018-07-18 MED ORDER — DEXAMETHASONE SODIUM PHOSPHATE 10 MG/ML IJ SOLN
INTRAMUSCULAR | Status: DC | PRN
  Administered 2018-07-18: 10 mg via INTRAVENOUS

## 2018-07-18 SURGICAL SUPPLY — 39 items
BLADE LONG MED 31MMX9MM (MISCELLANEOUS)
BLADE LONG MED 31X9 (MISCELLANEOUS) ×1 IMPLANT
BNDG CMPR 9X4 STRL LF SNTH (GAUZE/BANDAGES/DRESSINGS) ×1
BNDG COHESIVE 1X5 TAN STRL LF (GAUZE/BANDAGES/DRESSINGS) ×3 IMPLANT
BNDG ESMARK 4X9 LF (GAUZE/BANDAGES/DRESSINGS) ×3 IMPLANT
BNDG GAUZE ELAST 4 BULKY (GAUZE/BANDAGES/DRESSINGS) IMPLANT
CORDS BIPOLAR (ELECTRODE) ×3 IMPLANT
CUFF TOURNIQUET SINGLE 18IN (TOURNIQUET CUFF) ×3 IMPLANT
CUFF TOURNIQUET SINGLE 24IN (TOURNIQUET CUFF) IMPLANT
DURAPREP 26ML APPLICATOR (WOUND CARE) ×1 IMPLANT
GAUZE SPONGE 4X4 12PLY STRL (GAUZE/BANDAGES/DRESSINGS) ×2 IMPLANT
GAUZE SPONGE 4X4 12PLY STRL LF (GAUZE/BANDAGES/DRESSINGS) ×2 IMPLANT
GAUZE XEROFORM 1X8 LF (GAUZE/BANDAGES/DRESSINGS) ×3 IMPLANT
GLOVE BIO SURGEON STRL SZ7.5 (GLOVE) ×3 IMPLANT
GLOVE BIOGEL PI IND STRL 8 (GLOVE) ×1 IMPLANT
GLOVE BIOGEL PI INDICATOR 8 (GLOVE) ×2
GOWN STRL REUS W/ TWL LRG LVL3 (GOWN DISPOSABLE) ×1 IMPLANT
GOWN STRL REUS W/ TWL XL LVL3 (GOWN DISPOSABLE) ×1 IMPLANT
GOWN STRL REUS W/TWL LRG LVL3 (GOWN DISPOSABLE) ×3
GOWN STRL REUS W/TWL XL LVL3 (GOWN DISPOSABLE) ×3
KIT BASIN OR (CUSTOM PROCEDURE TRAY) ×3 IMPLANT
KIT TURNOVER KIT B (KITS) ×3 IMPLANT
NDL HYPO 25GX1X1/2 BEV (NEEDLE) IMPLANT
NEEDLE HYPO 25GX1X1/2 BEV (NEEDLE) ×3 IMPLANT
NS IRRIG 1000ML POUR BTL (IV SOLUTION) ×3 IMPLANT
PACK ORTHO EXTREMITY (CUSTOM PROCEDURE TRAY) ×3 IMPLANT
PAD ARMBOARD 7.5X6 YLW CONV (MISCELLANEOUS) ×6 IMPLANT
PAD CAST 4YDX4 CTTN HI CHSV (CAST SUPPLIES) IMPLANT
PADDING CAST COTTON 4X4 STRL (CAST SUPPLIES)
SPECIMEN JAR SMALL (MISCELLANEOUS) ×3 IMPLANT
SPLINT FINGER (SOFTGOODS) ×2 IMPLANT
SUCTION FRAZIER TIP 10 FR DISP (SUCTIONS) ×2 IMPLANT
SUT CHROMIC 6 0 PS 4 (SUTURE) ×2 IMPLANT
SUT MON AB 5-0 PS2 18 (SUTURE) ×2 IMPLANT
SUT VICRYL 4-0 PS2 18IN ABS (SUTURE) IMPLANT
SYR CONTROL 10ML LL (SYRINGE) ×2 IMPLANT
TOWEL OR 17X26 10 PK STRL BLUE (TOWEL DISPOSABLE) ×3 IMPLANT
TUBING BULK SUCTION (MISCELLANEOUS) ×2 IMPLANT
UNDERPAD 30X30 (UNDERPADS AND DIAPERS) ×3 IMPLANT

## 2018-07-18 NOTE — ED Triage Notes (Signed)
Pt in with injury to his left ring finger, distal tip cut off by log splitter, bleeding controlled, tetanus due

## 2018-07-18 NOTE — Anesthesia Preprocedure Evaluation (Signed)
Anesthesia Evaluation  Patient identified by MRN, date of birth, ID band Patient awake    Reviewed: Allergy & Precautions, NPO status , Patient's Chart, lab work & pertinent test results  Airway Mallampati: II  TM Distance: >3 FB Neck ROM: Full    Dental   Pulmonary sleep apnea ,    Pulmonary exam normal        Cardiovascular hypertension, Pt. on medications Normal cardiovascular exam     Neuro/Psych Depression    GI/Hepatic GERD  Medicated and Controlled,  Endo/Other    Renal/GU      Musculoskeletal   Abdominal   Peds  Hematology   Anesthesia Other Findings   Reproductive/Obstetrics                             Anesthesia Physical Anesthesia Plan  ASA: III  Anesthesia Plan: General   Post-op Pain Management:    Induction: Intravenous and Cricoid pressure planned  PONV Risk Score and Plan: 2  Airway Management Planned: Oral ETT  Additional Equipment:   Intra-op Plan:   Post-operative Plan: Extubation in OR  Informed Consent: I have reviewed the patients History and Physical, chart, labs and discussed the procedure including the risks, benefits and alternatives for the proposed anesthesia with the patient or authorized representative who has indicated his/her understanding and acceptance.     Plan Discussed with: CRNA and Surgeon  Anesthesia Plan Comments:         Anesthesia Quick Evaluation

## 2018-07-18 NOTE — Op Note (Signed)
NAME: LINKOLN ALKIRE MEDICAL RECORD NO: [id number] DATE OF BIRTH: Jan 11, 1946 FACILITY: [facility] LOCATION: [facility] OR PHYSICIAN: Tennis Must, MD   OPERATIVE REPORT   DATE OF PROCEDURE: 07/18/18    PREOPERATIVE DIAGNOSIS:   Left ring fingertip amputation   POSTOPERATIVE DIAGNOSIS:   Left ring fingertip amputation   PROCEDURE:   Revision amputation left ring finger through distal phalanx   SURGEON:  Leanora Cover, M.D.   ASSISTANT: none   ANESTHESIA:  General   INTRAVENOUS FLUIDS:  Per anesthesia flow sheet.   ESTIMATED BLOOD LOSS:  Minimal.   COMPLICATIONS:  None.   SPECIMENS:  none   TOURNIQUET TIME:    Total Tourniquet Time Documented: Upper Arm (Left) - 21 minutes Total: Upper Arm (Left) - 21 minutes    DISPOSITION:  Stable to PACU.   INDICATIONS: 72 year old male states he injured his left ring finger while using a log splitter today.  The tip of the finger was amputated.  Was seen at the emergency department.  I recommended revision amputation. Risks, benefits and alternatives of surgery were discussed including the risks of blood loss, infection, damage to nerves, vessels, tendons, ligaments, bone for surgery, need for additional surgery, complications with wound healing, continued pain, nonunion, malunion, stiffness.  He voiced understanding of these risks and elected to proceed.  OPERATIVE COURSE:  After being identified preoperatively by myself,  the patient and I agreed on the procedure and site of the procedure.  The surgical site was marked.  Surgical consent had been signed. He was given IV Ancef as preoperative antibiotic prophylaxis. He was transferred to the operating room and placed on the operating table in supine position with the Left upper extremity on an arm board.  General anesthesia was induced by the anesthesiologist.  Left upper extremity was prepped and draped in normal sterile orthopedic fashion.  A surgical pause was performed between the surgeons,  anesthesia, and operating room staff and all were in agreement as to the patient, procedure, and site of procedure.  Tourniquet at the proximal aspect of the extremity was inflated to 250 mmHg after exsanguination of the arm with an Esmarch bandage.    The nail is avulsed in the injury.  The wound was explored.  There is no gross contamination.  The bone was shortened with rongeurs to the non-damaged portion of the nailbed which also allowed mobilization of the subtenons tissues to come over the end of the bone.  The wound was copiously irrigated with sterile saline.  The subtenons tissues were repaired to the nailbed using 6-0 chromic suture.  The skin edges either side of the finger were repaired using 5-0 Monocryl suture in interrupted fashion after removal of the dogears.  Good contour of the finger was obtained with good bony coverage.  A piece of Xeroform was placed in the nail fold and the wound dressed with sterile Xeroform 4 x 4 and wrapped lightly with a Coban dressing.  AlumaFoam splint was placed and wrapped lightly with Coban dressing.  Digital block was performed with quarter percent plain Marcaine to aid in postoperative analgesia.  The tourniquet was deflated at 21 minutes.  Fingertips were pink with brisk capillary refill after deflation of tourniquet.  The operative  drapes were broken down.  The patient was awoken from anesthesia safely.  He was transferred back to the stretcher and taken to PACU in stable condition.  I will see him back in the office in 1 week for postoperative followup.  I  will give him a prescription for Norco 5/325 1-2 tabs PO q6 hours prn pain, dispense # 20 and Bactrim DS 1 p.o. twice daily x7 days.   Tennis Must, MD Electronically signed, 07/18/18

## 2018-07-18 NOTE — H&P (Signed)
Ricky Meyers is an 72 y.o. male.   Chief Complaint: left ring finger HPI: 72 yo rhd male present with wife states he amputated tip of left ring finger while using a log splitter earlier today.  Presented to [facility] ED.  Notes associated bleeding.  Pain alleviated with digital block and aggravated by palpation prior to block.  He reports no previous injury to left hand and no other injury at this time.  Case discussed with Kennith Maes, PAC and her note from 07/18/2018 reviewed. Xrays viewed and interpreted by me: 3 views left ring finger show amputation through tuft of distal phalanx Labs reviewed: none  Allergies: No Known Allergies  Past Medical History:  Diagnosis Date  . Arthritis    neck and back   . Chronic neck pain   . Depression   . ED (erectile dysfunction)   . GERD (gastroesophageal reflux disease)    dysphagia  . Gout    sees Dr. Leigh Aurora   . Hyperlipidemia   . Hypertension   . OSA (obstructive sleep apnea) 12/18/2017   Severe with AHI at 70.1/hr and is now on CPAP at [place]  . Prostate cancer [facility])    history of  . Sleep apnea    snoring/never checked    Past Surgical History:  Procedure Laterality Date  . CERVICAL FUSION  12/02/07   per Dr. Sherley Bounds  . COLONOSCOPY  03/11/2017   per Dr. Ardis Hughs, clear, no repeats needed   . injection lower back Right 09/17/2016  . PROSTATECTOMY    . TONSILLECTOMY    . VASECTOMY      Family History: Family History  Problem Relation Age of Onset  . Cancer Other        breast, porstate  . Hypertension Other   . Stroke Other   . Breast cancer Mother   . Diverticulitis Mother   . Heart disease Father   . Prostate cancer Father   . Stroke Father   . Dementia Sister   . Colon cancer Neg Hx     Social History:   reports that he has never smoked. He has never used smokeless tobacco. He reports that he drinks alcohol. He reports that he does not use drugs.  Medications:  (Not in a hospital admission)  No  results found for this or any previous visit (from the past 48 hour(s)).  Dg Finger Ring Left  Result Date: 07/18/2018 CLINICAL DATA:  Finger injury EXAM: LEFT RING FINGER 2+V COMPARISON:  None. FINDINGS: Soft tissue/osseous amputation of the 4th digit at the level of the distal tuft. The joint spaces are preserved. No radiopaque foreign body is seen. IMPRESSION: Soft tissue/osseous amputation of the 4th digit at the level of the distal tuft. Electronically Signed   By: Julian Hy M.D.   On: 07/18/2018 12:42     A comprehensive review of systems was negative. Review of Systems: No fevers, chills, night sweats, chest pain, shortness of breath, nausea, vomiting, diarrhea, constipation, easy bleeding or bruising, headaches, dizziness, vision changes, fainting.   Blood pressure 114/82, pulse 85, temperature 98.1 F (36.7 C), temperature source Oral, resp. rate 20, SpO2 95 %.  General appearance: alert, cooperative and appears stated age Head: Normocephalic, without obvious abnormality, atraumatic Neck: supple, symmetrical, trachea midline Resp: clear to auscultation bilaterally Cardio: regular rate and rhythm Extremities: Intact sensation and capillary refill all digits.  +epl/fpl/io.  Amputation left ring finger through nail. Pulses: 2+ and symmetric Skin: Skin color, texture,  turgor normal. No rashes or lesions Neurologic: Grossly normal Incision/Wound: As above  Assessment/Plan Left ring fingertip amputation.  Recommend revision amputation in OR.  Risks, benefits and alternatives of surgery were discussed including risks of blood loss, infection, damage to nerves/vessels/tendons/ligament/bone, failure of surgery, need for additional surgery, complication with wound healing, nail deformity, stiffness.  He voiced understanding of these risks and elected to proceed.    Serayah Yazdani R 07/18/2018, 3:32 PM

## 2018-07-18 NOTE — Transfer of Care (Signed)
Immediate Anesthesia Transfer of Care Note  Patient: Ricky Meyers  Procedure(s) Performed: LEFT RING FINGER REVISION AMPUTATION (Left Finger)  Patient Location: PACU  Anesthesia Type:General  Level of Consciousness: awake, alert  and oriented  Airway & Oxygen Therapy: Patient Spontanous Breathing  Post-op Assessment: Report given to RN and Post -op Vital signs reviewed and stable  Post vital signs: Reviewed and stable  Last Vitals:  Vitals Value Taken Time  BP 141/85 07/18/2018  5:27 PM  Temp    Pulse 93 07/18/2018  5:28 PM  Resp 17 07/18/2018  5:28 PM  SpO2 90 % 07/18/2018  5:28 PM  Vitals shown include unvalidated device data.  Last Pain:  Vitals:   07/18/18 1556  TempSrc:   PainSc: 6          Complications: No apparent anesthesia complications

## 2018-07-18 NOTE — Anesthesia Procedure Notes (Signed)
Procedure Name: Intubation Date/Time: 07/18/2018 4:35 PM Performed by: Clearnce Sorrel, CRNA Pre-anesthesia Checklist: Patient identified, Emergency Drugs available, Suction available, Patient being monitored and Timeout performed Patient Re-evaluated:Patient Re-evaluated prior to induction Oxygen Delivery Method: Circle system utilized Preoxygenation: Pre-oxygenation with 100% oxygen Induction Type: IV induction, Rapid sequence and Cricoid Pressure applied Laryngoscope Size: Mac and 4 Grade View: Grade I Tube type: Oral Tube size: 7.5 mm Number of attempts: 1 Airway Equipment and Method: Stylet Placement Confirmation: ETT inserted through vocal cords under direct vision,  positive ETCO2 and breath sounds checked- equal and bilateral Secured at: 23 cm Tube secured with: Tape Dental Injury: Teeth and Oropharynx as per pre-operative assessment

## 2018-07-18 NOTE — Discharge Instructions (Addendum)

## 2018-07-18 NOTE — ED Provider Notes (Addendum)
[facility] EMERGENCY DEPARTMENT Provider Note   Ricky Meyers: [id number] Arrival date & time: 07/18/18  1209   History   Chief Complaint Chief Complaint  Patient presents with  . Finger Injury    HPI VARDAAN DEPASCALE is a 72 y.o. male with history of OSA, depression, GERD, hypertension, and hyperlipidemia who presents to the emergency department for left ring finger distal amputation which occurred at 1230 this afternoon.  Patient states that he was using a log splitter and accidentally cut off the tip of his finger.  He states the area is painful, current pain is a 5 out of 10 in severity, he did wash the area with water which she states worsened his pain.  No other specific alleviating or aggravating factors.  No other notable areas of injury.  He is right-hand dominant.  His last tetanus was 9 years ago. Last PO intake was 10:00 this AM, drank a beer. Today in total has consumed 2 glasses of water, 1 coffee, and 2 beers, no food.   HPI  Past Medical History:  Diagnosis Date  . Arthritis    neck and back   . Chronic neck pain   . Depression   . ED (erectile dysfunction)   . GERD (gastroesophageal reflux disease)    dysphagia  . Gout    sees Dr. Leigh Aurora   . Hyperlipidemia   . Hypertension   . OSA (obstructive sleep apnea) 12/18/2017   Severe with AHI at 70.1/hr and is now on CPAP at [place]  . Prostate cancer [facility])    history of  . Sleep apnea    snoring/never checked    Patient Active Problem List   Diagnosis Date Noted  . OSA (obstructive sleep apnea) 12/18/2017  . Chest pain 06/17/2017  . Obesity (BMI 30.0-34.9) 06/17/2017  . Snoring 06/17/2017  . LBBB (left bundle branch block) 10/22/2016  . DOE (dyspnea on exertion) 10/22/2016  . Ectopic beat, ventricular 10/22/2016  . Anemia, iron deficiency 12/03/2014  . Fever 12/02/2014  . Pseudogout of elbow 12/02/2014  . Arthritis 12/02/2014  . Hyponatremia 12/02/2014  . Constipation 12/02/2014  .  Suspected Septic arthritis 12/02/2014  . Nontraumatic pain and swelling of elbow   . GROIN PAIN 10/01/2010  . Depression 03/04/2008  . SCARLET FEVER 06/30/2007  . Hyperlipemia 06/30/2007  . Gout 06/30/2007  . Essential hypertension 06/30/2007  . NECK PAIN 06/30/2007  . PROSTATE CANCER, HX OF 06/30/2007    Past Surgical History:  Procedure Laterality Date  . CERVICAL FUSION  12/02/07   per Dr. Sherley Bounds  . COLONOSCOPY  03/11/2017   per Dr. Ardis Hughs, clear, no repeats needed   . injection lower back Right 09/17/2016  . PROSTATECTOMY    . TONSILLECTOMY    . VASECTOMY          Home Medications    Prior to Admission medications   Medication Sig Start Date End Date Taking? Authorizing Provider  allopurinol (ZYLOPRIM) 300 MG tablet TAKE 1 TABLET BY MOUTH EVERY DAY 06/10/18   [name], MD  amLODipine (NORVASC) 5 MG tablet TAKE 1 TABLET BY MOUTH EVERY DAY 02/06/18   [name], MD  atorvastatin (LIPITOR) 40 MG tablet TAKE 1 TABLET BY MOUTH EVERY DAY 02/06/18   [name], MD  cyclobenzaprine (FLEXERIL) 10 MG tablet TAKE ONE TABLET BY MOUTH THREE TIMES DAILY AS NEEDED FOR MUSCLE SPASMS 04/14/18   [name], MD  gabapentin (NEURONTIN) 100 MG capsule  TAKE 1 CAPSULE BY MOUTH 2 TIMES DAILY 07/09/18   [name], MD  lisinopril-hydrochlorothiazide (PRINZIDE,ZESTORETIC) 20-25 MG tablet TAKE 1 TABLET BY MOUTH EVERY DAY 06/02/18   [name], MD  naproxen (NAPROSYN) 500 MG tablet Take 1 tablet (500 mg total) by mouth 2 (two) times daily with a meal. 06/10/18   [name], MD  Oxycodone HCl 10 MG TABS Take 1 tablet (10 mg total) by mouth every 6 (six) hours as needed (pain). 08/10/18 09/09/18  [name], MD  PARoxetine (PAXIL) 20 MG tablet TAKE 1 TABLET BY MOUTH EVERY MORNING 07/09/18   [name], MD  psyllium (METAMUCIL) 58.6 % packet Take 1 packet by mouth daily as needed (for constipation).     [provider]    Family History Family History  Problem  Relation Age of Onset  . Cancer Other        breast, porstate  . Hypertension Other   . Stroke Other   . Breast cancer Mother   . Diverticulitis Mother   . Heart disease Father   . Prostate cancer Father   . Stroke Father   . Dementia Sister   . Colon cancer Neg Hx     Social History Social History   Tobacco Use  . Smoking status: Never Smoker  . Smokeless tobacco: Never Used  Substance Use Topics  . Alcohol use: Yes    Alcohol/week: 0.0 standard drinks    Comment: occ  . Drug use: No     Allergies   Patient has no known allergies.   Review of Systems Review of Systems  Constitutional: Negative for chills and fever.  Skin: Positive for wound.  Neurological: Negative for weakness.  All other systems reviewed and are negative.    Physical Exam Updated Vital Signs BP 114/82 (BP Location: Right Arm)   Pulse 85   Temp 98.1 F (36.7 C) (Oral)   Resp 20   SpO2 95%   Physical Exam  Constitutional: He appears well-developed and well-nourished. No distress.  HENT:  Head: Normocephalic and atraumatic.  Eyes: Conjunctivae are normal. Right eye exhibits no discharge. Left eye exhibits no discharge.  Cardiovascular:  2+ symmetric radial pulses.  Musculoskeletal:  Please see skin exam.  Patient has normal range of motion to bilateral upper extremities including  interphalangeal joints and MCPs with the exception of some mild DIP/PIP flexion limitation in the L 3rd and 4th digits, but able to perform majority. Tender to palpation over open wound to L 4th finger. Otherwise nontender.   Neurological: He is alert.  Clear speech.  Sensation grossly intact bilateral upper extremities.  5 out of 5 symmetric grip strength.  Skin:  Patient has an amputation of the distal aspect of the L 4th finger through nail bed.   Psychiatric: He has a normal mood and affect. His behavior is normal. Thought content normal.  Nursing note and vitals reviewed.              ED  Treatments / Results  Labs (all labs ordered are listed, but only abnormal results are displayed) Labs Reviewed - No data to display  EKG None  Radiology Dg Finger Ring Left  Result Date: 07/18/2018 CLINICAL DATA:  Finger injury EXAM: LEFT RING FINGER 2+V COMPARISON:  None. FINDINGS: Soft tissue/osseous amputation of the 4th digit at the level of the distal tuft. The joint spaces are preserved. No radiopaque foreign body is seen. IMPRESSION: Soft tissue/osseous amputation of the  4th digit at the level of the distal tuft. Electronically Signed   By: Julian Hy M.D.   On: 07/18/2018 12:42    Procedures Procedures (including critical care time)  NERVE BLOCK Performed by: Kennith Maes Consent: Verbal consent obtained. Risks/benefits/altenative discussed Indication: Distal finger amputation.   Nerve block body site: L 4th finger  Preparation: Alcohol swab, betadine Needle gauge: 27 G Location technique: Anatomical landmarks  Local anesthetic: Utilized Marcaine 0.5% and Lidocaine 1%- 50/50 mixed.   Anesthetic total: 6 ml total of mixture.  Outcome: Pain improved, anesthesia acheived Patient tolerance: Patient tolerated the procedure well with no immediate complications.  . Medications Ordered in ED Medications  bupivacaine (MARCAINE) 0.5 % injection 5 mL (5 mLs Infiltration Given 07/18/18 1320)  Tdap (BOOSTRIX) injection 0.5 mL (0.5 mLs Intramuscular Given 07/18/18 1320)  lidocaine (PF) (XYLOCAINE) 1 % injection 5 mL (5 mLs Infiltration Given 07/18/18 1320)    Initial Impression / Assessment and Plan / ED Course  I have reviewed the triage vital signs and the nursing notes.  Pertinent labs & imaging results that were available during my care of the patient were reviewed by me and considered in my medical decision making (see chart for details).   Patient presents to the emergency department with left fourth finger osseous/soft tissue amputation on exam and  confirmed on x-ray.  Appears to be neurovascularly intact. Digital block performed per procedure note above. Tourniquet applied, pressure irrigated with sterile water, tourniquet subsequently removed. Images as above. Tetanus updated. Will discuss with hand surgery.   14:51: CONSULT: Discussed with hand surgeon Dr. Fredna Dow- plan to take patient to the OR, diet NPO, Ancef.   Plan carried out as above. Patient and his fiance at bedside updated on plan, provided opportunity for questions, they confirmed understanding and are in agreement.   Findings and plan of care discussed with supervising physician Dr. Sherry Ruffing who personally evaluated patient, in agreement.    Final Clinical Impressions(s) / ED Diagnoses   Final diagnoses:  Traumatic amputation of finger, initial encounter    ED Discharge Orders    None       Amaryllis Dyke, PA-C 07/18/18 1553    Amaryllis Dyke, PA-C 07/18/18 1624    Tegeler, Gwenyth Allegra, MD 07/18/18 [phone]

## 2018-07-19 ENCOUNTER — Encounter (HOSPITAL_COMMUNITY): Payer: Self-pay | Admitting: Orthopedic Surgery

## 2018-07-19 NOTE — Anesthesia Postprocedure Evaluation (Signed)
Anesthesia Post Note  Patient: Ricky Meyers  Procedure(s) Performed: LEFT RING FINGER REVISION AMPUTATION (Left Finger)     Patient location during evaluation: PACU Anesthesia Type: General Level of consciousness: awake and alert Pain management: pain level controlled Vital Signs Assessment: post-procedure vital signs reviewed and stable Respiratory status: spontaneous breathing, nonlabored ventilation, respiratory function stable and patient connected to nasal cannula oxygen Cardiovascular status: blood pressure returned to baseline and stable Postop Assessment: no apparent nausea or vomiting Anesthetic complications: no    Last Vitals:  Vitals:   07/18/18 1822 07/18/18 1826  BP:  (!) 132/96  Pulse: 74 77  Resp: 16 13  Temp:  36.6 C  SpO2: 97% 97%    Last Pain:  Vitals:   07/18/18 1826  TempSrc:   PainSc: 0-No pain                 Alaiah Lundy DAVID

## 2018-08-04 DIAGNOSIS — L570 Actinic keratosis: Secondary | ICD-10-CM | POA: Diagnosis not present

## 2018-08-04 DIAGNOSIS — L820 Inflamed seborrheic keratosis: Secondary | ICD-10-CM | POA: Diagnosis not present

## 2018-08-04 DIAGNOSIS — L814 Other melanin hyperpigmentation: Secondary | ICD-10-CM | POA: Diagnosis not present

## 2018-09-03 ENCOUNTER — Telehealth: Payer: Self-pay | Admitting: Family Medicine

## 2018-09-03 NOTE — Telephone Encounter (Signed)
Copied from [place] [id number]. Topic: Quick Communication - See Telephone Encounter >> Sep 03, 2018  9:36 AM Conception Chancy, NT wrote: CRM for notification. See Telephone encounter for: 09/03/18.  Patient states he is going on vacation 09/05/18 and states his Oxycodone HCl 10 MG TABS is not ready for a refill until 09/10/18. He states he has a 3 month supply at his pharmacy but would like his refill for next Thursday to be canceled and in place get a paper script so he can take it with him on vacation and get it refilled. Please advise.

## 2018-09-03 NOTE — Telephone Encounter (Signed)
Dr Fry please advise. thanks 

## 2018-09-04 ENCOUNTER — Other Ambulatory Visit: Payer: Self-pay

## 2018-09-04 MED ORDER — OXYCODONE HCL 10 MG PO TABS: 10.0000 mg | ORAL_TABLET | Freq: Four times a day (QID) | ORAL | 0 refills | Status: AC | PRN

## 2018-09-04 NOTE — Telephone Encounter (Signed)
Pt states that he is leaving town in the morning and needs to know about this medication request for a written script. Pt can be reached at [phone].

## 2018-09-04 NOTE — Telephone Encounter (Signed)
Patient calling to check on the status of getting this paper script. States that he is leaving at 6am in the morning for a fishing trip to the [employer]. States that he lives 30 mins from the office and would like to get this paper script before the office closes today. Please advise.

## 2018-09-04 NOTE — Telephone Encounter (Signed)
This is ready to pick up now

## 2018-09-04 NOTE — Telephone Encounter (Signed)
Noted. Pt notified.  

## 2018-09-04 NOTE — Telephone Encounter (Signed)
Patient called back stating that he is leaving at 6 am on 09/05/18 for a yearly fishing tournament and would like to pick up this Rx before the office closes today please. Ph# [phone]

## 2018-09-22 DIAGNOSIS — Z85828 Personal history of other malignant neoplasm of skin: Secondary | ICD-10-CM | POA: Diagnosis not present

## 2018-09-22 DIAGNOSIS — L570 Actinic keratosis: Secondary | ICD-10-CM | POA: Diagnosis not present

## 2018-09-22 DIAGNOSIS — L821 Other seborrheic keratosis: Secondary | ICD-10-CM | POA: Diagnosis not present

## 2018-09-22 DIAGNOSIS — L814 Other melanin hyperpigmentation: Secondary | ICD-10-CM | POA: Diagnosis not present

## 2018-10-06 ENCOUNTER — Other Ambulatory Visit: Payer: Self-pay | Admitting: Family Medicine

## 2018-10-09 NOTE — Telephone Encounter (Signed)
This must be part of a pain management visit

## 2018-10-12 ENCOUNTER — Other Ambulatory Visit: Payer: Self-pay | Admitting: Family Medicine

## 2018-10-12 NOTE — Telephone Encounter (Signed)
Pt called in to follow up on refill request. Pt is scheduled to come in on 10/14/18, pt says that he is completely out of his medication.

## 2018-10-13 NOTE — Telephone Encounter (Signed)
Patient is scheduled for pain management on 12/11  Ok to fill?

## 2018-10-14 ENCOUNTER — Ambulatory Visit (INDEPENDENT_AMBULATORY_CARE_PROVIDER_SITE_OTHER): Payer: Medicare Other | Admitting: Family Medicine

## 2018-10-14 ENCOUNTER — Encounter: Payer: Self-pay | Admitting: Family Medicine

## 2018-10-14 VITALS — BP 124/80 | HR 77 | Temp 98.0°F | Wt 255.1 lb

## 2018-10-14 DIAGNOSIS — M1990 Unspecified osteoarthritis, unspecified site: Secondary | ICD-10-CM | POA: Diagnosis not present

## 2018-10-14 DIAGNOSIS — R29898 Other symptoms and signs involving the musculoskeletal system: Secondary | ICD-10-CM

## 2018-10-14 DIAGNOSIS — M159 Polyosteoarthritis, unspecified: Secondary | ICD-10-CM | POA: Diagnosis not present

## 2018-10-14 DIAGNOSIS — M25429 Effusion, unspecified elbow: Secondary | ICD-10-CM

## 2018-10-14 DIAGNOSIS — F1190 Opioid use, unspecified, uncomplicated: Secondary | ICD-10-CM

## 2018-10-14 MED ORDER — OXYCODONE-ACETAMINOPHEN 10-325 MG PO TABS: 1.0000 | ORAL_TABLET | Freq: Four times a day (QID) | ORAL | 0 refills | Status: DC | PRN

## 2018-10-14 NOTE — Progress Notes (Signed)
   Subjective:    Patient ID: Ricky Meyers, male    DOB: 1946/06/26, 72 y.o.   MRN: [id number]  HPI Here for pain management. He is doing well. Last time we added Naproxen to his regimen, and this has been very helpful.  Indication for chronic opioid: osteoarthritis  Medication and dose: Oxycodone 10 mg  # pills per month: 120 Last UDS date: 10-16-18 Opioid Treatment Agreement signed (Y/N): 03-13-18 Opioid Treatment Agreement last reviewed with patient:  10-14-18 NCCSRS reviewed this encounter (include red flags):   10-14-18   Review of Systems  Constitutional: Negative.   Respiratory: Negative.   Cardiovascular: Negative.   Musculoskeletal: Positive for arthralgias.  Neurological: Negative.        Objective:   Physical Exam  Constitutional: He is oriented to person, place, and time. He appears well-developed and well-nourished.  Cardiovascular: Normal rate, regular rhythm, normal heart sounds and intact distal pulses.  Pulmonary/Chest: Effort normal and breath sounds normal.  Neurological: He is alert and oriented to person, place, and time.          Assessment & Plan:  Pain management, meds were refilled.  Alysia Penna, MD

## 2018-10-14 NOTE — Telephone Encounter (Signed)
We will see him today for this

## 2018-10-16 LAB — PAIN MGMT, PROFILE 8 W/CONF, U
6 Acetylmorphine: NEGATIVE ng/mL (ref <10)
Alcohol Metabolites: POSITIVE ng/mL — AB (ref <500)
Amphetamines: NEGATIVE ng/mL (ref <500)
BUPRENORPHINE, URINE: NEGATIVE ng/mL (ref <5)
Benzodiazepines: NEGATIVE ng/mL (ref <100)
CREATININE: 81.8 mg/dL
Cocaine Metabolite: NEGATIVE ng/mL (ref <150)
ETHYL GLUCURONIDE (ETG): 3448 ng/mL — AB (ref <500)
Ethyl Sulfate (ETS): 850 ng/mL — ABNORMAL HIGH (ref <100)
MDMA: NEGATIVE ng/mL (ref <500)
Marijuana Metabolite: NEGATIVE ng/mL (ref <20)
OPIATES: NEGATIVE ng/mL (ref <100)
Oxidant: NEGATIVE ug/mL (ref <200)
Oxycodone: NEGATIVE ng/mL (ref <100)
pH: 6.51 (ref 4.5–9.0)

## 2018-11-09 ENCOUNTER — Other Ambulatory Visit: Payer: Self-pay | Admitting: Family Medicine

## 2018-11-28 ENCOUNTER — Other Ambulatory Visit: Payer: Self-pay | Admitting: Family Medicine

## 2019-01-11 ENCOUNTER — Ambulatory Visit (INDEPENDENT_AMBULATORY_CARE_PROVIDER_SITE_OTHER): Payer: Medicare Other | Admitting: Family Medicine

## 2019-01-11 ENCOUNTER — Encounter: Payer: Self-pay | Admitting: Family Medicine

## 2019-01-11 VITALS — BP 130/72 | HR 70 | Temp 98.6°F | Wt 232.1 lb

## 2019-01-11 DIAGNOSIS — F1190 Opioid use, unspecified, uncomplicated: Secondary | ICD-10-CM | POA: Diagnosis not present

## 2019-01-11 DIAGNOSIS — M159 Polyosteoarthritis, unspecified: Secondary | ICD-10-CM

## 2019-01-11 MED ORDER — OXYCODONE-ACETAMINOPHEN 10-325 MG PO TABS: 1.0000 | ORAL_TABLET | Freq: Four times a day (QID) | ORAL | 0 refills | Status: AC | PRN

## 2019-01-11 MED ORDER — OXYCODONE-ACETAMINOPHEN 10-325 MG PO TABS: 1.0000 | ORAL_TABLET | Freq: Four times a day (QID) | ORAL | 0 refills | Status: DC | PRN

## 2019-01-11 NOTE — Progress Notes (Signed)
   Subjective:    Patient ID: Ricky Meyers, male    DOB: 09/16/1946, 73 y.o.   MRN: [id number]  HPI Here for pain management. He is doing well.  Indication for chronic opioid: osteoarthritis  Medication and dose: Oxycodone 10 mg  # pills per month: 120 Last UDS date: 10-16-18 Opioid Treatment Agreement signed (Y/N): 01-11-19 Opioid Treatment Agreement last reviewed with patient:  01-11-19 NCCSRS reviewed this encounter (include red flags):  01-11-19    Review of Systems  Constitutional: Negative.   Respiratory: Negative.   Cardiovascular: Negative.   Musculoskeletal: Positive for arthralgias.       Objective:   Physical Exam Constitutional:      Appearance: Normal appearance.  Cardiovascular:     Rate and Rhythm: Normal rate and regular rhythm.     Pulses: Normal pulses.     Heart sounds: Normal heart sounds.  Pulmonary:     Effort: Pulmonary effort is normal.     Breath sounds: Normal breath sounds.  Neurological:     Mental Status: He is alert.           Assessment & Plan:  Pain management, meds were refilled.  Alysia Penna, MD

## 2019-01-21 ENCOUNTER — Other Ambulatory Visit: Payer: Self-pay | Admitting: Family Medicine

## 2019-02-10 ENCOUNTER — Other Ambulatory Visit: Payer: Self-pay | Admitting: Family Medicine

## 2019-02-24 ENCOUNTER — Other Ambulatory Visit: Payer: Self-pay | Admitting: Family Medicine

## 2019-04-13 ENCOUNTER — Other Ambulatory Visit: Payer: Self-pay | Admitting: Family Medicine

## 2019-04-15 ENCOUNTER — Encounter: Payer: Self-pay | Admitting: Family Medicine

## 2019-04-15 MED ORDER — OXYCODONE-ACETAMINOPHEN 10-325 MG PO TABS: 1.0000 | ORAL_TABLET | ORAL | 0 refills | Status: DC | PRN

## 2019-04-15 NOTE — Telephone Encounter (Signed)
Dr. Fry please advise. Thanks  

## 2019-04-15 NOTE — Telephone Encounter (Signed)
I could only send in #30, which I did. He needs a PMV (this can be by Doxy of course)

## 2019-04-19 ENCOUNTER — Encounter: Payer: Self-pay | Admitting: Family Medicine

## 2019-04-19 ENCOUNTER — Other Ambulatory Visit: Payer: Self-pay

## 2019-04-19 ENCOUNTER — Ambulatory Visit (INDEPENDENT_AMBULATORY_CARE_PROVIDER_SITE_OTHER): Payer: Medicare Other | Admitting: Family Medicine

## 2019-04-19 DIAGNOSIS — F1190 Opioid use, unspecified, uncomplicated: Secondary | ICD-10-CM

## 2019-04-19 DIAGNOSIS — M159 Polyosteoarthritis, unspecified: Secondary | ICD-10-CM | POA: Diagnosis not present

## 2019-04-19 MED ORDER — OXYCODONE-ACETAMINOPHEN 10-325 MG PO TABS: 1.0000 | ORAL_TABLET | Freq: Four times a day (QID) | ORAL | 0 refills | Status: DC | PRN

## 2019-04-19 MED ORDER — OXYCODONE-ACETAMINOPHEN 10-325 MG PO TABS: 1.0000 | ORAL_TABLET | Freq: Four times a day (QID) | ORAL | 0 refills | Status: AC | PRN

## 2019-04-19 NOTE — Addendum Note (Signed)
Addended by: Alysia Penna A on: 04/19/2019 09:57 AM   Modules accepted: Orders

## 2019-04-19 NOTE — Progress Notes (Signed)
   Subjective:    Patient ID: Ricky Meyers, male    DOB: [birth year], 73 y.o.   MRN: [id number]  HPI Virtual Visit via Telephone Note  I connected with the patient on 04/19/19 at  9:30 AM EDT by telephone and verified that I am speaking with the correct person using two identifiers. We attempted to connect virtually but we had technical difficulties with the audio and video.     I discussed the limitations, risks, security and privacy concerns of performing an evaluation and management service by telephone and the availability of in person appointments. I also discussed with the patient that there may be a patient responsible charge related to this service. The patient expressed understanding and agreed to proceed.  Location patient: home Location provider: work or home office Participants present for the call: patient, provider Patient did not have a visit in the prior 7 days to address this/these issue(s).   History of Present Illness: Here for pain management. He is doing well.  Indication for chronic opioid: osteoarthritis Medication and dose: Percocet 10-325 # pills per month: 120 Last UDS date: 10-23-18 Opioid Treatment Agreement signed (Y/N): 01-15-19 Opioid Treatment Agreement last reviewed with patient:  04-19-19 [place] reviewed this encounter (include red flags):  04-19-19    Observations/Objective: Patient sounds cheerful and well on the phone. I do not appreciate any SOB. Speech and thought processing are grossly intact. Patient reported vitals:  Assessment and Plan: Pain management, meds were refilled.  Alysia Penna, MD   Follow Up Instructions:     [phone] 5-10 [phone] 11-20 [id number] 21-30 I did not refer this patient for an OV in the next 24 hours for this/these issue(s).  I discussed the assessment and treatment plan with the patient. The patient was provided an opportunity to ask questions and all were answered. The patient agreed with the plan and demonstrated  an understanding of the instructions.   The patient was advised to call back or seek an in-person evaluation if the symptoms worsen or if the condition fails to improve as anticipated.  I provided 10 minutes of non-face-to-face time during this encounter.   Alysia Penna, MD    Review of Systems     Objective:   Physical Exam        Assessment & Plan:

## 2019-04-20 ENCOUNTER — Other Ambulatory Visit: Payer: Self-pay | Admitting: Family Medicine

## 2019-04-22 NOTE — Telephone Encounter (Signed)
Dr. Fry please advise on refill. Thanks  

## 2019-05-03 ENCOUNTER — Other Ambulatory Visit: Payer: Self-pay | Admitting: Family Medicine

## 2019-05-18 ENCOUNTER — Other Ambulatory Visit: Payer: Self-pay | Admitting: Family Medicine

## 2019-06-01 ENCOUNTER — Other Ambulatory Visit: Payer: Self-pay | Admitting: Family Medicine

## 2019-06-05 DIAGNOSIS — I2699 Other pulmonary embolism without acute cor pulmonale: Secondary | ICD-10-CM

## 2019-06-05 HISTORY — DX: Other pulmonary embolism without acute cor pulmonale: I26.99

## 2019-06-15 ENCOUNTER — Emergency Department (HOSPITAL_COMMUNITY): Payer: Medicare Other

## 2019-06-15 ENCOUNTER — Encounter (HOSPITAL_COMMUNITY): Payer: Self-pay | Admitting: Emergency Medicine

## 2019-06-15 ENCOUNTER — Other Ambulatory Visit: Payer: Self-pay

## 2019-06-15 ENCOUNTER — Inpatient Hospital Stay (HOSPITAL_COMMUNITY)
Admission: EM | Admit: 2019-06-15 | Discharge: 2019-06-18 | DRG: 175 | Disposition: A | Discharge status: Home / Self Care | Payer: Medicare Other | Attending: Internal Medicine | Admitting: Internal Medicine

## 2019-06-15 ENCOUNTER — Encounter: Payer: Self-pay | Admitting: Family Medicine

## 2019-06-15 DIAGNOSIS — E669 Obesity, unspecified: Secondary | ICD-10-CM | POA: Diagnosis present

## 2019-06-15 DIAGNOSIS — F329 Major depressive disorder, single episode, unspecified: Secondary | ICD-10-CM | POA: Diagnosis present

## 2019-06-15 DIAGNOSIS — Z823 Family history of stroke: Secondary | ICD-10-CM | POA: Diagnosis not present

## 2019-06-15 DIAGNOSIS — E782 Mixed hyperlipidemia: Secondary | ICD-10-CM | POA: Diagnosis not present

## 2019-06-15 DIAGNOSIS — Z6830 Body mass index (BMI) 30.0-30.9, adult: Secondary | ICD-10-CM | POA: Diagnosis not present

## 2019-06-15 DIAGNOSIS — Z8249 Family history of ischemic heart disease and other diseases of the circulatory system: Secondary | ICD-10-CM

## 2019-06-15 DIAGNOSIS — R0600 Dyspnea, unspecified: Secondary | ICD-10-CM | POA: Diagnosis not present

## 2019-06-15 DIAGNOSIS — I2694 Multiple subsegmental pulmonary emboli without acute cor pulmonale: Secondary | ICD-10-CM | POA: Diagnosis not present

## 2019-06-15 DIAGNOSIS — G4733 Obstructive sleep apnea (adult) (pediatric): Secondary | ICD-10-CM | POA: Diagnosis present

## 2019-06-15 DIAGNOSIS — I2609 Other pulmonary embolism with acute cor pulmonale: Principal | ICD-10-CM | POA: Diagnosis present

## 2019-06-15 DIAGNOSIS — R079 Chest pain, unspecified: Secondary | ICD-10-CM | POA: Diagnosis not present

## 2019-06-15 DIAGNOSIS — I340 Nonrheumatic mitral (valve) insufficiency: Secondary | ICD-10-CM | POA: Diagnosis not present

## 2019-06-15 DIAGNOSIS — I2699 Other pulmonary embolism without acute cor pulmonale: Secondary | ICD-10-CM | POA: Diagnosis not present

## 2019-06-15 DIAGNOSIS — I454 Nonspecific intraventricular block: Secondary | ICD-10-CM | POA: Diagnosis present

## 2019-06-15 DIAGNOSIS — Z86711 Personal history of pulmonary embolism: Secondary | ICD-10-CM | POA: Diagnosis present

## 2019-06-15 DIAGNOSIS — Z20828 Contact with and (suspected) exposure to other viral communicable diseases: Secondary | ICD-10-CM | POA: Diagnosis present

## 2019-06-15 DIAGNOSIS — I10 Essential (primary) hypertension: Secondary | ICD-10-CM | POA: Diagnosis present

## 2019-06-15 DIAGNOSIS — G8929 Other chronic pain: Secondary | ICD-10-CM | POA: Diagnosis present

## 2019-06-15 DIAGNOSIS — M109 Gout, unspecified: Secondary | ICD-10-CM | POA: Diagnosis present

## 2019-06-15 DIAGNOSIS — R0602 Shortness of breath: Secondary | ICD-10-CM | POA: Diagnosis not present

## 2019-06-15 DIAGNOSIS — I82442 Acute embolism and thrombosis of left tibial vein: Secondary | ICD-10-CM | POA: Diagnosis present

## 2019-06-15 DIAGNOSIS — E785 Hyperlipidemia, unspecified: Secondary | ICD-10-CM | POA: Diagnosis present

## 2019-06-15 DIAGNOSIS — M542 Cervicalgia: Secondary | ICD-10-CM | POA: Diagnosis present

## 2019-06-15 DIAGNOSIS — I82412 Acute embolism and thrombosis of left femoral vein: Secondary | ICD-10-CM | POA: Diagnosis not present

## 2019-06-15 LAB — CBC
HCT: 39.3 % (ref 39.0–52.0)
Hemoglobin: 13.2 g/dL (ref 13.0–17.0)
MCH: 31.7 pg (ref 26.0–34.0)
MCHC: 33.6 g/dL (ref 30.0–36.0)
MCV: 94.2 fL (ref 80.0–100.0)
Platelets: 170 10*3/uL (ref 150–400)
RBC: 4.17 MIL/uL — ABNORMAL LOW (ref 4.22–5.81)
RDW: 14.8 % (ref 11.5–15.5)
WBC: 9.8 10*3/uL (ref 4.0–10.5)
nRBC: 0 % (ref 0.0–0.2)

## 2019-06-15 LAB — BASIC METABOLIC PANEL
Anion gap: 10 (ref 5–15)
BUN: 24 mg/dL — ABNORMAL HIGH (ref 8–23)
CO2: 22 mmol/L (ref 22–32)
Calcium: 9.7 mg/dL (ref 8.9–10.3)
Chloride: 107 mmol/L (ref 98–111)
Creatinine, Ser: 1.1 mg/dL (ref 0.61–1.24)
GFR calc Af Amer: >60 mL/min (ref >60)
GFR calc non Af Amer: >60 mL/min (ref >60)
Glucose, Bld: 118 mg/dL — ABNORMAL HIGH (ref 70–99)
Potassium: 4.2 mmol/L (ref 3.5–5.1)
Sodium: 139 mmol/L (ref 135–145)

## 2019-06-15 LAB — TROPONIN I (HIGH SENSITIVITY)
Troponin I (High Sensitivity): 259 ng/L (ref <18)
Troponin I (High Sensitivity): 308 ng/L (ref <18)

## 2019-06-15 LAB — SARS CORONAVIRUS 2 BY RT PCR (HOSPITAL ORDER, PERFORMED IN ~~LOC~~ HOSPITAL LAB): SARS Coronavirus 2: NEGATIVE

## 2019-06-15 LAB — BRAIN NATRIURETIC PEPTIDE: B Natriuretic Peptide: 110.5 pg/mL — ABNORMAL HIGH (ref 0.0–100.0)

## 2019-06-15 MED ORDER — LISINOPRIL 10 MG PO TABS
20.0000 mg | ORAL_TABLET | Freq: Every day | ORAL | Status: DC
  Administered 2019-06-16: 10:00:00 20 mg via ORAL
  Filled 2019-06-15: qty 1

## 2019-06-15 MED ORDER — ATORVASTATIN CALCIUM 40 MG PO TABS
40.0000 mg | ORAL_TABLET | Freq: Every day | ORAL | Status: DC
  Administered 2019-06-16 – 2019-06-17 (×2): 40 mg via ORAL
  Filled 2019-06-15 (×2): qty 1

## 2019-06-15 MED ORDER — CYCLOBENZAPRINE HCL 10 MG PO TABS
10.0000 mg | ORAL_TABLET | Freq: Three times a day (TID) | ORAL | Status: DC | PRN
  Administered 2019-06-15 – 2019-06-16 (×2): 10 mg via ORAL
  Filled 2019-06-15 (×2): qty 1

## 2019-06-15 MED ORDER — HEPARIN (PORCINE) 25000 UT/250ML-% IV SOLN
1200.0000 [IU]/h | INTRAVENOUS | Status: DC
  Administered 2019-06-15: 16:00:00 1500 [IU]/h via INTRAVENOUS
  Administered 2019-06-17: 1200 [IU]/h via INTRAVENOUS
  Filled 2019-06-15 (×3): qty 250

## 2019-06-15 MED ORDER — GABAPENTIN 100 MG PO CAPS
100.0000 mg | ORAL_CAPSULE | Freq: Two times a day (BID) | ORAL | Status: DC
  Administered 2019-06-15 – 2019-06-18 (×6): 100 mg via ORAL
  Filled 2019-06-15 (×6): qty 1

## 2019-06-15 MED ORDER — HYDROCHLOROTHIAZIDE 25 MG PO TABS
25.0000 mg | ORAL_TABLET | Freq: Every day | ORAL | Status: DC
  Administered 2019-06-16: 25 mg via ORAL
  Filled 2019-06-15 (×2): qty 1

## 2019-06-15 MED ORDER — HEPARIN BOLUS VIA INFUSION
5500.0000 [IU] | Freq: Once | INTRAVENOUS | Status: AC
  Administered 2019-06-15: 16:00:00 5500 [IU] via INTRAVENOUS
  Filled 2019-06-15: qty 5500

## 2019-06-15 MED ORDER — PAROXETINE HCL 20 MG PO TABS
20.0000 mg | ORAL_TABLET | Freq: Every day | ORAL | Status: DC
  Administered 2019-06-16 – 2019-06-18 (×3): 20 mg via ORAL
  Filled 2019-06-15 (×4): qty 1

## 2019-06-15 MED ORDER — IOHEXOL 350 MG/ML SOLN
100.0000 mL | Freq: Once | INTRAVENOUS | Status: AC | PRN
  Administered 2019-06-15: 68 mL via INTRAVENOUS

## 2019-06-15 MED ORDER — LISINOPRIL-HYDROCHLOROTHIAZIDE 20-25 MG PO TABS: 1.0000 | ORAL_TABLET | Freq: Every day | ORAL | Status: DC

## 2019-06-15 MED ORDER — SODIUM CHLORIDE 0.45 % IV SOLN
INTRAVENOUS | Status: DC
  Administered 2019-06-16: 11:00:00 via INTRAVENOUS

## 2019-06-15 MED ORDER — SENNA 8.6 MG PO TABS
1.0000 | ORAL_TABLET | Freq: Two times a day (BID) | ORAL | Status: DC
  Administered 2019-06-15 – 2019-06-18 (×6): 8.6 mg via ORAL
  Filled 2019-06-15 (×7): qty 1

## 2019-06-15 MED ORDER — AMLODIPINE BESYLATE 5 MG PO TABS
5.0000 mg | ORAL_TABLET | Freq: Every day | ORAL | Status: DC
  Administered 2019-06-17 – 2019-06-18 (×2): 5 mg via ORAL
  Filled 2019-06-15 (×3): qty 1

## 2019-06-15 MED ORDER — ALLOPURINOL 300 MG PO TABS
300.0000 mg | ORAL_TABLET | Freq: Every day | ORAL | Status: DC
  Administered 2019-06-16 – 2019-06-18 (×3): 300 mg via ORAL
  Filled 2019-06-15 (×4): qty 1

## 2019-06-15 MED ORDER — NAPROXEN 250 MG PO TABS
500.0000 mg | ORAL_TABLET | Freq: Two times a day (BID) | ORAL | Status: DC
  Administered 2019-06-16 – 2019-06-18 (×5): 500 mg via ORAL
  Filled 2019-06-15 (×5): qty 2

## 2019-06-15 MED ORDER — SODIUM CHLORIDE 0.9% FLUSH: 3.0000 mL | Freq: Once | INTRAVENOUS | Status: DC

## 2019-06-15 MED ORDER — OXYCODONE-ACETAMINOPHEN 5-325 MG PO TABS
2.0000 | ORAL_TABLET | Freq: Four times a day (QID) | ORAL | Status: DC | PRN
  Administered 2019-06-15 – 2019-06-17 (×5): 2 via ORAL
  Filled 2019-06-15 (×5): qty 2

## 2019-06-15 NOTE — ED Notes (Signed)
Help get patient undress on the monitor patient is resting with call bell in reach 

## 2019-06-15 NOTE — ED Notes (Signed)
Family at the bedside.

## 2019-06-15 NOTE — Progress Notes (Signed)
ANTICOAGULATION CONSULT NOTE - Initial Consult  Pharmacy Consult for heparin Indication: ACS vs PE  No Known Allergies  Patient Measurements: Height: 6' (182.9 cm) Weight: 225 lb (102.1 kg) IBW/kg (Calculated) : 77.6 Heparin Dosing Weight: 98.5kg  Vital Signs: Temp: 98.4 F (36.9 C) (08/11 1208) Temp Source: Oral (08/11 1208) BP: 120/87 (08/11 1430) Pulse Rate: 92 (08/11 1430)  Labs: Recent Labs    06/15/19 1215 06/15/19 1412  HGB 13.2  --   HCT 39.3  --   PLT 170  --   CREATININE 1.10  --   TROPONINIHS 259* 308*    Estimated Creatinine Clearance: 71.7 mL/min (by C-G formula based on SCr of 1.1 mg/dL).   Medical History: Past Medical History:  Diagnosis Date  . Arthritis    neck and back   . Chronic neck pain   . Depression   . ED (erectile dysfunction)   . GERD (gastroesophageal reflux disease)    dysphagia  . Gout    sees Dr. Leigh Aurora   . Hyperlipidemia   . Hypertension   . OSA (obstructive sleep apnea) 12/18/2017   Severe with AHI at 70.1/hr and is now on CPAP at [place]  . Prostate cancer [facility])    history of  . Sleep apnea    snoring/never checked    Medications:  Infusions:  . sodium chloride    . sodium chloride    . heparin      Assessment: Ricky Meyers presented to the ED with CP and SOB. Troponin mildly elevated. To start IV heparin for possible ACS vs PE. CT of the chest is ordered. Baseline CBC is WNL. He is not on anticoagulation PTA.   Goal of Therapy:  Heparin level 0.3-0.7 units/ml Monitor platelets by anticoagulation protocol: Yes   Plan:  Heparin bolus 5500 units IV x 1 Heparin gtt 1500 units/hr Check an 8 hr heparin level Daily heparin level and CBC  Jameek Bruntz, Rande Lawman 06/15/2019,3:23 PM

## 2019-06-15 NOTE — Telephone Encounter (Signed)
Called pt earlier no answer. Called back and the pt daughter answered the phone and stated she took pt to hospital. Pt was dx with pulmonary embolism.

## 2019-06-15 NOTE — ED Provider Notes (Signed)
Sudden SOB esp with exertion. Trop elevated. Started on heparin, PE study pending. EKG abnormal but no acute changes. Will need cards consult if PE study neg. Physical Exam  BP 127/90   Pulse 91   Temp 98.4 F (36.9 C) (Oral)   Resp 17   Ht 6' (1.829 m)   Wt 102.1 kg   SpO2 94%   BMI 30.52 kg/m   Physical Exam Patient is alert and appropriate.  Mental status is clear.  No respiratory distress at rest.  Heart rate 95.  Oxygen saturation 99% on 2 L oxygen.  Oxygen saturation 92% on room air.  Blood pressure normal. ED Course/Procedures     Procedures  MDM  Consult: Have reviewed with the intensivist.  Have reviewed the patient's risk factors and PE risk score.  At this time advises patient is appropriate for admission to [facility] with consultation to intensivist if needed.  I have extensively reviewed the nature of pulmonary embolus and the treatment with the patient and his companion.  I answered questions and reviewed results.       Charlesetta Shanks, MD 06/15/19 [phone]

## 2019-06-15 NOTE — ED Notes (Signed)
Patient transported to CT 

## 2019-06-15 NOTE — ED Triage Notes (Signed)
Onset one week ago developed chest pain briefly and resolved. Today developed shortness of breath while showering and chest pain 3/10 dull intermittent.

## 2019-06-15 NOTE — ED Notes (Signed)
Pt placed on 2L [place]

## 2019-06-15 NOTE — H&P (Signed)
History and Physical    Ricky Meyers EXB:[id number] DOB: 1946-03-21 DOA: 06/15/2019  PCP: [name], MD (Confirm with patient/family/NH records and if not entered, this has to be entered at [facility] point of entry) Patient coming from: home  I have personally briefly reviewed patient's old medical records in [place]  Chief Complaint: SOB  HPI: Ricky Meyers is a 73 y.o. male with medical history significant of HTN, HLD, obesity who reports a several month h/o declining energy with decreased exercise tolerance - mild SOB being the limiting factor. On the morning of admission while showering he had sudden onset of SOB that required him to sit down. He walked his driveway to get the paper and had marked DOE and had to rest. This was new. Due to new onset SOB he presented to the ED.  ED Course: Patient was hemodynamically stable. CR was unremarkable. CT angio chest revealed a submassive PE. CCC-Dr. Chase Caller reviewed the data and did not feel intervention was required. Patient referred for admission for IV heparin.  Review of Systems: As per HPI otherwise 10 point review of systems negative.    Past Medical History:  Diagnosis Date  . Arthritis    neck and back   . Chronic neck pain   . Depression   . ED (erectile dysfunction)   . GERD (gastroesophageal reflux disease)    dysphagia  . Gout    sees Dr. Leigh Aurora   . Hyperlipidemia   . Hypertension   . OSA (obstructive sleep apnea) 12/18/2017   Severe with AHI at 70.1/hr and is now on CPAP at [place]  . Prostate cancer [facility])    history of  . Sleep apnea    snoring/never checked    Past Surgical History:  Procedure Laterality Date  . AMPUTATION Left 07/18/2018   Procedure: LEFT RING FINGER REVISION AMPUTATION;  Surgeon: Leanora Cover, MD;  Location: [place];  Service: Orthopedics;  Laterality: Left;  . CERVICAL FUSION  12/02/07   per Dr. Sherley Bounds  . COLONOSCOPY  03/11/2017   per Dr. Ardis Hughs, clear, no repeats  needed   . injection lower back Right 09/17/2016  . PROSTATECTOMY    . TONSILLECTOMY    . VASECTOMY       reports that he has never smoked. He has never used smokeless tobacco. He reports current alcohol use. He reports that he does not use drugs.  No Known Allergies  Family History  Problem Relation Age of Onset  . Cancer Other        breast, porstate  . Hypertension Other   . Stroke Other   . Breast cancer Mother   . Diverticulitis Mother   . Heart disease Father   . Prostate cancer Father   . Stroke Father   . Dementia Sister   . Colon cancer Neg Hx    Social Hx - HSG, Married for 27 years then divorced. Had two daughters. Remarried 20 years ago. 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 Lives with his wife and their dog. Daughters and grandchildren are nearby.  Prior to Admission medications   Medication Sig Start Date End Date Taking? Authorizing Provider  allopurinol (ZYLOPRIM) 300 MG tablet TAKE 1 TABLET BY MOUTH EVERY DAY Patient taking differently: Take 300 mg by mouth daily.  05/27/19  Yes [name], MD  amLODipine (NORVASC) 5 MG tablet TAKE 1 TABLET BY MOUTH EVERY DAY Patient taking differently: Take 5 mg by mouth daily.  01/21/19  Yes [name], MD  atorvastatin (LIPITOR) 40 MG tablet TAKE 1 TABLET BY MOUTH EVERY DAY Patient taking differently: Take 40 mg by mouth daily at 6 PM.  01/21/19  Yes [name], MD  cyclobenzaprine (FLEXERIL) 10 MG tablet TAKE 1 TABLET BY MOUTH 3 TIMES DAILY AS NEEDED FOR MUSCLE SPASMS Patient taking differently: Take 10 mg by mouth 3 (three) times daily as needed for muscle spasms.  04/22/19  Yes [name], MD  gabapentin (NEURONTIN) 100 MG capsule TAKE 1 CAPSULE BY MOUTH 2 TIMES DAILY Patient taking differently: Take 100 mg by mouth 2 (two) times daily.  06/01/19  Yes [name], MD  lisinopril-hydrochlorothiazide (ZESTORETIC) 20-25 MG tablet TAKE 1 TABLET BY MOUTH EVERY DAY Patient taking differently: Take  1 tablet by mouth daily.  05/04/19  Yes [name], MD  naproxen (NAPROSYN) 500 MG tablet TAKE 1 TABLET BY MOUTH TWICE A DAY WITH A MEAL Patient taking differently: Take 500 mg by mouth 2 (two) times daily with a meal.  06/01/19  Yes [name], MD  oxyCODONE-acetaminophen (PERCOCET) 10-325 MG tablet Take 1 tablet by mouth every 6 (six) hours as needed for up to 30 days for pain. 06/19/19 07/19/19 Yes [name], MD  PARoxetine (PAXIL) 20 MG tablet TAKE 1 TABLET BY MOUTH EVERY MORNING Patient taking differently: Take 20 mg by mouth daily.  06/01/19  Yes [name], MD  psyllium (METAMUCIL) 58.6 % packet Take 1 packet by mouth daily as needed (for constipation).    Yes [provider]    Physical Exam: Vitals:   06/15/19 1845 06/15/19 1900 06/15/19 1930 06/15/19 2000  BP:  128/79 113/87 115/73  Pulse: 89 91 85 85  Resp: 19 19 19 18   Temp:      TempSrc:      SpO2: 95% 96% 96% 97%  Weight:      Height:        Constitutional: NAD, calm, comfortable Vitals:   06/15/19 1845 06/15/19 1900 06/15/19 1930 06/15/19 2000  BP:  128/79 113/87 115/73  Pulse: 89 91 85 85  Resp: 19 19 19 18   Temp:      TempSrc:      SpO2: 95% 96% 96% 97%  Weight:      Height:       General:  Overweight man looking younger than his stated age Eyes: PERRL, lids and conjunctivae normal, EOMI ENMT: Mucous membranes are moist. Posterior pharynx clear of any exudate or lesions.Normal dentition.  Neck: normal, supple, no masses, no thyromegaly Respiratory: clear to auscultation bilaterally, no wheezing, no crackles. Normal respiratory effort. No accessory muscle use.  Cardiovascular: Regular rate and rhythm, no murmurs / rubs / gallops. No extremity edema. 2+ pedal pulses. No carotid bruits.  Abdomen: obese, no tenderness, no masses palpated. No hepatosplenomegaly. Bowel sounds positive.  Musculoskeletal: no clubbing / cyanosis. No joint deformity upper and lower extremities. Good ROM, no  contractures. Normal muscle tone. No calve tenderness or mass. Skin: no rashes, lesions, ulcers. No induration Neurologic: CN 2-12 grossly intact. Sensation intact. Strength 5/5 in all 4.  Psychiatric: Normal judgment and insight. Alert and oriented x 3. Normal mood.     Labs on Admission: I have personally reviewed following labs and imaging studies  CBC: Recent Labs  Lab 06/15/19 1215  WBC 9.8  HGB 13.2  HCT 39.3  MCV 94.2  PLT 412   Basic Metabolic Panel: Recent Labs  Lab  06/15/19 1215  NA 139  K 4.2  CL 107  CO2 22  GLUCOSE 118*  BUN 24*  CREATININE 1.10  CALCIUM 9.7   GFR: Estimated Creatinine Clearance: 71.7 mL/min (by C-G formula based on SCr of 1.1 mg/dL). Liver Function Tests: No results for input(s): AST, ALT, ALKPHOS, BILITOT, PROT, ALBUMIN in the last 168 hours. No results for input(s): LIPASE, AMYLASE in the last 168 hours. No results for input(s): AMMONIA in the last 168 hours. Coagulation Profile: No results for input(s): INR, PROTIME in the last 168 hours. Cardiac Enzymes: No results for input(s): CKTOTAL, CKMB, CKMBINDEX, TROPONINI in the last 168 hours. BNP (last 3 results) No results for input(s): PROBNP in the last 8760 hours. HbA1C: No results for input(s): HGBA1C in the last 72 hours. CBG: No results for input(s): GLUCAP in the last 168 hours. Lipid Profile: No results for input(s): CHOL, HDL, LDLCALC, TRIG, CHOLHDL, LDLDIRECT in the last 72 hours. Thyroid Function Tests: No results for input(s): TSH, T4TOTAL, FREET4, T3FREE, THYROIDAB in the last 72 hours. Anemia Panel: No results for input(s): VITAMINB12, FOLATE, FERRITIN, TIBC, IRON, RETICCTPCT in the last 72 hours. Urine analysis:    Component Value Date/Time   COLORURINE AMBER (A) 12/03/2015 1134   APPEARANCEUR CLEAR 12/03/2015 1134   LABSPEC 1.023 12/03/2015 1134   PHURINE 7.0 12/03/2015 1134   GLUCOSEU NEGATIVE 12/03/2015 1134   GLUCOSEU NEGATIVE 09/26/2009 0752   HGBUR  NEGATIVE 12/03/2015 1134   BILIRUBINUR Negative 07/15/2018 1001   KETONESUR NEGATIVE 12/03/2015 1134   PROTEINUR Negative 07/15/2018 1001   PROTEINUR 30 (A) 12/03/2015 1134   UROBILINOGEN 0.2 07/15/2018 1001   UROBILINOGEN 1.0 12/02/2014 0138   NITRITE Negative 07/15/2018 1001   NITRITE NEGATIVE 12/03/2015 1134   LEUKOCYTESUR Negative 07/15/2018 1001    Radiological Exams on Admission: Dg Chest 2 View  Result Date: 06/15/2019 CLINICAL DATA:  Shortness of breath and chest pain EXAM: CHEST - 2 VIEW COMPARISON:  June 10, 2017 FINDINGS: There is no edema or consolidation. Heart size and pulmonary vascularity are normal. No adenopathy. There is postoperative change in the lower cervical region. There is anterior wedging of lower thoracic vertebral bodies. No pneumothorax. IMPRESSION: No edema or consolidation.  Stable cardiac silhouette. Electronically Signed   By: Lowella Grip III M.D.   On: 06/15/2019 13:06   Ct Angio Chest Pe W And/or Wo Contrast  Result Date: 06/15/2019 CLINICAL DATA:  Chest pain and shortness of breath. EXAM: CT ANGIOGRAPHY CHEST WITH CONTRAST TECHNIQUE: Multidetector CT imaging of the chest was performed using the standard protocol during bolus administration of intravenous contrast. Multiplanar CT image reconstructions and MIPs were obtained to evaluate the vascular anatomy. CONTRAST:  70mL OMNIPAQUE IOHEXOL 350 MG/ML SOLN COMPARISON:  CTA chest dated June 11, 2017. FINDINGS: Cardiovascular: Acute bilateral lobar and segmental pulmonary emboli involving all lobes of both lungs. Evidence of right heart strain with RV/LV ratio of 1.9. Reflux of contrast into the intrahepatic IVC. Normal heart size. No pericardial effusion. Coronary, aortic arch, and branch vessel atherosclerotic vascular disease. No thoracic aortic aneurysm. Mediastinum/Nodes: No enlarged mediastinal, hilar, or axillary lymph nodes. Thyroid gland, trachea, and esophagus demonstrate no significant findings.  Lungs/Pleura: Minimal dependent subsegmental atelectasis. No focal consolidation, pleural effusion, or pneumothorax. No suspicious pulmonary nodule. Upper Abdomen: No acute abnormality. Unchanged small simple cyst in the right hepatic lobe. Musculoskeletal: No chest wall abnormality. No acute or significant osseous findings. Chronic mild lower thoracic compression deformities. Review of the MIP images confirms the above  findings. IMPRESSION: 1. Positive for acute PE with CT evidence of right heart strain (RV/LV Ratio = 1.9) consistent with at least submassive (intermediate risk) PE. The presence of right heart strain has been associated with an increased risk of morbidity and mortality. Please activate Code PE by paging [phone]. 2.  Aortic atherosclerosis (ICD10-I70.0). Critical Value/emergent results were called by telephone at the time of interpretation on 06/15/2019 at 4:20 pm to Dr. Lennice Sites , who verbally acknowledged these results. Electronically Signed   By: Titus Dubin M.D.   On: 06/15/2019 16:21    EKG: Independently reviewed. Sinus tach, old anteroseptal infarct, intraventricular conduction block  Assessment/Plan Active Problems:   Pulmonary embolus (HCC)   Hyperlipemia   Essential hypertension   Pulmonary embolism ([place])    1. PE- submassive PE on CT angio. No identified risks: no travel, is active, no known malignancy. Plan  Tele admit  IV heparin, start Xarelto in AM for transition.  LE - doppler  2-D echo  2. Cardiac - patient with decreased exercise tolerance. Had 2D echo 06/26/17 - LVEF 45-50%, antero-septal hypokinesis, grade I diastolic dysfxn. Have stress nuc study 06/26/17 - fixed defects, no active ischemia Plan -  Continue risk reduction: BP control, Lipid mgt.  3. HTN- continue home meds  4. Gout - joints appear normal, no tophi Plan Continue home meds.  DVT prophylaxis: heparin (Lovenox/Heparin/SCD's/anticoagulated/None (if comfort care) Code Status:  limited code: no CPR, ok for intubation-short term (Full/Partial (specify details) Family Communication: Patient and wife understand plan. (Specify name, relationship. Do not write "discussed with patient". Specify tel # if discussed over the phone) Disposition Plan: home 3 days  (specify when and where you expect patient to be discharged) Consults called: [place] (with names) Admission status: inpatient (inpatient / obs / tele / medical floor / SDU)   Adella Hare MD [facility] Pager [phone]  If 7PM-7AM, please contact night-coverage [email or web address] Password [facility]  06/15/2019, 8:04 PM

## 2019-06-15 NOTE — Progress Notes (Signed)
Call from ER doc  Submassive PE - RV/LV ratio 1.9   Vitals stable per ER doc - 2L - 99%.  (91% rA)  Hx of CHF - ef 45% - 2018 echo  Proastate cancer does not appear active Results for WALT, GEATHERS (MRN [id number]) as of 06/15/2019 16:47  Ref. Range 10/07/2013 08:57 03/07/2016 10:51 07/15/2018 09:41  PSA Latest Ref Range: 0.10 - 4.00 ng/mL 0.01 (L) 0.01 (L) 0.01 (L)   No results for input(s): LATICACIDVEN, PROCALCITON in the last 168 hours.   A Submassive PE - Class 3 PESI score 0 95 points based on age. Male, chf x  P - IV heparin gtt  - - if decompensates the, lytic (either EKOS or systemic) as rescur Rx - call CCM them  - recommend admit to progressive care under triad -      SIGNATURE    Dr. Brand Males, M.D., F.C.C.P,  Pulmonary and Critical Care Medicine Staff Physician, [place] Director - Interstitial Lung Disease  Program  P[facility] [place] at [place], [place], [address]  Pager: [phone], If no answer or between  15:00h - 7:00h: call [phone] Telephone: [phone]  4:52 PM 06/15/2019    Dg Chest 2 View  Result Date: 06/15/2019 CLINICAL DATA:  Shortness of breath and chest pain EXAM: CHEST - 2 VIEW COMPARISON:  June 10, 2017 FINDINGS: There is no edema or consolidation. Heart size and pulmonary vascularity are normal. No adenopathy. There is postoperative change in the lower cervical region. There is anterior wedging of lower thoracic vertebral bodies. No pneumothorax. IMPRESSION: No edema or consolidation.  Stable cardiac silhouette. Electronically Signed   By: Lowella Grip III M.D.   On: 06/15/2019 13:06   Ct Angio Chest Pe W And/or Wo Contrast  Result Date: 06/15/2019 CLINICAL DATA:  Chest pain and shortness of breath. EXAM: CT ANGIOGRAPHY CHEST WITH CONTRAST TECHNIQUE: Multidetector CT imaging of the chest was performed using the standard protocol during bolus  administration of intravenous contrast. Multiplanar CT image reconstructions and MIPs were obtained to evaluate the vascular anatomy. CONTRAST:  67mL OMNIPAQUE IOHEXOL 350 MG/ML SOLN COMPARISON:  CTA chest dated June 11, 2017. FINDINGS: Cardiovascular: Acute bilateral lobar and segmental pulmonary emboli involving all lobes of both lungs. Evidence of right heart strain with RV/LV ratio of 1.9. Reflux of contrast into the intrahepatic IVC. Normal heart size. No pericardial effusion. Coronary, aortic arch, and branch vessel atherosclerotic vascular disease. No thoracic aortic aneurysm. Mediastinum/Nodes: No enlarged mediastinal, hilar, or axillary lymph nodes. Thyroid gland, trachea, and esophagus demonstrate no significant findings. Lungs/Pleura: Minimal dependent subsegmental atelectasis. No focal consolidation, pleural effusion, or pneumothorax. No suspicious pulmonary nodule. Upper Abdomen: No acute abnormality. Unchanged small simple cyst in the right hepatic lobe. Musculoskeletal: No chest wall abnormality. No acute or significant osseous findings. Chronic mild lower thoracic compression deformities. Review of the MIP images confirms the above findings. IMPRESSION: 1. Positive for acute PE with CT evidence of right heart strain (RV/LV Ratio = 1.9) consistent with at least submassive (intermediate risk) PE. The presence of right heart strain has been associated with an increased risk of morbidity and mortality. Please activate Code PE by paging [phone]. 2.  Aortic atherosclerosis (ICD10-I70.0). Critical Value/emergent results were called by telephone at the time of interpretation on 06/15/2019 at 4:20 pm to Dr. Lennice Sites , who verbally acknowledged these results. Electronically Signed   By: Titus Dubin  M.D.   On: 06/15/2019 16:21

## 2019-06-15 NOTE — ED Provider Notes (Signed)
[facility] EMERGENCY DEPARTMENT Provider Note   CSN: [id number] Arrival date & time: 06/15/19  1156    History   Chief Complaint Chief Complaint  Patient presents with   Chest Pain   Shortness of Breath    HPI Ricky Meyers is a 73 y.o. male.     The history is provided by the patient.  Shortness of Breath Severity:  Moderate Onset quality:  Sudden Timing:  Constant Progression:  Worsening Chronicity:  New Context: activity   Relieved by:  Nothing Worsened by:  Exertion Associated symptoms: chest pain   Associated symptoms: no abdominal pain, no cough, no ear pain, no fever, no rash, no sore throat, no sputum production, no vomiting and no wheezing   Risk factors: hx of cancer   Risk factors: no recent alcohol use and no hx of PE/DVT     Past Medical History:  Diagnosis Date   Arthritis    neck and back    Chronic neck pain    Depression    ED (erectile dysfunction)    GERD (gastroesophageal reflux disease)    dysphagia   Gout    sees Dr. Leigh Aurora    Hyperlipidemia    Hypertension    OSA (obstructive sleep apnea) 12/18/2017   Severe with AHI at 70.1/hr and is now on CPAP at 11cm H2o   Prostate cancer [facility])    history of   Sleep apnea    snoring/never checked    Patient Active Problem List   Diagnosis Date Noted   Pulmonary embolus ([place]) 06/15/2019   Pulmonary embolism ([place]) 06/15/2019   Osteoarthritis 10/14/2018   OSA (obstructive sleep apnea) 12/18/2017   Obesity (BMI 30.0-34.9) 06/17/2017   Snoring 06/17/2017   LBBB (left bundle branch block) 10/22/2016   DOE (dyspnea on exertion) 10/22/2016   Ectopic beat, ventricular 10/22/2016   Anemia, iron deficiency 12/03/2014   Pseudogout of elbow 12/02/2014   Arthritis 12/02/2014   Hyponatremia 12/02/2014   Constipation 12/02/2014   Suspected Septic arthritis 12/02/2014   Depression 03/04/2008   Hyperlipemia 06/30/2007   Gout 06/30/2007    Essential hypertension 06/30/2007   NECK PAIN 06/30/2007   PROSTATE CANCER, HX OF 06/30/2007    Past Surgical History:  Procedure Laterality Date   AMPUTATION Left 07/18/2018   Procedure: LEFT RING FINGER REVISION AMPUTATION;  Surgeon: Leanora Cover, MD;  Location: [facility];  Service: Orthopedics;  Laterality: Left;   CERVICAL FUSION  12/02/07   per Dr. Sherley Bounds   COLONOSCOPY  03/11/2017   per Dr. Ardis Hughs, clear, no repeats needed    injection lower back Right 09/17/2016   PROSTATECTOMY     TONSILLECTOMY     VASECTOMY          Home Medications    Prior to Admission medications   Medication Sig Start Date End Date Taking? Authorizing Provider  allopurinol (ZYLOPRIM) 300 MG tablet TAKE 1 TABLET BY MOUTH EVERY DAY Patient taking differently: Take 300 mg by mouth daily.  05/27/19  Yes [name], MD  amLODipine (NORVASC) 5 MG tablet TAKE 1 TABLET BY MOUTH EVERY DAY Patient taking differently: Take 5 mg by mouth daily.  01/21/19  Yes [name], MD  atorvastatin (LIPITOR) 40 MG tablet TAKE 1 TABLET BY MOUTH EVERY DAY Patient taking differently: Take 40 mg by mouth daily at 6 PM.  01/21/19  Yes [name], MD  cyclobenzaprine (FLEXERIL) 10 MG tablet TAKE 1 TABLET BY MOUTH 3 TIMES  DAILY AS NEEDED FOR MUSCLE SPASMS Patient taking differently: Take 10 mg by mouth 3 (three) times daily as needed for muscle spasms.  04/22/19  Yes [name], MD  gabapentin (NEURONTIN) 100 MG capsule TAKE 1 CAPSULE BY MOUTH 2 TIMES DAILY Patient taking differently: Take 100 mg by mouth 2 (two) times daily.  06/01/19  Yes [name], MD  lisinopril-hydrochlorothiazide (ZESTORETIC) 20-25 MG tablet TAKE 1 TABLET BY MOUTH EVERY DAY Patient taking differently: Take 1 tablet by mouth daily.  05/04/19  Yes [name], MD  naproxen (NAPROSYN) 500 MG tablet TAKE 1 TABLET BY MOUTH TWICE A DAY WITH A MEAL Patient taking differently: Take 500 mg by mouth 2 (two) times daily with a meal.  06/01/19   Yes [name], MD  oxyCODONE-acetaminophen (PERCOCET) 10-325 MG tablet Take 1 tablet by mouth every 6 (six) hours as needed for up to 30 days for pain. 06/19/19 07/19/19 Yes [name], MD  PARoxetine (PAXIL) 20 MG tablet TAKE 1 TABLET BY MOUTH EVERY MORNING Patient taking differently: Take 20 mg by mouth daily.  06/01/19  Yes [name], MD  psyllium (METAMUCIL) 58.6 % packet Take 1 packet by mouth daily as needed (for constipation).    Yes [provider]    Family History Family History  Problem Relation Age of Onset   Cancer Other        breast, porstate   Hypertension Other    Stroke Other    Breast cancer Mother    Diverticulitis Mother    Heart disease Father    Prostate cancer Father    Stroke Father    Dementia Sister    Colon cancer Neg Hx     Social History Social History   Tobacco Use   Smoking status: Never Smoker   Smokeless tobacco: Never Used  Substance Use Topics   Alcohol use: Yes    Alcohol/week: 0.0 standard drinks    Comment: occ   Drug use: No     Allergies   Patient has no known allergies.   Review of Systems Review of Systems  Constitutional: Negative for chills and fever.  HENT: Negative for ear pain and sore throat.   Eyes: Negative for pain and visual disturbance.  Respiratory: Positive for shortness of breath. Negative for cough, sputum production and wheezing.   Cardiovascular: Positive for chest pain. Negative for palpitations and leg swelling.  Gastrointestinal: Negative for abdominal pain and vomiting.  Genitourinary: Negative for dysuria and hematuria.  Musculoskeletal: Negative for arthralgias and back pain.  Skin: Negative for color change and rash.  Neurological: Negative for seizures and syncope.  All other systems reviewed and are negative.    Physical Exam Updated Vital Signs  ED Triage Vitals  Enc Vitals Group     BP 06/15/19 1208 118/75     Pulse Rate 06/15/19 1208 92     Resp  06/15/19 1208 18     Temp 06/15/19 1208 98.4 F (36.9 C)     Temp Source 06/15/19 1208 Oral     SpO2 06/15/19 1208 95 %     Weight 06/15/19 1210 225 lb (102.1 kg)     Height 06/15/19 1210 6' (1.829 m)     Head Circumference --      Peak Flow --      Pain Score 06/15/19 1210 3     Pain Loc --      Pain Edu? --  Excl. in [place]? --     Physical Exam Vitals signs and nursing note reviewed.  Constitutional:      General: He is not in acute distress.    Appearance: He is well-developed. He is not ill-appearing.  HENT:     Head: Normocephalic and atraumatic.  Eyes:     Extraocular Movements: Extraocular movements intact.     Conjunctiva/sclera: Conjunctivae normal.     Pupils: Pupils are equal, round, and reactive to light.  Neck:     Musculoskeletal: Neck supple.  Cardiovascular:     Rate and Rhythm: Regular rhythm. Tachycardia present.     Pulses:          Radial pulses are 2+ on the right side and 2+ on the left side.       Dorsalis pedis pulses are 2+ on the right side.     Heart sounds: Normal heart sounds. No murmur.  Pulmonary:     Effort: Tachypnea present. No respiratory distress.     Breath sounds: Normal breath sounds. No decreased breath sounds, wheezing or rhonchi.  Abdominal:     Palpations: Abdomen is soft.     Tenderness: There is no abdominal tenderness.  Musculoskeletal: Normal range of motion.     Right lower leg: No edema.     Left lower leg: No edema.  Skin:    General: Skin is warm and dry.     Capillary Refill: Capillary refill takes less than 2 seconds.  Neurological:     General: No focal deficit present.     Mental Status: He is alert.      ED Treatments / Results  Labs (all labs ordered are listed, but only abnormal results are displayed) Labs Reviewed  BASIC METABOLIC PANEL - Abnormal; Notable for the following components:      Result Value   Glucose, Bld 118 (*)    BUN 24 (*)    All other components within normal limits  CBC -  Abnormal; Notable for the following components:   RBC 4.17 (*)    All other components within normal limits  BRAIN NATRIURETIC PEPTIDE - Abnormal; Notable for the following components:   B Natriuretic Peptide 110.5 (*)    All other components within normal limits  TROPONIN I (HIGH SENSITIVITY) - Abnormal; Notable for the following components:   Troponin I (High Sensitivity) 259 (*)    All other components within normal limits  TROPONIN I (HIGH SENSITIVITY) - Abnormal; Notable for the following components:   Troponin I (High Sensitivity) 308 (*)    All other components within normal limits  SARS CORONAVIRUS 2 (HOSPITAL ORDER, [facility])  HEPARIN LEVEL (UNFRACTIONATED)  CBC    EKG EKG Interpretation  Date/Time:  Tuesday June 15 2019 12:03:10 EDT Ventricular Rate:  108 PR Interval:  178 QRS Duration: 128 QT Interval:  356 QTC Calculation: 477 R Axis:   104 Text Interpretation:  Sinus tachycardia Rightward axis Non-specific intra-ventricular conduction block Cannot rule out Anteroseptal infarct , age undetermined T wave abnormality, consider inferolateral ischemia Abnormal ECG Confirmed by Lennice Sites [phone]) on 06/15/2019 1:13:42 PM Also confirmed by Lennice Sites [phone])  on 06/15/2019 3:19:09 PM   Radiology Dg Chest 2 View  Result Date: 06/15/2019 CLINICAL DATA:  Shortness of breath and chest pain EXAM: CHEST - 2 VIEW COMPARISON:  June 10, 2017 FINDINGS: There is no edema or consolidation. Heart size and pulmonary vascularity are normal. No adenopathy. There is postoperative change in  the lower cervical region. There is anterior wedging of lower thoracic vertebral bodies. No pneumothorax. IMPRESSION: No edema or consolidation.  Stable cardiac silhouette. Electronically Signed   By: Lowella Grip III M.D.   On: 06/15/2019 13:06   Ct Angio Chest Pe W And/or Wo Contrast  Result Date: 06/15/2019 CLINICAL DATA:  Chest pain and shortness of breath.  EXAM: CT ANGIOGRAPHY CHEST WITH CONTRAST TECHNIQUE: Multidetector CT imaging of the chest was performed using the standard protocol during bolus administration of intravenous contrast. Multiplanar CT image reconstructions and MIPs were obtained to evaluate the vascular anatomy. CONTRAST:  45mL OMNIPAQUE IOHEXOL 350 MG/ML SOLN COMPARISON:  CTA chest dated June 11, 2017. FINDINGS: Cardiovascular: Acute bilateral lobar and segmental pulmonary emboli involving all lobes of both lungs. Evidence of right heart strain with RV/LV ratio of 1.9. Reflux of contrast into the intrahepatic IVC. Normal heart size. No pericardial effusion. Coronary, aortic arch, and branch vessel atherosclerotic vascular disease. No thoracic aortic aneurysm. Mediastinum/Nodes: No enlarged mediastinal, hilar, or axillary lymph nodes. Thyroid gland, trachea, and esophagus demonstrate no significant findings. Lungs/Pleura: Minimal dependent subsegmental atelectasis. No focal consolidation, pleural effusion, or pneumothorax. No suspicious pulmonary nodule. Upper Abdomen: No acute abnormality. Unchanged small simple cyst in the right hepatic lobe. Musculoskeletal: No chest wall abnormality. No acute or significant osseous findings. Chronic mild lower thoracic compression deformities. Review of the MIP images confirms the above findings. IMPRESSION: 1. Positive for acute PE with CT evidence of right heart strain (RV/LV Ratio = 1.9) consistent with at least submassive (intermediate risk) PE. The presence of right heart strain has been associated with an increased risk of morbidity and mortality. Please activate Code PE by paging [phone]. 2.  Aortic atherosclerosis (ICD10-I70.0). Critical Value/emergent results were called by telephone at the time of interpretation on 06/15/2019 at 4:20 pm to Dr. Lennice Sites , who verbally acknowledged these results. Electronically Signed   By: Titus Dubin M.D.   On: 06/15/2019 16:21    Procedures .Critical  Care Performed by: Lennice Sites, DO Authorized by: Lennice Sites, DO   Critical care provider statement:    Critical care time (minutes):  45   Critical care was necessary to treat or prevent imminent or life-threatening deterioration of the following conditions:  Respiratory failure   Critical care was time spent personally by me on the following activities:  Blood draw for specimens, development of treatment plan with patient or surrogate, discussions with primary provider, discussions with consultants, evaluation of patient's response to treatment, examination of patient, obtaining history from patient or surrogate, ordering and performing treatments and interventions, ordering and review of laboratory studies, ordering and review of radiographic studies, pulse oximetry, re-evaluation of patient's condition and review of old charts   I assumed direction of critical care for this patient from another provider in my specialty: no     (including critical care time)  Medications Ordered in ED Medications  sodium chloride flush (NS) 0.9 % injection 3 mL (has no administration in time range)  heparin ADULT infusion 100 units/mL (25000 units/280mL sodium chloride 0.45%) (1,500 Units/hr Intravenous New Bag/Given 06/15/19 1624)  allopurinol (ZYLOPRIM) tablet 300 mg (has no administration in time range)  naproxen (NAPROSYN) tablet 500 mg (has no administration in time range)  oxyCODONE-acetaminophen (PERCOCET/ROXICET) 5-325 MG per tablet 2 tablet (has no administration in time range)  amLODipine (NORVASC) tablet 5 mg (has no administration in time range)  atorvastatin (LIPITOR) tablet 40 mg (has no administration in time range)  PARoxetine (PAXIL)  tablet 20 mg (has no administration in time range)  cyclobenzaprine (FLEXERIL) tablet 10 mg (has no administration in time range)  gabapentin (NEURONTIN) capsule 100 mg (has no administration in time range)  0.45 % sodium chloride infusion (has no  administration in time range)  senna (SENOKOT) tablet 8.6 mg (has no administration in time range)  lisinopril (ZESTRIL) tablet 20 mg (has no administration in time range)  hydrochlorothiazide (HYDRODIURIL) tablet 25 mg (has no administration in time range)  heparin bolus via infusion 5,500 Units (5,500 Units Intravenous Bolus from Bag 06/15/19 1624)  iohexol (OMNIPAQUE) 350 MG/ML injection 100 mL (68 mLs Intravenous Contrast Given 06/15/19 1552)     Initial Impression / Assessment and Plan / ED Course  I have reviewed the triage vital signs and the nursing notes.  Pertinent labs & imaging results that were available during my care of the patient were reviewed by me and considered in my medical decision making (see chart for details).     Ricky Meyers is a 73 year old male with history of prostate cancer, high cholesterol, hypertension who presents the ED with shortness of breath, chest pain.  Symptoms began today mostly but did have some chest pain and some mild shortness of breath yesterday but he developed more severe shortness of breath this morning that was worse with activity.  Does not have any current chest pain on exam now.  Has clear breath sounds on exam.  No edema in his legs.  No history of heart failure.  EKG shows sinus rhythm.  No new ischemic changes.  Unchanged from prior.  Does not have any active chest pain.  Patient intermittently hypoxic on my evaluation but I did ambulate the patient in the room when he was able to maintain his oxygen saturation.  He has some mild tachycardia.  Has had no history of PE before in the past.  Evaluation for shortness of breath and chest pain was performed with troponins.  Will go directly to CT scan to rule out PE given his symptoms and hypoxia.  Will test for coronavirus.  Patient with elevated troponin in the 200s.  Empirically started on IV heparin at this time as concern for ACS/PE.  BNP was mildly elevated.  Coronavirus test is negative.   Otherwise no significant anemia, electrolyte abnormality.  Repeat troponin continues to rise to 308.  Radiology called me on the phone to state that patient does have multiple pulmonary embolisms.  They are creating right heart strain.  Patient overall with a submassive PE however he is hemodynamically stable at this time.  He is tolerating IV heparin.  Will talk with intensivist about possible need for TPA but anticipate admission to hospitalist service for further care.  This chart was dictated using voice recognition software.  Despite best efforts to proofread,  errors can occur which can change the documentation meaning.    Final Clinical Impressions(s) / ED Diagnoses   Final diagnoses:  Acute pulmonary embolism, unspecified pulmonary embolism type, unspecified whether acute cor pulmonale present [facility])    ED Discharge Orders    None       Lennice Sites, DO 06/15/19 1932

## 2019-06-16 ENCOUNTER — Inpatient Hospital Stay (HOSPITAL_COMMUNITY): Payer: Medicare Other

## 2019-06-16 ENCOUNTER — Encounter (HOSPITAL_COMMUNITY): Payer: Self-pay | Admitting: General Practice

## 2019-06-16 DIAGNOSIS — I2699 Other pulmonary embolism without acute cor pulmonale: Secondary | ICD-10-CM

## 2019-06-16 DIAGNOSIS — I2694 Multiple subsegmental pulmonary emboli without acute cor pulmonale: Secondary | ICD-10-CM

## 2019-06-16 DIAGNOSIS — I340 Nonrheumatic mitral (valve) insufficiency: Secondary | ICD-10-CM

## 2019-06-16 LAB — HEPARIN LEVEL (UNFRACTIONATED)
Heparin Unfractionated: 0.86 IU/mL — ABNORMAL HIGH (ref 0.30–0.70)
Heparin Unfractionated: 0.78 IU/mL — ABNORMAL HIGH (ref 0.30–0.70)
Heparin Unfractionated: 0.57 IU/mL (ref 0.30–0.70)

## 2019-06-16 LAB — CBC
HCT: 36.4 % — ABNORMAL LOW (ref 39.0–52.0)
Hemoglobin: 12.3 g/dL — ABNORMAL LOW (ref 13.0–17.0)
MCH: 31.9 pg (ref 26.0–34.0)
MCHC: 33.8 g/dL (ref 30.0–36.0)
MCV: 94.5 fL (ref 80.0–100.0)
Platelets: 151 10*3/uL (ref 150–400)
RBC: 3.85 MIL/uL — ABNORMAL LOW (ref 4.22–5.81)
RDW: 15.1 % (ref 11.5–15.5)
WBC: 8.5 10*3/uL (ref 4.0–10.5)
nRBC: 0 % (ref 0.0–0.2)

## 2019-06-16 LAB — ECHOCARDIOGRAM COMPLETE
Height: 72 in
Weight: 3600 oz

## 2019-06-16 NOTE — ED Notes (Signed)
Tele Cleora Fleet Breakfast ordered

## 2019-06-16 NOTE — Plan of Care (Signed)

## 2019-06-16 NOTE — Progress Notes (Signed)
Bilateral lower extremity venous duplex completed. Refer to "CV Proc" under chart review to view preliminary results.  Critical results discussed with Dr. Cathlean Sauer and Marita [place], RN.  06/16/2019 2:26 PM Maudry Mayhew, MHA, RVT, RDCS, RDMS

## 2019-06-16 NOTE — Progress Notes (Signed)
ANTICOAGULATION CONSULT NOTE  Pharmacy Consult for heparin Indication: ACS vs PE  No Known Allergies  Patient Measurements: Height: 6' (182.9 cm) Weight: 225 lb (102.1 kg) IBW/kg (Calculated) : 77.6 Heparin Dosing Weight: 98.5kg  Vital Signs: Temp: 98.1 F (36.7 C) (08/12 2008) Temp Source: Oral (08/12 2008) BP: 110/64 (08/12 2008) Pulse Rate: 73 (08/12 2026)  Labs: Recent Labs    06/15/19 1215 06/15/19 1412 06/16/19 0111 06/16/19 0328 06/16/19 1150 06/16/19 2033  HGB 13.2  --   --  12.3*  --   --   HCT 39.3  --   --  36.4*  --   --   PLT 170  --   --  151  --   --   HEPARINUNFRC  --   --  0.86*  --  0.78* 0.57  CREATININE 1.10  --   --   --   --   --   TROPONINIHS 259* 308*  --   --   --   --     Estimated Creatinine Clearance: 71.7 mL/min (by C-G formula based on SCr of 1.1 mg/dL).   Assessment: 89 yom presented to the ED with CP and SOB. Troponin mildly elevated. To start IV heparin for possible ACS vs PE. CT of the chest is ordered. Baseline CBC is WNL. He is not on anticoagulation PTA.   Heparin level now therapeutic  Goal of Therapy:  Heparin level 0.3-0.7 units/ml Monitor platelets by anticoagulation protocol: Yes   Plan:  Continue heparin at 1200 units / hr Follow up AM labs  Anette Guarneri, PharmD Clinical Pharmacist Please check AMION for all [place] numbers 06/16/2019 9:07 PM

## 2019-06-16 NOTE — Progress Notes (Signed)
Ricky Meyers for heparin Indication: ACS vs PE  No Known Allergies  Patient Measurements: Height: 6' (182.9 cm) Weight: 225 lb (102.1 kg) IBW/kg (Calculated) : 77.6 Heparin Dosing Weight: 98.5kg  Vital Signs: BP: 113/83 (08/12 0100) Pulse Rate: 73 (08/12 0100)  Labs: Recent Labs    06/15/19 1215 06/15/19 1412 06/16/19 0111  HGB 13.2  --   --   HCT 39.3  --   --   PLT 170  --   --   HEPARINUNFRC  --   --  0.86*  CREATININE 1.10  --   --   TROPONINIHS 259* 308*  --     Estimated Creatinine Clearance: 71.7 mL/min (by C-G formula based on SCr of 1.1 mg/dL).   Medical History: Past Medical History:  Diagnosis Date  . Arthritis    neck and back   . Chronic neck pain   . Depression   . ED (erectile dysfunction)   . GERD (gastroesophageal reflux disease)    dysphagia  . Gout    sees Dr. Leigh Aurora   . Hyperlipidemia   . Hypertension   . OSA (obstructive sleep apnea) 12/18/2017   Severe with AHI at 70.1/hr and is now on CPAP at [place]  . Prostate cancer [facility])    history of  . Sleep apnea    snoring/never checked    Medications:  Infusions:  . sodium chloride    . sodium chloride    . sodium chloride    . heparin 1,500 Units/hr (06/15/19 1624)    Assessment: 49 yom presented to the ED with CP and SOB. Troponin mildly elevated. To start IV heparin for possible ACS vs PE. CT of the chest is ordered. Baseline CBC is WNL. He is not on anticoagulation PTA.   Initial heparin level supratherapeutic at 0.86, no bleeding noted at this time  Goal of Therapy:  Heparin level 0.3-0.7 units/ml Monitor platelets by anticoagulation protocol: Yes   Plan:  Decrease heparin gtt to 1350 units/hr F/u 8 hour heparin level  Bertis Ruddy, PharmD Clinical Pharmacist Please check AMION for all [place] numbers 06/16/2019 2:02 AM

## 2019-06-16 NOTE — Progress Notes (Signed)
PROGRESS NOTE    Ricky Meyers  ZSW:[id number] DOB: Nov 16, 1945 DOA: 06/15/2019 PCP: [name], MD    Brief Narrative:  73 year old male who presented with dyspnea.  He does have significant past medical history for hypertension, dyslipidemia and obesity.  Reported several months of declining energy and decreased exercise tolerance.  Worsening dyspnea on exertion to the point where he became dyspneic with minimal efforts.  On his initial physical examination blood pressure 128/79, heart rate 91, respiratory rate 19, oxygen saturation 96%, moist mucous membranes, lungs clear to auscultation bilaterally, heart S1-S2 present and rhythmic, abdomen soft nontender, no lower extremity edema. Chest x-ray negative for infiltrates.  CT chest with acute bilateral lobar and segmental pulmonary emboli, involving all lobes of both lungs, positive right heart strain, with RV/LV ratio 1.9.  EKG 108 bpm, right axis deviation, interventricular conduction delay, sinus rhythm, no ST segment changes, poor R wave progression, negative T waves in V5 to V6.  Patient was admitted to the hospital working diagnosis of acute pulmonary embolism, rule out submassive embolism  Assessment & Plan:   Active Problems:   Hyperlipemia   Essential hypertension   Pulmonary embolus ([place])   Pulmonary embolism ([place])   1. Acute pulmonary embolism, submassive. Patient with persistent dyspnea on exertion, blood pressure systolic is 322 to 025 mmHg, oxygenation 97% on 2 lpm. Will continue anticoagulation with IV heparin and follow on echocardiogram. Continue telemetry monitoring. Will dc iv fluids for now.   2. HTN. Continue to hold on antihypertensive medications.   3. Dyslipidemia. Continue statin therapy.   4. Gout. No active flare, continue with allopurinol.   5. Depression. Continue with paroxetine.   DVT prophylaxis: heparin   Code Status:  full Family Communication: I spoke with patient's wife at the bedside and  all questions were addressed.  Disposition Plan/ discharge barriers:  Pending clinical improvement and echocardiogram results.   Body mass index is 30.52 kg/m. Malnutrition Type:      Malnutrition Characteristics:      Nutrition Interventions:     RN Pressure Injury Documentation:     Consultants:     Procedures:     Antimicrobials:       Subjective: Patient continue to have dyspnea on exertion, no chest pain, no nausea or vomiting.   Objective: Vitals:   06/16/19 0609 06/16/19 0658 06/16/19 0932 06/16/19 1100  BP: 106/65 106/65 99/64 125/78  Pulse:  74 72 66  Resp: 15 14 16 14   Temp:    97.6 F (36.4 C)  TempSrc:    Oral  SpO2:  97% 97% 97%  Weight:      Height:        Intake/Output Summary (Last 24 hours) at 06/16/2019 1541 Last data filed at 06/16/2019 1449 Gross per 24 hour  Intake 240 ml  Output 900 ml  Net -660 ml   Filed Weights   06/15/19 1210  Weight: 102.1 kg    Examination:   General: deconditioned  Neurology: Awake and alert, non focal  E ENT: mild pallor, no icterus, oral mucosa moist Cardiovascular: No JVD. S1-S2 present, rhythmic, no gallops, rubs, or murmurs. No lower extremity edema. Pulmonary:  positive breath sounds bilaterally, adequate air movement, no wheezing, rhonchi or rales. Gastrointestinal. Abdomen with no organomegaly, non tender, no rebound or guarding Skin. No rashes Musculoskeletal: no joint deformities     Data Reviewed: I have personally reviewed following labs and imaging studies  CBC: Recent Labs  Lab 06/15/19 1215 06/16/19 0328  WBC 9.8 8.5  HGB 13.2 12.3*  HCT 39.3 36.4*  MCV 94.2 94.5  PLT 170 295   Basic Metabolic Panel: Recent Labs  Lab 06/15/19 1215  NA 139  K 4.2  CL 107  CO2 22  GLUCOSE 118*  BUN 24*  CREATININE 1.10  CALCIUM 9.7   GFR: Estimated Creatinine Clearance: 71.7 mL/min (by C-G formula based on SCr of 1.1 mg/dL). Liver Function Tests: No results for input(s):  AST, ALT, ALKPHOS, BILITOT, PROT, ALBUMIN in the last 168 hours. No results for input(s): LIPASE, AMYLASE in the last 168 hours. No results for input(s): AMMONIA in the last 168 hours. Coagulation Profile: No results for input(s): INR, PROTIME in the last 168 hours. Cardiac Enzymes: No results for input(s): CKTOTAL, CKMB, CKMBINDEX, TROPONINI in the last 168 hours. BNP (last 3 results) No results for input(s): PROBNP in the last 8760 hours. HbA1C: No results for input(s): HGBA1C in the last 72 hours. CBG: No results for input(s): GLUCAP in the last 168 hours. Lipid Profile: No results for input(s): CHOL, HDL, LDLCALC, TRIG, CHOLHDL, LDLDIRECT in the last 72 hours. Thyroid Function Tests: No results for input(s): TSH, T4TOTAL, FREET4, T3FREE, THYROIDAB in the last 72 hours. Anemia Panel: No results for input(s): VITAMINB12, FOLATE, FERRITIN, TIBC, IRON, RETICCTPCT in the last 72 hours.    Radiology Studies: I have reviewed all of the imaging during this hospital visit personally     Scheduled Meds: . allopurinol  300 mg Oral Daily  . amLODipine  5 mg Oral Daily  . atorvastatin  40 mg Oral q1800  . gabapentin  100 mg Oral BID  . hydrochlorothiazide  25 mg Oral Daily  . lisinopril  20 mg Oral Daily  . naproxen  500 mg Oral BID WC  . PARoxetine  20 mg Oral Daily  . senna  1 tablet Oral BID  . sodium chloride flush  3 mL Intravenous Once   Continuous Infusions: . sodium chloride 50 mL/hr at 06/16/19 1128  . heparin 1,200 Units/hr (06/16/19 1245)     LOS: 1 day        Angellina Ferdinand Gerome Apley, MD

## 2019-06-16 NOTE — Progress Notes (Signed)
ANTICOAGULATION CONSULT NOTE  Pharmacy Consult for heparin Indication: PE  No Known Allergies  Patient Measurements: Height: 6' (182.9 cm) Weight: 225 lb (102.1 kg) IBW/kg (Calculated) : 77.6 Heparin Dosing Weight: 98.5kg  Vital Signs: Temp: 97.6 F (36.4 C) (08/12 1100) Temp Source: Oral (08/12 1100) BP: 125/78 (08/12 1100) Pulse Rate: 66 (08/12 1100)  Labs: Recent Labs    06/15/19 1215 06/15/19 1412 06/16/19 0111 06/16/19 0328 06/16/19 1150  HGB 13.2  --   --  12.3*  --   HCT 39.3  --   --  36.4*  --   PLT 170  --   --  151  --   HEPARINUNFRC  --   --  0.86*  --  0.78*  CREATININE 1.10  --   --   --   --   TROPONINIHS 259* 308*  --   --   --     Estimated Creatinine Clearance: 71.7 mL/min (by C-G formula based on SCr of 1.1 mg/dL).   Medical History: Past Medical History:  Diagnosis Date  . Arthritis    neck and back   . Chronic neck pain   . Depression   . ED (erectile dysfunction)   . GERD (gastroesophageal reflux disease)    dysphagia  . Gout    sees Dr. Leigh Aurora   . Hyperlipidemia   . Hypertension   . OSA (obstructive sleep apnea) 12/18/2017   Severe with AHI at 70.1/hr and is now on CPAP at [place]  . Prostate cancer [facility])    history of  . Pulmonary embolism ([place]) 06/2019  . Sleep apnea    snoring/never checked    Medications:  Infusions:  . sodium chloride 50 mL/hr at 06/16/19 1128  . heparin 1,350 Units/hr (06/16/19 0425)    Assessment: 72 yom presented to the ED with CP and SOB. CT shows PE. Pharmacy dosing heparin. Plans noted for Xarelto -heparin level= 0.78  Goal of Therapy:  Heparin level 0.3-0.7 units/ml Monitor platelets by anticoagulation protocol: Yes   Plan:  -Decrease heparin gtt to 1200 units/hr -Heparin level in 6 hours and daily wth CBC daily  Hildred Laser, PharmD Clinical Pharmacist **Pharmacist phone directory can now be found on [email or web address] ([email or web address]).  Listed under [place].

## 2019-06-16 NOTE — ED Notes (Signed)
Pt up to void . Given his 10 am meds requested pain meds which were given

## 2019-06-16 NOTE — ED Notes (Signed)
Wife sitting with pt , she went to get coffee, pt states bed is terrible assisted up in chair to read paper given  Urinal, pt washed face and hands, breakfast arrived, pt on o2 a3 lnc

## 2019-06-17 ENCOUNTER — Ambulatory Visit: Payer: Medicare Other | Admitting: Cardiology

## 2019-06-17 DIAGNOSIS — E669 Obesity, unspecified: Secondary | ICD-10-CM

## 2019-06-17 LAB — CBC
HCT: 36.6 % — ABNORMAL LOW (ref 39.0–52.0)
Hemoglobin: 12.2 g/dL — ABNORMAL LOW (ref 13.0–17.0)
MCH: 31.9 pg (ref 26.0–34.0)
MCHC: 33.3 g/dL (ref 30.0–36.0)
MCV: 95.6 fL (ref 80.0–100.0)
Platelets: 142 10*3/uL — ABNORMAL LOW (ref 150–400)
RBC: 3.83 MIL/uL — ABNORMAL LOW (ref 4.22–5.81)
RDW: 15 % (ref 11.5–15.5)
WBC: 7.6 10*3/uL (ref 4.0–10.5)
nRBC: 0 % (ref 0.0–0.2)

## 2019-06-17 LAB — BASIC METABOLIC PANEL
Anion gap: 7 (ref 5–15)
BUN: 19 mg/dL (ref 8–23)
CO2: 28 mmol/L (ref 22–32)
Calcium: 9.4 mg/dL (ref 8.9–10.3)
Chloride: 105 mmol/L (ref 98–111)
Creatinine, Ser: 1.19 mg/dL (ref 0.61–1.24)
GFR calc Af Amer: >60 mL/min (ref >60)
GFR calc non Af Amer: 59 mL/min — ABNORMAL LOW (ref >60)
Glucose, Bld: 103 mg/dL — ABNORMAL HIGH (ref 70–99)
Potassium: 4.1 mmol/L (ref 3.5–5.1)
Sodium: 140 mmol/L (ref 135–145)

## 2019-06-17 LAB — HEPARIN LEVEL (UNFRACTIONATED): Heparin Unfractionated: 0.54 IU/mL (ref 0.30–0.70)

## 2019-06-17 MED ORDER — RIVAROXABAN (XARELTO) VTE STARTER PACK (15 & 20 MG): ORAL_TABLET | ORAL | 0 refills | Status: DC

## 2019-06-17 MED ORDER — RIVAROXABAN 15 MG PO TABS
15.0000 mg | ORAL_TABLET | Freq: Two times a day (BID) | ORAL | Status: DC
  Administered 2019-06-17 – 2019-06-18 (×2): 15 mg via ORAL
  Filled 2019-06-17 (×2): qty 1

## 2019-06-17 MED ORDER — BISACODYL 5 MG PO TBEC
10.0000 mg | DELAYED_RELEASE_TABLET | Freq: Once | ORAL | Status: DC
  Filled 2019-06-17: qty 2

## 2019-06-17 MED FILL — XARELTO STARTER PACK: 15 & 20 | 30 days supply | Qty: 51 | Fill #0

## 2019-06-17 NOTE — Progress Notes (Signed)
ANTICOAGULATION CONSULT NOTE  Pharmacy Consult for heparin Indication: PE  No Known Allergies  Patient Measurements: Height: 6' (182.9 cm) Weight: 225 lb (102.1 kg) IBW/kg (Calculated) : 77.6 Heparin Dosing Weight: 98.5kg  Vital Signs: Temp: 97.5 F (36.4 C) (08/13 0437) Temp Source: Oral (08/13 0437) BP: 104/61 (08/13 0437) Pulse Rate: 66 (08/13 0437)  Labs: Recent Labs    06/15/19 1215 06/15/19 1412  06/16/19 0328 06/16/19 1150 06/16/19 2033 06/17/19 0605  HGB 13.2  --   --  12.3*  --   --  12.2*  HCT 39.3  --   --  36.4*  --   --  36.6*  PLT 170  --   --  151  --   --  142*  HEPARINUNFRC  --   --    < >  --  0.78* 0.57 0.54  CREATININE 1.10  --   --   --   --   --  1.19  TROPONINIHS 259* 308*  --   --   --   --   --    < > = values in this interval not displayed.    Estimated Creatinine Clearance: 66.3 mL/min (by C-G formula based on SCr of 1.19 mg/dL).   Medical History: Past Medical History:  Diagnosis Date  . Arthritis    neck and back   . Chronic neck pain   . Depression   . ED (erectile dysfunction)   . GERD (gastroesophageal reflux disease)    dysphagia  . Gout    sees Dr. Leigh Aurora   . Hyperlipidemia   . Hypertension   . OSA (obstructive sleep apnea) 12/18/2017   Severe with AHI at 70.1/hr and is now on CPAP at [place]  . Prostate cancer [facility])    history of  . Pulmonary embolism ([place]) 06/2019  . Sleep apnea    snoring/never checked    Medications:  Infusions:  . heparin 1,200 Units/hr (06/17/19 0145)    Assessment: 22 yom presented to the ED with CP and SOB. CT shows PE and doppler w/ LLE DVT. Pharmacy dosing heparin. Plans noted for Xarelto -heparin level= 0.54  Goal of Therapy:  Heparin level 0.3-0.7 units/ml Monitor platelets by anticoagulation protocol: Yes   Plan:  -No heparin changes needed -Heparin level with CBC daily -Will follow plans for oral anticoagulation  Hildred Laser, PharmD Clinical  Pharmacist **Pharmacist phone directory can now be found on [email or web address] ([email or web address]).  Listed under [place].

## 2019-06-17 NOTE — Progress Notes (Addendum)
PROGRESS NOTE    Ricky Meyers  MVH:[id number] DOB: 1946/06/24 DOA: 06/15/2019 PCP: [name], MD    Brief Narrative:  73 year old male who presented with dyspnea.  He does have significant past medical history for hypertension, dyslipidemia and obesity.  Reported several months of declining energy and decreased exercise tolerance.  Worsening dyspnea on exertion to the point where he became dyspneic with minimal efforts.  On his initial physical examination blood pressure 128/79, heart rate 91, respiratory rate 19, oxygen saturation 96%, moist mucous membranes, lungs clear to auscultation bilaterally, heart S1-S2 present and rhythmic, abdomen soft nontender, no lower extremity edema. Chest x-ray negative for infiltrates.  CT chest with acute bilateral lobar and segmental pulmonary emboli, involving all lobes of both lungs, positive right heart strain, with RV/LV ratio 1.9.  EKG 108 bpm, right axis deviation, interventricular conduction delay, sinus rhythm, no ST segment changes, poor R wave progression, negative T waves in V5 to V6.  Patient was admitted to the hospital working diagnosis of acute pulmonary embolism, rule out submassive embolism    Assessment & Plan:   Active Problems:   Hyperlipemia   Essential hypertension   Pulmonary embolus ([place])   Pulmonary embolism ([place])    1. Acute pulmonary embolism, submassive/ left femoral and left posterior tibial veins deep vein thrombosis. Improved dyspnea, patient has not been out of bed yet. Tolerating well anticoagulation with heparin. Echocardiogram with preserved LV systolic function with RV moderate reduced systolic function and mildly enlarged cavity. Oxygenation 97 to 98 % on room air. [place] lower extremities, with positive DVT on the left femoral and posterior tibial veins. Will transition to oral anticoagulation with rivaroxaban. Follow with physical therapy evaluation.   2. HTN. Blood pressure 841 to 324 systolic, continue  with amlodipine.  3. Dyslipidemia. On statin therapy.   4. Gout. On allopurinol. No active flare.   5. Depression. On paroxetine.   6. Obesity. Calculated bmi is 30.5  DVT prophylaxis: heparin   Code Status:  full Family Communication: I spoke with patient's wife at the bedside and all questions were addressed.  Disposition Plan/ discharge barriers:  Plan for dc in am, after physical therapy evaluation.    Body mass index is 30.56 kg/m. Malnutrition Type:      Malnutrition Characteristics:      Nutrition Interventions:     RN Pressure Injury Documentation:     Consultants:     Procedures:     Antimicrobials:       Subjective: Patient is feeling better, but not has bee out of bed yet, no dyspnea at rest, no nausea or vomiting, no chest pain.   Objective: Vitals:   06/17/19 0000 06/17/19 0437 06/17/19 1029 06/17/19 1410  BP:  104/61 106/61 113/84  Pulse:  66  76  Resp: 15 15  (!) 24  Temp:  (!) 97.5 F (36.4 C) 97.9 F (36.6 C) 97.8 F (36.6 C)  TempSrc:  Oral Oral Oral  SpO2:  97% 98% 97%  Weight:    102.2 kg  Height:    6' (1.829 m)    Intake/Output Summary (Last 24 hours) at 06/17/2019 1502 Last data filed at 06/17/2019 0827 Gross per 24 hour  Intake 395.15 ml  Output 1800 ml  Net -1404.85 ml   Filed Weights   06/15/19 1210 06/17/19 1410  Weight: 102.1 kg 102.2 kg    Examination:   General: deconditioned  Neurology: Awake and alert, non focal  E ENT: mild pallor, no icterus,  oral mucosa moist Cardiovascular: No JVD. S1-S2 present, rhythmic, no gallops, rubs, or murmurs. No lower extremity edema. Pulmonary: positive breath sounds bilaterally, adequate air movement, no wheezing, rhonchi or rales. Gastrointestinal. Abdomen with no organomegaly, non tender, no rebound or guarding Skin. No rashes Musculoskeletal: no joint deformities     Data Reviewed: I have personally reviewed following labs and imaging studies  CBC:  Recent Labs  Lab 06/15/19 1215 06/16/19 0328 06/17/19 0605  WBC 9.8 8.5 7.6  HGB 13.2 12.3* 12.2*  HCT 39.3 36.4* 36.6*  MCV 94.2 94.5 95.6  PLT 170 151 710*   Basic Metabolic Panel: Recent Labs  Lab 06/15/19 1215 06/17/19 0605  NA 139 140  K 4.2 4.1  CL 107 105  CO2 22 28  GLUCOSE 118* 103*  BUN 24* 19  CREATININE 1.10 1.19  CALCIUM 9.7 9.4   GFR: Estimated Creatinine Clearance: 66.3 mL/min (by C-G formula based on SCr of 1.19 mg/dL). Liver Function Tests: No results for input(s): AST, ALT, ALKPHOS, BILITOT, PROT, ALBUMIN in the last 168 hours. No results for input(s): LIPASE, AMYLASE in the last 168 hours. No results for input(s): AMMONIA in the last 168 hours. Coagulation Profile: No results for input(s): INR, PROTIME in the last 168 hours. Cardiac Enzymes: No results for input(s): CKTOTAL, CKMB, CKMBINDEX, TROPONINI in the last 168 hours. BNP (last 3 results) No results for input(s): PROBNP in the last 8760 hours. HbA1C: No results for input(s): HGBA1C in the last 72 hours. CBG: No results for input(s): GLUCAP in the last 168 hours. Lipid Profile: No results for input(s): CHOL, HDL, LDLCALC, TRIG, CHOLHDL, LDLDIRECT in the last 72 hours. Thyroid Function Tests: No results for input(s): TSH, T4TOTAL, FREET4, T3FREE, THYROIDAB in the last 72 hours. Anemia Panel: No results for input(s): VITAMINB12, FOLATE, FERRITIN, TIBC, IRON, RETICCTPCT in the last 72 hours.    Radiology Studies: I have reviewed all of the imaging during this hospital visit personally     Scheduled Meds: . allopurinol  300 mg Oral Daily  . amLODipine  5 mg Oral Daily  . atorvastatin  40 mg Oral q1800  . gabapentin  100 mg Oral BID  . naproxen  500 mg Oral BID WC  . PARoxetine  20 mg Oral Daily  . senna  1 tablet Oral BID  . sodium chloride flush  3 mL Intravenous Once   Continuous Infusions: . heparin 1,200 Units/hr (06/17/19 0145)     LOS: 2 days        [place]cah Dulany  Gerome Apley, MD

## 2019-06-17 NOTE — Progress Notes (Signed)
Transfer  Report called to receiving RN on 4E. Pt sent by wheelchair to unit.

## 2019-06-17 NOTE — Progress Notes (Addendum)
[facility] for heparin to Xarelto Indication: PE  No Known Allergies  Patient Measurements: Height: 6' (182.9 cm) Weight: 225 lb 5 oz (102.2 kg) IBW/kg (Calculated) : 77.6 Heparin Dosing Weight: 98.5kg  Vital Signs: Temp: 97.8 F (36.6 C) (08/13 1410) Temp Source: Oral (08/13 1410) BP: 113/84 (08/13 1410) Pulse Rate: 76 (08/13 1410)  Labs: Recent Labs    06/15/19 1215 06/15/19 1412  06/16/19 0328 06/16/19 1150 06/16/19 2033 06/17/19 0605  HGB 13.2  --   --  12.3*  --   --  12.2*  HCT 39.3  --   --  36.4*  --   --  36.6*  PLT 170  --   --  151  --   --  142*  HEPARINUNFRC  --   --    < >  --  0.78* 0.57 0.54  CREATININE 1.10  --   --   --   --   --  1.19  TROPONINIHS 259* 308*  --   --   --   --   --    < > = values in this interval not displayed.    Estimated Creatinine Clearance: 66.3 mL/min (by C-G formula based on SCr of 1.19 mg/dL).   Medical History: Past Medical History:  Diagnosis Date  . Arthritis    neck and back   . Chronic neck pain   . Depression   . ED (erectile dysfunction)   . GERD (gastroesophageal reflux disease)    dysphagia  . Gout    sees Dr. Leigh Aurora   . Hyperlipidemia   . Hypertension   . OSA (obstructive sleep apnea) 12/18/2017   Severe with AHI at 70.1/hr and is now on CPAP at [place]  . Prostate cancer [facility])    history of  . Pulmonary embolism ([place]) 06/2019  . Sleep apnea    snoring/never checked    Assessment: 20 yom presented to the ED with CP and SOB. CT shows PE and doppler w/ LLE DVT. Pharmacy dosing heparin. Plans noted for Xarelto  Heparin to Xarelto  Goal of Therapy:  Heparin level 0.3-0.7 units/ml Monitor platelets by anticoagulation protocol: Yes   Plan:  DC heparin  Xarelto 15 mg po BID x 3 weeks then 20 mg po daily  Xarelto discharge prescription sent to [facility]  Thank you Anette Guarneri, PharmD **Pharmacist phone directory can now be found on [email or web address] ([email or web address]).  Listed under [place].

## 2019-06-17 NOTE — Evaluation (Signed)
Physical Therapy Evaluation Patient Details Name: Ricky Meyers MRN: [id number] DOB: 1946/04/17 Today's Date: 06/17/2019   History of Present Illness  Pt adm with SOB and found to have PE and LLE DVT. PMH - HTN, gout  Clinical Impression  Pt doing well with mobility and no further PT needed.  Ready for dc from PT standpoint.      Follow Up Recommendations No PT follow up    Equipment Recommendations  None recommended by PT    Recommendations for Other Services       Precautions / Restrictions Precautions Precautions: None Restrictions Weight Bearing Restrictions: No      Mobility  Bed Mobility Overal bed mobility: Independent                Transfers Overall transfer level: Independent Equipment used: None                Ambulation/Gait Ambulation/Gait assistance: Independent Gait Distance (Feet): 400 Feet Assistive device: None Gait Pattern/deviations: WFL(Within Functional Limits) Gait velocity: normal Gait velocity interpretation: >4.37 ft/sec, indicative of normal walking speed General Gait Details: Steady gait. Amb on RA with SpO2 97% and dyspnea 2/4.  Stairs            Wheelchair Mobility    Modified Rankin (Stroke Patients Only)       Balance Overall balance assessment: No apparent balance deficits (not formally assessed)                                           Pertinent Vitals/Pain Pain Assessment: No/denies pain    Home Living Family/patient expects to be discharged to:: Private residence Living Arrangements: Spouse/significant other Available Help at Discharge: Family;Available PRN/intermittently Type of Home: House Home Access: Stairs to enter Entrance Stairs-Rails: None Entrance Stairs-Number of Steps: 4   Home Equipment: Environmental consultant - 2 wheels      Prior Function Level of Independence: Independent               Hand Dominance        Extremity/Trunk Assessment   Upper Extremity  Assessment Upper Extremity Assessment: Overall WFL for tasks assessed    Lower Extremity Assessment Lower Extremity Assessment: Overall WFL for tasks assessed       Communication   Communication: No difficulties  Cognition Arousal/Alertness: Awake/alert Behavior During Therapy: WFL for tasks assessed/performed Overall Cognitive Status: Within Functional Limits for tasks assessed                                        General Comments      Exercises     Assessment/Plan    PT Assessment Patent does not need any further PT services  PT Problem List         PT Treatment Interventions      PT Goals (Current goals can be found in the Care Plan section)  Acute Rehab PT Goals PT Goal Formulation: All assessment and education complete, DC therapy    Frequency     Barriers to discharge        Co-evaluation               AM-PAC PT "6 Clicks" Mobility  Outcome Measure Help needed turning from your back to your side while in a flat bed  without using bedrails?: None Help needed moving from lying on your back to sitting on the side of a flat bed without using bedrails?: None Help needed moving to and from a bed to a chair (including a wheelchair)?: None Help needed standing up from a chair using your arms (e.g., wheelchair or bedside chair)?: None Help needed to walk in hospital room?: None Help needed climbing 3-5 steps with a railing? : None 6 Click Score: 24    End of Session   Activity Tolerance: Patient tolerated treatment well Patient left: in bed;with call bell/phone within reach Nurse Communication: Mobility status PT Visit Diagnosis: Other abnormalities of gait and mobility (R26.89)    Time: 6433-2951 PT Time Calculation (min) (ACUTE ONLY): 14 min   Charges:   PT Evaluation $PT Eval Low Complexity: [place] Pager [phone] Office [place] 06/17/2019, 5:05 PM

## 2019-06-18 ENCOUNTER — Encounter: Payer: Self-pay | Admitting: Family Medicine

## 2019-06-18 DIAGNOSIS — I82412 Acute embolism and thrombosis of left femoral vein: Secondary | ICD-10-CM

## 2019-06-18 NOTE — Discharge Summary (Addendum)
Physician Discharge Summary  Ricky Meyers MVH:[id number] DOB: February 15, 1946 DOA: 06/15/2019  PCP: [name], MD  Admit date: 06/15/2019 Discharge date: 06/18/2019  Admitted From: Home  Disposition:  Home   Recommendations for Outpatient Follow-up and new medication changes:  1. Follow up with Dr. Sarajane Jews in 7 days.  2. Patient has been placed on Rivaroxaban for anticoagulation.   Home Health: No   Equipment/Devices: no    Discharge Condition: stable  CODE STATUS: full  Diet recommendation: heart healthy   Brief/Interim Summary: 73 year old male who presented with dyspnea. He does have significant past medical history for hypertension, dyslipidemia and obesity. Reported several months of declining energy and decreased exercise tolerance. Worsening dyspnea on exertion to the point where he became dyspneic with minimal efforts. On his initial physical examination blood pressure 128/79, heart rate 91, respiratory rate 19, oxygen saturation 96%, moist mucous membranes, lungs clear to auscultation bilaterally, heart S1-S2 present and rhythmic, abdomen soft nontender, no lower extremity edema. Sodium 139, potassium 4.2, chloride 107, bicarb 22, glucose 118, BUN 24, creatinine 1.1, high sensitive troponin 259, white count 9.8, hemoglobin 13.2, hematocrit 39.3, platelets 170.  SARS COVID-19 was negative Chest x-ray negative for infiltrates. CT chest with acute bilateral lobar and segmental pulmonary emboli, involving all lobes of both lungs, positive right heart strain, with RV/LV ratio 1.9.EKG 108 bpm, right axis deviation, interventricular conduction delay, sinus rhythm, no ST segment changes, poor R wave progression, negative T waves in V5 to V6.  Patient was admitted to the hospital working diagnosis of acute submassive pulmonary embolism.   1.  Acute pulmonary embolism, submassive,/ acute left femoral and left posterior tibial veins deep vein thrombosis. Complicated with acute core  pulmonale. Patient was admitted to the medical telemetry unit, he received anticoagulation with unfractionated IV heparin with good toleration.  His blood pressure remained stable, with systolic between 841 and 324 mmHG, his oxygenation was 95 to 97% on room air.  Further work-up with echocardiography showed a normal LV systolic function, ejection fraction 55 to 60%, the right ventricle systolic function was moderately reduced and the cavity was mildly enlarged.  It was noted left with bowing the interatrial septum, suggestive of elevated right atrial pressure.  Ultrasonography of his lower extremities showed acute deep vein thrombosis involving the left femoral vein and left posterior tibial veins.  Right lower extremity negative for deep vein thrombosis.  Patient was able to ambulate with physical therapy with no difficulties.  Patient has been transitioned to oral anticoagulation with rivaroxaban.  He will follow-up as an outpatient within 7 days.  2.  Hypertension.  Blood pressure remained well controlled, patient was continued on amlodipine, at discharge he will resume  3.  Dyslipidemia.  Continue statin therapy.  4.  Gout.  No acute flare, continue allopurinol.  5.  Depression.  Continue paroxetine.  6.  Obesity.  His calculated BMI is 30.5.     Discharge Diagnoses:  Active Problems:   Hyperlipemia   Essential hypertension   Pulmonary embolus ([place])   Pulmonary embolism ([place])    Discharge Instructions   Allergies as of 06/18/2019   No Known Allergies     Medication List    TAKE these medications   allopurinol 300 MG tablet Commonly known as: ZYLOPRIM TAKE 1 TABLET BY MOUTH EVERY DAY   amLODipine 5 MG tablet Commonly known as: NORVASC TAKE 1 TABLET BY MOUTH EVERY DAY   atorvastatin 40 MG tablet Commonly known as: LIPITOR TAKE 1 TABLET BY  MOUTH EVERY DAY What changed: when to take this   cyclobenzaprine 10 MG tablet Commonly known as: FLEXERIL TAKE 1 TABLET BY  MOUTH 3 TIMES DAILY AS NEEDED FOR MUSCLE SPASMS What changed:   how much to take  how to take this  when to take this  reasons to take this  additional instructions   gabapentin 100 MG capsule Commonly known as: NEURONTIN TAKE 1 CAPSULE BY MOUTH 2 TIMES DAILY What changed: See the new instructions.   lisinopril-hydrochlorothiazide 20-25 MG tablet Commonly known as: ZESTORETIC TAKE 1 TABLET BY MOUTH EVERY DAY   naproxen 500 MG tablet Commonly known as: NAPROSYN TAKE 1 TABLET BY MOUTH TWICE A DAY WITH A MEAL What changed: See the new instructions.   oxyCODONE-acetaminophen 10-325 MG tablet Commonly known as: PERCOCET Take 1 tablet by mouth every 6 (six) hours as needed for up to 30 days for pain. Start taking on: June 19, 2019   PARoxetine 20 MG tablet Commonly known as: PAXIL TAKE 1 TABLET BY MOUTH EVERY MORNING What changed: when to take this   psyllium 58.6 % packet Commonly known as: METAMUCIL Take 1 packet by mouth daily as needed (for constipation).   Rivaroxaban 15 & 20 MG Tbpk Take as directed on package       No Known Allergies  Consultations:     Procedures/Studies: Dg Chest 2 View  Result Date: 06/15/2019 CLINICAL DATA:  Shortness of breath and chest pain EXAM: CHEST - 2 VIEW COMPARISON:  June 10, 2017 FINDINGS: There is no edema or consolidation. Heart size and pulmonary vascularity are normal. No adenopathy. There is postoperative change in the lower cervical region. There is anterior wedging of lower thoracic vertebral bodies. No pneumothorax. IMPRESSION: No edema or consolidation.  Stable cardiac silhouette. Electronically Signed   By: Lowella Grip III M.D.   On: 06/15/2019 13:06   Ct Angio Chest Pe W And/or Wo Contrast  Result Date: 06/15/2019 CLINICAL DATA:  Chest pain and shortness of breath. EXAM: CT ANGIOGRAPHY CHEST WITH CONTRAST TECHNIQUE: Multidetector CT imaging of the chest was performed using the standard protocol during  bolus administration of intravenous contrast. Multiplanar CT image reconstructions and MIPs were obtained to evaluate the vascular anatomy. CONTRAST:  67mL OMNIPAQUE IOHEXOL 350 MG/ML SOLN COMPARISON:  CTA chest dated June 11, 2017. FINDINGS: Cardiovascular: Acute bilateral lobar and segmental pulmonary emboli involving all lobes of both lungs. Evidence of right heart strain with RV/LV ratio of 1.9. Reflux of contrast into the intrahepatic IVC. Normal heart size. No pericardial effusion. Coronary, aortic arch, and branch vessel atherosclerotic vascular disease. No thoracic aortic aneurysm. Mediastinum/Nodes: No enlarged mediastinal, hilar, or axillary lymph nodes. Thyroid gland, trachea, and esophagus demonstrate no significant findings. Lungs/Pleura: Minimal dependent subsegmental atelectasis. No focal consolidation, pleural effusion, or pneumothorax. No suspicious pulmonary nodule. Upper Abdomen: No acute abnormality. Unchanged small simple cyst in the right hepatic lobe. Musculoskeletal: No chest wall abnormality. No acute or significant osseous findings. Chronic mild lower thoracic compression deformities. Review of the MIP images confirms the above findings. IMPRESSION: 1. Positive for acute PE with CT evidence of right heart strain (RV/LV Ratio = 1.9) consistent with at least submassive (intermediate risk) PE. The presence of right heart strain has been associated with an increased risk of morbidity and mortality. Please activate Code PE by paging [phone]. 2.  Aortic atherosclerosis (ICD10-I70.0). Critical Value/emergent results were called by telephone at the time of interpretation on 06/15/2019 at 4:20 pm to Dr. Lennice Sites , who  verbally acknowledged these results. Electronically Signed   By: Titus Dubin M.D.   On: 06/15/2019 16:21   Vas [place] Lower Extremity Venous (dvt)  Result Date: 06/17/2019  Lower Venous Study Indications: Pulmonary embolism.  Comparison Study: No prior study Performing  Technologist: Maudry Mayhew MHA, RDMS, RVT, RDCS  Examination Guidelines: A complete evaluation includes B-mode imaging, spectral Doppler, color Doppler, and power Doppler as needed of all accessible portions of each vessel. Bilateral testing is considered an integral part of a complete examination. Limited examinations for reoccurring indications may be performed as noted.  +---------+---------------+---------+-----------+----------+-------+ RIGHT    CompressibilityPhasicitySpontaneityPropertiesSummary +---------+---------------+---------+-----------+----------+-------+ CFV      Full           No       Yes                          +---------+---------------+---------+-----------+----------+-------+ SFJ      Full                                                 +---------+---------------+---------+-----------+----------+-------+ FV Prox  Full                                                 +---------+---------------+---------+-----------+----------+-------+ FV Mid   Full                                                 +---------+---------------+---------+-----------+----------+-------+ FV DistalFull                                                 +---------+---------------+---------+-----------+----------+-------+ PFV      Full                                                 +---------+---------------+---------+-----------+----------+-------+ POP      Full           No       Yes                          +---------+---------------+---------+-----------+----------+-------+ PTV      Full                                                 +---------+---------------+---------+-----------+----------+-------+ PERO     Full                                                 +---------+---------------+---------+-----------+----------+-------+   +---------+---------------+---------+-----------+----------+------------------+ LEFT      CompressibilityPhasicitySpontaneityPropertiesSummary            +---------+---------------+---------+-----------+----------+------------------+  CFV      Full           No       Yes                                     +---------+---------------+---------+-----------+----------+------------------+ SFJ      Full                                                            +---------+---------------+---------+-----------+----------+------------------+ FV Prox  Full                                                            +---------+---------------+---------+-----------+----------+------------------+ FV Mid   Full                                                            +---------+---------------+---------+-----------+----------+------------------+ FV DistalNone                                         Acute              +---------+---------------+---------+-----------+----------+------------------+ PFV      Full                                                            +---------+---------------+---------+-----------+----------+------------------+ POP      Full                                                            +---------+---------------+---------+-----------+----------+------------------+ PTV      Partial        No       No                   Acute              +---------+---------------+---------+-----------+----------+------------------+ PERO                                                  Limited  visualization      +---------+---------------+---------+-----------+----------+------------------+     Summary: Right: There is no evidence of deep vein thrombosis in the lower extremity. No cystic structure found in the popliteal fossa. Left: Findings consistent with acute deep vein thrombosis involving the left femoral vein, and left posterior tibial veins. No cystic  structure found in the popliteal fossa.  *See table(s) above for measurements and observations. Electronically signed by Curt Jews MD on 06/17/2019 at 7:14:07 AM.    Final       Procedures:   Subjective: Patient is feeling better, no dyspnea or chest pain, no nausea or vomiting. Has been out off bed with no difficulties.   Discharge Exam: Vitals:   06/17/19 2012 06/18/19 0538  BP: (!) 146/77 127/77  Pulse: 71 66  Resp: 19 18  Temp: 97.7 F (36.5 C) 98 F (36.7 C)  SpO2: 95% 97%   Vitals:   06/17/19 1029 06/17/19 1410 06/17/19 2012 06/18/19 0538  BP: 106/61 113/84 (!) 146/77 127/77  Pulse:  76 71 66  Resp:  (!) 24 19 18   Temp: 97.9 F (36.6 C) 97.8 F (36.6 C) 97.7 F (36.5 C) 98 F (36.7 C)  TempSrc: Oral Oral Oral Oral  SpO2: 98% 97% 95% 97%  Weight:  102.2 kg    Height:  6' (1.829 m)      General: Not in pain or dyspnea Neurology: Awake and alert, non focal  E ENT: no pallor, no icterus, oral mucosa moist Cardiovascular: No JVD. S1-S2 present, rhythmic, no gallops, rubs, or murmurs. No lower extremity edema. Pulmonary: positive breath sounds bilaterally, adequate air movement, no wheezing, rhonchi or rales. Gastrointestinal. Abdomen with no organomegaly, non tender, no rebound or guarding Skin. No rashes Musculoskeletal: no joint deformities   The results of significant diagnostics from this hospitalization (including imaging, microbiology, ancillary and laboratory) are listed below for reference.     Microbiology: Recent Results (from the past 240 hour(s))  SARS Coronavirus 2 [facility] order, Performed in [facility] hospital lab) Nasopharyngeal Nasopharyngeal Swab     Status: None   Collection Time: 06/15/19  1:47 PM   Specimen: Nasopharyngeal Swab  Result Value Ref Range Status   SARS Coronavirus 2 NEGATIVE NEGATIVE Final    Comment: (NOTE) If result is NEGATIVE SARS-CoV-2 target nucleic acids are NOT DETECTED. The SARS-CoV-2 RNA is generally  detectable in upper and lower  respiratory specimens during the acute phase of infection. The lowest  concentration of SARS-CoV-2 viral copies this assay can detect is 250  copies / mL. A negative result does not preclude SARS-CoV-2 infection  and should not be used as the sole basis for treatment or other  patient management decisions.  A negative result may occur with  improper specimen collection / handling, submission of specimen other  than nasopharyngeal swab, presence of viral mutation(s) within the  areas targeted by this assay, and inadequate number of viral copies  (<250 copies / mL). A negative result must be combined with clinical  observations, patient history, and epidemiological information. If result is POSITIVE SARS-CoV-2 target nucleic acids are DETECTED. The SARS-CoV-2 RNA is generally detectable in upper and lower  respiratory specimens dur ing the acute phase of infection.  Positive  results are indicative of active infection with SARS-CoV-2.  Clinical  correlation with patient history and other diagnostic information is  necessary to determine patient infection status.  Positive results do  not rule out bacterial infection or co-infection with other viruses. If result is PRESUMPTIVE  POSTIVE SARS-CoV-2 nucleic acids MAY BE PRESENT.   A presumptive positive result was obtained on the submitted specimen  and confirmed on repeat testing.  While 2019 novel coronavirus  (SARS-CoV-2) nucleic acids may be present in the submitted sample  additional confirmatory testing may be necessary for epidemiological  and / or clinical management purposes  to differentiate between  SARS-CoV-2 and other Sarbecovirus currently known to infect humans.  If clinically indicated additional testing with an alternate test  methodology [phone]) is advised. The SARS-CoV-2 RNA is generally  detectable in upper and lower respiratory sp ecimens during the acute  phase of infection. The  expected result is Negative. Fact Sheet for Patients:  StrictlyIdeas.no Fact Sheet for Healthcare Providers: BankingDealers.co.za This test is not yet approved or cleared by the [place] FDA and has been authorized for detection and/or diagnosis of SARS-CoV-2 by FDA under an Emergency Use Authorization (EUA).  This EUA will remain in effect (meaning this test can be used) for the duration of the COVID-19 declaration under Section 564(b)(1) of the Act, 21 U.S.C. section 360bbb-3(b)(1), unless the authorization is terminated or revoked sooner. Performed at [facility], [place] [address] [place], [place] [address]      Labs: BNP (last 3 results) Recent Labs    06/15/19 1215  BNP 244.0*   Basic Metabolic Panel: Recent Labs  Lab 06/15/19 1215 06/17/19 0605  NA 139 140  K 4.2 4.1  CL 107 105  CO2 22 28  GLUCOSE 118* 103*  BUN 24* 19  CREATININE 1.10 1.19  CALCIUM 9.7 9.4   Liver Function Tests: No results for input(s): AST, ALT, ALKPHOS, BILITOT, PROT, ALBUMIN in the last 168 hours. No results for input(s): LIPASE, AMYLASE in the last 168 hours. No results for input(s): AMMONIA in the last 168 hours. CBC: Recent Labs  Lab 06/15/19 1215 06/16/19 0328 06/17/19 0605  WBC 9.8 8.5 7.6  HGB 13.2 12.3* 12.2*  HCT 39.3 36.4* 36.6*  MCV 94.2 94.5 95.6  PLT 170 151 142*   Cardiac Enzymes: No results for input(s): CKTOTAL, CKMB, CKMBINDEX, TROPONINI in the last 168 hours. BNP: Invalid input(s): POCBNP CBG: No results for input(s): GLUCAP in the last 168 hours. D-Dimer No results for input(s): DDIMER in the last 72 hours. Hgb A1c No results for input(s): HGBA1C in the last 72 hours. Lipid Profile No results for input(s): CHOL, HDL, LDLCALC, TRIG, CHOLHDL, LDLDIRECT in the last 72 hours. Thyroid function studies No results for input(s): TSH, T4TOTAL, T3FREE, THYROIDAB in the last 72 hours.  Invalid input(s):  FREET3 Anemia work up No results for input(s): VITAMINB12, FOLATE, FERRITIN, TIBC, IRON, RETICCTPCT in the last 72 hours. Urinalysis    Component Value Date/Time   COLORURINE AMBER (A) 12/03/2015 1134   APPEARANCEUR CLEAR 12/03/2015 1134   LABSPEC 1.023 12/03/2015 1134   PHURINE 7.0 12/03/2015 1134   GLUCOSEU NEGATIVE 12/03/2015 1134   GLUCOSEU NEGATIVE 09/26/2009 0752   HGBUR NEGATIVE 12/03/2015 1134   BILIRUBINUR Negative 07/15/2018 1001   KETONESUR NEGATIVE 12/03/2015 1134   PROTEINUR Negative 07/15/2018 1001   PROTEINUR 30 (A) 12/03/2015 1134   UROBILINOGEN 0.2 07/15/2018 1001   UROBILINOGEN 1.0 12/02/2014 0138   NITRITE Negative 07/15/2018 1001   NITRITE NEGATIVE 12/03/2015 1134   LEUKOCYTESUR Negative 07/15/2018 1001   Sepsis Labs Invalid input(s): PROCALCITONIN,  WBC,  LACTICIDVEN Microbiology Recent Results (from the past 240 hour(s))  SARS Coronavirus 2 [facility] order, Performed in [facility] hospital lab) Nasopharyngeal Nasopharyngeal Swab  Status: None   Collection Time: 06/15/19  1:47 PM   Specimen: Nasopharyngeal Swab  Result Value Ref Range Status   SARS Coronavirus 2 NEGATIVE NEGATIVE Final    Comment: (NOTE) If result is NEGATIVE SARS-CoV-2 target nucleic acids are NOT DETECTED. The SARS-CoV-2 RNA is generally detectable in upper and lower  respiratory specimens during the acute phase of infection. The lowest  concentration of SARS-CoV-2 viral copies this assay can detect is 250  copies / mL. A negative result does not preclude SARS-CoV-2 infection  and should not be used as the sole basis for treatment or other  patient management decisions.  A negative result may occur with  improper specimen collection / handling, submission of specimen other  than nasopharyngeal swab, presence of viral mutation(s) within the  areas targeted by this assay, and inadequate number of viral copies  (<250 copies / mL). A negative result must be combined with clinical   observations, patient history, and epidemiological information. If result is POSITIVE SARS-CoV-2 target nucleic acids are DETECTED. The SARS-CoV-2 RNA is generally detectable in upper and lower  respiratory specimens dur ing the acute phase of infection.  Positive  results are indicative of active infection with SARS-CoV-2.  Clinical  correlation with patient history and other diagnostic information is  necessary to determine patient infection status.  Positive results do  not rule out bacterial infection or co-infection with other viruses. If result is PRESUMPTIVE POSTIVE SARS-CoV-2 nucleic acids MAY BE PRESENT.   A presumptive positive result was obtained on the submitted specimen  and confirmed on repeat testing.  While 2019 novel coronavirus  (SARS-CoV-2) nucleic acids may be present in the submitted sample  additional confirmatory testing may be necessary for epidemiological  and / or clinical management purposes  to differentiate between  SARS-CoV-2 and other Sarbecovirus currently known to infect humans.  If clinically indicated additional testing with an alternate test  methodology [phone]) is advised. The SARS-CoV-2 RNA is generally  detectable in upper and lower respiratory sp ecimens during the acute  phase of infection. The expected result is Negative. Fact Sheet for Patients:  StrictlyIdeas.no Fact Sheet for Healthcare Providers: BankingDealers.co.za This test is not yet approved or cleared by the [place] FDA and has been authorized for detection and/or diagnosis of SARS-CoV-2 by FDA under an Emergency Use Authorization (EUA).  This EUA will remain in effect (meaning this test can be used) for the duration of the COVID-19 declaration under Section 564(b)(1) of the Act, 21 U.S.C. section 360bbb-3(b)(1), unless the authorization is terminated or revoked sooner. Performed at [facility], [place] [address]  [place],  [address]      Time coordinating discharge: 45 minutes  SIGNED:   Tawni Millers, MD  [facility] 06/18/2019, 8:25 AM

## 2019-06-22 ENCOUNTER — Telehealth: Payer: Self-pay | Admitting: Family Medicine

## 2019-06-22 NOTE — Telephone Encounter (Signed)
Copied from [place] [id number]. Topic: Quick Communication - See Telephone Encounter >> Jun 22, 2019  8:45 AM [place] wrote: CRM for notification. See Telephone encounter for: 06/22/19. Pt is need to drop off a form this a.m. to get his clotting medicine at a discount  and lessen $600.00 monthly charge. Was hospitalized last week with clots in his lungs. Pls get to Dr Sarajane Jews asap

## 2019-06-22 NOTE — Telephone Encounter (Signed)
Form has been placed in Dr. Barbie Banner red folder.

## 2019-06-22 NOTE — Telephone Encounter (Signed)
Pt came in and dropped off a [employer] Patient Assistance Program Application for Xarelto 20 mg to be filled out by the provider.  Upon completion pt would like to be called [phone] so that he can pick the form up.  Form placed in providers folder for completion.

## 2019-06-23 ENCOUNTER — Encounter: Payer: Self-pay | Admitting: Family Medicine

## 2019-06-23 NOTE — Telephone Encounter (Signed)
Pt picked up form from [place] and there was "No Charge"

## 2019-06-23 NOTE — Telephone Encounter (Signed)
Patient has been informed through my chart.

## 2019-06-23 NOTE — Telephone Encounter (Signed)
The form is ready  

## 2019-06-25 ENCOUNTER — Encounter: Payer: Self-pay | Admitting: Family Medicine

## 2019-06-25 ENCOUNTER — Ambulatory Visit (INDEPENDENT_AMBULATORY_CARE_PROVIDER_SITE_OTHER): Payer: Medicare Other | Admitting: Family Medicine

## 2019-06-25 VITALS — BP 130/62 | HR 79 | Temp 98.0°F | Wt 226.6 lb

## 2019-06-25 DIAGNOSIS — I2699 Other pulmonary embolism without acute cor pulmonale: Secondary | ICD-10-CM | POA: Diagnosis not present

## 2019-06-25 MED ORDER — RIVAROXABAN 20 MG PO TABS: 20.0000 mg | ORAL_TABLET | Freq: Every day | ORAL | 3 refills | Status: DC

## 2019-06-25 NOTE — Progress Notes (Signed)
   Subjective:    Patient ID: Ricky Meyers, male    DOB: 11/19/1945, 73 y.o.   MRN: [id number]  HPI Here to follow up a hospital stay from 06-15-19 to 06-18-19 for extreme SOB. This had been building up for several weeks. No chest pain or nausea. No hx of swelling or pain in the legs. No recent hx of trauma or prolonged immobility. He was found to have a lef leg DVT and multiple bilateral PE's. Otherwise his labs and ECHO were unremarkable. He was started on Xarelto at 15 mg bid. Since going home he feels back to normal with no SOB.    Review of Systems  Constitutional: Negative.   Respiratory: Negative.   Cardiovascular: Negative.   Gastrointestinal: Negative.   Genitourinary: Negative.   Neurological: Negative.        Objective:   Physical Exam Constitutional:      Appearance: Normal appearance. He is not ill-appearing.  Cardiovascular:     Rate and Rhythm: Normal rate and regular rhythm.     Pulses: Normal pulses.     Heart sounds: Normal heart sounds.  Pulmonary:     Effort: Pulmonary effort is normal.     Breath sounds: Normal breath sounds.  Musculoskeletal:        General: No tenderness.     Right lower leg: No edema.     Left lower leg: No edema.  Lymphadenopathy:     Cervical: No cervical adenopathy.  Neurological:     General: No focal deficit present.     Mental Status: He is alert and oriented to person, place, and time.           Assessment & Plan:  Recent PE's and a leg DVT. He will stay on anticoagulation for life. Transition to Xarelto 20 mg daily. We plan to repeat a chest CTA and leg dopplers in 6 months.  Alysia Penna, MD

## 2019-06-29 ENCOUNTER — Encounter: Payer: Self-pay | Admitting: Cardiology

## 2019-07-17 ENCOUNTER — Other Ambulatory Visit: Payer: Self-pay | Admitting: Family Medicine

## 2019-07-19 ENCOUNTER — Encounter: Payer: Self-pay | Admitting: Family Medicine

## 2019-07-20 ENCOUNTER — Other Ambulatory Visit: Payer: Self-pay

## 2019-07-20 ENCOUNTER — Encounter: Payer: Self-pay | Admitting: Family Medicine

## 2019-07-20 ENCOUNTER — Telehealth (INDEPENDENT_AMBULATORY_CARE_PROVIDER_SITE_OTHER): Payer: Medicare Other | Admitting: Family Medicine

## 2019-07-20 DIAGNOSIS — F1190 Opioid use, unspecified, uncomplicated: Secondary | ICD-10-CM

## 2019-07-20 DIAGNOSIS — M159 Polyosteoarthritis, unspecified: Secondary | ICD-10-CM | POA: Diagnosis not present

## 2019-07-20 MED ORDER — OXYCODONE-ACETAMINOPHEN 10-325 MG PO TABS: 1.0000 | ORAL_TABLET | Freq: Four times a day (QID) | ORAL | 0 refills | Status: DC | PRN

## 2019-07-20 NOTE — Patient Instructions (Signed)
Health Maintenance Due  Topic Date Due  . Hepatitis C Screening  1946-01-18  . INFLUENZA VACCINE  06/05/2019    Depression screen [facility] 2/9 07/15/2018 03/07/2016 01/23/2015  Decreased Interest 0 0 0  Down, Depressed, Hopeless 0 0 0  PHQ - 2 Score 0 0 0

## 2019-07-20 NOTE — Progress Notes (Signed)
Virtual Visit via Video Note  I connected with the patient on 07/20/19 at  4:00 PM EDT by a video enabled telemedicine application and verified that I am speaking with the correct person using two identifiers.  Location patient: home Location provider:work or home office Persons participating in the virtual visit: patient, provider  I discussed the limitations of evaluation and management by telemedicine and the availability of in person appointments. The patient expressed understanding and agreed to proceed.   HPI: Here for pain management, he is doing well.  Indication for chronic opioid: osteoarthritis Medication and dose: Percocet 10-325 # pills per month: 120 Last UDS date: 10-23-18 Opioid Treatment Agreement signed (Y/N): 01-15-19 Opioid Treatment Agreement last reviewed with patient:  07-20-19 [facility] reviewed this encounter (include red flags): Yes    ROS: See pertinent positives and negatives per HPI.  Past Medical History:  Diagnosis Date  . Arthritis    neck and back   . Chronic neck pain   . Depression   . ED (erectile dysfunction)   . GERD (gastroesophageal reflux disease)    dysphagia  . Gout    sees Dr. Leigh Aurora   . Hyperlipidemia   . Hypertension   . OSA (obstructive sleep apnea) 12/18/2017   Severe with AHI at 70.1/hr and is now on CPAP at [place]  . Prostate cancer [facility])    history of  . Pulmonary embolism ([place]) 06/2019  . Sleep apnea    snoring/never checked    Past Surgical History:  Procedure Laterality Date  . AMPUTATION Left 07/18/2018   Procedure: LEFT RING FINGER REVISION AMPUTATION;  Surgeon: Leanora Cover, MD;  Location: [place];  Service: Orthopedics;  Laterality: Left;  . CERVICAL FUSION  12/02/07   per Dr. Sherley Bounds  . COLONOSCOPY  03/11/2017   per Dr. Ardis Hughs, clear, no repeats needed   . injection lower back Right 09/17/2016  . PROSTATECTOMY    . ROTATOR CUFF REPAIR Right 11/2017  . TONSILLECTOMY    . VASECTOMY      Family  History  Problem Relation Age of Onset  . Cancer Other        breast, porstate  . Hypertension Other   . Stroke Other   . Breast cancer Mother   . Diverticulitis Mother   . Heart disease Father   . Prostate cancer Father   . Stroke Father   . Dementia Sister   . Colon cancer Neg Hx      Current Outpatient Medications:  .  allopurinol (ZYLOPRIM) 300 MG tablet, TAKE 1 TABLET BY MOUTH EVERY DAY (Patient taking differently: Take 300 mg by mouth daily. ), Disp: 30 tablet, Rfl: 3 .  amLODipine (NORVASC) 5 MG tablet, TAKE 1 TABLET BY MOUTH EVERY DAY (Patient taking differently: Take 5 mg by mouth daily. ), Disp: 90 tablet, Rfl: 3 .  atorvastatin (LIPITOR) 40 MG tablet, TAKE 1 TABLET BY MOUTH EVERY DAY (Patient taking differently: Take 40 mg by mouth daily at 6 PM. ), Disp: 90 tablet, Rfl: 3 .  cyclobenzaprine (FLEXERIL) 10 MG tablet, TAKE 1 TABLET BY MOUTH 3 TIMES DAILY AS NEEDED FOR MUSCLE SPASMS (Patient taking differently: Take 10 mg by mouth 3 (three) times daily as needed for muscle spasms. ), Disp: 90 tablet, Rfl: 5 .  gabapentin (NEURONTIN) 100 MG capsule, TAKE 1 CAPSULE BY MOUTH 2 TIMES DAILY (Patient taking differently: Take 100 mg by mouth 2 (two) times daily. ), Disp: 180 capsule, Rfl: 0 .  lisinopril-hydrochlorothiazide (  ZESTORETIC) 20-25 MG tablet, TAKE 1 TABLET BY MOUTH EVERY DAY (Patient taking differently: Take 1 tablet by mouth daily. ), Disp: 90 tablet, Rfl: 3 .  naproxen (NAPROSYN) 500 MG tablet, TAKE 1 TABLET BY MOUTH TWICE A DAY WITH A MEAL (Patient taking differently: Take 500 mg by mouth 2 (two) times daily with a meal. ), Disp: 60 tablet, Rfl: 0 .  PARoxetine (PAXIL) 20 MG tablet, TAKE 1 TABLET BY MOUTH EVERY MORNING (Patient taking differently: Take 20 mg by mouth daily. ), Disp: 90 tablet, Rfl: 0 .  psyllium (METAMUCIL) 58.6 % packet, Take 1 packet by mouth daily as needed (for constipation). , Disp: , Rfl:  .  rivaroxaban (XARELTO) 20 MG TABS tablet, Take 1 tablet (20 mg  total) by mouth daily with supper., Disp: 90 tablet, Rfl: 3 .  [START ON 09/19/2019] oxyCODONE-acetaminophen (PERCOCET) 10-325 MG tablet, Take 1 tablet by mouth every 6 (six) hours as needed for pain., Disp: 120 tablet, Rfl: 0  EXAM:  VITALS per patient if applicable:  GENERAL: alert, oriented, appears well and in no acute distress  HEENT: atraumatic, conjunttiva clear, no obvious abnormalities on inspection of external nose and ears  NECK: normal movements of the head and neck  LUNGS: on inspection no signs of respiratory distress, breathing rate appears normal, no obvious gross SOB, gasping or wheezing  CV: no obvious cyanosis  MS: moves all visible extremities without noticeable abnormality  PSYCH/NEURO: pleasant and cooperative, no obvious depression or anxiety, speech and thought processing grossly intact  ASSESSMENT AND PLAN: Pain management, meds were refilled. Alysia Penna, MD  Discussed the following assessment and plan:  No diagnosis found.     I discussed the assessment and treatment plan with the patient. The patient was provided an opportunity to ask questions and all were answered. The patient agreed with the plan and demonstrated an understanding of the instructions.   The patient was advised to call back or seek an in-person evaluation if the symptoms worsen or if the condition fails to improve as anticipated.

## 2019-07-20 NOTE — Telephone Encounter (Signed)
Pt has been scheduled.  °

## 2019-07-28 DIAGNOSIS — Z23 Encounter for immunization: Secondary | ICD-10-CM | POA: Diagnosis not present

## 2019-08-03 ENCOUNTER — Other Ambulatory Visit: Payer: Self-pay | Admitting: Family Medicine

## 2019-08-04 ENCOUNTER — Ambulatory Visit: Payer: Medicare Other | Admitting: Cardiology

## 2019-08-04 ENCOUNTER — Encounter

## 2019-08-04 NOTE — Telephone Encounter (Signed)
Ok for refill? 

## 2019-08-06 ENCOUNTER — Telehealth: Payer: Self-pay | Admitting: *Deleted

## 2019-08-06 ENCOUNTER — Encounter: Payer: Self-pay | Admitting: Cardiology

## 2019-08-06 ENCOUNTER — Encounter: Payer: Self-pay | Admitting: *Deleted

## 2019-08-06 ENCOUNTER — Other Ambulatory Visit: Payer: Self-pay

## 2019-08-06 ENCOUNTER — Telehealth: Payer: Self-pay

## 2019-08-06 ENCOUNTER — Telehealth (INDEPENDENT_AMBULATORY_CARE_PROVIDER_SITE_OTHER): Payer: Medicare Other | Admitting: Cardiology

## 2019-08-06 VITALS — Ht 72.0 in | Wt 225.0 lb

## 2019-08-06 DIAGNOSIS — E669 Obesity, unspecified: Secondary | ICD-10-CM

## 2019-08-06 DIAGNOSIS — G4733 Obstructive sleep apnea (adult) (pediatric): Secondary | ICD-10-CM | POA: Diagnosis not present

## 2019-08-06 DIAGNOSIS — I10 Essential (primary) hypertension: Secondary | ICD-10-CM | POA: Diagnosis not present

## 2019-08-06 NOTE — Telephone Encounter (Signed)
Order placed to [facility] via community message. 

## 2019-08-06 NOTE — Telephone Encounter (Signed)
-----   Message from Nuala Alpha, LPN sent at [phone]  8:26 AM EDT ----- Dr. Radford Pax did virtual on this pt this morning and advised to send this to you to arrange. Please call and arrange.  Thanks, Karlene Einstein  ----- Message ----- From: Sueanne Margarita, MD Sent: 08/06/2019   8:10 AM EDT To: Nuala Alpha, LPN  Resmed Airfit [id number] with chin strap and get a download in 4 weeks.  Followup with me in 1 year

## 2019-08-06 NOTE — Telephone Encounter (Signed)

## 2019-08-06 NOTE — Progress Notes (Signed)
Virtual Visit via Telephone Note   This visit type was conducted due to national recommendations for restrictions regarding the COVID-19 Pandemic (e.g. social distancing) in an effort to limit this patient's exposure and mitigate transmission in our community.  Due to his co-morbid illnesses, this patient is at least at moderate risk for complications without adequate follow up.  This format is felt to be most appropriate for this patient at this time.  The patient did not have access to video technology/had technical difficulties with video requiring transitioning to audio format only (telephone).  All issues noted in this document were discussed and addressed.  No physical exam could be performed with this format.  Please refer to the patient's chart for his  consent to telehealth for [facility].  Evaluation Performed:  Follow-up visit  This visit type was conducted due to national recommendations for restrictions regarding the COVID-19 Pandemic (e.g. social distancing).  This format is felt to be most appropriate for this patient at this time.  All issues noted in this document were discussed and addressed.  No physical exam was performed (except for noted visual exam findings with Video Visits).  Please refer to the patient's chart (MyChart message for video visits and phone note for telephone visits) for the patient's consent to telehealth for [facility].  Date:  08/06/2019   ID:  Ricky, Meyers [birth year], MRN [id number]  Patient Location:  Home  Provider location:   [place]  PCP:  [name], MD  Cardiologist:  Larae Grooms, MD  Sleep Medicine:  Fransico Him, MD Electrophysiologist:  None   Chief Complaint:  OSA  History of Present Illness:    Ricky Meyers is a 73 y.o. male who presents via audio/video conferencing for a telehealth visit today.    Ricky Meyers is a 73 y.o. male with a hx of GERD, Hyperlipidemia and HTN who was referred for sleep  study by Ermalinda Barrios, PA due to poorly controlled HTN, excessive daytimes sleepiness and nonrestorative sleep as well as snoring.  He underwent PSG showing seere OSA with an AHI of 70.1/hr and underwent CPAP titration to 11cm H2O.    He is doing well with his CPAP device and thinks that he has gotten used to it.  He is having problems with his mask leaking a lot and keeps him up at night.  He would like to try a nasal pillow mask but is a mouth breather.  He feels the pressure is adequate.  Since going on CPAP he feels rested in the am and has no significant daytime sleepiness.  He has some mild mouth but no everyday.   He does not think that he snores.    The patient does not have symptoms concerning for COVID-19 infection (fever, chills, cough, or new shortness of breath).   Prior CV studies:   The following studies were reviewed today:  PAP compliance donwload  Past Medical History:  Diagnosis Date  . Arthritis    neck and back   . Chronic neck pain   . Depression   . ED (erectile dysfunction)   . GERD (gastroesophageal reflux disease)    dysphagia  . Gout    sees Dr. Leigh Aurora   . Hyperlipidemia   . Hypertension   . OSA (obstructive sleep apnea) 12/18/2017   Severe with AHI at 70.1/hr and is now on CPAP at [place]  . Prostate cancer [facility])    history of  . Pulmonary  embolism ([place]) 06/2019  . Sleep apnea    snoring/never checked   Past Surgical History:  Procedure Laterality Date  . AMPUTATION Left 07/18/2018   Procedure: LEFT RING FINGER REVISION AMPUTATION;  Surgeon: Leanora Cover, MD;  Location: [place];  Service: Orthopedics;  Laterality: Left;  . CERVICAL FUSION  12/02/07   per Dr. Sherley Bounds  . COLONOSCOPY  03/11/2017   per Dr. Ardis Hughs, clear, no repeats needed   . injection lower back Right 09/17/2016  . PROSTATECTOMY    . ROTATOR CUFF REPAIR Right 11/2017  . TONSILLECTOMY    . VASECTOMY       Current Meds  Medication Sig  . allopurinol (ZYLOPRIM) 300 MG  tablet TAKE 1 TABLET BY MOUTH EVERY DAY (Patient taking differently: Take 300 mg by mouth daily. )  . amLODipine (NORVASC) 5 MG tablet TAKE 1 TABLET BY MOUTH EVERY DAY (Patient taking differently: Take 5 mg by mouth daily. )  . atorvastatin (LIPITOR) 40 MG tablet TAKE 1 TABLET BY MOUTH EVERY DAY (Patient taking differently: Take 40 mg by mouth daily at 6 PM. )  . cyclobenzaprine (FLEXERIL) 10 MG tablet TAKE 1 TABLET BY MOUTH 3 TIMES DAILY AS NEEDED FOR MUSCLE SPASMS (Patient taking differently: Take 10 mg by mouth 3 (three) times daily as needed for muscle spasms. )  . gabapentin (NEURONTIN) 100 MG capsule TAKE 1 CAPSULE BY MOUTH 2 TIMES DAILY (Patient taking differently: Take 100 mg by mouth 2 (two) times daily. )  . lisinopril-hydrochlorothiazide (ZESTORETIC) 20-25 MG tablet TAKE 1 TABLET BY MOUTH EVERY DAY (Patient taking differently: Take 1 tablet by mouth daily. )  . naproxen (NAPROSYN) 500 MG tablet TAKE 1 TABLET BY MOUTH TWICE A DAY WITH A MEAL  . [START ON 09/19/2019] oxyCODONE-acetaminophen (PERCOCET) 10-325 MG tablet Take 1 tablet by mouth every 6 (six) hours as needed for pain.  Marland Kitchen PARoxetine (PAXIL) 20 MG tablet TAKE 1 TABLET BY MOUTH EVERY MORNING (Patient taking differently: Take 20 mg by mouth daily. )  . psyllium (METAMUCIL) 58.6 % packet Take 1 packet by mouth daily as needed (for constipation).   . rivaroxaban (XARELTO) 20 MG TABS tablet Take 1 tablet (20 mg total) by mouth daily with supper.     Allergies:   Patient has no known allergies.   Social History   Tobacco Use  . Smoking status: Never Smoker  . Smokeless tobacco: Never Used  Substance Use Topics  . Alcohol use: Yes    Alcohol/week: 0.0 standard drinks    Comment: occ  . Drug use: No     Family Hx: [name] family history includes Breast cancer in his mother; Cancer in an other family member; Dementia in his sister; Diverticulitis in his mother; Heart disease in his father; Hypertension in an other family  member; Prostate cancer in his father; Stroke in his father and another family member. There is no history of Colon cancer.  ROS:   Please see the history of present illness.     All other systems reviewed and are negative.   Labs/Other Tests and Data Reviewed:    Recent Labs: 06/15/2019: B Natriuretic Peptide 110.5 06/17/2019: BUN 19; Creatinine, Ser 1.19; Hemoglobin 12.2; Platelets 142; Potassium 4.1; Sodium 140   Recent Lipid Panel Lab Results  Component Value Date/Time   CHOL 144 07/15/2018 09:41 AM   TRIG 74.0 07/15/2018 09:41 AM   HDL 59.30 07/15/2018 09:41 AM   CHOLHDL 2 07/15/2018 09:41 AM   LDLCALC 70 07/15/2018 09:41  AM   LDLDIRECT 161.4 09/29/2012 08:11 AM    Wt Readings from Last 3 Encounters:  08/06/19 225 lb (102.1 kg)  06/25/19 226 lb 9.6 oz (102.8 kg)  06/17/19 225 lb 5 oz (102.2 kg)     Objective:    Vital Signs:  Ht 6' (1.829 m)   Wt 225 lb (102.1 kg)   BMI 30.52 kg/m     ASSESSMENT & PLAN:    1.  OSA -  The patient is tolerating PAP therapy wel.  He is having problems with his mask leaking a lot.  He would like to try a nasal pillow mask and will need a chin strap. The patient has been using and benefiting from PAP use and will continue to benefit from therapy. I will get a download from his DME.  I will order nasal pillow mask with chin strap and get download 4 weeks.  2.  HTN -continue amlodipine 5mg  daily, Lisinopril HCT 20-25mg  daily.  3.  Obesity  -I have encouraged him to get into a routine exercise program and cut back on carbs and portions. His exercise is limited by spinal stenosis but is able to use a stationary bike.  COVID-19 Education: The signs and symptoms of COVID-19 were discussed with the patient and how to seek care for testing (follow up with PCP or arrange E-visit).  The importance of social distancing was discussed today.  Patient Risk:   After full review of this patient's clinical status, I feel that they are at least  moderate risk at this time.  Time:   Today, I have spent 20 minutes directly with the patient on telemedicine discussing medical problems including OSA, HTN, obesity.  We also reviewed the symptoms of COVID 19 and the ways to protect against contracting the virus with telehealth technology.  I spent an additional 5 minutes reviewing patient's chart including PAP compliance download.  Medication Adjustments/Labs and Tests Ordered: Current medicines are reviewed at length with the patient today.  Concerns regarding medicines are outlined above.  Tests Ordered: No orders of the defined types were placed in this encounter.  Medication Changes: No orders of the defined types were placed in this encounter.   Disposition:  Follow up 1 year  Signed, Fransico Him, MD  08/06/2019 8:11 AM    [place]

## 2019-08-06 NOTE — Patient Instructions (Signed)
Medication Instructions:   Your physician recommends that you continue on your current medications as directed. Please refer to the Current Medication list given to you today.  If you need a refill on your cardiac medications before your next appointment, please call your pharmacy.     Follow-Up: At [facility], you and your health needs are our priority.  As part of our continuing mission to provide you with exceptional heart care, we have created designated Provider Care Teams.  These Care Teams include your primary Cardiologist (physician) and Advanced Practice Providers (APPs -  Physician Assistants and Nurse Practitioners) who all work together to provide you with the care you need, when you need it.  Your physician wants you to follow-up in: [place] will receive a reminder letter in the mail two months in advance. If you don't receive a letter, please call our office to schedule the follow-up appointment.  NINA JONES OUR SLEEP STUDY COORDINATOR WILL BE IN CONTACT WITH YOU TO TALK ABOUT AND ARRANGE FOR YOUR RESMED AIRFIT 30 WITH CHIN STRAP AND TO GET A DOWNLOAD FROM YOUR DEVICE IN 4 WEEKS.  BE ON THE LISTEN OUT FOR HER CALL

## 2019-08-19 ENCOUNTER — Telehealth: Payer: Self-pay | Admitting: Family Medicine

## 2019-08-19 NOTE — Telephone Encounter (Signed)
Caller: Juliann Pulse From:[facility] - [place], [place] - [address]   oxyCODONE-acetaminophen (PERCOCET) 10-325 MG tablet   Patient is wanting to save money and would like to switch to Oxycodone 10 mg  without Tylenol    Please advise  [facility] - [place], [place] - [address]  [address] [place] [address]  Phone: [phone] Fax: [phone]

## 2019-08-23 NOTE — Telephone Encounter (Signed)
Spoke to pt and he stated that his Rx price went from 42.95 and now it has gone up to $95. Pt stated that the pharmacist advised that he could get a Rx of Oxycodone w/o the Tylenol and take Tylenol OTC to make the Rx inexpensive. Pt notified that Dr. Sarajane Jews was out of the office but will see if another provider could assist with the change if appropriate. Pt verbalized understanding.    Dr.Burchette can you please advise in the absents of Dr.Fry?

## 2019-08-23 NOTE — Telephone Encounter (Signed)
I would prefer that he discuss any changes in opioid with Dr Sarajane Jews.  This can wait until next week.

## 2019-08-23 NOTE — Telephone Encounter (Signed)
Left message to return phone call.

## 2019-08-24 NOTE — Telephone Encounter (Signed)
Left message to return phone call.

## 2019-08-24 NOTE — Telephone Encounter (Signed)
Noted  

## 2019-08-31 NOTE — Progress Notes (Signed)
Cardiology Office Note   Date:  09/01/2019   ID:  Ricky Meyers, Ricky Meyers May 01, 1946, MRN [id number]  PCP:  [name], MD    No chief complaint on file.  PVC  Wt Readings from Last 3 Encounters:  09/01/19 234 lb 6.4 oz (106.3 kg)  08/06/19 225 lb (102.1 kg)  06/25/19 226 lb 9.6 oz (102.8 kg)       History of Present Illness: Ricky Meyers is a 73 y.o. male  Who has had PVCs in the past.  He had some DOE  And was referred for nuclear stress test and echocardiogram.  No ischemia noted but he did have slightly decreased EF.    2018 echo showed: Technically difficult study. Definity contrast given. LVEF 45-50% with anterior and anteroseptal hypokinesis to dyskinesis, mild LVH, grade 1 DD, normal LV filling pressure, trivial MR, normal LA size, normal IVC.  He has chronic back issues which haave limited his activity.  He has had issues with lack of exercise in the past.   He was seen by Dr. Radford Pax for OSA. Had DOE in the past thought to be due to deconditioning.  He was diagnosed with DVT/PE in 06/2019.  He has been on XArelto.  Breathing has been better.  MVP: eccentric MR noted by echo in 06/2019.    06/2019 echo: "The left ventricle has normal systolic function, with an ejection fraction of 55-60%. The cavity size was normal. There is mildly increased left ventricular wall thickness. Left ventricular diastolic Doppler parameters are consistent with impaired  relaxation.  2. The right ventricle has moderately reduced systolic function. The cavity was mildly enlarged.  3. The aortic valve has an indeterminate number of cusps. No stenosis of the aortic valve.  4. The aorta is normal in size and structure.  5. The inferior vena cava was dilated in size with >50% respiratory variability.  6. There is left bowing of the interatrial septum, suggestive of elevated right atrial pressure.  7. Technically difficult; normal LV systolic function; mild diastolic dysfunction;  mild LVH; mitral valve not well visualize but there is eccentric MR directed anteriorly suggesting prolapse of posterior MV leaflet; mild RVE; moderate RV dysfunction."  He is increasing walking.  He played golf for the first time yesterday since the DVT.  Denies : Chest pain. Dizziness. Leg edema. Nitroglycerin use. Orthopnea. Palpitations. Paroxysmal nocturnal dyspnea. Syncope.   DOE improving.   Past Medical History:  Diagnosis Date  . Arthritis    neck and back   . Chronic neck pain   . Depression   . ED (erectile dysfunction)   . GERD (gastroesophageal reflux disease)    dysphagia  . Gout    sees Dr. Leigh Aurora   . Hyperlipidemia   . Hypertension   . OSA (obstructive sleep apnea) 12/18/2017   Severe with AHI at 70.1/hr and is now on CPAP at [place]  . Prostate cancer [facility])    history of  . Pulmonary embolism ([place]) 06/2019  . Sleep apnea    snoring/never checked    Past Surgical History:  Procedure Laterality Date  . AMPUTATION Left 07/18/2018   Procedure: LEFT RING FINGER REVISION AMPUTATION;  Surgeon: Leanora Cover, MD;  Location: [place];  Service: Orthopedics;  Laterality: Left;  . CERVICAL FUSION  12/02/07   per Dr. Sherley Bounds  . COLONOSCOPY  03/11/2017   per Dr. Ardis Hughs, clear, no repeats needed   . injection lower back Right 09/17/2016  .  PROSTATECTOMY    . ROTATOR CUFF REPAIR Right 11/2017  . TONSILLECTOMY    . VASECTOMY       Current Outpatient Medications  Medication Sig Dispense Refill  . allopurinol (ZYLOPRIM) 300 MG tablet TAKE 1 TABLET BY MOUTH EVERY DAY 30 tablet 3  . amLODipine (NORVASC) 5 MG tablet TAKE 1 TABLET BY MOUTH EVERY DAY 90 tablet 3  . atorvastatin (LIPITOR) 40 MG tablet TAKE 1 TABLET BY MOUTH EVERY DAY 90 tablet 3  . cyclobenzaprine (FLEXERIL) 10 MG tablet TAKE 1 TABLET BY MOUTH 3 TIMES DAILY AS NEEDED FOR MUSCLE SPASMS 90 tablet 5  . gabapentin (NEURONTIN) 100 MG capsule TAKE 1 CAPSULE BY MOUTH 2 TIMES DAILY 180 capsule 0  .  lisinopril-hydrochlorothiazide (ZESTORETIC) 20-25 MG tablet TAKE 1 TABLET BY MOUTH EVERY DAY 90 tablet 3  . naproxen (NAPROSYN) 500 MG tablet TAKE 1 TABLET BY MOUTH TWICE A DAY WITH A MEAL 60 tablet 5  . [START ON 09/19/2019] oxyCODONE-acetaminophen (PERCOCET) 10-325 MG tablet Take 1 tablet by mouth every 6 (six) hours as needed for pain. 120 tablet 0  . PARoxetine (PAXIL) 20 MG tablet TAKE 1 TABLET BY MOUTH EVERY MORNING 90 tablet 0  . psyllium (METAMUCIL) 58.6 % packet Take 1 packet by mouth daily as needed (for constipation).     . rivaroxaban (XARELTO) 20 MG TABS tablet Take 1 tablet (20 mg total) by mouth daily with supper. 90 tablet 3   No current facility-administered medications for this visit.     Allergies:   Patient has no known allergies.    Social History:  The patient  reports that he has never smoked. He has never used smokeless tobacco. He reports current alcohol use. He reports that he does not use drugs.   Family History:  [name] family history includes Breast cancer in his mother; Cancer in an other family member; Dementia in his sister; Diverticulitis in his mother; Heart disease in his father; Hypertension in an other family member; Prostate cancer in his father; Stroke in his father and another family member.    ROS:  Please see the history of present illness.   Otherwise, review of systems are positive for .   All other systems are reviewed and negative.    PHYSICAL EXAM: VS:  BP 110/74   Pulse 98   Ht 6' (1.829 m)   Wt 234 lb 6.4 oz (106.3 kg)   SpO2 96%   BMI 31.79 kg/m  , BMI Body mass index is 31.79 kg/m. GEN: Well nourished, well developed, in no acute distress  HEENT: normal  Neck: no JVD, carotid bruits, or masses Cardiac: RRR; no murmurs, rubs, or gallops,no edema  Respiratory:  clear to auscultation bilaterally, normal work of breathing GI: soft, nontender, nondistended, + BS MS: no deformity or atrophy  Skin: warm and dry, no rash Neuro:   Strength and sensation are intact Psych: euthymic mood, full affect   EKG:   The ekg ordered today demonstrates NSR, IVCD- more like LBBB   Recent Labs: 06/15/2019: B Natriuretic Peptide 110.5 06/17/2019: BUN 19; Creatinine, Ser 1.19; Hemoglobin 12.2; Platelets 142; Potassium 4.1; Sodium 140   Lipid Panel    Component Value Date/Time   CHOL 144 07/15/2018 0941   TRIG 74.0 07/15/2018 0941   HDL 59.30 07/15/2018 0941   CHOLHDL 2 07/15/2018 0941   VLDL 14.8 07/15/2018 0941   LDLCALC 70 07/15/2018 0941   LDLDIRECT 161.4 09/29/2012 0811     Other studies Reviewed:  Additional studies/ records that were reviewed today with results demonstrating: 2020 echo reviewed. 12.2 Hbg in 06/2019   ASSESSMENT AND PLAN:  1. DOE: Improving.  COntinue XArelto in the setting of recent PE. Getting help from drug company to afford the medicine.   2. MVP: eccentric MR noted.  No CHF sx.  WIll have to monitor.   3. PVCs: No sx. 4. Hyperlipidemia: PMD to do physical in a month.  5. Obesity: Increased over the last few weeks, but he had lost weight before COVID.  6. OSA: Followed by Dr. Radford Pax.   Current medicines are reviewed at length with the patient today.  The patient concerns regarding his medicines were addressesed The following changes have been made:  No change  Labs/ tests ordered today include:  No orders of the defined types were placed in this encounter.   Recommend 150 minutes/week of aerobic exercise Low fat, low carb, high fiber diet recommended  Disposition:   FU in 1 year   Signed, Larae Grooms, MD  09/01/2019 2:09 PM    [facility] [address], [place], [place]  [address] Phone: [phone]; Fax: [phone]

## 2019-09-01 ENCOUNTER — Encounter: Payer: Self-pay | Admitting: Interventional Cardiology

## 2019-09-01 ENCOUNTER — Ambulatory Visit (INDEPENDENT_AMBULATORY_CARE_PROVIDER_SITE_OTHER): Payer: Medicare Other | Admitting: Interventional Cardiology

## 2019-09-01 ENCOUNTER — Other Ambulatory Visit: Payer: Self-pay

## 2019-09-01 ENCOUNTER — Other Ambulatory Visit: Payer: Self-pay | Admitting: Family Medicine

## 2019-09-01 VITALS — BP 110/74 | HR 98 | Ht 72.0 in | Wt 234.4 lb

## 2019-09-01 DIAGNOSIS — E669 Obesity, unspecified: Secondary | ICD-10-CM

## 2019-09-01 DIAGNOSIS — I493 Ventricular premature depolarization: Secondary | ICD-10-CM | POA: Diagnosis not present

## 2019-09-01 DIAGNOSIS — E782 Mixed hyperlipidemia: Secondary | ICD-10-CM | POA: Diagnosis not present

## 2019-09-01 DIAGNOSIS — E66811 Obesity, class 1: Secondary | ICD-10-CM

## 2019-09-01 DIAGNOSIS — I10 Essential (primary) hypertension: Secondary | ICD-10-CM | POA: Diagnosis not present

## 2019-09-01 DIAGNOSIS — G4733 Obstructive sleep apnea (adult) (pediatric): Secondary | ICD-10-CM

## 2019-09-01 NOTE — Patient Instructions (Signed)

## 2019-09-02 MED ORDER — OXYCODONE HCL 10 MG PO TABS: 10.0000 mg | ORAL_TABLET | Freq: Four times a day (QID) | ORAL | 0 refills | Status: AC | PRN

## 2019-09-02 MED ORDER — OXYCODONE HCL 10 MG PO TABS: 10.0000 mg | ORAL_TABLET | Freq: Four times a day (QID) | ORAL | 0 refills | Status: DC | PRN

## 2019-09-02 NOTE — Telephone Encounter (Signed)
Noted  

## 2019-09-02 NOTE — Telephone Encounter (Signed)
Please advise 

## 2019-09-02 NOTE — Telephone Encounter (Signed)
I sent in a 2 month supply of plain Oxycodone. Please call the pharmacy to cancel any remaining refills for Oxycodone-acetominophen

## 2019-09-02 NOTE — Telephone Encounter (Signed)
Called pharmacy and left a message to D/c oxy-ace.

## 2019-09-11 ENCOUNTER — Other Ambulatory Visit: Payer: Self-pay | Admitting: Family Medicine

## 2019-09-13 ENCOUNTER — Telehealth: Payer: Self-pay | Admitting: *Deleted

## 2019-09-13 ENCOUNTER — Ambulatory Visit: Payer: Medicare Other | Admitting: Cardiology

## 2019-09-13 NOTE — Telephone Encounter (Signed)
-----   Message from Sueanne Margarita, MD sent at 09/13/2019  4:40 PM EST ----- Good AHI on PAP but needs to improve compliance

## 2019-09-13 NOTE — Telephone Encounter (Signed)
Informed patient of compliance results and verbalized understanding was indicated. Patient is aware and agreeable to AHI being within range at 5. Patient is aware and agreeable to being in compliance with machine usage Patient is aware and agreeable to no change in current pressures

## 2019-11-09 ENCOUNTER — Encounter: Payer: Self-pay | Admitting: Family Medicine

## 2019-11-09 NOTE — Telephone Encounter (Signed)
Set him up for an in person PMV. He can give a UDS and get a TDaP all at the same visit.

## 2019-11-09 NOTE — Telephone Encounter (Signed)
Last PMP 07/20/2019 Is appointment needed?

## 2019-11-15 ENCOUNTER — Telehealth: Payer: Self-pay | Admitting: Family Medicine

## 2019-11-15 NOTE — Telephone Encounter (Signed)
Medication: oxyCODONE (Oxy IR/ROXICODONE) immediate release tablet 10 mg   [id number]- Appt scheduled 11/23/19  Has the patient contacted their pharmacy? Yes  (Agent: If no, request that the patient contact the pharmacy for the refill.) (Agent: If yes, when and what did the pharmacy advise?)  Preferred Pharmacy (with phone number or street name): [facility] - [place], [place] - [address]  Phone:  [phone] Fax:  [phone]     Agent: Please be advised that RX refills may take up to 3 business days. We ask that you follow-up with your pharmacy.

## 2019-11-15 NOTE — Telephone Encounter (Signed)
Patient has a PMV scheduled for 11/23/2019

## 2019-11-15 NOTE — Telephone Encounter (Signed)
He needs a PMV 

## 2019-11-15 NOTE — Telephone Encounter (Signed)
Last fill 10/03/2019 Last OV 07/20/2019  Ok to fill?

## 2019-11-16 ENCOUNTER — Telehealth: Payer: Self-pay | Admitting: Family Medicine

## 2019-11-16 NOTE — Telephone Encounter (Signed)
Last filled 10/03/2019 Last OV 07/20/2019  Ok to fill?

## 2019-11-17 ENCOUNTER — Other Ambulatory Visit: Payer: Self-pay | Admitting: Family Medicine

## 2019-11-17 DIAGNOSIS — H35033 Hypertensive retinopathy, bilateral: Secondary | ICD-10-CM | POA: Diagnosis not present

## 2019-11-17 DIAGNOSIS — D3131 Benign neoplasm of right choroid: Secondary | ICD-10-CM | POA: Diagnosis not present

## 2019-11-17 DIAGNOSIS — H2513 Age-related nuclear cataract, bilateral: Secondary | ICD-10-CM | POA: Diagnosis not present

## 2019-11-17 DIAGNOSIS — H25013 Cortical age-related cataract, bilateral: Secondary | ICD-10-CM | POA: Diagnosis not present

## 2019-11-17 MED ORDER — OXYCODONE HCL 10 MG PO TABS: 10.0000 mg | ORAL_TABLET | Freq: Four times a day (QID) | ORAL | 0 refills | Status: AC | PRN

## 2019-11-17 NOTE — Telephone Encounter (Signed)
Patient called to check the status of his refill.  Stated that he is about to run out of medication.  Please advise.

## 2019-11-17 NOTE — Telephone Encounter (Signed)
Patient is aware 

## 2019-11-17 NOTE — Addendum Note (Signed)
Addended by: Alysia Penna A on: 11/17/2019 01:11 PM   Modules accepted: Orders

## 2019-11-17 NOTE — Telephone Encounter (Signed)
Last filled 10/03/2019 Last OV 07/20/2019  Ok to fill?

## 2019-11-17 NOTE — Telephone Encounter (Signed)
Patient has a PMV scheduled for 11/23/2019, please advise

## 2019-11-17 NOTE — Telephone Encounter (Signed)
I sent in #20 to last until the [place]

## 2019-11-22 ENCOUNTER — Other Ambulatory Visit: Payer: Self-pay

## 2019-11-23 ENCOUNTER — Encounter: Payer: Self-pay | Admitting: Family Medicine

## 2019-11-23 ENCOUNTER — Ambulatory Visit (INDEPENDENT_AMBULATORY_CARE_PROVIDER_SITE_OTHER): Payer: Medicare Other | Admitting: Family Medicine

## 2019-11-23 VITALS — BP 120/60 | HR 81 | Temp 97.7°F | Wt 236.2 lb

## 2019-11-23 DIAGNOSIS — M159 Polyosteoarthritis, unspecified: Secondary | ICD-10-CM | POA: Diagnosis not present

## 2019-11-23 DIAGNOSIS — F1190 Opioid use, unspecified, uncomplicated: Secondary | ICD-10-CM

## 2019-11-23 MED ORDER — OXYCODONE-ACETAMINOPHEN 10-325 MG PO TABS: 1.0000 | ORAL_TABLET | Freq: Four times a day (QID) | ORAL | 0 refills | Status: DC | PRN

## 2019-11-23 NOTE — Progress Notes (Signed)
   Subjective:    Patient ID: Ricky Meyers, male    DOB: [birth year], 74 y.o.   MRN: [id number]  HPI Here for pain management, he is doing well.  Indication for chronic opioid: osteoarthritis  Medication and dose: Percocet 10-325  # pills per month: 120 Last UDS date: 11-23-19 Opioid Treatment Agreement signed (Y/N): 01-15-19 Opioid Treatment Agreement last reviewed with patient:  11-23-19 NCCSRS reviewed this encounter (include red flags):  11-23-19    Review of Systems     Objective:   Physical Exam        Assessment & Plan:  Pain management, meds were refilled.  Alysia Penna, MD

## 2019-11-25 LAB — PAIN MGMT, PROFILE 8 W/CONF, U
6 Acetylmorphine: NEGATIVE ng/mL
Alcohol Metabolites: NEGATIVE ng/mL (ref <500)
Amphetamines: NEGATIVE ng/mL
Benzodiazepines: NEGATIVE ng/mL
Buprenorphine, Urine: NEGATIVE ng/mL
Cocaine Metabolite: NEGATIVE ng/mL
Codeine: NEGATIVE ng/mL
Creatinine: 159.6 mg/dL
Hydrocodone: NEGATIVE ng/mL
Hydromorphone: NEGATIVE ng/mL
MDMA: NEGATIVE ng/mL
Marijuana Metabolite: NEGATIVE ng/mL
Morphine: NEGATIVE ng/mL
Norhydrocodone: NEGATIVE ng/mL
Noroxycodone: 3260 ng/mL
Opiates: NEGATIVE ng/mL
Oxidant: NEGATIVE ug/mL
Oxycodone: 2501 ng/mL
Oxycodone: POSITIVE ng/mL
Oxymorphone: 2800 ng/mL
pH: 5.3 (ref 4.5–9.0)

## 2019-11-26 DIAGNOSIS — Z85828 Personal history of other malignant neoplasm of skin: Secondary | ICD-10-CM | POA: Diagnosis not present

## 2019-11-26 DIAGNOSIS — D3610 Benign neoplasm of peripheral nerves and autonomic nervous system, unspecified: Secondary | ICD-10-CM | POA: Diagnosis not present

## 2019-11-26 DIAGNOSIS — L570 Actinic keratosis: Secondary | ICD-10-CM | POA: Diagnosis not present

## 2019-11-26 DIAGNOSIS — L821 Other seborrheic keratosis: Secondary | ICD-10-CM | POA: Diagnosis not present

## 2019-11-26 DIAGNOSIS — D225 Melanocytic nevi of trunk: Secondary | ICD-10-CM | POA: Diagnosis not present

## 2019-11-26 DIAGNOSIS — L905 Scar conditions and fibrosis of skin: Secondary | ICD-10-CM | POA: Diagnosis not present

## 2019-11-26 DIAGNOSIS — D1801 Hemangioma of skin and subcutaneous tissue: Secondary | ICD-10-CM | POA: Diagnosis not present

## 2019-12-13 ENCOUNTER — Telehealth: Payer: Self-pay | Admitting: Family Medicine

## 2019-12-13 NOTE — Telephone Encounter (Signed)
Refill the Xarelto but do NOT refill Naproxen. He cannot take this with Xarelto

## 2019-12-13 NOTE — Telephone Encounter (Signed)
naproxen (NAPROSYN) 500 MG tablet   rivaroxaban Alveda Reasons) 20 MG TABS tablet   [facility] [place], [place] - [address] Phone:  [phone]  Fax:  [phone]      The pharmacy call in reference to a medication interaction.  They normally don't fill these two medications at the same time.  Please Advise

## 2019-12-15 NOTE — Telephone Encounter (Signed)
Noted  

## 2019-12-15 NOTE — Telephone Encounter (Signed)
[facility] called in reference to the message on 12/13/2019.  I read the note to the pharmacist that Dr. Sarajane Jews left while supervised by Lavella Lemons.

## 2019-12-20 ENCOUNTER — Telehealth: Payer: Self-pay | Admitting: *Deleted

## 2019-12-20 NOTE — Telephone Encounter (Signed)
Pharmacy called wanting to know if patient can get Oxycodone refilled on tomorrow, before the due date on Rx 12/24/19. Please advise

## 2019-12-20 NOTE — Telephone Encounter (Signed)
That's okay with me. Please tell the pharmacy

## 2019-12-21 NOTE — Telephone Encounter (Signed)
Pharmacy has been made aware. They will reach out to the patient.

## 2019-12-31 ENCOUNTER — Telehealth: Payer: Self-pay | Admitting: Family Medicine

## 2019-12-31 NOTE — Telephone Encounter (Signed)
Pt would like to know if he is supposed to get a referral regarding his blood clots in his lungs? Pt remembers talking to Dr. Sarajane Jews, regarding this issue but is not sure if he needs to be seen in the office or if he needs a referral. Thanks

## 2019-12-31 NOTE — Telephone Encounter (Signed)
There is no need to see a specialist (hematology) since we have already decided that he needs to be on blood thinners the rest of his life

## 2020-01-10 ENCOUNTER — Other Ambulatory Visit: Payer: Self-pay | Admitting: Family Medicine

## 2020-01-19 ENCOUNTER — Telehealth: Payer: Self-pay | Admitting: Family Medicine

## 2020-01-19 NOTE — Telephone Encounter (Signed)
He needs a PMV 

## 2020-01-19 NOTE — Telephone Encounter (Signed)
Medication Refill: Oxycodone-acetaminophen 10-325 mg  Pharmacy: [facility]: [phone]   Pt called his pharmacy and told them that he would like this filled before this Friday because he is going out of town. Pharmacy called to see if PCP will be willing to fill it early?

## 2020-01-19 NOTE — Telephone Encounter (Signed)
Message Routed to PCP  for approval. 

## 2020-01-19 NOTE — Telephone Encounter (Signed)
Left detailed message for patient to return call to office to schedule appointment.  

## 2020-02-04 DIAGNOSIS — D485 Neoplasm of uncertain behavior of skin: Secondary | ICD-10-CM | POA: Diagnosis not present

## 2020-02-04 DIAGNOSIS — C44629 Squamous cell carcinoma of skin of left upper limb, including shoulder: Secondary | ICD-10-CM | POA: Diagnosis not present

## 2020-02-04 DIAGNOSIS — L821 Other seborrheic keratosis: Secondary | ICD-10-CM | POA: Diagnosis not present

## 2020-02-12 ENCOUNTER — Other Ambulatory Visit: Payer: Self-pay | Admitting: Family Medicine

## 2020-02-15 ENCOUNTER — Other Ambulatory Visit: Payer: Self-pay

## 2020-02-16 ENCOUNTER — Ambulatory Visit (INDEPENDENT_AMBULATORY_CARE_PROVIDER_SITE_OTHER): Payer: Medicare Other | Admitting: Family Medicine

## 2020-02-16 ENCOUNTER — Encounter: Payer: Self-pay | Admitting: Family Medicine

## 2020-02-16 VITALS — BP 122/60 | HR 90 | Temp 98.2°F | Wt 225.2 lb

## 2020-02-16 DIAGNOSIS — F1190 Opioid use, unspecified, uncomplicated: Secondary | ICD-10-CM | POA: Diagnosis not present

## 2020-02-16 DIAGNOSIS — M159 Polyosteoarthritis, unspecified: Secondary | ICD-10-CM

## 2020-02-16 MED ORDER — OXYCODONE-ACETAMINOPHEN 10-325 MG PO TABS: 1.0000 | ORAL_TABLET | Freq: Four times a day (QID) | ORAL | 0 refills | Status: DC | PRN

## 2020-02-16 NOTE — Progress Notes (Signed)
   Subjective:    Patient ID: Ricky Meyers, male    DOB: 01-22-1946, 74 y.o.   MRN: [id number]  HPI Here for pain management. He is doing well.  Indication for chronic opioid: osteoarthritis Medication and dose: Percocet 10-325  # pills per month: 120 Last UDS date: 11-23-19 Opioid Treatment Agreement signed (Y/N): 01-15-19 Opioid Treatment Agreement last reviewed with patient:  02-16-20 NCCSRS reviewed this encounter (include red flags): Yes    Review of Systems     Objective:   Physical Exam        Assessment & Plan:  Pain management, meds were refilled.  Alysia Penna, MD

## 2020-02-17 ENCOUNTER — Encounter: Payer: Self-pay | Admitting: Family Medicine

## 2020-02-17 DIAGNOSIS — R0609 Other forms of dyspnea: Secondary | ICD-10-CM

## 2020-02-17 NOTE — Telephone Encounter (Signed)
I did the referral 

## 2020-02-21 ENCOUNTER — Other Ambulatory Visit: Payer: Self-pay | Admitting: Family Medicine

## 2020-02-21 DIAGNOSIS — C44629 Squamous cell carcinoma of skin of left upper limb, including shoulder: Secondary | ICD-10-CM | POA: Diagnosis not present

## 2020-03-20 ENCOUNTER — Other Ambulatory Visit: Payer: Self-pay | Admitting: Family Medicine

## 2020-03-30 ENCOUNTER — Ambulatory Visit (INDEPENDENT_AMBULATORY_CARE_PROVIDER_SITE_OTHER): Payer: Medicare Other | Admitting: Internal Medicine

## 2020-03-30 ENCOUNTER — Ambulatory Visit (INDEPENDENT_AMBULATORY_CARE_PROVIDER_SITE_OTHER): Payer: Medicare Other

## 2020-03-30 ENCOUNTER — Encounter: Payer: Self-pay | Admitting: Internal Medicine

## 2020-03-30 ENCOUNTER — Other Ambulatory Visit: Payer: Self-pay

## 2020-03-30 DIAGNOSIS — I2694 Multiple subsegmental pulmonary emboli without acute cor pulmonale: Secondary | ICD-10-CM

## 2020-03-30 DIAGNOSIS — R0609 Other forms of dyspnea: Secondary | ICD-10-CM | POA: Diagnosis not present

## 2020-03-30 NOTE — Progress Notes (Signed)
Ricky Meyers, male    DOB: 1946-08-04,    MRN: [id number]   Brief patient profile:  40 yowm never smoker very active including eliptical x 40 min x 4 times weekly  Then:    Admit date: 06/15/2019 Discharge date: 06/18/2019  Admitted From: Home  Disposition:  Home    Brief/Interim Summary: 74 year old male who presented with dyspnea. He does have significant past medical history for hypertension, dyslipidemia and obesity. Reported several months of declining energy and decreased exercise tolerance. Worsening dyspnea on exertion to the point where he became dyspneic with minimal efforts. On his initial physical examination blood pressure 128/79, heart rate 91, respiratory rate 19, oxygen saturation 96%, moist mucous membranes, lungs clear to auscultation bilaterally, heart S1-S2 present and rhythmic, abdomen soft nontender, no lower extremity edema. Sodium 139, potassium 4.2, chloride 107, bicarb 22, glucose 118, BUN 24, creatinine 1.1, high sensitive troponin 259, white count 9.8, hemoglobin 13.2, hematocrit 39.3, platelets 170.  SARS COVID-19 was negative Chest x-ray negative for infiltrates. CT chest with acute bilateral lobar and segmental pulmonary emboli, involving all lobes of both lungs, positive right heart strain, with RV/LV ratio 1.9.EKG 108 bpm, right axis deviation, interventricular conduction delay, sinus rhythm, no ST segment changes, poor R wave progression, negative T waves in V5 to V6.  Patient was admitted to the hospital working diagnosis of acute submassive pulmonary embolism.   1.  Acute pulmonary embolism, submassive,/ acute left femoral and left posterior tibial veins deep vein thrombosis. Complicated with acute core pulmonale. Patient was admitted to the medical telemetry unit, he received anticoagulation with unfractionated IV heparin with good toleration.  His blood pressure remained stable, with systolic between [id number] and [id number] mmHG, his oxygenation was 95 to  97% on room air.  Further work-up with echocardiography showed a normal LV systolic function, ejection fraction 55 to 60%, the right ventricle systolic function was moderately reduced and the cavity was mildly enlarged.  It was noted left with bowing the interatrial septum, suggestive of elevated right atrial pressure.  Ultrasonography of his lower extremities showed acute deep vein thrombosis involving the left femoral vein and left posterior tibial veins.  Right lower extremity negative for deep vein thrombosis.  Patient was able to ambulate with physical therapy with no difficulties.  Patient has been transitioned to oral anticoagulation with rivaroxaban.  He will follow-up as an outpatient within 7 days.  2.  Hypertension.  Blood pressure remained well controlled, patient was continued on amlodipine, at discharge he will resume  3.  Dyslipidemia.  Continue statin therapy.  4.  Gout.  No acute flare, continue allopurinol.  5.  Depression.  Continue paroxetine.  6.  Obesity.  His calculated BMI is 30.5.     History of Present Illness  03/30/2020  Pulmonary/ 1st office eval/[facility]  Chief Complaint  Patient presents with  . Consult    pt states  taking xaralto are clots dissolving in lungs. is it ok to exercise  Dyspnea:  MMRC1 = can walk nl pace, flat grade, can't hurry or go uphills or steps s sob but not back on eliptical yet   Cough: none  Sleep: flat / on side / one pillow  SABA use: none   No obvious day to day or daytime variability or assoc excess/ purulent sputum or mucus plugs or hemoptysis or cp or chest tightness, subjective wheeze or overt sinus or hb symptoms.   Sleeping  without nocturnal  or early am exacerbation  of respiratory  c/o's or need for noct saba. Also denies any obvious fluctuation of symptoms with weather or environmental changes or other aggravating or alleviating factors except as outlined above   No unusual exposure hx or h/o childhood pna/ asthma or  knowledge of premature birth.  Current Allergies, Complete Past Medical History, Past Surgical History, Family History, and Social History were reviewed in [employer] record.  ROS  The following are not active complaints unless bolded Hoarseness, sore throat, dysphagia, dental problems, itching, sneezing,  nasal congestion or discharge of excess mucus or purulent secretions, ear ache,   fever, chills, sweats, unintended wt loss or wt gain, classically pleuritic or exertional cp,  orthopnea pnd or arm/hand swelling  or leg swelling, presyncope, palpitations, abdominal pain, anorexia, nausea, vomiting, diarrhea  or change in bowel habits or change in bladder habits, change in stools or change in urine, dysuria, hematuria,  rash, arthralgias, visual complaints, headache, numbness, weakness or ataxia or problems with walking or coordination,  change in mood or  memory.           Past Medical History:  Diagnosis Date  . Arthritis    neck and back   . Chronic neck pain   . Depression   . ED (erectile dysfunction)   . GERD (gastroesophageal reflux disease)    dysphagia  . Gout    sees Dr. Leigh Aurora   . Hyperlipidemia   . Hypertension   . OSA (obstructive sleep apnea) 12/18/2017   Severe with AHI at 70.1/hr and is now on CPAP at [place]  . Prostate cancer [facility])    history of  . Pulmonary embolism ([place]) 06/2019  . Sleep apnea    snoring/never checked    Outpatient Medications Prior to Visit  Medication Sig Dispense Refill  . allopurinol (ZYLOPRIM) 300 MG tablet TAKE 1 TABLET BY MOUTH EVERY DAY 30 tablet 3  . amLODipine (NORVASC) 5 MG tablet TAKE 1 TABLET BY MOUTH EVERY DAY 90 tablet 3  . atorvastatin (LIPITOR) 40 MG tablet TAKE 1 TABLET BY MOUTH EVERY DAY 90 tablet 3  . cyclobenzaprine (FLEXERIL) 10 MG tablet TAKE 1 TABLET BY MOUTH 3 TIMES DAILY AS NEEDED FOR MUSCLE SPASMS 90 tablet 5  . gabapentin (NEURONTIN) 100 MG capsule TAKE 1 CAPSULE BY MOUTH 2 TIMES  DAILY 180 capsule 0  . lisinopril-hydrochlorothiazide (ZESTORETIC) 20-25 MG tablet TAKE 1 TABLET BY MOUTH EVERY DAY 90 tablet 3  . naproxen (NAPROSYN) 500 MG tablet TAKE 1 TABLET BY MOUTH TWICE A DAY WITH A MEAL 60 tablet 5  . [START ON 04/21/2020] oxyCODONE-acetaminophen (PERCOCET) 10-325 MG tablet Take 1 tablet by mouth every 6 (six) hours as needed for pain. 120 tablet 0  . PARoxetine (PAXIL) 20 MG tablet TAKE 1 TABLET BY MOUTH EVERY MORNING 90 tablet 0  . psyllium (METAMUCIL) 58.6 % packet Take 1 packet by mouth daily as needed (for constipation).     . rivaroxaban (XARELTO) 20 MG TABS tablet Take 1 tablet (20 mg total) by mouth daily with supper. 90 tablet 3      Objective:     BP 124/78   Pulse 82   Temp 98.2 F (36.8 C) (Oral)   Ht 6' (1.829 m)   Wt 225 lb (102.1 kg)   SpO2 95% Comment: Room air  BMI 30.52 kg/m   SpO2: 95 %(Room air)   Mildly obese amb wm nad   HEENT : pt wearing mask not removed for exam due to covid -19  concerns.    NECK :  without JVD/Nodes/TM/ nl carotid upstrokes bilaterally   LUNGS: no acc muscle use,  Nl contour chest which is clear to A and P bilaterally without cough on insp or exp maneuvers   CV:  RRR  no s3 or murmur or increase in P2, and no edema   ABD:  soft and nontender with nl inspiratory excursion in the supine position. No bruits or organomegaly appreciated, bowel sounds nl  MS:  Nl gait/ ext warm without deformities, calf tenderness, cyanosis or clubbing No obvious joint restrictions   SKIN: warm and dry without lesions    NEURO:  alert, approp, nl sensorium with  no motor or cerebellar deficits apparent.         Assessment   Pulmonary embolism ([place]) Dx 06/15/2019 CTa . Positive for acute PE with CT evidence of right heart strain (RV/LV Ratio = 1.9) consistent with at least submassive (intermediate risk) PE. The presence of right heart strain has been associated with an increased risk of morbidity and mortality - Echo  06/16/2019:  right ventricle systolic function was moderately reduced and the cavity was mildly enlarged.  It was noted left with bowing the interatrial septum, suggestive of elevated right atrial pressure.  - Venous dopplers 06/16/2019   Ultrasonography of his lower extremities showed acute deep vein thrombosis involving the left femoral vein and left posterior tibial veins.  - Echo 03/31/2020 >>>  - Venous doppplers 03/31/2020 >>>   He did not have a w/u for hypercoagulability but this was apparently not provoked so either needs w/u for this or stay on longterm DOAC and work on wt loss are the options   Concerned with concomitant use of naprosyn > advised to at least reduce to q am with bfast if tolerates and discuss other options for arthritis rx with PCP (celebrex a bit less risky but all carry some risk of GIB)   Discussed in detail all the  indications, usual  risks and alternatives  relative to the benefits with patient who agrees to proceed with w/u with repeat studies and if nl then rec reduce to prophylactic dosing or see hematology to complete the w/u if wants to come off [place] in future         Each maintenance medication was reviewed in detail including emphasizing most importantly the difference between maintenance and prns and under what circumstances the prns are to be triggered using an action plan format where appropriate.  Total time for H and P, extenisve chart review, counseling, teaching device and generating customized AVS unique to this office visit for pt new to me / charting = 60 min          Christinia Gully, MD 03/30/2020

## 2020-03-30 NOTE — Patient Instructions (Addendum)
Ok to reduce the naprosyn to once a day with meals if you don't need it for arthritis/joint pain    To get the most out of exercise, you need to be continuously aware that you are short of breath, but never out of breath, for 30 minutes daily as a minimum. As you improve, it will actually be easier for you to do the same amount of exercise  in  30 minutes so always push to the level where you are short of breath.      Please remember to go to the  x-ray department  for your tests - we will call you with the results when they are available     We will call you to set up a venous doppler and heart echo and call you with the results and if they are normal it would be reasonable to take the xarelto in lower dose to prevent future clots

## 2020-03-31 ENCOUNTER — Encounter: Payer: Self-pay | Admitting: Internal Medicine

## 2020-03-31 NOTE — Progress Notes (Signed)
Patient identification verified. Results of recent CXR reviewed.  Per Dr. Melvyn Novas, reviewed cxr and no acute change so no change in recommendations made at [facility].  Patient verbalized understanding of results.

## 2020-03-31 NOTE — Assessment & Plan Note (Addendum)
Dx 06/15/2019 CTa . Positive for acute PE with CT evidence of right heart strain (RV/LV Ratio = 1.9) consistent with at least submassive (intermediate risk) PE. The presence of right heart strain has been associated with an increased risk of morbidity and mortality - Echo 06/16/2019:  right ventricle systolic function was moderately reduced and the cavity was mildly enlarged.  It was noted left with bowing the interatrial septum, suggestive of elevated right atrial pressure.  - Venous dopplers 06/16/2019   Ultrasonography of his lower extremities showed acute deep vein thrombosis involving the left femoral vein and left posterior tibial veins.  - Echo 03/31/2020 >>>  - Venous doppplers 03/31/2020 >>>   He did not have a w/u for hypercoagulability but this was apparently not provoked so either needs w/u for this or stay on longterm DOAC and work on wt loss are the options   Concerned with concomitant use of naprosyn > advised to at least reduce to q am with bfast if tolerates and discuss other options for arthritis rx with PCP (celebrex a bit less risky but all carry some risk of GIB)   Discussed in detail all the  indications, usual  risks and alternatives  relative to the benefits with patient who agrees to proceed with w/u with repeat studies and if nl then rec reduce to prophylactic dosing or see hematology to complete the w/u if wants to come off [place] in future         Each maintenance medication was reviewed in detail including emphasizing most importantly the difference between maintenance and prns and under what circumstances the prns are to be triggered using an action plan format where appropriate.  Total time for H and P, extenisve chart review, counseling, teaching device and generating customized AVS unique to this office visit for pt new to me / charting = 60 min

## 2020-04-04 ENCOUNTER — Encounter: Payer: Self-pay | Admitting: Family Medicine

## 2020-04-04 ENCOUNTER — Ambulatory Visit (INDEPENDENT_AMBULATORY_CARE_PROVIDER_SITE_OTHER): Payer: Medicare Other | Admitting: Family Medicine

## 2020-04-04 ENCOUNTER — Other Ambulatory Visit: Payer: Self-pay | Admitting: Family Medicine

## 2020-04-04 ENCOUNTER — Other Ambulatory Visit: Payer: Self-pay

## 2020-04-04 VITALS — BP 110/60 | HR 88 | Temp 97.7°F | Wt 228.2 lb

## 2020-04-04 DIAGNOSIS — B070 Plantar wart: Secondary | ICD-10-CM

## 2020-04-04 DIAGNOSIS — T22211A Burn of second degree of right forearm, initial encounter: Secondary | ICD-10-CM

## 2020-04-04 MED ORDER — SILDENAFIL CITRATE 100 MG PO TABS: 100.0000 mg | ORAL_TABLET | Freq: Every day | ORAL | 11 refills | Status: DC | PRN

## 2020-04-04 NOTE — Progress Notes (Signed)
   Subjective:    Patient ID: Ricky Meyers, male    DOB: 12-Aug-1946, 74 y.o.   MRN: [id number]  HPI Here for several issues. First about 3 weeks ago he noticed a lesion on the left 4th toe that hurts when he walks on it. No recent trauma. Also he asks me to check his right arm. 2 days ago while cooking a roast in a slow cooker he spilled some hot liquid on the arm. He immediately rinsed it with cold water and he has been applying aloe lotion to it. Today it feels fine with no pain.    Review of Systems  Constitutional: Negative.   Respiratory: Negative.   Cardiovascular: Negative.   Skin: Positive for wound.       Objective:   Physical Exam Constitutional:      Appearance: Normal appearance.  Cardiovascular:     Rate and Rhythm: Normal rate and regular rhythm.     Pulses: Normal pulses.     Heart sounds: Normal heart sounds.  Pulmonary:     Effort: Pulmonary effort is normal.     Breath sounds: Normal breath sounds.  Skin:    Comments: The plantar surface of the left 4th toe has a small plantar wart. The skin on the dorsal right forearm is red with a few small blisters. No tenderness   Neurological:     Mental Status: He is alert.           Assessment & Plan:  For the plantar wart, this was treated with cryotherapy. The second degree burn on the arm looks very good actually and no further treatment is required.  Alysia Penna, MD

## 2020-04-13 ENCOUNTER — Ambulatory Visit (HOSPITAL_COMMUNITY)
Admission: RE | Admit: 2020-04-13 | Discharge: 2020-04-13 | Disposition: A | Discharge status: Home / Self Care | Payer: Medicare Other | Source: Ambulatory Visit | Attending: Internal Medicine | Admitting: Internal Medicine

## 2020-04-13 ENCOUNTER — Other Ambulatory Visit: Payer: Self-pay

## 2020-04-13 DIAGNOSIS — I2694 Multiple subsegmental pulmonary emboli without acute cor pulmonale: Secondary | ICD-10-CM | POA: Insufficient documentation

## 2020-04-14 NOTE — Progress Notes (Signed)
Spoke with patient and provided study results.  He verbalized understanding.

## 2020-04-19 DIAGNOSIS — L909 Atrophic disorder of skin, unspecified: Secondary | ICD-10-CM | POA: Diagnosis not present

## 2020-04-20 ENCOUNTER — Telehealth: Payer: Self-pay | Admitting: Family Medicine

## 2020-04-20 ENCOUNTER — Ambulatory Visit (HOSPITAL_COMMUNITY): Payer: Medicare Other | Attending: Cardiovascular Disease

## 2020-04-20 ENCOUNTER — Other Ambulatory Visit: Payer: Self-pay

## 2020-04-20 DIAGNOSIS — I2694 Multiple subsegmental pulmonary emboli without acute cor pulmonale: Secondary | ICD-10-CM | POA: Insufficient documentation

## 2020-04-20 NOTE — Telephone Encounter (Signed)
Spoke with the pharmacy, permission to fill early has been given. Pharmacy will contact the patient. Nothing further needed.

## 2020-04-20 NOTE — Telephone Encounter (Signed)
Please advise. Pt has a refill at the pharmacy that is dated for tomorrow. Ok to fill today?

## 2020-04-20 NOTE — Telephone Encounter (Signed)
The patient is needing a refill on oxyCODONE-acetaminophen (PERCOCET) 10-325 MG tablet  He took his last one yesterday because there was 31 days in the month of May. Can the pharmacy fill this Rx 1 day early.     [facility] - [place], [place] - [address]  [address], [place] [address]  Phone:  [phone] Fax:  [phone]

## 2020-04-20 NOTE — Telephone Encounter (Signed)
Yes it is okay to fill today

## 2020-04-21 ENCOUNTER — Telehealth: Payer: Self-pay | Admitting: Internal Medicine

## 2020-04-21 NOTE — Telephone Encounter (Signed)
Spoke with patient and provided his echocardiogram results and recommendations provided by Dr. Melvyn Novas.  He verbalized understanding.  Scheduled patient for a 3 month f/u with Dr. Melvyn Novas.  Nothing further needed.

## 2020-05-13 ENCOUNTER — Other Ambulatory Visit: Payer: Self-pay | Admitting: Family Medicine

## 2020-05-17 ENCOUNTER — Ambulatory Visit: Payer: Medicare Other | Admitting: Family Medicine

## 2020-05-19 ENCOUNTER — Encounter: Payer: Self-pay | Admitting: Family Medicine

## 2020-05-19 ENCOUNTER — Other Ambulatory Visit: Payer: Self-pay

## 2020-05-19 ENCOUNTER — Ambulatory Visit (INDEPENDENT_AMBULATORY_CARE_PROVIDER_SITE_OTHER): Payer: Medicare Other | Admitting: Family Medicine

## 2020-05-19 VITALS — BP 120/60 | HR 84 | Temp 97.8°F | Wt 234.8 lb

## 2020-05-19 DIAGNOSIS — D485 Neoplasm of uncertain behavior of skin: Secondary | ICD-10-CM | POA: Diagnosis not present

## 2020-05-19 DIAGNOSIS — C441222 Squamous cell carcinoma of skin of right lower eyelid, including canthus: Secondary | ICD-10-CM | POA: Diagnosis not present

## 2020-05-19 DIAGNOSIS — F1190 Opioid use, unspecified, uncomplicated: Secondary | ICD-10-CM | POA: Diagnosis not present

## 2020-05-19 DIAGNOSIS — M159 Polyosteoarthritis, unspecified: Secondary | ICD-10-CM | POA: Diagnosis not present

## 2020-05-19 MED ORDER — OXYCODONE-ACETAMINOPHEN 10-325 MG PO TABS: 1.0000 | ORAL_TABLET | Freq: Four times a day (QID) | ORAL | 0 refills | Status: AC | PRN

## 2020-05-19 MED ORDER — OXYCODONE-ACETAMINOPHEN 10-325 MG PO TABS: 1.0000 | ORAL_TABLET | Freq: Four times a day (QID) | ORAL | 0 refills | Status: DC | PRN

## 2020-05-19 NOTE — Progress Notes (Signed)
   Subjective:    Patient ID: Ricky Meyers, male    DOB: 02/02/1946, 74 y.o.   MRN: [id number]  HPI Here for pain management, he is doing well.  Indication for chronic opioid: osteoarthritis  Medication and dose: Percocet 10-325  # pills per month: 120 Last UDS date: 11-23-19 Opioid Treatment Agreement signed (Y/N): 01-15-19 Opioid Treatment Agreement last reviewed with patient:  05-19-20 NCCSRS reviewed this encounter (include red flags): Yes    Review of Systems     Objective:   Physical Exam        Assessment & Plan:  Pain management, meds were refilled.  Alysia Penna, MD

## 2020-05-24 DIAGNOSIS — B078 Other viral warts: Secondary | ICD-10-CM | POA: Diagnosis not present

## 2020-05-24 DIAGNOSIS — D485 Neoplasm of uncertain behavior of skin: Secondary | ICD-10-CM | POA: Diagnosis not present

## 2020-05-24 DIAGNOSIS — C441222 Squamous cell carcinoma of skin of right lower eyelid, including canthus: Secondary | ICD-10-CM | POA: Diagnosis not present

## 2020-05-29 ENCOUNTER — Other Ambulatory Visit: Payer: Self-pay | Admitting: Family Medicine

## 2020-06-05 ENCOUNTER — Other Ambulatory Visit: Payer: Self-pay | Admitting: Family Medicine

## 2020-06-12 DIAGNOSIS — L814 Other melanin hyperpigmentation: Secondary | ICD-10-CM | POA: Diagnosis not present

## 2020-06-12 DIAGNOSIS — L281 Prurigo nodularis: Secondary | ICD-10-CM | POA: Diagnosis not present

## 2020-06-12 DIAGNOSIS — D225 Melanocytic nevi of trunk: Secondary | ICD-10-CM | POA: Diagnosis not present

## 2020-06-12 DIAGNOSIS — L905 Scar conditions and fibrosis of skin: Secondary | ICD-10-CM | POA: Diagnosis not present

## 2020-06-12 DIAGNOSIS — L570 Actinic keratosis: Secondary | ICD-10-CM | POA: Diagnosis not present

## 2020-06-12 DIAGNOSIS — L820 Inflamed seborrheic keratosis: Secondary | ICD-10-CM | POA: Diagnosis not present

## 2020-06-12 DIAGNOSIS — Z85828 Personal history of other malignant neoplasm of skin: Secondary | ICD-10-CM | POA: Diagnosis not present

## 2020-06-14 DIAGNOSIS — D2310 Other benign neoplasm of skin of unspecified eyelid, including canthus: Secondary | ICD-10-CM | POA: Diagnosis not present

## 2020-06-14 DIAGNOSIS — Z09 Encounter for follow-up examination after completed treatment for conditions other than malignant neoplasm: Secondary | ICD-10-CM | POA: Diagnosis not present

## 2020-06-19 ENCOUNTER — Other Ambulatory Visit: Payer: Self-pay | Admitting: Family Medicine

## 2020-06-29 ENCOUNTER — Other Ambulatory Visit: Payer: Self-pay

## 2020-06-29 ENCOUNTER — Other Ambulatory Visit: Payer: Self-pay | Admitting: Family Medicine

## 2020-06-29 ENCOUNTER — Other Ambulatory Visit: Payer: Medicare Other

## 2020-06-29 DIAGNOSIS — Z20822 Contact with and (suspected) exposure to covid-19: Secondary | ICD-10-CM

## 2020-06-30 LAB — SARS-COV-2, NAA 2 DAY TAT

## 2020-06-30 LAB — NOVEL CORONAVIRUS, NAA: SARS-CoV-2, NAA: NOT DETECTED

## 2020-07-03 ENCOUNTER — Telehealth: Payer: Self-pay | Admitting: Family Medicine

## 2020-07-03 ENCOUNTER — Encounter: Payer: Self-pay | Admitting: Internal Medicine

## 2020-07-03 ENCOUNTER — Ambulatory Visit (INDEPENDENT_AMBULATORY_CARE_PROVIDER_SITE_OTHER): Payer: Medicare Other | Admitting: Internal Medicine

## 2020-07-03 ENCOUNTER — Other Ambulatory Visit: Payer: Self-pay

## 2020-07-03 DIAGNOSIS — I2694 Multiple subsegmental pulmonary emboli without acute cor pulmonale: Secondary | ICD-10-CM

## 2020-07-03 DIAGNOSIS — R0600 Dyspnea, unspecified: Secondary | ICD-10-CM | POA: Diagnosis not present

## 2020-07-03 DIAGNOSIS — R0609 Other forms of dyspnea: Secondary | ICD-10-CM

## 2020-07-03 DIAGNOSIS — I10 Essential (primary) hypertension: Secondary | ICD-10-CM

## 2020-07-03 LAB — CBC WITH DIFFERENTIAL/PLATELET
Basophils Absolute: 0 10*3/uL (ref 0.0–0.1)
Basophils Relative: 0.5 % (ref 0.0–3.0)
Eosinophils Absolute: 0.2 10*3/uL (ref 0.0–0.7)
Eosinophils Relative: 1.6 % (ref 0.0–5.0)
HCT: 41.5 % (ref 39.0–52.0)
Hemoglobin: 14 g/dL (ref 13.0–17.0)
Lymphocytes Relative: 16.7 % (ref 12.0–46.0)
Lymphs Abs: 1.6 10*3/uL (ref 0.7–4.0)
MCHC: 33.7 g/dL (ref 30.0–36.0)
MCV: 95.1 fl (ref 78.0–100.0)
Monocytes Absolute: 1 10*3/uL (ref 0.1–1.0)
Monocytes Relative: 10.1 % (ref 3.0–12.0)
Neutro Abs: 6.9 10*3/uL (ref 1.4–7.7)
Neutrophils Relative %: 71.1 % (ref 43.0–77.0)
Platelets: 192 10*3/uL (ref 150.0–400.0)
RBC: 4.37 Mil/uL (ref 4.22–5.81)
RDW: 14.9 % (ref 11.5–15.5)
WBC: 9.6 10*3/uL (ref 4.0–10.5)

## 2020-07-03 LAB — BASIC METABOLIC PANEL WITH GFR
BUN: 18 mg/dL (ref 6–23)
CO2: 25 meq/L (ref 19–32)
Calcium: 10 mg/dL (ref 8.4–10.5)
Chloride: 103 meq/L (ref 96–112)
Creatinine, Ser: 1.03 mg/dL (ref 0.40–1.50)
GFR: 70.13 mL/min
Glucose, Bld: 107 mg/dL — ABNORMAL HIGH (ref 70–99)
Potassium: 4.1 meq/L (ref 3.5–5.1)
Sodium: 137 meq/L (ref 135–145)

## 2020-07-03 MED ORDER — TELMISARTAN-HCTZ 80-25 MG PO TABS
1.0000 | ORAL_TABLET | Freq: Every day | ORAL | 11 refills | Status: DC
Start: 2020-07-03 — End: 2021-07-02

## 2020-07-03 MED ORDER — RIVAROXABAN 10 MG PO TABS: 10.0000 mg | ORAL_TABLET | Freq: Every day | ORAL | 11 refills | Status: DC

## 2020-07-03 NOTE — Assessment & Plan Note (Signed)
Dx 06/15/2019 CTa . Positive for acute PE with CT evidence of right heart strain (RV/LV Ratio = 1.9) consistent with at least submassive (intermediate risk) PE. The presence of right heart strain has been associated with an increased risk of morbidity and mortality - Echo 06/16/2019:  right ventricle systolic function was moderately reduced and the cavity was mildly enlarged.  It was noted left with bowing the interatrial septum, suggestive of elevated right atrial pressure.  - Venous dopplers 06/16/2019   Ultrasonography of his lower extremities showed acute deep vein thrombosis involving the left femoral vein and left posterior tibial veins.  - Echo    04/20/2020   Nl RV / RA  - Venous doppplers 04/13/20 > nl  - 07/03/2020   Changed xarelto to 10 mg daily    At this point reasonable to change to prophylactic dose since has tolerated the full dose so well and his greatest risk factor for an unprovoked PE is a h/o unprovoked PE  Discussed in detail all the  indications, usual  risks and alternatives  relative to the benefits with patient who agrees to proceed with Rx as outlined.

## 2020-07-03 NOTE — Assessment & Plan Note (Addendum)
Try off acei 07/03/2020 due to hoarseness / unexplained sob  Lab Results  Component Value Date   CREATININE 1.03 07/03/2020   CREATININE 1.19 06/17/2019   CREATININE 1.10 06/15/2019     In the best review of chronic cough to date ( NEJM 2016 375 8832-5498) ,  ACEi are now felt to cause cough in up to  20% of pts which is a 4 fold increase from previous reports and does not include the variety of non-specific complaints we see in pulmonary clinic in pts on ACEi but previously attributed to another dx like  Copd/asthma and  include PNDS, throat congestion, "bronchitis", unexplained dyspnea and noct "strangling" sensations, and hoarseness, but also  atypical /refractory GERD symptoms like dysphagia and "bad heartburn"   The only way I know  to prove this is not an "ACEi Case" is a trial off ACEi x a minimum of 6 weeks then regroup.   >>> try micardis 80-25 one half daily to increase to one daily if bp not optimal    Medical decision making was a moderate level of complexity in this case because of  two chronic conditions /diagnoses requiring extra time for  H and P, chart review, counseling,  and generating customized AVS unique to this office visit and charting.   Each maintenance medication was reviewed in detail including emphasizing most importantly the difference between maintenance and prns and under what circumstances the prns are to be triggered using an action plan format where appropriate. Please see avs for details which were reviewed in writing by both me and my nurse and patient given a written copy highlighted where appropriate with yellow highlighter for the patient's continued care at home along with an updated version of their medications.  Patient was asked to maintain medication reconciliation by comparing this list to the actual medications being used at home and to contact this office right away if there is a conflict or discrepancy.

## 2020-07-03 NOTE — Progress Notes (Signed)
Spoke with pt and notified of results per Dr. Wert. Pt verbalized understanding and denied any questions. 

## 2020-07-03 NOTE — Patient Instructions (Addendum)
Ok to reduce the naprosyn to once a day with meals if you don't need it for arthritis/joint pain   To get the most out of exercise, you need to be continuously aware that you are short of breath, but never out of breath, for 30 minutes daily as a minimum. As you improve, it will actually be easier for you to do the same amount of exercise  in  30 minutes so always push to the level where you are short of breath.      Stop lisinopril and your throat issues should improve over several week  micardis 80-25  Start with one half daily for at least 6 weeks to see if it's the right strength and your throat congestion goes away  Reduce the xarelto 10 mg daily   Please schedule a follow up visit in 3 months but call sooner if needed

## 2020-07-03 NOTE — Progress Notes (Addendum)
Ricky Meyers, male    DOB: April 16, 1946,    MRN: [id number]   Brief patient profile:  61 yowm never smoker very active including eliptical x 40 min x 4 times weekly  Then:    Admit date: 06/15/2019 Discharge date: 06/18/2019  Admitted From: Home  Disposition:  Home    Brief/Interim Summary: 74 year old male who presented with dyspnea. He does have significant past medical history for hypertension, dyslipidemia and obesity. Reported several months of declining energy and decreased exercise tolerance. Worsening dyspnea on exertion to the point where he became dyspneic with minimal efforts. On his initial physical examination blood pressure 128/79, heart rate 91, respiratory rate 19, oxygen saturation 96%, moist mucous membranes, lungs clear to auscultation bilaterally, heart S1-S2 present and rhythmic, abdomen soft nontender, no lower extremity edema. Sodium 139, potassium 4.2, chloride 107, bicarb 22, glucose 118, BUN 24, creatinine 1.1, high sensitive troponin 259, white count 9.8, hemoglobin 13.2, hematocrit 39.3, platelets 170.  SARS COVID-19 was negative Chest x-ray negative for infiltrates. CT chest with acute bilateral lobar and segmental pulmonary emboli, involving all lobes of both lungs, positive right heart strain, with RV/LV ratio 1.9.EKG 108 bpm, right axis deviation, interventricular conduction delay, sinus rhythm, no ST segment changes, poor R wave progression, negative T waves in V5 to V6.  Patient was admitted to the hospital working diagnosis of acute submassive pulmonary embolism.   1.  Acute pulmonary embolism, submassive,/ acute left femoral and left posterior tibial veins deep vein thrombosis. Complicated with acute core pulmonale. Patient was admitted to the medical telemetry unit, he received anticoagulation with unfractionated IV heparin with good toleration.  His blood pressure remained stable, with systolic between 712 and 458 mmHG, his oxygenation was 95 to  97% on room air.  Further work-up with echocardiography showed a normal LV systolic function, ejection fraction 55 to 60%, the right ventricle systolic function was moderately reduced and the cavity was mildly enlarged.  It was noted left with bowing the interatrial septum, suggestive of elevated right atrial pressure.  Ultrasonography of his lower extremities showed acute deep vein thrombosis involving the left femoral vein and left posterior tibial veins.  Right lower extremity negative for deep vein thrombosis.  Patient was able to ambulate with physical therapy with no difficulties.  Patient has been transitioned to oral anticoagulation with rivaroxaban.  He will follow-up as an outpatient within 7 days.  2.  Hypertension.  Blood pressure remained well controlled, patient was continued on amlodipine, at discharge he will resume  3.  Dyslipidemia.  Continue statin therapy.  4.  Gout.  No acute flare, continue allopurinol.  5.  Depression.  Continue paroxetine.  6.  Obesity.  His calculated BMI is 30.5.     History of Present Illness  03/30/2020  Pulmonary/ 1st office eval/Ricky Meyers  Chief Complaint  Patient presents with  . Consult    pt states  taking xaralto are clots dissolving in lungs. is it ok to exercise  Dyspnea:  MMRC1 = can walk nl pace, flat grade, can't hurry or go uphills or steps s sob but not back on eliptical yet   Cough: none  Sleep: flat / on side / one pillow  SABA use: none  rec Ok to reduce the naprosyn to once a day with meals if you don't need it for arthritis/joint pain  To get the most out of exercise      07/03/2020  f/u ov/Ricky Meyers re: PE/ dvt  Chief Complaint  Patient presents  with  . Follow-up    Everything is going well.   Dyspnea:  [facility] = can walk nl pace, flat grade, can't hurry or go uphills or steps s sob  But not as good as he was prior to PE while going to gym at wt 225  Cough: slt raspy x years  Sleeping: on cpap/ does fine SABA use:  none  02: none    No obvious day to day or daytime variability or assoc excess/ purulent sputum or mucus plugs or hemoptysis or cp or chest tightness, subjective wheeze or overt sinus or hb symptoms.   Sleeping as above without nocturnal  or early am exacerbation  of respiratory  c/o's or need for noct saba. Also denies any obvious fluctuation of symptoms with weather or environmental changes or other aggravating or alleviating factors except as outlined above   No unusual exposure hx or h/o childhood pna/ asthma or knowledge of premature birth.  Current Allergies, Complete Past Medical History, Past Surgical History, Family History, and Social History were reviewed in [employer] record.  ROS  The following are not active complaints unless bolded Hoarseness, sore throat= globus daytime only , dysphagia, dental problems, itching, sneezing,  nasal congestion or discharge of excess mucus or purulent secretions, ear ache,   fever, chills, sweats, unintended wt loss or wt gain, classically pleuritic or exertional cp,  orthopnea pnd or arm/hand swelling  or leg swelling, presyncope, palpitations, abdominal pain, anorexia, nausea, vomiting, diarrhea  or change in bowel habits or change in bladder habits, change in stools or change in urine, dysuria, hematuria,  rash, arthralgias, visual complaints, headache, numbness, weakness or ataxia or problems with walking or coordination,  change in mood or  memory.        Current Meds  Medication Sig  . allopurinol (ZYLOPRIM) 300 MG tablet TAKE 1 TABLET BY MOUTH EVERY DAY  . amLODipine (NORVASC) 5 MG tablet TAKE 1 TABLET BY MOUTH EVERY DAY  . atorvastatin (LIPITOR) 40 MG tablet TAKE 1 TABLET BY MOUTH EVERY DAY  . cyclobenzaprine (FLEXERIL) 10 MG tablet TAKE 1 TABLET BY MOUTH 3 TIMES DAILY AS NEEDED FOR MUSCLE SPASMS  . gabapentin (NEURONTIN) 100 MG capsule TAKE 1 CAPSULE BY MOUTH 2 TIMES DAILY  . lisinopril-hydrochlorothiazide  (ZESTORETIC) 20-25 MG tablet TAKE 1 TABLET BY MOUTH EVERY DAY  . naproxen (NAPROSYN) 500 MG tablet TAKE 1 TABLET BY MOUTH TWICE A DAY WITH A MEAL  . [START ON 07/22/2020] oxyCODONE-acetaminophen (PERCOCET) 10-325 MG tablet Take 1 tablet by mouth every 6 (six) hours as needed for pain.  Marland Kitchen PARoxetine (PAXIL) 20 MG tablet TAKE 1 TABLET BY MOUTH EVERY MORNING  . psyllium (METAMUCIL) 58.6 % packet Take 1 packet by mouth daily as needed (for constipation).   . sildenafil (VIAGRA) 100 MG tablet Take 1 tablet (100 mg total) by mouth daily as needed for erectile dysfunction.  Alveda Reasons 20 MG TABS tablet TAKE 1 TABLET BY MOUTH EVERY DAY WITH SUPPER                   Past Medical History:  Diagnosis Date  . Arthritis    neck and back   . Chronic neck pain   . Depression   . ED (erectile dysfunction)   . GERD (gastroesophageal reflux disease)    dysphagia  . Gout    sees Dr. Leigh Aurora   . Hyperlipidemia   . Hypertension   . OSA (obstructive sleep apnea) 12/18/2017  Severe with AHI at 70.1/hr and is now on CPAP at 11cm H2o  . Prostate cancer [facility])    history of  . Pulmonary embolism ([place]) 06/2019  . Sleep apnea    snoring/never checked       Objective:    Wt Readings from Last 3 Encounters:  07/03/20 233 lb (105.7 kg)  05/19/20 234 lb 12.8 oz (106.5 kg)  04/04/20 228 lb 3.2 oz (103.5 kg)    amb pleasant hoarse wm with freq thorat clearing   Vital signs reviewed - Note on arrival 07/03/2020  02 sats  98% on RA       HEENT : pt wearing mask not removed for exam due to covid -19 concerns.    NECK :  without JVD/Nodes/TM/ nl carotid upstrokes bilaterally   LUNGS: no acc muscle use,  Nl contour chest which is clear to A and P bilaterally without cough on insp or exp maneuvers   CV:  RRR  no s3 or murmur or increase in P2, and no edema   ABD:  soft and nontender with nl inspiratory excursion in the supine position. No bruits or organomegaly appreciated, bowel sounds  nl  MS:  Nl gait/ ext warm without deformities, calf tenderness, cyanosis or clubbing No obvious joint restrictions   SKIN: warm and dry without lesions    NEURO:  alert, approp, nl sensorium with  no motor or cerebellar deficits apparent.       Lab Results  Component Value Date   WBC 9.6 07/03/2020   HGB 14.0 07/03/2020   HCT 41.5 07/03/2020   MCV 95.1 07/03/2020   PLT 192.0 07/03/2020       EOS                                                               0.2                                    07/03/2020   Assessment

## 2020-07-03 NOTE — Assessment & Plan Note (Addendum)
Not back to baseline since quit doing eliptical at onset of COVID 19 pandemic March 2020 but no reason by cxr or echo to think he has any reason related to PE to experience doe   ?? Could this be an acei case > see hbp  ?? Just deconditioning >>>  Reconditioning  ?? Anemia/renal dz > check cbc/ bmet and again advised nsaids should be prn, not daily   >>> advised re reconditioning using submax level of workload at low resistance for now    >>>  F/u in 3 months, call sooner if needed

## 2020-07-03 NOTE — Progress Notes (Signed)
  Chronic Care Management   Note  07/03/2020 Name: Ricky Meyers MRN: [id number] DOB: Jul 26, 1946  Ricky Meyers is a 74 y.o. year old male who is a primary care patient of [name], MD. I reached out to Vance Peper by phone today in response to a referral sent by Ricky Meyers PCP, [name], MD.   [name] was given information about Chronic Care Management services today including:  1. CCM service includes personalized support from designated clinical staff supervised by his physician, including individualized plan of care and coordination with other care providers 2. 24/7 contact phone numbers for assistance for urgent and routine care needs. 3. Service will only be billed when office clinical staff spend 20 minutes or more in a month to coordinate care. 4. Only one practitioner may furnish and bill the service in a calendar month. 5. The patient may stop CCM services at any time (effective at the end of the month) by phone call to the office staff.   Patient agreed to services and verbal consent obtained.   Follow up plan:   Carley Perdue [employer] Scheduler

## 2020-07-11 ENCOUNTER — Telehealth: Payer: Self-pay | Admitting: Internal Medicine

## 2020-07-11 NOTE — Telephone Encounter (Signed)
See last ov "07/03/2020   Changed xarelto to 10 mg daily "  So this is what I planned

## 2020-07-11 NOTE — Telephone Encounter (Signed)
Contacted pharmacy and clarified order. Nothing further needed.

## 2020-07-11 NOTE — Telephone Encounter (Signed)
At pt's last OV with MW 07/03/20, Rx for Xarelto 10mg  was sent to pharmacy for pt by MW.  Called and spoke with Tiffany at pt's pharmacy in regards to this. She said pt had notified them that he had just been on 20mg  Xarelto after Rx had been called in for him on 8/2. Tiffany wanted to clarify that the 10mg  Xarelto is the Rx that pt should be on or if he should be on 20mg  Xarelto.  Dr. Melvyn Novas, please advise.

## 2020-07-13 ENCOUNTER — Telehealth: Payer: Self-pay | Admitting: Family Medicine

## 2020-07-13 NOTE — Telephone Encounter (Signed)
Pt needs a form filled out. He would like a call when completed at [phone]  Placed in red folder

## 2020-07-14 NOTE — Telephone Encounter (Signed)
Lvm for pt letting him know form is ready for pick up.

## 2020-07-14 NOTE — Telephone Encounter (Signed)
The form is ready to be picked up

## 2020-07-14 NOTE — Telephone Encounter (Signed)
I have NO forms for him

## 2020-07-14 NOTE — Telephone Encounter (Signed)
Form placed in your red folder

## 2020-07-20 ENCOUNTER — Other Ambulatory Visit: Payer: Medicare Other

## 2020-07-20 ENCOUNTER — Other Ambulatory Visit: Payer: Self-pay

## 2020-07-20 ENCOUNTER — Telehealth: Payer: Self-pay | Admitting: Family Medicine

## 2020-07-20 DIAGNOSIS — Z20822 Contact with and (suspected) exposure to covid-19: Secondary | ICD-10-CM

## 2020-07-20 NOTE — Telephone Encounter (Signed)
Pt will be flying out to [place] on Saturday and is wanting his medication oxyCODONE-acetaminophen (PERCOCET) 10-325 MG tablet   Refilled early. Today will be 2 days early and tomorrow will be one day early.  Please call the pharmacy with yes or no answer  [facility] - [place], [place] - [address]  [address], [place] [address]  Phone:  [phone] Fax:  [phone]

## 2020-07-21 DIAGNOSIS — Z20822 Contact with and (suspected) exposure to covid-19: Secondary | ICD-10-CM | POA: Diagnosis not present

## 2020-07-21 DIAGNOSIS — Z03818 Encounter for observation for suspected exposure to other biological agents ruled out: Secondary | ICD-10-CM | POA: Diagnosis not present

## 2020-07-21 LAB — SARS-COV-2, NAA 2 DAY TAT

## 2020-07-21 LAB — NOVEL CORONAVIRUS, NAA: SARS-CoV-2, NAA: NOT DETECTED

## 2020-07-21 NOTE — Telephone Encounter (Signed)
Please tell the pharmacy to fill these today

## 2020-07-28 ENCOUNTER — Telehealth: Payer: Self-pay | Admitting: Pharmacist

## 2020-07-30 NOTE — Progress Notes (Deleted)
Chronic Care Management Pharmacy  Name: Ricky Meyers  MRN: [id number] DOB: 09/21/1946  Initial Planning Appointment: ***  Initial Questions: 1. Have you seen any other providers since your last visit? n/a 2. Any changes in your medicines or health? {YES/NO:21197}  Chief Complaint/ HPI  Ricky Meyers,  74 y.o. , male presents for their Initial CCM visit with the clinical pharmacist In office.  PCP : [name], MD  Their chronic conditions include: {CHL AMB CHRONIC MEDICAL CONDITIONS:[phone]}  Office Visits: -05/19/20 Alysia Penna, MD: Patient presented for main management follow up. Refilled Percocet 10-325 mg up to four times daily.   -04/04/20 Alysia Penna, MD: Patient presented with plantar wart on toe. Treated with cryotherapy. Examined second degree burn on arm and no further treatment was required.  -02/16/20 Alysia Penna, MD: Patient presented for main management follow up. Refilled Percocet 10-325 mg up to four times daily.  Consult Visit: -07/03/20 Christinia Gully, MD (pulmonology): Patient presented post PE follow up. Patient reports doing better post PE but complains of hoarseness and unexplained SOB. Trial of Micardis 80-25 1/2 tablet daily and increase to 1 tablet daily if BP not optimal to avoid possible ACE-I induced cough. Xarelto decreased to 10 mg (prophylactic dose). Follow up in 3 months.  -06/14/20 Tsl Sabino Dick (ophthalmology): Patient presented for follow up of benign neoplasm of eyelid. Unable to access notes.  -04/19/20 Martinique Marco (optometry): Patient presented for atrophic disorder of skin follow up. Unable to access notes.  -03/30/20 Christinia Gully, MD (pulmonology): Patient presented for PE and SOB follow up. No changes indicated today. Recommended to discuss with PCP about switching naproxen to celebrex due to GI bleed risk.   -02/11/20 Satira Sark (dermatology): Patient presented for follow up for squamous cell carcinoma. Unable to access  notes.  -09/01/19 Larae Grooms, MD (cardiology): Patient presented for OSA, PVC, HTN, and HLD follow up. Patient is increasing walking and played golf for the first time yesterday since DVT. EKG demonstrated normal sinus rhythm. Follow up in 1 year.  Medications: Outpatient Encounter Medications as of 07/31/2020  Medication Sig   allopurinol (ZYLOPRIM) 300 MG tablet TAKE 1 TABLET BY MOUTH EVERY DAY   amLODipine (NORVASC) 5 MG tablet TAKE 1 TABLET BY MOUTH EVERY DAY   atorvastatin (LIPITOR) 40 MG tablet TAKE 1 TABLET BY MOUTH EVERY DAY   cyclobenzaprine (FLEXERIL) 10 MG tablet TAKE 1 TABLET BY MOUTH 3 TIMES DAILY AS NEEDED FOR MUSCLE SPASMS   gabapentin (NEURONTIN) 100 MG capsule TAKE 1 CAPSULE BY MOUTH 2 TIMES DAILY   naproxen (NAPROSYN) 500 MG tablet TAKE 1 TABLET BY MOUTH TWICE A DAY WITH A MEAL   oxyCODONE-acetaminophen (PERCOCET) 10-325 MG tablet Take 1 tablet by mouth every 6 (six) hours as needed for pain.   PARoxetine (PAXIL) 20 MG tablet TAKE 1 TABLET BY MOUTH EVERY MORNING   psyllium (METAMUCIL) 58.6 % packet Take 1 packet by mouth daily as needed (for constipation).    rivaroxaban (XARELTO) 10 MG TABS tablet Take 1 tablet (10 mg total) by mouth daily.   sildenafil (VIAGRA) 100 MG tablet Take 1 tablet (100 mg total) by mouth daily as needed for erectile dysfunction.   telmisartan-hydrochlorothiazide (MICARDIS HCT) 80-25 MG tablet Take 1 tablet by mouth daily.   No facility-administered encounter medications on file as of 07/31/2020.     Current Diagnosis/Assessment:  Goals Addressed   None    Hypertension   BP goal is:  {CHL HP UPSTREAM Pharmacist BP ranges:[phone]}  Office blood pressures are  BP Readings from Last 3 Encounters:  07/03/20 110/70  05/19/20 120/60  04/04/20 110/60   Patient checks BP at home {CHL HP BP Monitoring Frequency:[phone]} Patient home BP readings are ranging: ***  Patient has failed these meds in the past: *** Patient  is currently {CHL Controlled/Uncontrolled:[phone]} on the following medications:   Micardis HCT 80-25 mg 1/2 or 1 tablet daily (dr wert wrote either)  Amlodipine 5 mg 1 tablet daily  We discussed {CHL HP Upstream Pharmacy discussion:[phone]}  Plan  Continue {CHL HP Upstream Pharmacy [phone]}     Hyperlipidemia   LDL goal < 70  Lipid Panel     Component Value Date/Time   CHOL 144 07/15/2018 0941   TRIG 74.0 07/15/2018 0941   HDL 59.30 07/15/2018 0941   LDLCALC 70 07/15/2018 0941   LDLDIRECT 161.4 09/29/2012 0811    Hepatic Function Latest Ref Rng & Units 07/15/2018 03/07/2016 12/03/2015  Total Protein 6.0 - 8.3 g/dL 6.5 7.0 7.2  Albumin 3.5 - 5.2 g/dL 4.1 4.5 3.5  AST 0 - 37 U/L _0 ALT 0 - 53 U/L _1 Alk Phosphatase 39 - 117 U/L 61 68 65  Total Bilirubin 0.2 - 1.2 mg/dL 0.7 0.7 1.0  Bilirubin, Direct 0.0 - 0.3 mg/dL 0.1 0.2 -     The 10-year ASCVD risk score Mikey Bussing DC Jr., et al., 2013) is: 20.7%   Values used to calculate the score:     Age: 57 years     Sex: Male     Is Non-Hispanic African American: No     Diabetic: No     Tobacco smoker: No     Systolic Blood Pressure: 222 mmHg     Is BP treated: Yes     HDL Cholesterol: 59.3 mg/dL     Total Cholesterol: 144 mg/dL   Patient has failed these meds in past: *** Patient is currently {CHL Controlled/Uncontrolled:[phone]} on the following medications:   Atorvastatin 40 mg 1 tablet daily  We discussed:  {CHL HP Upstream Pharmacy discussion:[phone]}  Plan Repeat Lipid panel Continue {CHL HP Upstream Pharmacy [phone]}  Chronic anticoagulation (DVT & PE)   Patient has failed these meds in past: none Patient is currently {CHL Controlled/Uncontrolled:[phone]} on the following medications:   Xarelto 10 mg 1 tablet daily  We discussed:  ***  Plan  Continue {CHL HP Upstream Pharmacy LNLGX:[id number]}   Osteoarthritis/pain   Patient has failed these meds in past:  *** Patient is currently {CHL Controlled/Uncontrolled:[phone]} on the following medications:   Oxycodone-APAP 10-325 mg up to 4 tablets daily  Cyclobenzaprine 10 mg 1 tablet TID  Naproxen 500 mg 1 tablet BID with a meal  Gabapentin 100 mg 1 capsule twice daily  We discussed:  ***  Plan  Continue {CHL HP Upstream Pharmacy XKGYJ:[id number]}   Gout    Lab Results  Component Value Date   LABURIC 5.4 07/15/2018   LABURIC 4.8 12/03/2015   LABURIC 4.5 12/02/2014   *** over past year. Counseled on avoidance of alcohol and low purine diet.  Patient is currently {CHL Controlled/Uncontrolled:[phone]} on:   ***  Plan Uric acid level? Continue {CHL HP Upstream Pharmacy OVZCH:[id number]}  Depression   Depression screen [facility] 2/9 07/15/2018 03/07/2016 01/23/2015  Decreased Interest 0 0 0  Down, Depressed, Hopeless 0 0 0  PHQ - 2 Score 0 0 0    Patient has failed these meds in past: *** Patient is currently {CHL Controlled/Uncontrolled:[phone]} on  the following medications:  °• Paroxetine 20 mg 1 tablet every morning ° °We discussed:  *** ° °Plan ° °Continue {CHL HP Upstream Pharmacy Plans:2109141003} ° °ED  ° °Patient has failed these meds in past: *** °Patient is currently {CHL Controlled/Uncontrolled:2109141014} on the following medications:  °• Sildenafil 100 mg PRN ° °We discussed:  *** ° °Plan ° °Continue {CHL HP Upstream Pharmacy Plans:2109141003} ° °Constipation  ° °Patient has failed these meds in past: *** °Patient is currently {CHL Controlled/Uncontrolled:2109141014} on the following medications:  °• Metamucil packets - how often? ° °We discussed:  *** ° °Plan ° °Continue {CHL HP Upstream Pharmacy Plans:2109141003}  ° °Vaccines  ° °Reviewed and discussed patient's vaccination history.   ° °Immunization History  °Administered Date(s) Administered  °• Influenza Split 09/05/2011, 11/09/2012  °• Influenza Whole 10/04/2009, 09/24/2010  °• Influenza, High Dose Seasonal PF  07/17/2015, 09/01/2017, 07/15/2018  °• Influenza,inj,Quad PF,6+ Mos 11/24/2013, 11/16/2014  °• PFIZER SARS-COV-2 Vaccination 11/19/2019, 12/10/2019  °• Pneumococcal Conjugate-13 07/17/2015  °• Pneumococcal Polysaccharide-23 10/04/2009  °• Pneumococcal-Unspecified 09/03/2017  °• Td 03/10/2009  °• Tdap 07/18/2018  °• Zoster 09/05/2011  ° shingrix, flu shot ° °Plan ° °Recommended patient receive *** vaccine in *** office.  ° °Medication Management  ° °Pt uses [facility] for all medications °Uses pill box? {Yes or If no, why not?:20788} °Pt endorses ***% compliance ° °We discussed: {Pharmacy options:24294} ° °Plan ° °{US Pharmacy Plan:23885} ° ° ° °Follow up: *** month phone visit ° °*** ° ° ° °

## 2020-07-31 ENCOUNTER — Ambulatory Visit: Payer: Medicare Other

## 2020-08-01 NOTE — Chronic Care Management (AMB) (Signed)
I left the patient a message about his upcoming appointment on 09/27/ 2021 @ 2:00 pm with the clinical pharmacist. He was asked to please have all medication on had to review the pharmacist.

## 2020-08-07 ENCOUNTER — Telehealth: Payer: Self-pay | Admitting: Family Medicine

## 2020-08-07 DIAGNOSIS — I10 Essential (primary) hypertension: Secondary | ICD-10-CM

## 2020-08-07 DIAGNOSIS — E782 Mixed hyperlipidemia: Secondary | ICD-10-CM

## 2020-08-07 NOTE — Telephone Encounter (Signed)
-----   Message from Viona Gilmore, [facility] sent at 07/24/2020 12:14 PM EDT ----- Regarding: CCM referral Good afternoon,  Can you please place a CCM referral for Prestin Munch? He is on my schedule next week.  Thanks for your help!  Best, Maddie  Jeni Salles, PharmD Clinical Pharmacist [place] [place] [facility]

## 2020-08-08 ENCOUNTER — Telehealth: Payer: Self-pay | Admitting: Family Medicine

## 2020-08-08 NOTE — Progress Notes (Signed)
  Chronic Care Management   Outreach Note  08/08/2020 Name: Ricky Meyers MRN: [id number] DOB: Oct 28, 1946  Referred by: [name], MD Reason for referral : No chief complaint on file.   An unsuccessful telephone outreach was attempted today. The patient was referred to the pharmacist for assistance with care management and care coordination.   Follow Up Plan:   Carley Perdue [facility] Scheduler

## 2020-08-09 ENCOUNTER — Telehealth: Payer: Self-pay | Admitting: Family Medicine

## 2020-08-09 NOTE — Progress Notes (Signed)
  Chronic Care Management   Note  08/09/2020 Name: Ricky Meyers MRN: [id number] DOB: July 29, 1946  Ricky Meyers is a 74 y.o. year old male who is a primary care patient of [name], MD. I reached out to Vance Peper by phone today in response to a referral sent by [name]'s PCP, [name], MD.   Ricky Meyers was given information about Chronic Care Management services today including:  1. CCM service includes personalized support from designated clinical staff supervised by his physician, including individualized plan of care and coordination with other care providers 2. 24/7 contact phone numbers for assistance for urgent and routine care needs. 3. Service will only be billed when office clinical staff spend 20 minutes or more in a month to coordinate care. 4. Only one practitioner may furnish and bill the service in a calendar month. 5. The patient may stop CCM services at any time (effective at the end of the month) by phone call to the office staff.   Patient agreed to services and verbal consent obtained.   Follow up plan:   Carley Perdue [facility] Scheduler

## 2020-08-16 ENCOUNTER — Encounter: Payer: Medicare Other | Admitting: Family Medicine

## 2020-08-18 ENCOUNTER — Other Ambulatory Visit: Payer: Self-pay | Admitting: Family Medicine

## 2020-08-18 NOTE — Telephone Encounter (Signed)
He needs a PMV 

## 2020-08-21 NOTE — Telephone Encounter (Signed)
Pt has visit scheduled for tomorrow.

## 2020-08-22 ENCOUNTER — Encounter: Payer: Self-pay | Admitting: Family Medicine

## 2020-08-22 ENCOUNTER — Ambulatory Visit (INDEPENDENT_AMBULATORY_CARE_PROVIDER_SITE_OTHER): Payer: Medicare Other | Admitting: Family Medicine

## 2020-08-22 ENCOUNTER — Other Ambulatory Visit: Payer: Self-pay

## 2020-08-22 VITALS — BP 112/62 | HR 77 | Temp 97.9°F | Ht 73.0 in | Wt 235.8 lb

## 2020-08-22 DIAGNOSIS — E782 Mixed hyperlipidemia: Secondary | ICD-10-CM

## 2020-08-22 DIAGNOSIS — D509 Iron deficiency anemia, unspecified: Secondary | ICD-10-CM

## 2020-08-22 DIAGNOSIS — M159 Polyosteoarthritis, unspecified: Secondary | ICD-10-CM | POA: Diagnosis not present

## 2020-08-22 DIAGNOSIS — I10 Essential (primary) hypertension: Secondary | ICD-10-CM

## 2020-08-22 DIAGNOSIS — Z8546 Personal history of malignant neoplasm of prostate: Secondary | ICD-10-CM

## 2020-08-22 DIAGNOSIS — Z23 Encounter for immunization: Secondary | ICD-10-CM

## 2020-08-22 DIAGNOSIS — I2694 Multiple subsegmental pulmonary emboli without acute cor pulmonale: Secondary | ICD-10-CM | POA: Diagnosis not present

## 2020-08-22 DIAGNOSIS — M109 Gout, unspecified: Secondary | ICD-10-CM

## 2020-08-22 NOTE — Progress Notes (Signed)
Subjective:    Patient ID: Ricky Meyers, male    DOB: April 25, 1946, 74 y.o.   MRN: [id number]  HPI Here to follow up on issues. He is doing well in general, though his osteoarthritis is always a burden. His BP is stable at home. His gout is controlled. He continues on Xarelto daily.    Review of Systems  Constitutional: Negative.   HENT: Negative.   Eyes: Negative.   Respiratory: Negative.   Cardiovascular: Negative.   Gastrointestinal: Negative.   Genitourinary: Negative.   Musculoskeletal: Positive for arthralgias and back pain.  Skin: Negative.   Neurological: Negative.   Psychiatric/Behavioral: Negative.        Objective:   Physical Exam Constitutional:      General: He is not in acute distress.    Appearance: He is well-developed. He is not diaphoretic.  HENT:     Head: Normocephalic and atraumatic.     Right Ear: External ear normal.     Left Ear: External ear normal.     Nose: Nose normal.     Mouth/Throat:     Pharynx: No oropharyngeal exudate.  Eyes:     General: No scleral icterus.       Right eye: No discharge.        Left eye: No discharge.     Conjunctiva/sclera: Conjunctivae normal.     Pupils: Pupils are equal, round, and reactive to light.  Neck:     Thyroid: No thyromegaly.     Vascular: No JVD.     Trachea: No tracheal deviation.  Cardiovascular:     Rate and Rhythm: Normal rate and regular rhythm.     Heart sounds: Normal heart sounds. No murmur heard.  No friction rub. No gallop.   Pulmonary:     Effort: Pulmonary effort is normal. No respiratory distress.     Breath sounds: Normal breath sounds. No wheezing or rales.  Chest:     Chest wall: No tenderness.  Abdominal:     General: Bowel sounds are normal. There is no distension.     Palpations: Abdomen is soft. There is no mass.     Tenderness: There is no abdominal tenderness. There is no guarding or rebound.  Genitourinary:    Penis: Normal. No tenderness.      Testes: Normal.      Rectum: Normal. Guaiac result negative.  Musculoskeletal:        General: No tenderness. Normal range of motion.     Cervical back: Neck supple.  Lymphadenopathy:     Cervical: No cervical adenopathy.  Skin:    General: Skin is warm and dry.     Coloration: Skin is not pale.     Findings: No erythema or rash.  Neurological:     Mental Status: He is alert and oriented to person, place, and time.     Cranial Nerves: No cranial nerve deficit.     Motor: No abnormal muscle tone.     Coordination: Coordination normal.     Deep Tendon Reflexes: Reflexes are normal and symmetric. Reflexes normal.  Psychiatric:        Behavior: Behavior normal.        Thought Content: Thought content normal.        Judgment: Judgment normal.           Assessment & Plan:  His HTN is stable. His OA and gout are stable. We will get fasting labs to check lipids, etc. He is given  a flu shot.  Alysia Penna, MD

## 2020-08-22 NOTE — Progress Notes (Signed)
[id number]

## 2020-08-23 ENCOUNTER — Telehealth (INDEPENDENT_AMBULATORY_CARE_PROVIDER_SITE_OTHER): Payer: Medicare Other | Admitting: Family Medicine

## 2020-08-23 ENCOUNTER — Encounter: Payer: Self-pay | Admitting: Family Medicine

## 2020-08-23 DIAGNOSIS — M159 Polyosteoarthritis, unspecified: Secondary | ICD-10-CM

## 2020-08-23 DIAGNOSIS — F1190 Opioid use, unspecified, uncomplicated: Secondary | ICD-10-CM | POA: Diagnosis not present

## 2020-08-23 LAB — LIPID PANEL
Cholesterol: 180 mg/dL (ref <200)
HDL: 73 mg/dL (ref >=40)
LDL Cholesterol (Calc): 88 mg/dL (calc)
Non-HDL Cholesterol (Calc): 107 mg/dL (calc) (ref <130)
Total CHOL/HDL Ratio: 2.5 (calc) (ref <5.0)
Triglycerides: 96 mg/dL (ref <150)

## 2020-08-23 LAB — BASIC METABOLIC PANEL
BUN/Creatinine Ratio: 23 (calc) — ABNORMAL HIGH (ref 6–22)
BUN: 27 mg/dL — ABNORMAL HIGH (ref 7–25)
CO2: 30 mmol/L (ref 20–32)
Calcium: 10.4 mg/dL — ABNORMAL HIGH (ref 8.6–10.3)
Chloride: 103 mmol/L (ref 98–110)
Creat: 1.2 mg/dL — ABNORMAL HIGH (ref 0.70–1.18)
Glucose, Bld: 106 mg/dL — ABNORMAL HIGH (ref 65–99)
Potassium: 5 mmol/L (ref 3.5–5.3)
Sodium: 139 mmol/L (ref 135–146)

## 2020-08-23 LAB — CBC WITH DIFFERENTIAL/PLATELET
Absolute Monocytes: 592 cells/uL (ref 200–950)
Basophils Absolute: 39 cells/uL (ref 0–200)
Basophils Relative: 0.6 %
Eosinophils Absolute: 150 cells/uL (ref 15–500)
Eosinophils Relative: 2.3 %
HCT: 43 % (ref 38.5–50.0)
Hemoglobin: 14.7 g/dL (ref 13.2–17.1)
Lymphs Abs: 1833 cells/uL (ref 850–3900)
MCH: 32.5 pg (ref 27.0–33.0)
MCHC: 34.2 g/dL (ref 32.0–36.0)
MCV: 95.1 fL (ref 80.0–100.0)
MPV: 10.7 fL (ref 7.5–12.5)
Monocytes Relative: 9.1 %
Neutro Abs: 3887 cells/uL (ref 1500–7800)
Neutrophils Relative %: 59.8 %
Platelets: 203 10*3/uL (ref 140–400)
RBC: 4.52 10*6/uL (ref 4.20–5.80)
RDW: 13 % (ref 11.0–15.0)
Total Lymphocyte: 28.2 %
WBC: 6.5 10*3/uL (ref 3.8–10.8)

## 2020-08-23 LAB — HEPATIC FUNCTION PANEL
AG Ratio: 1.7 (calc) (ref 1.0–2.5)
ALT: 23 U/L (ref 9–46)
AST: 21 U/L (ref 10–35)
Albumin: 4.5 g/dL (ref 3.6–5.1)
Alkaline phosphatase (APISO): 64 U/L (ref 35–144)
Bilirubin, Direct: 0.2 mg/dL (ref 0.0–0.2)
Globulin: 2.6 g/dL (calc) (ref 1.9–3.7)
Indirect Bilirubin: 0.6 mg/dL (calc) (ref 0.2–1.2)
Total Bilirubin: 0.8 mg/dL (ref 0.2–1.2)
Total Protein: 7.1 g/dL (ref 6.1–8.1)

## 2020-08-23 LAB — TSH: TSH: 1.21 mIU/L (ref 0.40–4.50)

## 2020-08-23 LAB — PSA: PSA: 0.04 ng/mL (ref <=4.0)

## 2020-08-23 LAB — URIC ACID: Uric Acid, Serum: 4.9 mg/dL (ref 4.0–8.0)

## 2020-08-23 MED ORDER — OXYCODONE-ACETAMINOPHEN 10-325 MG PO TABS: 1.0000 | ORAL_TABLET | Freq: Four times a day (QID) | ORAL | 0 refills | Status: DC | PRN

## 2020-08-23 NOTE — Addendum Note (Signed)
Addended by: Alysia Penna A on: 08/23/2020 03:13 PM   Modules accepted: Orders

## 2020-08-23 NOTE — Progress Notes (Signed)
   Subjective:    Patient ID: Ricky Meyers, male    DOB: Sep 28, 1946, 74 y.o.   MRN: [id number]  HPI Virtual Visit via Telephone Note  I connected with the patient on 08/23/20 at  3:00 PM EDT by telephone and verified that I am speaking with the correct person using two identifiers.   I discussed the limitations, risks, security and privacy concerns of performing an evaluation and management service by telephone and the availability of in person appointments. I also discussed with the patient that there may be a patient responsible charge related to this service. The patient expressed understanding and agreed to proceed.  Location patient: home Location provider: work or home office Participants present for the call: patient, provider Patient did not have a visit in the prior 7 days to address this/these issue(s).   History of Present Illness: Here for pain management, he is doing well.  Indication for chronic opioid: osteoarthritis Medication and dose: Percocet 10-325 # pills per month: 120 Last UDS date: 11-23-19 Opioid Treatment Agreement signed (Y/N): 01-15-19 Opioid Treatment Agreement last reviewed with patient:  08-23-20 NCCSRS reviewed this encounter (include red flags): Yes    Observations/Objective: Patient sounds cheerful and well on the phone. I do not appreciate any SOB. Speech and thought processing are grossly intact. Patient reported vitals:  Assessment and Plan: Pain management, meds were refilled.  Alysia Penna, MD   Follow Up Instructions:     [phone] 5-10 [phone] 11-20 9443 21-30 I did not refer this patient for an OV in the next 24 hours for this/these issue(s).  I discussed the assessment and treatment plan with the patient. The patient was provided an opportunity to ask questions and all were answered. The patient agreed with the plan and demonstrated an understanding of the instructions.   The patient was advised to call back or seek an in-person  evaluation if the symptoms worsen or if the condition fails to improve as anticipated.  I provided 12 minutes of non-face-to-face time during this encounter.   Alysia Penna, MD    Review of Systems     Objective:   Physical Exam        Assessment & Plan:

## 2020-08-25 ENCOUNTER — Telehealth: Payer: Self-pay | Admitting: Family Medicine

## 2020-08-25 NOTE — Telephone Encounter (Signed)
Patient called, left VM labs are WNL as noted by Dr. Sarajane Jews, see result notes.

## 2020-08-25 NOTE — Telephone Encounter (Signed)
Pt returning your call about his labs and want a call back he stated that he will be playing Golf for the next 4 hr.

## 2020-08-30 ENCOUNTER — Other Ambulatory Visit: Payer: Self-pay | Admitting: Family Medicine

## 2020-09-04 ENCOUNTER — Other Ambulatory Visit: Payer: Self-pay | Admitting: Family Medicine

## 2020-09-15 ENCOUNTER — Other Ambulatory Visit: Payer: Self-pay | Admitting: Family Medicine

## 2020-09-15 NOTE — Telephone Encounter (Signed)
Last VV 08/23/20 Last fill 06/19/20  #180/0

## 2020-09-21 ENCOUNTER — Telehealth: Payer: Self-pay

## 2020-09-21 NOTE — Telephone Encounter (Signed)
pharmacy called in stating that the start date for the medication is actually dated for 31 days and so they are wanting to know will the provider let them fill medication today or tomorrow since it would actually be 31 days instead of 30 days since last Rx filled     Please call and advise

## 2020-09-21 NOTE — Telephone Encounter (Signed)
Verbal given per Dr Sarajane Jews to fill percocet.

## 2020-09-26 ENCOUNTER — Ambulatory Visit: Payer: Medicare Other | Attending: Internal Medicine

## 2020-09-26 DIAGNOSIS — Z23 Encounter for immunization: Secondary | ICD-10-CM

## 2020-09-26 NOTE — Progress Notes (Signed)
   Covid-19 Vaccination Clinic  Name:  Ricky Meyers    MRN: [id number] DOB: [birth year]  09/26/2020  [name] was observed post Covid-19 immunization for 15 minutes without incident. He was provided with Vaccine Information Sheet and instruction to access the V-Safe system.   [name] was instructed to call 911 with any severe reactions post vaccine: Marland Kitchen Difficulty breathing  . Swelling of face and throat  . A fast heartbeat  . A bad rash all over body  . Dizziness and weakness   Immunizations Administered    Name Date Dose VIS Date Route   Pfizer COVID-19 Vaccine 09/26/2020  2:13 PM 0.3 mL 08/23/2020 Intramuscular   Manufacturer: [place]   Lot: [id number]   [place]: [id number]

## 2020-10-03 ENCOUNTER — Ambulatory Visit: Payer: Medicare Other | Admitting: Internal Medicine

## 2020-10-06 ENCOUNTER — Telehealth: Payer: Self-pay | Admitting: Pharmacist

## 2020-10-06 NOTE — Chronic Care Management (AMB) (Signed)
    Chronic Care Management Pharmacy Assistant   Name: SHAKA CARDIN  MRN: [id number] DOB: 08-30-1946  Reason for Encounter: Medication Review/Initial Questions for Pharmacist visit on 10-09-2020  Patient Questions: 1. Have you seen any other providers since your last visit? No 2. Any changes in your medications or health? No 3. Any side effects from any medications? No 4. Do you have any symptoms or problems not managed by your medications? No 5. Any concerns about your health right now? No 6. Has your provider asked that you check blood pressure, blood sugar, or follow a special diet at home? No 7. Do you get any type of exercise regularly?  Marland Kitchen He plays golf   8. Can you think of a goal you would like to reach for your health?  . Maintain health 9. Do you have any problems getting your medications? No 10. Is there anything that you would like to discuss during the appointment? No  The patient was asked to please bring medications, blood pressure/ blood sugar log, and supplements to his appointment     PCP : [name], MD  Allergies:  No Known Allergies  Medications: Outpatient Encounter Medications as of 10/06/2020  Medication Sig  . allopurinol (ZYLOPRIM) 300 MG tablet TAKE 1 TABLET BY MOUTH EVERY DAY  . amLODipine (NORVASC) 5 MG tablet TAKE 1 TABLET BY MOUTH EVERY DAY  . atorvastatin (LIPITOR) 40 MG tablet TAKE 1 TABLET BY MOUTH EVERY DAY  . cyclobenzaprine (FLEXERIL) 10 MG tablet TAKE 1 TABLET BY MOUTH 3 TIMES DAILY AS NEEDED FOR MUSCLE SPASMS  . gabapentin (NEURONTIN) 100 MG capsule TAKE 1 CAPSULE BY MOUTH 2 TIMES DAILY  . naproxen (NAPROSYN) 500 MG tablet TAKE 1 TABLET BY MOUTH TWICE A DAY WITH A MEAL  . [START ON 10/23/2020] oxyCODONE-acetaminophen (PERCOCET) 10-325 MG tablet Take 1 tablet by mouth every 6 (six) hours as needed for pain.  Marland Kitchen PARoxetine (PAXIL) 20 MG tablet TAKE 1 TABLET BY MOUTH EVERY MORNING  . psyllium (METAMUCIL) 58.6 % packet Take 1 packet by  mouth daily as needed (for constipation).   . rivaroxaban (XARELTO) 10 MG TABS tablet Take 1 tablet (10 mg total) by mouth daily.  . sildenafil (VIAGRA) 100 MG tablet Take 1 tablet (100 mg total) by mouth daily as needed for erectile dysfunction.  Marland Kitchen telmisartan-hydrochlorothiazide (MICARDIS HCT) 80-25 MG tablet Take 1 tablet by mouth daily.   No facility-administered encounter medications on file as of 10/06/2020.    Current Diagnosis: Patient Active Problem List   Diagnosis Date Noted  . Pulmonary embolus ([place]) 06/15/2019  . Pulmonary embolism ([place]) 06/15/2019  . Osteoarthritis 10/14/2018  . OSA (obstructive sleep apnea) 12/18/2017  . Obesity (BMI 30.0-34.9) 06/17/2017  . Snoring 06/17/2017  . LBBB (left bundle branch block) 10/22/2016  . DOE (dyspnea on exertion) 10/22/2016  . Ectopic beat, ventricular 10/22/2016  . Anemia, iron deficiency 12/03/2014  . Pseudogout of elbow 12/02/2014  . Arthritis 12/02/2014  . Hyponatremia 12/02/2014  . Constipation 12/02/2014  . Suspected Septic arthritis 12/02/2014  . Depression 03/04/2008  . Hyperlipemia 06/30/2007  . Gout 06/30/2007  . Essential hypertension 06/30/2007  . NECK PAIN 06/30/2007  . PROSTATE CANCER, HX OF 06/30/2007    Goals Addressed   None     Follow-Up:  Pharmacist Review   Maia Breslow, [place] Assistant [phone]

## 2020-10-09 ENCOUNTER — Other Ambulatory Visit: Payer: Self-pay | Admitting: Family Medicine

## 2020-10-09 ENCOUNTER — Telehealth: Payer: Self-pay | Admitting: Family Medicine

## 2020-10-09 ENCOUNTER — Ambulatory Visit: Payer: Medicare Other

## 2020-10-09 NOTE — Progress Notes (Signed)
  Chronic Care Management   Note  10/09/2020 Name: Ricky Meyers MRN: [id number] DOB: 1946/03/16  Ricky Meyers is a 74 y.o. year old male who is a primary care patient of [name], MD. I reached out to Vance Peper by phone today in response to a referral sent by Ricky Meyers's PCP, [name], MD.   Ricky Meyers was given information about Chronic Care Management services today including:  1. CCM service includes personalized support from designated clinical staff supervised by his physician, including individualized plan of care and coordination with other care providers 2. 24/7 contact phone numbers for assistance for urgent and routine care needs. 3. Service will only be billed when office clinical staff spend 20 minutes or more in a month to coordinate care. 4. Only one practitioner may furnish and bill the service in a calendar month. 5. The patient may stop CCM services at any time (effective at the end of the month) by phone call to the office staff.   Patient agreed to services and verbal consent obtained.   Follow up plan:   Ricky Meyers [facility] Scheduler

## 2020-10-09 NOTE — Chronic Care Management (AMB) (Unsigned)
Chronic Care Management Pharmacy  Name: Ricky Meyers  MRN: [id number] DOB: 09-10-1946  Initial Planning Appointment: completed 10/06/20  Initial Questions: 1. Have you seen any other providers since your last visit? n/a 2. Any changes in your medicines or health? No   Chief Complaint/ HPI  Ricky Meyers,  74 y.o. , male presents for their Initial CCM visit with the clinical pharmacist In office.  PCP : [name], MD  Their chronic conditions include: {CHL AMB CHRONIC MEDICAL [phone]}  Office Visits: -08/23/20 Alysia Penna, MD: Patient presented for video visit for pain maangement. Refilled oxycodone-APAP 10-325 mg PRN.  -08/22/20 Alysia Penna, MD: Patient presented for annual exam. Labs were WNL. Patient received influenza vaccine.  -05/19/20 Alysia Penna, MD: Patient presented for video visit for pain maangement. Refilled oxycodone-APAP 10-325 mg PRN.   Consult Visit: -07/03/20 Christinia Gully, MD (pulmonary): Patient presented for PE follow up. Changed Xarelto to 10 mg daily. Switched lisinopril-HCTZ to telmisartan-HCTZ.  -06/14/20 Sarajane Marek (ophthalmology): Patient presented for follow up visit. Unable to access notes.  -04/19/20 Martinique DeMarco (optometry): Patient presented for follow up. Unable to access notes. Medications: Outpatient Encounter Medications as of 10/09/2020  Medication Sig  . allopurinol (ZYLOPRIM) 300 MG tablet TAKE 1 TABLET BY MOUTH EVERY DAY  . amLODipine (NORVASC) 5 MG tablet TAKE 1 TABLET BY MOUTH EVERY DAY  . atorvastatin (LIPITOR) 40 MG tablet TAKE 1 TABLET BY MOUTH EVERY DAY  . cyclobenzaprine (FLEXERIL) 10 MG tablet TAKE 1 TABLET BY MOUTH 3 TIMES DAILY AS NEEDED FOR MUSCLE SPASMS  . gabapentin (NEURONTIN) 100 MG capsule TAKE 1 CAPSULE BY MOUTH 2 TIMES DAILY  . naproxen (NAPROSYN) 500 MG tablet TAKE 1 TABLET BY MOUTH TWICE A DAY WITH A MEAL  . [START ON 10/23/2020] oxyCODONE-acetaminophen (PERCOCET) 10-325 MG tablet Take 1  tablet by mouth every 6 (six) hours as needed for pain.  Marland Kitchen PARoxetine (PAXIL) 20 MG tablet TAKE 1 TABLET BY MOUTH EVERY MORNING  . psyllium (METAMUCIL) 58.6 % packet Take 1 packet by mouth daily as needed (for constipation).   . rivaroxaban (XARELTO) 10 MG TABS tablet Take 1 tablet (10 mg total) by mouth daily.  . sildenafil (VIAGRA) 100 MG tablet Take 1 tablet (100 mg total) by mouth daily as needed for erectile dysfunction.  Marland Kitchen telmisartan-hydrochlorothiazide (MICARDIS HCT) 80-25 MG tablet Take 1 tablet by mouth daily.   No facility-administered encounter medications on file as of 10/09/2020.     Current Diagnosis/Assessment:  Goals Addressed   None     Hypertension   BP goal is:  {CHL HP UPSTREAM Pharmacist BP ranges:[id number]}  Office blood pressures are  BP Readings from Last 3 Encounters:  08/22/20 112/62  07/03/20 110/70  05/19/20 120/60   Patient checks BP at home {CHL HP BP Monitoring [phone]} Patient home BP readings are ranging: ***  Patient has failed these meds in the past: *** Patient is currently {CHL Controlled/Uncontrolled:[phone]} on the following medications:  . Telmisartan-HCTZ 80-25 1 tablet daily . Amlodipine 5 mg 1 tablet daily  We discussed diet and exercise extensively -DASH eating plan recommendations: . Emphasizes vegetables, fruits, and whole-grains . Includes fat-free or low-fat dairy products, fish, poultry, beans, nuts, and vegetable oils . Limits foods that are high in saturated fat. These foods include fatty meats, full-fat dairy products, and tropical oils such as coconut, palm kernel, and palm oils. . Limits sugar-sweetened beverages and sweets . Limiting sodium intake to < 1500 mg/day -  Plan  Continue {CHL HP Upstream Pharmacy Plans:[phone]}   Hyperlipidemia   LDL goal < 100  Last lipids Lab Results  Component Value Date   CHOL 180 08/22/2020   HDL 73 08/22/2020   LDLCALC 88 08/22/2020   LDLDIRECT 161.4  09/29/2012   TRIG 96 08/22/2020   CHOLHDL 2.5 08/22/2020   Hepatic Function Latest Ref Rng & Units 08/22/2020 07/15/2018 03/07/2016  Total Protein 6.1 - 8.1 g/dL 7.1 6.5 7.0  Albumin 3.5 - 5.2 g/dL - 4.1 4.5  AST 10 - 35 U/L '21 21 19  ' ALT 9 - 46 U/L '23 23 17  ' Alk Phosphatase 39 - 117 U/L - 61 68  Total Bilirubin 0.2 - 1.2 mg/dL 0.8 0.7 0.7  Bilirubin, Direct 0.0 - 0.2 mg/dL 0.2 0.1 0.2     The 10-year ASCVD risk score Mikey Bussing DC Jr., et al., 2013) is: 21.3%   Values used to calculate the score:     Age: 77 years     Sex: Male     Is Non-Hispanic African American: No     Diabetic: No     Tobacco smoker: No     Systolic Blood Pressure: 696 mmHg     Is BP treated: Yes     HDL Cholesterol: 73 mg/dL     Total Cholesterol: 180 mg/dL   Patient has failed these meds in past: *** Patient is currently controlled on the following medications:  . Atorvastatin 20 mg 1 tablet daily  We discussed:  diet and exercise extensively  Plan  Continue {CHL HP Upstream Pharmacy Plans:[phone]}    Hx of PE   Patient has failed these meds in past: none Patient is currently controlled on the following medications:  . Xarelto 10 mg 1 tablet daily  We discussed:  Monitoring for signs of bleeding such as unexplained and excessive bleeding from a cut or injury, easy or excessive bruising, blood in urine or stools, and nosebleeds without a known cause  Plan  Continue current medications Cost?  Gout    Lab Results  Component Value Date   LABURIC 4.9 08/22/2020   LABURIC 5.4 07/15/2018   LABURIC 4.8 12/03/2015   Patient is currently controlled on:  . Allopurinol 300 mg 1 tablet daily  We discussed: Avoiding/limiting foods with high purine content: . wild game, such as veal, venison, and duck . red meat . some seafood, including tuna, sardines, anchovies, herring, mussels, codfish, scallops, trout, and haddock . organ meat, such as liver, kidneys, and thymus glands, which are known as  sweetbreads  Avoiding/limiting foods to allow the body to process purines more effectively: . High-fat foods: Fat holds uric acid in the kidneys, so a person should avoid fried foods, full-fat dairy products, rich desserts, and other high-fat items. . Alcohol: Beer and whiskey are high in purines, but some research shows that all alcohol consumption can raise uric acid levels. Alcohol also causes dehydration, which hampers the body's ability to flush out uric acid. . Sweetened beverages: Fructose is an ingredient in many sweetened beverages, including fruit juices and sodas, and consuming too much puts a person at risk for gout.  Plan  Continue {CHL HP Upstream Pharmacy EXBMW:[id number]}  Depression   Depression screen [facility] 2/9 08/22/2020 07/15/2018 03/07/2016  Decreased Interest 0 0 0  Down, Depressed, Hopeless 0 0 0  PHQ - 2 Score 0 0 0  Altered sleeping 0 - -  Tired, decreased energy 0 - -  Change in appetite 0 - -  Feeling  bad or failure about yourself  0 - -  Trouble concentrating 0 - -  Moving slowly or fidgety/restless 0 - -  Suicidal thoughts 0 - -  PHQ-9 Score 0 - -    Patient has failed these meds in past: *** Patient is currently {CHL Controlled/Uncontrolled:[phone]} on the following medications:  . Paroxetine 20 mg 1 tablet every morning  We discussed:  ***  Plan  Continue {CHL HP Upstream Pharmacy CWUGQ:[id number]}   Arthritis/pain/nerve pain   Patient has failed these meds in past: *** Patient is currently {CHL Controlled/Uncontrolled:[phone]} on the following medications:  . Oxycodone-APAP 10-325 mg 1 tablet PRN . Naproxen 500 mg 1 tablet twice daily with meals . Gabapentin 100 mg 1 capsule twice daily . Cyclobenzaprine 10 mg 1 table three times daily as needed  We discussed:  ***  Plan  Continue {CHL HP Upstream Pharmacy EKCMK:[id number]}   ED   Patient has failed these meds in past: *** Patient is currently {CHL  Controlled/Uncontrolled:[phone]} on the following medications:  . Viagra 100 mg 1 tablet PRN  We discussed:  ***  Plan  Continue {CHL HP Upstream Pharmacy WPVXY:[id number]}  Vaccines   Reviewed and discussed patient's vaccination history.    Immunization History  Administered Date(s) Administered  . Fluad Quad(high Dose 65+) 08/22/2020  . Influenza Split 09/05/2011, 11/09/2012  . Influenza Whole 10/04/2009, 09/24/2010  . Influenza, High Dose Seasonal PF 07/17/2015, 09/01/2017, 07/15/2018  . Influenza,inj,Quad PF,6+ Mos 11/24/2013, 11/16/2014  . PFIZER SARS-COV-2 Vaccination 11/19/2019, 12/10/2019, 09/26/2020  . Pneumococcal Conjugate-13 07/17/2015  . Pneumococcal Polysaccharide-23 10/04/2009  . Pneumococcal-Unspecified 09/03/2017  . Td 03/10/2009  . Tdap 07/18/2018  . Zoster 09/05/2011   shingrix?  Plan  Recommended patient receive *** vaccine in *** office.   Medication Management   Patient's preferred pharmacy is:  [place], [place] - [address] [place] [address] Phone: [phone] Fax: [phone]  [place], [place] - [address] [place] [address] Phone: [phone] Fax: [phone]  Uses pill box? {Yes or If no, why not?:20788} Pt endorses ***% compliance  We discussed: {Pharmacy options:24294}  Plan  {[facility] Pharmacy SRPR:94585}  Part D?  Follow up: *** month phone visit  Jeni Salles, PharmD [facility] [phone]

## 2020-10-17 ENCOUNTER — Telehealth: Payer: Self-pay | Admitting: Pharmacist

## 2020-10-18 NOTE — Chronic Care Management (AMB) (Signed)
    Chronic Care Management Pharmacy Assistant   Name: PANCHO RUSHING  MRN: [id number] DOB: January 15, 1946  Reason for Encounter: Medication Review/Initial Questions for Pharmacist visit on 10-19-2020  Patient Questions: 1. Have you seen any other providers since your last visit? No 2. Any changes in your medications or health? No 3. Any side effects from any medications? No 4. Do you have any symptoms or problems not managed by your medications? No 5. Any concerns about your health right now? No 6. Has your provider asked that you check blood pressure, blood sugar, or follow a special diet at home? No 7. Do you get any type of exercise regularly?  No 8. Can you think of a goal you would like to reach for your health?  . He would like to be taking fewer medications 9. Do you have any problems getting your medications? no 10. Is there anything that you would like to discuss during the appointment?   The patient was asked to please bring medications, blood pressure/ blood sugar log, and supplements to his appointment.   PCP : [name], MD  Allergies:  No Known Allergies  Medications: Outpatient Encounter Medications as of 10/17/2020  Medication Sig  . allopurinol (ZYLOPRIM) 300 MG tablet TAKE 1 TABLET BY MOUTH EVERY DAY  . amLODipine (NORVASC) 5 MG tablet TAKE 1 TABLET BY MOUTH EVERY DAY  . atorvastatin (LIPITOR) 40 MG tablet TAKE 1 TABLET BY MOUTH EVERY DAY  . cyclobenzaprine (FLEXERIL) 10 MG tablet TAKE 1 TABLET BY MOUTH 3 TIMES DAILY AS NEEDED FOR MUSCLE SPASMS  . gabapentin (NEURONTIN) 100 MG capsule TAKE 1 CAPSULE BY MOUTH 2 TIMES DAILY  . naproxen (NAPROSYN) 500 MG tablet TAKE 1 TABLET BY MOUTH TWICE A DAY WITH A MEAL  . [START ON 10/23/2020] oxyCODONE-acetaminophen (PERCOCET) 10-325 MG tablet Take 1 tablet by mouth every 6 (six) hours as needed for pain.  Marland Kitchen PARoxetine (PAXIL) 20 MG tablet TAKE 1 TABLET BY MOUTH EVERY MORNING  . psyllium (METAMUCIL) 58.6 % packet Take 1  packet by mouth daily as needed (for constipation).   . rivaroxaban (XARELTO) 10 MG TABS tablet Take 1 tablet (10 mg total) by mouth daily.  . sildenafil (VIAGRA) 100 MG tablet Take 1 tablet (100 mg total) by mouth daily as needed for erectile dysfunction.  Marland Kitchen telmisartan-hydrochlorothiazide (MICARDIS HCT) 80-25 MG tablet Take 1 tablet by mouth daily.   No facility-administered encounter medications on file as of 10/17/2020.    Current Diagnosis: Patient Active Problem List   Diagnosis Date Noted  . Pulmonary embolus ([place]) 06/15/2019  . Pulmonary embolism ([place]) 06/15/2019  . Osteoarthritis 10/14/2018  . OSA (obstructive sleep apnea) 12/18/2017  . Obesity (BMI 30.0-34.9) 06/17/2017  . Snoring 06/17/2017  . LBBB (left bundle branch block) 10/22/2016  . DOE (dyspnea on exertion) 10/22/2016  . Ectopic beat, ventricular 10/22/2016  . Anemia, iron deficiency 12/03/2014  . Pseudogout of elbow 12/02/2014  . Arthritis 12/02/2014  . Hyponatremia 12/02/2014  . Constipation 12/02/2014  . Suspected Septic arthritis 12/02/2014  . Depression 03/04/2008  . Hyperlipemia 06/30/2007  . Gout 06/30/2007  . Essential hypertension 06/30/2007  . NECK PAIN 06/30/2007  . PROSTATE CANCER, HX OF 06/30/2007    Goals Addressed   None     Follow-Up:  Pharmacist Review   Maia Breslow, [place] Assistant [phone]

## 2020-10-19 ENCOUNTER — Ambulatory Visit: Payer: Medicare Other | Admitting: Pharmacist

## 2020-10-19 ENCOUNTER — Telehealth: Payer: Self-pay | Admitting: Family Medicine

## 2020-10-19 ENCOUNTER — Telehealth: Payer: Self-pay | Admitting: *Deleted

## 2020-10-19 ENCOUNTER — Telehealth: Payer: Self-pay

## 2020-10-19 ENCOUNTER — Other Ambulatory Visit: Payer: Self-pay

## 2020-10-19 DIAGNOSIS — I10 Essential (primary) hypertension: Secondary | ICD-10-CM

## 2020-10-19 DIAGNOSIS — M109 Gout, unspecified: Secondary | ICD-10-CM

## 2020-10-19 NOTE — Telephone Encounter (Signed)
New message    Patient drop off form renewal of disability parking placard to be completed by Physician

## 2020-10-19 NOTE — Chronic Care Management (AMB) (Signed)
Chronic Care Management Pharmacy  Name: Ricky Meyers  MRN: [id number] DOB: [birth year]  Initial Planning Appointment: completed 10/06/20  Initial Questions: 1. Have you seen any other providers since your last visit? n/a 2. Any changes in your medicines or health? No   Chief Complaint/ HPI  Ricky Meyers,  74 y.o. , male presents for their Initial CCM visit with the clinical pharmacist In office.  PCP : [name], MD  Their chronic conditions include: HTN, HLD, Hx of PE, gout, depression, pain, ED  Office Visits: -08/23/20 Alysia Penna, MD: Patient presented for video visit for pain maangement. Refilled oxycodone-APAP 10-325 mg PRN.  -08/22/20 Alysia Penna, MD: Patient presented for annual exam. Labs were WNL. Patient received influenza vaccine.  -05/19/20 Alysia Penna, MD: Patient presented for video visit for pain maangement. Refilled oxycodone-APAP 10-325 mg PRN.  Consult Visit: -07/03/20 Christinia Gully, MD (pulmonary): Patient presented for PE follow up. Changed Xarelto to 10 mg daily. Switched lisinopril-HCTZ to telmisartan-HCTZ.  -06/14/20 Sarajane Marek (ophthalmology): Patient presented for follow up visit. Unable to access notes.  -04/19/20 Martinique DeMarco (optometry): Patient presented for follow up. Unable to access notes. Medications: Outpatient Encounter Medications as of 10/19/2020  Medication Sig  . allopurinol (ZYLOPRIM) 300 MG tablet TAKE 1 TABLET BY MOUTH EVERY DAY  . amLODipine (NORVASC) 5 MG tablet TAKE 1 TABLET BY MOUTH EVERY DAY  . atorvastatin (LIPITOR) 40 MG tablet TAKE 1 TABLET BY MOUTH EVERY DAY  . cyclobenzaprine (FLEXERIL) 10 MG tablet TAKE 1 TABLET BY MOUTH 3 TIMES DAILY AS NEEDED FOR MUSCLE SPASMS  . gabapentin (NEURONTIN) 100 MG capsule TAKE 1 CAPSULE BY MOUTH 2 TIMES DAILY  . oxyCODONE-acetaminophen (PERCOCET) 10-325 MG tablet Take 1 tablet by mouth every 6 (six) hours as needed for pain.  Marland Kitchen PARoxetine (PAXIL) 20 MG tablet TAKE 1 TABLET BY  MOUTH EVERY MORNING  . psyllium (METAMUCIL) 58.6 % packet Take 1 packet by mouth daily as needed (for constipation).   . rivaroxaban (XARELTO) 10 MG TABS tablet Take 1 tablet (10 mg total) by mouth daily.  Marland Kitchen telmisartan-hydrochlorothiazide (MICARDIS HCT) 80-25 MG tablet Take 1 tablet by mouth daily.  . naproxen (NAPROSYN) 500 MG tablet TAKE 1 TABLET BY MOUTH TWICE A DAY WITH A MEAL (Patient not taking: Reported on 10/19/2020)  . sildenafil (VIAGRA) 100 MG tablet Take 1 tablet (100 mg total) by mouth daily as needed for erectile dysfunction. (Patient not taking: Reported on 10/19/2020)   No facility-administered encounter medications on file as of 10/19/2020.   Patient reports he was going to the gym and lost 35-40 lbs pre-COVID and since has not been as active. He is also limited in activity due to spinal stenosis and he reports he needs to buy a recumbent bike to increase activity.  He is retired and spends a lot of time watching the news. He says he is not a complete couch potato. He lives with his "wife" but they forgot to get married and have been together for a long time. She keeps him active and is 42 years younger than him.   He mostly eats at home and his wife usually prepares the food. They usually eat meat, salad and veggie or baked potato. For meat, they mostly eat chicken and pork chops, some steaks, and seafood on occasion. He is not a big dessert eater. For drinks, he usually has 1 bottle of water a day and admits to drinking "too much beer". He sometimes has 3-4 drinks/day and  sometimes none.   He reports he is a really good sleeper but sometimes doesn't feel like he cannot get into deep sleep for the last 1-2 weeks. He does get up about 2-3 times a night to go to the bathroom but does not wet the bed. Discussed  practicing good sleep hygiene by setting a sleep schedule and maintaining it, avoid excessive napping, following a nightly routine, avoiding screen time for 30-60 minutes  before going to bed, and making the bedroom a cool, quiet and dark space. He reports he may be going to bed too early but hasn't taken naps during the day. He tries to read before bed and is taking melatonin 10 mg which is helping.   Patient reports his medicines are working but he has been in more pain lately because he has been more active. He has traveled to [place] and to [place] recently.  Current Diagnosis/Assessment:  Goals Addressed            This Visit's Progress   . Pharmacy Care Plan       CARE PLAN ENTRY (see longitudinal plan of care for additional care plan information)  Current Barriers:  . Chronic Disease Management support, education, and care coordination needs related to Hypertension, Hyperlipidemia, Depression, Gout, and history of PE and pain   Hypertension BP Readings from Last 3 Encounters:  08/22/20 112/62  07/03/20 110/70  05/19/20 120/60   . Pharmacist Clinical Goal(s): o Over the next 120 days, patient will work with PharmD and providers to maintain BP goal <140/90 . Current regimen:  . Telmisartan-HCTZ 80-25 1 tablet daily  . Amlodipine 5 mg 1 tablet daily . Interventions: o Discussed the importance of checking blood pressure at home o DASH eating plan recommendations: . Emphasizes vegetables, fruits, and whole-grains . Includes fat-free or low-fat dairy products, fish, poultry, beans, nuts, and vegetable oils . Limits foods that are high in saturated fat. These foods include fatty meats, full-fat dairy products, and tropical oils such as coconut, palm kernel, and palm oils. . Limits sugar-sweetened beverages and sweets . Limiting sodium intake to < 1500 mg/day . Patient self care activities - Over the next 120 days, patient will: o Check blood pressure weekly, document, and provide at future appointments o Ensure daily salt intake < 2300 mg/day  Hyperlipidemia Lab Results  Component Value Date/Time   LDLCALC 88 08/22/2020 12:08 PM    LDLDIRECT 161.4 09/29/2012 08:11 AM   . Pharmacist Clinical Goal(s): o Over the next 120 days, patient will work with PharmD and providers to maintain LDL goal < 100 . Current regimen:  o Atorvastatin 20 mg 1 tablet daily . Interventions: o Discussed lowering cholesterol through diet by: . Limiting foods with cholesterol such as liver and other organ meats, egg yolks, shrimp, and whole milk dairy products . Avoiding saturated fats and trans fats and incorporating healthier fats, such as lean meat, nuts, and unsaturated oils like canola and olive oils . Eating foods with soluble fiber such as whole-grain cereals such as oatmeal and oat bran, fruits such as apples, bananas, oranges, pears, and prunes, legumes such as kidney beans, lentils, chick peas, black-eyed peas, and lima beans, and green leafy vegetables . Limiting alcohol intake . Patient self care activities - Over the next 120 days, patient will: o Continue current medication  History of PE . Pharmacist Clinical Goal(s): o Over the next 120 days, patient will work with PharmD and providers to prevent blood cots . Current regimen:    o Xarelto 10 mg 1 tablet daily . Interventions: o Discussed monitoring for signs of bleeding such as unexplained and excessive bleeding from a cut or injury, easy or excessive bruising, blood in urine or stools, and nosebleeds without a known cause o Discussed signs of a DVT (blood clot in the leg) such as one leg swelling, warm to the touch, and redness . Patient self care activities - Over the next 120 days, patient will: o Continue current medication  Arthritis/pain . Pharmacist Clinical Goal(s) o Over the next 120 days, patient will work with PharmD and providers to manage symptoms of pain . Current regimen:  . Oxycodone-APAP 10-325 mg 1 tablet as needed . Naproxen 500 mg 1 tablet twice daily with meals as needed . Gabapentin 100 mg 1 capsule twice daily  . Cyclobenzaprine 10 mg 1 table three  times daily as needed  . Interventions: o Discussed using Oxycodone as sparingly as possible . Patient self care activities - Over the next 120 days, patient will: o Continue current medications  Depression . Pharmacist Clinical Goal(s) o Over the next 120 days, patient will work with PharmD and providers to manage symptoms of depression . Current regimen:  o Paroxetine 20 mg 1 tablet every morning . Interventions: o Discussed that this type of medicine isn't always needed long term  . Patient self care activities - Over the next 120 days, patient will: o Discuss with Dr. Sarajane Jews about the need for this medication or stopping it  Gout Lab Results  Component Value Date   LABURIC 4.9 08/22/2020   . Pharmacist Clinical Goal(s) o Over the next 120 days, patient will work with PharmD and providers to maintain uric acid < 6 and prevent gout flare ups . Current regimen:  o Allopurinol 300 mg 1 tablet daily . Interventions: o Discussed avoiding/limiting foods with high purine content: . wild game, such as veal, venison, and duck . red meat . some seafood, including tuna, sardines, anchovies, herring, mussels, codfish, scallops, trout, and haddock . organ meat, such as liver, kidneys, and thymus glands, which are known as sweetbreads o Avoiding/limiting foods to allow the body to process purines more effectively: . High-fat foods: Fat holds uric acid in the kidneys, so a person should avoid fried foods, full-fat dairy products, rich desserts, and other high-fat items. . Alcohol: Beer and whiskey are high in purines, but some research shows that all alcohol consumption can raise uric acid levels. Alcohol also causes dehydration, which hampers the body's ability to flush out uric acid. . Sweetened beverages: Fructose is an ingredient in many sweetened beverages, including fruit juices and sodas, and consuming too much puts a person at risk for gout. . Patient self care activities - Over the next  120 days, patient will: o Continue current medication  Medication management . Pharmacist Clinical Goal(s): o Over the next 120 days, patient will work with PharmD and providers to maintain optimal medication adherence . Current pharmacy: [facility] . Interventions o Comprehensive medication review performed. o Continue current medication management strategy . Patient self care activities - Over the next 120 days, patient will: o Take medications as prescribed o Report any questions or concerns to PharmD and/or provider(s)  Initial goal documentation       SDOH Interventions   Flowsheet Row Most Recent Value  SDOH Interventions   Financial Strain Interventions Intervention Not Indicated  Transportation Interventions Intervention Not Indicated      Hypertension   BP goal is:  <140/90  Office blood pressures are  BP Readings from Last 3 Encounters:  08/22/20 112/62  07/03/20 110/70  05/19/20 120/60   Patient checks BP at home never Patient home BP readings are ranging: n/a  Patient has failed these meds in the past: lisinopril (cough) Patient is currently controlled on the following medications:  . Telmisartan-HCTZ 80-25 1 tablet daily - in AM . Amlodipine 5 mg 1 tablet daily - in AM  We discussed diet and exercise extensively -DASH eating plan recommendations: . Emphasizes vegetables, fruits, and whole-grains . Includes fat-free or low-fat dairy products, fish, poultry, beans, nuts, and vegetable oils . Limits foods that are high in saturated fat. These foods include fatty meats, full-fat dairy products, and tropical oils such as coconut, palm kernel, and palm oils. . Limits sugar-sweetened beverages and sweets . Limiting sodium intake to < 1500 mg/day -Was having dizziness with lisinopril and telmisartan but noticed his mistake and stopped lisinopril -Exercising: Discussed recommendations for moderate aerobic exercise for 150 minutes/week spread out over 5  days for heart healthy lifestyle -Discussed the importance of checking blood pressure at home    Plan  Continue current medications   Hyperlipidemia   LDL goal < 100  Last lipids Lab Results  Component Value Date   CHOL 180 08/22/2020   HDL 73 08/22/2020   LDLCALC 88 08/22/2020   LDLDIRECT 161.4 09/29/2012   TRIG 96 08/22/2020   CHOLHDL 2.5 08/22/2020   Hepatic Function Latest Ref Rng & Units 08/22/2020 07/15/2018 03/07/2016  Total Protein 6.1 - 8.1 g/dL 7.1 6.5 7.0  Albumin 3.5 - 5.2 g/dL - 4.1 4.5  AST 10 - 35 U/L 21 21 19  ALT 9 - 46 U/L 23 23 17  Alk Phosphatase 39 - 117 U/L - 61 68  Total Bilirubin 0.2 - 1.2 mg/dL 0.8 0.7 0.7  Bilirubin, Direct 0.0 - 0.2 mg/dL 0.2 0.1 0.2     The 10-year ASCVD risk score (Goff DC Jr., et al., 2013) is: 21.3%   Values used to calculate the score:     Age: 74 years     Sex: Male     Is Non-Hispanic African American: No     Diabetic: No     Tobacco smoker: No     Systolic Blood Pressure: 112 mmHg     Is BP treated: Yes     HDL Cholesterol: 73 mg/dL     Total Cholesterol: 180 mg/dL   Patient has failed these meds in past: none Patient is currently controlled on the following medications:  . Atorvastatin 20 mg 1 tablet daily  We discussed:  diet and exercise extensively  -Statin benefits on lowering LDL  Plan  Continue current medications    Hx of PE   Patient has failed these meds in past: none Patient is currently controlled on the following medications:  . Xarelto 10 mg 1 tablet daily  We discussed:  Monitoring for signs of bleeding such as unexplained and excessive bleeding from a cut or injury, easy or excessive bruising, blood in urine or stools, and nosebleeds without a known cause  Plan  Continue current medications Patient receives medication for free as he does not have Part D insurance.  Gout   Uric acid goal < 6  Lab Results  Component Value Date   LABURIC 4.9 08/22/2020   LABURIC 5.4 07/15/2018    LABURIC 4.8 12/03/2015  Have not have gout in 1.5 years  Patient is currently controlled on:  . Allopurinol 300   mg 1 tablet daily  We discussed: Avoiding/limiting foods with high purine content: . wild game, such as veal, venison, and duck . red meat . some seafood, including tuna, sardines, anchovies, herring, mussels, codfish, scallops, trout, and haddock . organ meat, such as liver, kidneys, and thymus glands, which are known as sweetbreads  Avoiding/limiting foods to allow the body to process purines more effectively: . High-fat foods: Fat holds uric acid in the kidneys, so a person should avoid fried foods, full-fat dairy products, rich desserts, and other high-fat items. . Alcohol: Beer and whiskey are high in purines, but some research shows that all alcohol consumption can raise uric acid levels. Alcohol also causes dehydration, which hampers the body's ability to flush out uric acid. . Sweetened beverages: Fructose is an ingredient in many sweetened beverages, including fruit juices and sodas, and consuming too much puts a person at risk for gout.  Plan  Continue current medications  Depression   Depression screen PHQ 2/9 08/22/2020 07/15/2018 03/07/2016  Decreased Interest 0 0 0  Down, Depressed, Hopeless 0 0 0  PHQ - 2 Score 0 0 0  Altered sleeping 0 - -  Tired, decreased energy 0 - -  Change in appetite 0 - -  Feeling bad or failure about yourself  0 - -  Trouble concentrating 0 - -  Moving slowly or fidgety/restless 0 - -  Suicidal thoughts 0 - -  PHQ-9 Score 0 - -    Patient has failed these meds in past: none Patient is currently controlled on the following medications:  . Paroxetine 20 mg 1 tablet every morning  We discussed: patient was unsure what he was talking this for; discussed that SSRIs are not always necessary long term  Plan Recommended to discuss with PCP about the need for this medication. Continue current medications   Arthritis/pain/nerve pain    Patient has failed these meds in past: none Patient is currently uncontrolled on the following medications:  . Oxycodone-APAP 10-325 mg 1 tablet PRN - taking 2 a day . Naproxen 500 mg 1 tablet twice daily with meals PRN  . Gabapentin 100 mg 1 capsule twice daily  . Cyclobenzaprine 10 mg 1 table three times daily as needed - taking 2 times day  We discussed:  Patient is unsure if gabapentin in helping as he has not noticed a change in nerve pain  Plan  Continue current medications   ED   Patient has failed these meds in past: none Patient is currently controlled on the following medications:  . Viagra 100 mg 1 tablet PRN  Plan  Continue current medications   Miscellaneous   Patient has failed these meds in past: none Patient is currently controlled on the following medications:  . Metamucil every othery day   We discussed:  Increasing fiber intake and fluid intake  Plan  Continue current medications  Vaccines   Reviewed and discussed patient's vaccination history.    Immunization History  Administered Date(s) Administered  . Fluad Quad(high Dose 65+) 08/22/2020  . Influenza Split 09/05/2011, 11/09/2012  . Influenza Whole 10/04/2009, 09/24/2010  . Influenza, High Dose Seasonal PF 07/17/2015, 09/01/2017, 07/15/2018  . Influenza,inj,Quad PF,6+ Mos 11/24/2013, 11/16/2014  . PFIZER SARS-COV-2 Vaccination 11/19/2019, 12/10/2019, 09/26/2020  . Pneumococcal Conjugate-13 07/17/2015  . Pneumococcal Polysaccharide-23 10/04/2009  . Pneumococcal-Unspecified 09/03/2017  . Td 03/10/2009  . Tdap 07/18/2018  . Zoster 09/05/2011   Filled out application for patient assistance for Shingrix.  Plan  Recommended patient receive Shingrix   vaccine at pharmacy.   Medication Management   Patient's preferred pharmacy is:  [facility] - [place], [place] - [address] [place] [address] Phone: [phone] Fax: [phone]  [facility] - [place], [place] - [address] [place] [address] Phone: [phone] Fax: [phone]  Uses pill box? Yes - one for night time and one for morning time Pt endorses 99% compliance - once a month  We discussed: Current pharmacy is preferred with insurance plan and patient is satisfied with pharmacy services  Plan  Continue current medication management strategy   Follow up: 4 month office visit  Madeline Pryor, PharmD BCACP Clinical Pharmacist [facility] [phone] 

## 2020-10-19 NOTE — Telephone Encounter (Signed)
FYI  [3:21 PM] Jeni Salles Hi! I have a really strange request and I wanted to know if you think Dr. Jerilee Hoh would be willing to do this: Mr Ricky Meyers, one of Dr Barbie Banner patients is on Percocet and is running out tomorrow. He cannot fill it with the pharmacy because the next prescription is written as a do not fill until 12/20. The reason he is out is because they let him fill last month on 11/18 (he showed me the bottle) so he technically should run out on Saturday for a 30 days supply. I guess all the pharmacy needs is a go ahead for filling the prescription (they dont need a new rx), which I am not sure how that works given Dr. Sarajane Jews wrote it. But Dr. Sarajane Jews is out until Tuesday and I didn't know who else to ask  Okay by Dr Jerilee Hoh  Refill called in.

## 2020-10-19 NOTE — Telephone Encounter (Signed)
[facility] Applebaum is calling and is calling and stated that they received a refill for oxyCODONE-acetaminophen (PERCOCET but it says do not fill until 12/20 and patient is going to be out on 12/18, please advise. CB is [phone]

## 2020-10-20 ENCOUNTER — Telehealth: Payer: Self-pay | Admitting: Family Medicine

## 2020-10-20 NOTE — Telephone Encounter (Signed)
Left message for patient to call back and schedule Medicare Annual Wellness Visit (AWV) either virtually or in office.   Last AWV no information please schedule at anytime with [facility] Nurse Health Advisor 1 or 2   This should be a 45 minute visit. 

## 2020-10-20 NOTE — Telephone Encounter (Signed)
This has been taken care of by Dr. Jerilee Hoh.

## 2020-10-31 ENCOUNTER — Other Ambulatory Visit: Payer: Self-pay | Admitting: Family Medicine

## 2020-11-02 NOTE — Patient Instructions (Addendum)
Hi Delaine,  It was so lovely to be able to meet you in person! I will reach out to you once I hear back about the Shingles vaccine. In the meantime, please start checking your blood pressure at home to make sure your medications are working properly. Below is a summary of some of the other topics we discussed!  Please give me a call if you have any questions or need anything from me before our follow up!  Best, Maddie  Jeni Salles, PharmD [facility] Clinical Pharmacist [place] [facility] [place]   Visit Information  Goals Addressed            This Visit's Progress   . Pharmacy Care Plan       CARE PLAN ENTRY (see longitudinal plan of care for additional care plan information)  Current Barriers:  . Chronic Disease Management support, education, and care coordination needs related to Hypertension, Hyperlipidemia, Depression, Gout, and history of PE and pain   Hypertension BP Readings from Last 3 Encounters:  08/22/20 112/62  07/03/20 110/70  05/19/20 120/60   . Pharmacist Clinical Goal(s): o Over the next 120 days, patient will work with PharmD and providers to maintain BP goal <140/90 . Current regimen:  . Telmisartan-HCTZ 80-25 1 tablet daily  . Amlodipine 5 mg 1 tablet daily . Interventions: o Discussed the importance of checking blood pressure at home o DASH eating plan recommendations: . Emphasizes vegetables, fruits, and whole-grains . Includes fat-free or low-fat dairy products, fish, poultry, beans, nuts, and vegetable oils . Limits foods that are high in saturated fat. These foods include fatty meats, full-fat dairy products, and tropical oils such as coconut, palm kernel, and palm oils. . Limits sugar-sweetened beverages and sweets . Limiting sodium intake to < 1500 mg/day . Patient self care activities - Over the next 120 days, patient will: o Check blood pressure weekly, document, and provide at future appointments o Ensure daily salt  intake < 2300 mg/day  Hyperlipidemia Lab Results  Component Value Date/Time   LDLCALC 88 08/22/2020 12:08 PM   LDLDIRECT 161.4 09/29/2012 08:11 AM   . Pharmacist Clinical Goal(s): o Over the next 120 days, patient will work with PharmD and providers to maintain LDL goal < 100 . Current regimen:  o Atorvastatin 20 mg 1 tablet daily . Interventions: o Discussed lowering cholesterol through diet by: Marland Kitchen Limiting foods with cholesterol such as liver and other organ meats, egg yolks, shrimp, and whole milk dairy products . Avoiding saturated fats and trans fats and incorporating healthier fats, such as lean meat, nuts, and unsaturated oils like canola and olive oils . Eating foods with soluble fiber such as whole-grain cereals such as oatmeal and oat bran, fruits such as apples, bananas, oranges, pears, and prunes, legumes such as kidney beans, lentils, chick peas, black-eyed peas, and lima beans, and green leafy vegetables . Limiting alcohol intake . Patient self care activities - Over the next 120 days, patient will: o Continue current medication  History of PE . Pharmacist Clinical Goal(s): o Over the next 120 days, patient will work with PharmD and providers to prevent blood cots . Current regimen:  o Xarelto 10 mg 1 tablet daily . Interventions: o Discussed monitoring for signs of bleeding such as unexplained and excessive bleeding from a cut or injury, easy or excessive bruising, blood in urine or stools, and nosebleeds without a known cause o Discussed signs of a DVT (blood clot in the leg) such as one leg swelling,  warm to the touch, and redness . Patient self care activities - Over the next 120 days, patient will: o Continue current medication  Arthritis/pain . Pharmacist Clinical Goal(s) o Over the next 120 days, patient will work with PharmD and providers to manage symptoms of pain . Current regimen:  . Oxycodone-APAP 10-325 mg 1 tablet as needed . Naproxen 500 mg 1 tablet  twice daily with meals as needed . Gabapentin 100 mg 1 capsule twice daily  . Cyclobenzaprine 10 mg 1 table three times daily as needed  . Interventions: o Discussed using Oxycodone as sparingly as possible . Patient self care activities - Over the next 120 days, patient will: o Continue current medications  Depression . Pharmacist Clinical Goal(s) o Over the next 120 days, patient will work with PharmD and providers to manage symptoms of depression . Current regimen:  o Paroxetine 20 mg 1 tablet every morning . Interventions: o Discussed that this type of medicine isn't always needed long term  . Patient self care activities - Over the next 120 days, patient will: o Discuss with Dr. Clent Ridges about the need for this medication or stopping it  Gout Lab Results  Component Value Date   LABURIC 4.9 08/22/2020   . Pharmacist Clinical Goal(s) o Over the next 120 days, patient will work with PharmD and providers to maintain uric acid < 6 and prevent gout flare ups . Current regimen:  o Allopurinol 300 mg 1 tablet daily . Interventions: o Discussed avoiding/limiting foods with high purine content: . wild game, such as veal, venison, and duck . red meat . some seafood, including tuna, sardines, anchovies, herring, mussels, codfish, scallops, trout, and haddock . organ meat, such as liver, kidneys, and thymus glands, which are known as sweetbreads o Avoiding/limiting foods to allow the body to process purines more effectively: . High-fat foods: Fat holds uric acid in the kidneys, so a person should avoid fried foods, full-fat dairy products, rich desserts, and other high-fat items. . Alcohol: Beer and whiskey are high in purines, but some research shows that all alcohol consumption can raise uric acid levels. Alcohol also causes dehydration, which hampers the body's ability to flush out uric acid. . Sweetened beverages: Fructose is an ingredient in many sweetened beverages, including fruit  juices and sodas, and consuming too much puts a person at risk for gout. . Patient self care activities - Over the next 120 days, patient will: o Continue current medication  Medication management . Pharmacist Clinical Goal(s): o Over the next 120 days, patient will work with PharmD and providers to maintain optimal medication adherence . Current pharmacy: [facility] . Interventions o Comprehensive medication review performed. o Continue current medication management strategy . Patient self care activities - Over the next 120 days, patient will: o Take medications as prescribed o Report any questions or concerns to PharmD and/or provider(s)  Initial goal documentation        Mr. Tallerico was given information about Chronic Care Management services today including:  1. CCM service includes personalized support from designated clinical staff supervised by his physician, including individualized plan of care and coordination with other care providers 2. 24/7 contact phone numbers for assistance for urgent and routine care needs. 3. Standard insurance, coinsurance, copays and deductibles apply for chronic care management only during months in which we provide at least 20 minutes of these services. Most insurances cover these services at 100%, however patients may be responsible for any copay, coinsurance and/or deductible if  applicable. This service may help you avoid the need for more expensive face-to-face services. 4. Only one practitioner may furnish and bill the service in a calendar month. 5. The patient may stop CCM services at any time (effective at the end of the month) by phone call to the office staff.  Patient agreed to services and verbal consent obtained.   The patient verbalized understanding of instructions, educational materials, and care plan provided today and agreed to receive a mailed copy of patient instructions, educational materials, and care plan.  Telephone  follow up appointment with pharmacy team member scheduled for:4 months  Verner Chol, [facility]  How to Take Your Blood Pressure You can take your blood pressure at home with a machine. You may need to check your blood pressure at home:  To check if you have high blood pressure (hypertension).  To check your blood pressure over time.  To make sure your blood pressure medicine is working. Supplies needed: You will need a blood pressure machine, or monitor. You can buy one at a drugstore or online. When choosing one:  Choose one with an arm cuff.  Choose one that wraps around your upper arm. Only one finger should fit between your arm and the cuff.  Do not choose one that measures your blood pressure from your wrist or finger. Your doctor can suggest a monitor. How to prepare Avoid these things for 30 minutes before checking your blood pressure:  Drinking caffeine.  Drinking alcohol.  Eating.  Smoking.  Exercising. Five minutes before checking your blood pressure:  Pee.  Sit in a dining chair. Avoid sitting in a soft couch or armchair.  Be quiet. Do not talk. How to take your blood pressure Follow the instructions that came with your machine. If you have a digital blood pressure monitor, these may be the instructions: 1. Sit up straight. 2. Place your feet on the floor. Do not cross your ankles or legs. 3. Rest your left arm at the level of your heart. You may rest it on a table, desk, or chair. 4. Pull up your shirt sleeve. 5. Wrap the blood pressure cuff around the upper part of your left arm. The cuff should be 1 inch (2.5 cm) above your elbow. It is best to wrap the cuff around bare skin. 6. Fit the cuff snugly around your arm. You should be able to place only one finger between the cuff and your arm. 7. Put the cord inside the groove of your elbow. 8. Press the power button. 9. Sit quietly while the cuff fills with air and loses air. 10. Write down the numbers on  the screen. 11. Wait 2-3 minutes and then repeat steps 1-10. What do the numbers mean? Two numbers make up your blood pressure. The first number is called systolic pressure. The second is called diastolic pressure. An example of a blood pressure reading is "120 over 80" (or 120/80). If you are an adult and do not have a medical condition, use this guide to find out if your blood pressure is normal: Normal  First number: below 120.  Second number: below 80. Elevated  First number: 120-129.  Second number: below 80. Hypertension stage 1  First number: 130-139.  Second number: 80-89. Hypertension stage 2  First number: 140 or above.  Second number: 90 or above. Your blood pressure is above normal even if only the top or bottom number is above normal. Follow these instructions at home:  Check your  blood pressure as often as your doctor tells you to.  Take your monitor to your next doctor's appointment. Your doctor will: ? Make sure you are using it correctly. ? Make sure it is working right.  Make sure you understand what your blood pressure numbers should be.  Tell your doctor if your medicines are causing side effects. Contact a doctor if:  Your blood pressure keeps being high. Get help right away if:  Your first blood pressure number is higher than 180.  Your second blood pressure number is higher than 120. This information is not intended to replace advice given to you by your health care provider. Make sure you discuss any questions you have with your health care provider. Document Revised: 10/03/2017 Document Reviewed: 03/29/2016 Elsevier Patient Education  2020 [employer].

## 2020-11-08 ENCOUNTER — Other Ambulatory Visit: Payer: Self-pay

## 2020-11-08 ENCOUNTER — Other Ambulatory Visit: Payer: Self-pay | Admitting: Family Medicine

## 2020-11-08 ENCOUNTER — Ambulatory Visit (INDEPENDENT_AMBULATORY_CARE_PROVIDER_SITE_OTHER): Payer: Medicare Other

## 2020-11-08 DIAGNOSIS — Z0000 Encounter for general adult medical examination without abnormal findings: Secondary | ICD-10-CM

## 2020-11-08 NOTE — Patient Instructions (Addendum)
Ricky Meyers , Thank you for taking time to come for your Medicare Wellness Visit. I appreciate your ongoing commitment to your health goals. Please review the following plan we discussed and let me know if I can assist you in the future.   Screening recommendations/referrals: Colonoscopy: Done 03/11/17 Recommended yearly ophthalmology/optometry visit for glaucoma screening and checkup Recommended yearly dental visit for hygiene and checkup  Vaccinations: Influenza vaccine: Done 08/22/20 Pneumococcal vaccine: Up to date Tdap vaccine: Up to date Shingles vaccine: Shingrix discussed. Please contact your pharmacy for coverage information.    Covid-19: Completed 1/15, 2/5, & 09/26/20  Advanced directives: Please bring a copy of your health care power of attorney and living will to the office at your convenience.  Conditions/risks identified: Lose weight   Next appointment: Follow up in one year for your annual wellness visit.   Preventive Care 77 Years and Older, Male Preventive care refers to lifestyle choices and visits with your health care provider that can promote health and wellness. What does preventive care include?  A yearly physical exam. This is also called an annual well check.  Dental exams once or twice a year.  Routine eye exams. Ask your health care provider how often you should have your eyes checked.  Personal lifestyle choices, including:  Daily care of your teeth and gums.  Regular physical activity.  Eating a healthy diet.  Avoiding tobacco and drug use.  Limiting alcohol use.  Practicing safe sex.  Taking low doses of aspirin every day.  Taking vitamin and mineral supplements as recommended by your health care provider. What happens during an annual well check? The services and screenings done by your health care provider during your annual well check will depend on your age, overall health, lifestyle risk factors, and family history of  disease. Counseling  Your health care provider may ask you questions about your:  Alcohol use.  Tobacco use.  Drug use.  Emotional well-being.  Home and relationship well-being.  Sexual activity.  Eating habits.  History of falls.  Memory and ability to understand (cognition).  Work and work Astronomer. Screening  You may have the following tests or measurements:  Height, weight, and BMI.  Blood pressure.  Lipid and cholesterol levels. These may be checked every 5 years, or more frequently if you are over 23 years old.  Skin check.  Lung cancer screening. You may have this screening every year starting at age 67 if you have a 30-pack-year history of smoking and currently smoke or have quit within the past 15 years.  Fecal occult blood test (FOBT) of the stool. You may have this test every year starting at age 40.  Flexible sigmoidoscopy or colonoscopy. You may have a sigmoidoscopy every 5 years or a colonoscopy every 10 years starting at age 1.  Prostate cancer screening. Recommendations will vary depending on your family history and other risks.  Hepatitis C blood test.  Hepatitis B blood test.  Sexually transmitted disease (STD) testing.  Diabetes screening. This is done by checking your blood sugar (glucose) after you have not eaten for a while (fasting). You may have this done every 1-3 years.  Abdominal aortic aneurysm (AAA) screening. You may need this if you are a current or former smoker.  Osteoporosis. You may be screened starting at age 76 if you are at high risk. Talk with your health care provider about your test results, treatment options, and if necessary, the need for more tests. Vaccines  Your health  care provider may recommend certain vaccines, such as:  Influenza vaccine. This is recommended every year.  Tetanus, diphtheria, and acellular pertussis (Tdap, Td) vaccine. You may need a Td booster every 10 years.  Zoster vaccine. You may  need this after age 8.  Pneumococcal 13-valent conjugate (PCV13) vaccine. One dose is recommended after age 66.  Pneumococcal polysaccharide (PPSV23) vaccine. One dose is recommended after age 40. Talk to your health care provider about which screenings and vaccines you need and how often you need them. This information is not intended to replace advice given to you by your health care provider. Make sure you discuss any questions you have with your health care provider. Document Released: 11/17/2015 Document Revised: 07/10/2016 Document Reviewed: 08/22/2015 Elsevier Interactive Patient Education  2017 [place] Prevention in the Home Falls can cause injuries. They can happen to people of all ages. There are many things you can do to make your home safe and to help prevent falls. What can I do on the outside of my home?  Regularly fix the edges of walkways and driveways and fix any cracks.  Remove anything that might make you trip as you walk through a door, such as a raised step or threshold.  Trim any bushes or trees on the path to your home.  Use bright outdoor lighting.  Clear any walking paths of anything that might make someone trip, such as rocks or tools.  Regularly check to see if handrails are loose or broken. Make sure that both sides of any steps have handrails.  Any raised decks and porches should have guardrails on the edges.  Have any leaves, snow, or ice cleared regularly.  Use sand or salt on walking paths during winter.  Clean up any spills in your garage right away. This includes oil or grease spills. What can I do in the bathroom?  Use night lights.  Install grab bars by the toilet and in the tub and shower. Do not use towel bars as grab bars.  Use non-skid mats or decals in the tub or shower.  If you need to sit down in the shower, use a plastic, non-slip stool.  Keep the floor dry. Clean up any water that spills on the floor as soon as it  happens.  Remove soap buildup in the tub or shower regularly.  Attach bath mats securely with double-sided non-slip rug tape.  Do not have throw rugs and other things on the floor that can make you trip. What can I do in the bedroom?  Use night lights.  Make sure that you have a light by your bed that is easy to reach.  Do not use any sheets or blankets that are too big for your bed. They should not hang down onto the floor.  Have a firm chair that has side arms. You can use this for support while you get dressed.  Do not have throw rugs and other things on the floor that can make you trip. What can I do in the kitchen?  Clean up any spills right away.  Avoid walking on wet floors.  Keep items that you use a lot in easy-to-reach places.  If you need to reach something above you, use a strong step stool that has a grab bar.  Keep electrical cords out of the way.  Do not use floor polish or wax that makes floors slippery. If you must use wax, use non-skid floor wax.  Do not have  throw rugs and other things on the floor that can make you trip. What can I do with my stairs?  Do not leave any items on the stairs.  Make sure that there are handrails on both sides of the stairs and use them. Fix handrails that are broken or loose. Make sure that handrails are as long as the stairways.  Check any carpeting to make sure that it is firmly attached to the stairs. Fix any carpet that is loose or worn.  Avoid having throw rugs at the top or bottom of the stairs. If you do have throw rugs, attach them to the floor with carpet tape.  Make sure that you have a light switch at the top of the stairs and the bottom of the stairs. If you do not have them, ask someone to add them for you. What else can I do to help prevent falls?  Wear shoes that:  Do not have high heels.  Have rubber bottoms.  Are comfortable and fit you well.  Are closed at the toe. Do not wear sandals.  If you  use a stepladder:  Make sure that it is fully opened. Do not climb a closed stepladder.  Make sure that both sides of the stepladder are locked into place.  Ask someone to hold it for you, if possible.  Clearly mark and make sure that you can see:  Any grab bars or handrails.  First and last steps.  Where the edge of each step is.  Use tools that help you move around (mobility aids) if they are needed. These include:  Canes.  Walkers.  Scooters.  Crutches.  Turn on the lights when you go into a dark area. Replace any light bulbs as soon as they burn out.  Set up your furniture so you have a clear path. Avoid moving your furniture around.  If any of your floors are uneven, fix them.  If there are any pets around you, be aware of where they are.  Review your medicines with your doctor. Some medicines can make you feel dizzy. This can increase your chance of falling. Ask your doctor what other things that you can do to help prevent falls. This information is not intended to replace advice given to you by your health care provider. Make sure you discuss any questions you have with your health care provider. Document Released: 08/17/2009 Document Revised: 03/28/2016 Document Reviewed: 11/25/2014 Elsevier Interactive Patient Education  2017 [employer].

## 2020-11-08 NOTE — Progress Notes (Signed)
Virtual Visit via Telephone Note  I connected with  Ricky Meyers on 11/08/20 at  8:45 AM EST by telephone and verified that I am speaking with the correct person using two identifiers.  Medicare Annual Wellness visit completed telephonically due to Covid-19 pandemic.   Persons participating in this call: This Health Coach and this patient.   Location: Patient: Home Provider: Office   I discussed the limitations, risks, security and privacy concerns of performing an evaluation and management service by telephone and the availability of in person appointments. The patient expressed understanding and agreed to proceed.  Unable to perform video visit due to video visit attempted and failed and/or patient does not have video capability.   Some vital signs may be absent or patient reported.   Marzella Schleinina H Meron Bocchino, LPN    Subjective:   Ricky Meyers is a 75 y.o. male who presents for an Initial Medicare Annual Wellness Visit.  Review of Systems     Cardiac Risk Factors include: advanced age (>2155men, 80>65 women);dyslipidemia;hypertension;obesity (BMI >30kg/m2)     Objective:    There were no vitals filed for this visit. There is no height or weight on file to calculate BMI.  Advanced Directives 11/08/2020 06/15/2019 08/24/2017 06/10/2017 03/11/2017 02/25/2017 10/02/2016  Does Patient Have a Medical Advance Directive? Yes No - No Yes Yes Yes  Type of Advance Directive Living will - Healthcare Power of Attorney - Healthcare Power of [place];Living will Healthcare Power of [place];Living will -  Does patient want to make changes to medical advance directive? - - No - Patient declined - - - -  Copy of Healthcare Power of Attorney in Chart? - - No - copy requested - No - copy requested No - copy requested -  Would patient like information on creating a medical advance directive? - No - Patient declined - - - - -    Current Medications (verified) Outpatient Encounter Medications as of  11/08/2020  Medication Sig  . allopurinol (ZYLOPRIM) 300 MG tablet TAKE 1 TABLET BY MOUTH EVERY DAY  . amLODipine (NORVASC) 5 MG tablet TAKE 1 TABLET BY MOUTH EVERY DAY  . atorvastatin (LIPITOR) 40 MG tablet TAKE 1 TABLET BY MOUTH EVERY DAY  . cyclobenzaprine (FLEXERIL) 10 MG tablet TAKE 1 TABLET BY MOUTH 3 TIMES DAILY AS NEEDED FOR MUSCLE SPASMS  . gabapentin (NEURONTIN) 100 MG capsule TAKE 1 CAPSULE BY MOUTH 2 TIMES DAILY  . oxyCODONE-acetaminophen (PERCOCET) 10-325 MG tablet Take 1 tablet by mouth every 6 (six) hours as needed for pain.  Marland Kitchen. PARoxetine (PAXIL) 20 MG tablet TAKE 1 TABLET BY MOUTH EVERY MORNING  . psyllium (METAMUCIL) 58.6 % packet Take 1 packet by mouth daily as needed (for constipation).   . rivaroxaban (XARELTO) 10 MG TABS tablet Take 1 tablet (10 mg total) by mouth daily.  Marland Kitchen. telmisartan-hydrochlorothiazide (MICARDIS HCT) 80-25 MG tablet Take 1 tablet by mouth daily.  . [DISCONTINUED] naproxen (NAPROSYN) 500 MG tablet TAKE 1 TABLET BY MOUTH TWICE A DAY WITH A MEAL (Patient not taking: Reported on 10/19/2020)  . [DISCONTINUED] sildenafil (VIAGRA) 100 MG tablet Take 1 tablet (100 mg total) by mouth daily as needed for erectile dysfunction. (Patient not taking: Reported on 10/19/2020)   No facility-administered encounter medications on file as of 11/08/2020.    Allergies (verified) Patient has no known allergies.   History: Past Medical History:  Diagnosis Date  . Arthritis    neck and back   . Chronic neck pain   .  Depression   . ED (erectile dysfunction)   . GERD (gastroesophageal reflux disease)    dysphagia  . Gout    sees Dr. Leigh Aurora   . Hyperlipidemia   . Hypertension   . OSA (obstructive sleep apnea) 12/18/2017   Severe with AHI at 70.1/hr and is now on CPAP at [place]  . Prostate cancer [facility])    history of  . Pulmonary embolism ([place]) 06/2019  . Sleep apnea    snoring/never checked   Past Surgical History:  Procedure Laterality Date  . AMPUTATION  Left 07/18/2018   Procedure: LEFT RING FINGER REVISION AMPUTATION;  Surgeon: Leanora Cover, MD;  Location: [place];  Service: Orthopedics;  Laterality: Left;  . CERVICAL FUSION  12/02/07   per Dr. Sherley Bounds  . COLONOSCOPY  03/11/2017   per Dr. Ardis Hughs, clear, no repeats needed   . injection lower back Right 09/17/2016  . PROSTATECTOMY    . ROTATOR CUFF REPAIR Right 11/2017  . TONSILLECTOMY    . VASECTOMY     Family History  Problem Relation Age of Onset  . Cancer Other        breast, porstate  . Hypertension Other   . Stroke Other   . Breast cancer Mother   . Diverticulitis Mother   . Heart disease Father   . Prostate cancer Father   . Stroke Father   . Dementia Sister   . Colon cancer Neg Hx    Social History   Socioeconomic History  . Marital status: Divorced    Spouse name: Not on file  . Number of children: Not on file  . Years of education: Not on file  . Highest education level: Not on file  Occupational History  . Occupation: retired  Tobacco Use  . Smoking status: Never Smoker  . Smokeless tobacco: Never Used  Vaping Use  . Vaping Use: Never used  Substance and Sexual Activity  . Alcohol use: Yes    Alcohol/week: 0.0 standard drinks    Comment: occ  . Drug use: No  . Sexual activity: Not on file  Other Topics Concern  . Not on file  Social History Narrative  . Not on file   Social Determinants of Health   Financial Resource Strain: Low Risk   . Difficulty of Paying Living Expenses: Not hard at all  Food Insecurity: No Food Insecurity  . Worried About Charity fundraiser in the Last Year: Never true  . Ran Out of Food in the Last Year: Never true  Transportation Needs: No Transportation Needs  . Lack of Transportation (Medical): No  . Lack of Transportation (Non-Medical): No  Physical Activity: Insufficiently Active  . Days of Exercise per Week: 4 days  . Minutes of Exercise per Session: 20 min  Stress: No Stress Concern Present  . Feeling of  Stress : Not at all  Social Connections: Moderately Integrated  . Frequency of Communication with Friends and Family: More than three times a week  . Frequency of Social Gatherings with Friends and Family: Once a week  . Attends Religious Services: More than 4 times per year  . Active Member of Clubs or Organizations: Yes  . Attends Archivist Meetings: 1 to 4 times per year  . Marital Status: Divorced    Tobacco Counseling Counseling given: Not Answered   Clinical Intake:  Pre-visit preparation completed: Yes  Pain : No/denies pain     BMI - recorded: 31.12 Nutritional Status: BMI >  30  Obese Nutritional Risks: None Diabetes: No  How often do you need to have someone help you when you read instructions, pamphlets, or other written materials from your doctor or pharmacy?: 1 - Never  Diabetic?No  Interpreter Needed?: No  Information entered by :: Charlott Rakes, LPN   Activities of Daily Living In your present state of health, do you have any difficulty performing the following activities: 11/08/2020  Hearing? Y  Comment may need hearing  Vision? N  Difficulty concentrating or making decisions? Y  Comment memory at times  Walking or climbing stairs? N  Dressing or bathing? N  Doing errands, shopping? N  Preparing Food and eating ? N  Using the Toilet? N  In the past six months, have you accidently leaked urine? N  Do you have problems with loss of bowel control? N  Managing your Medications? N  Managing your Finances? N  Housekeeping or managing your Housekeeping? N  Some recent data might be hidden    Patient Care Team: [name], MD as PCP - General [name], MD as PCP - Cardiology (Cardiology) Viona Gilmore, [facility] as Pharmacist (Pharmacist)  Indicate any recent Medical Services you may have received from other than [facility] providers in the past year (date may be approximate).     Assessment:   This is a routine wellness  examination for Ricky Meyers.  Hearing/Vision screen  Hearing Screening   125Hz  250Hz  500Hz  1000Hz  2000Hz  3000Hz  4000Hz  6000Hz  8000Hz   Right ear:           Left ear:           Comments: Pt states mild loss may need hearing aids   Vision Screening Comments: Pt follows up with provider for annual eye exams unsure of name  Dietary issues and exercise activities discussed: Current Exercise Habits: Home exercise routine, Type of exercise: strength training/weights;Other - see comments (bicycle stationary), Time (Minutes): 20, Frequency (Times/Week): 4, Weekly Exercise (Minutes/Week): 80  Goals    . Patient Stated     Lose weight    . Pharmacy Care Plan     CARE PLAN ENTRY (see longitudinal plan of care for additional care plan information)  Current Barriers:  . Chronic Disease Management support, education, and care coordination needs related to Hypertension, Hyperlipidemia, Depression, Gout, and history of PE and pain   Hypertension BP Readings from Last 3 Encounters:  08/22/20 112/62  07/03/20 110/70  05/19/20 120/60   . Pharmacist Clinical Goal(s): o Over the next 120 days, patient will work with PharmD and providers to maintain BP goal <140/90 . Current regimen:  . Telmisartan-HCTZ 80-25 1 tablet daily  . Amlodipine 5 mg 1 tablet daily . Interventions: o Discussed the importance of checking blood pressure at home o DASH eating plan recommendations: . Emphasizes vegetables, fruits, and whole-grains . Includes fat-free or low-fat dairy products, fish, poultry, beans, nuts, and vegetable oils . Limits foods that are high in saturated fat. These foods include fatty meats, full-fat dairy products, and tropical oils such as coconut, palm kernel, and palm oils. . Limits sugar-sweetened beverages and sweets . Limiting sodium intake to < 1500 mg/day . Patient self care activities - Over the next 120 days, patient will: o Check blood pressure weekly, document, and provide at future  appointments o Ensure daily salt intake < 2300 mg/day  Hyperlipidemia Lab Results  Component Value Date/Time   LDLCALC 88 08/22/2020 12:08 PM   LDLDIRECT 161.4 09/29/2012 08:11 AM   .  Pharmacist Clinical Goal(s): o Over the next 120 days, patient will work with PharmD and providers to maintain LDL goal < 100 . Current regimen:  o Atorvastatin 20 mg 1 tablet daily . Interventions: o Discussed lowering cholesterol through diet by: Marland Kitchen Limiting foods with cholesterol such as liver and other organ meats, egg yolks, shrimp, and whole milk dairy products . Avoiding saturated fats and trans fats and incorporating healthier fats, such as lean meat, nuts, and unsaturated oils like canola and olive oils . Eating foods with soluble fiber such as whole-grain cereals such as oatmeal and oat bran, fruits such as apples, bananas, oranges, pears, and prunes, legumes such as kidney beans, lentils, chick peas, black-eyed peas, and lima beans, and green leafy vegetables . Limiting alcohol intake . Patient self care activities - Over the next 120 days, patient will: o Continue current medication  History of PE . Pharmacist Clinical Goal(s): o Over the next 120 days, patient will work with PharmD and providers to prevent blood cots . Current regimen:  o Xarelto 10 mg 1 tablet daily . Interventions: o Discussed monitoring for signs of bleeding such as unexplained and excessive bleeding from a cut or injury, easy or excessive bruising, blood in urine or stools, and nosebleeds without a known cause o Discussed signs of a DVT (blood clot in the leg) such as one leg swelling, warm to the touch, and redness . Patient self care activities - Over the next 120 days, patient will: o Continue current medication  Arthritis/pain . Pharmacist Clinical Goal(s) o Over the next 120 days, patient will work with PharmD and providers to manage symptoms of pain . Current regimen:  . Oxycodone-APAP 10-325 mg 1 tablet as  needed . Naproxen 500 mg 1 tablet twice daily with meals as needed . Gabapentin 100 mg 1 capsule twice daily  . Cyclobenzaprine 10 mg 1 table three times daily as needed  . Interventions: o Discussed using Oxycodone as sparingly as possible . Patient self care activities - Over the next 120 days, patient will: o Continue current medications  Depression . Pharmacist Clinical Goal(s) o Over the next 120 days, patient will work with PharmD and providers to manage symptoms of depression . Current regimen:  o Paroxetine 20 mg 1 tablet every morning . Interventions: o Discussed that this type of medicine isn't always needed long term  . Patient self care activities - Over the next 120 days, patient will: o Discuss with Dr. Sarajane Jews about the need for this medication or stopping it  Gout Lab Results  Component Value Date   LABURIC 4.9 08/22/2020   . Pharmacist Clinical Goal(s) o Over the next 120 days, patient will work with PharmD and providers to maintain uric acid < 6 and prevent gout flare ups . Current regimen:  o Allopurinol 300 mg 1 tablet daily . Interventions: o Discussed avoiding/limiting foods with high purine content: . wild game, such as veal, venison, and duck . red meat . some seafood, including tuna, sardines, anchovies, herring, mussels, codfish, scallops, trout, and haddock . organ meat, such as liver, kidneys, and thymus glands, which are known as sweetbreads o Avoiding/limiting foods to allow the body to process purines more effectively: . High-fat foods: Fat holds uric acid in the kidneys, so a person should avoid fried foods, full-fat dairy products, rich desserts, and other high-fat items. . Alcohol: Beer and whiskey are high in purines, but some research shows that all alcohol consumption can raise uric acid levels. Alcohol  also causes dehydration, which hampers the body's ability to flush out uric acid. . Sweetened beverages: Fructose is an ingredient in many  sweetened beverages, including fruit juices and sodas, and consuming too much puts a person at risk for gout. . Patient self care activities - Over the next 120 days, patient will: o Continue current medication  Medication management . Pharmacist Clinical Goal(s): o Over the next 120 days, patient will work with PharmD and providers to maintain optimal medication adherence . Current pharmacy: [facility] . Interventions o Comprehensive medication review performed. o Continue current medication management strategy . Patient self care activities - Over the next 120 days, patient will: o Take medications as prescribed o Report any questions or concerns to PharmD and/or provider(s)  Initial goal documentation       Depression Screen PHQ 2/9 Scores 11/08/2020 08/22/2020 07/15/2018 03/07/2016 01/23/2015  PHQ - 2 Score 0 0 0 0 0  PHQ- 9 Score - 0 - - -  Exception Documentation - Medical reason - - -    Fall Risk Fall Risk  11/08/2020 08/22/2020 07/15/2018 10/16/2017 03/07/2016  Falls in the past year? 0 0 No Yes No  Number falls in past yr: 0 0 - - -  Injury with Fall? 0 0 - Yes -  Follow up Falls prevention discussed - - - -    FALL RISK PREVENTION PERTAINING TO THE HOME:  Any stairs in or around the home? Yes  If so, are there any without handrails? No  Home free of loose throw rugs in walkways, pet beds, electrical cords, etc? Yes  Adequate lighting in your home to reduce risk of falls? Yes   ASSISTIVE DEVICES UTILIZED TO PREVENT FALLS:  Life alert? No  Use of a cane, walker or w/c? No  Grab bars in the bathroom? No  Shower chair or bench in shower? No  Elevated toilet seat or a handicapped toilet? Yes   TIMED UP AND GO:  Was the test performed? No .    Cognitive Function:     6CIT Screen 11/08/2020  What Year? 0 points  What month? 0 points  Count back from 20 0 points  Months in reverse 0 points  Repeat phrase 0 points    Immunizations Immunization History   Administered Date(s) Administered  . Fluad Quad(high Dose 65+) 08/22/2020  . Influenza Split 09/05/2011, 11/09/2012  . Influenza Whole 10/04/2009, 09/24/2010  . Influenza, High Dose Seasonal PF 07/17/2015, 09/01/2017, 07/15/2018, 07/28/2019  . Influenza,inj,Quad PF,6+ Mos 11/24/2013, 11/16/2014  . PFIZER SARS-COV-2 Vaccination 11/19/2019, 12/10/2019, 09/26/2020  . Pneumococcal Conjugate-13 07/17/2015  . Pneumococcal Polysaccharide-23 10/04/2009  . Pneumococcal-Unspecified 09/03/2017  . Td 03/10/2009  . Tdap 07/18/2018  . Zoster 09/05/2011    TDAP status: Up to date  Flu Vaccine status: Up to date  Pneumococcal vaccine status: Up to date  Covid-19 vaccine status: Completed vaccines  Qualifies for Shingles Vaccine? Yes   Zostavax completed Yes   Shingrix Completed?: No.    Education has been provided regarding the importance of this vaccine. Patient has been advised to call insurance company to determine out of pocket expense if they have not yet received this vaccine. Advised may also receive vaccine at local pharmacy or Health Dept. Verbalized acceptance and understanding.  Screening Tests Health Maintenance  Topic Date Due  . Hepatitis C Screening  Never done  . TETANUS/TDAP  07/18/2028  . INFLUENZA VACCINE  Completed  . COVID-19 Vaccine  Completed  . PNA vac Low Risk  Adult  Completed    Health Maintenance  Health Maintenance Due  Topic Date Due  . Hepatitis C Screening  Never done    Colorectal cancer screening: No longer required.   Additional Screening:  Hepatitis C Screening: does qualify  Vision Screening: Recommended annual ophthalmology exams for early detection of glaucoma and other disorders of the eye. Is the patient up to date with their annual eye exam?  Yes  Who is the provider or what is the name of the office in which the patient attends annual eye exams? Unsure of providers name  Dental Screening: Recommended annual dental exams for proper oral  hygiene  Community Resource Referral / Chronic Care Management: CRR required this visit?  No   CCM required this visit?  No      Plan:     I have personally reviewed and noted the following in the patient's chart:   . Medical and social history . Use of alcohol, tobacco or illicit drugs  . Current medications and supplements . Functional ability and status . Nutritional status . Physical activity . Advanced directives . List of other physicians . Hospitalizations, surgeries, and ER visits in previous 12 months . Vitals . Screenings to include cognitive, depression, and falls . Referrals and appointments  In addition, I have reviewed and discussed with patient certain preventive protocols, quality metrics, and best practice recommendations. A written personalized care plan for preventive services as well as general preventive health recommendations were provided to patient.     Marzella Schlein, LPN   7/0/1779   Nurse Notes: Pt is requesting if he needs a urine sample related to pain meds, if so he is requesting a my chart message.

## 2020-11-13 ENCOUNTER — Other Ambulatory Visit: Payer: Self-pay | Admitting: Family Medicine

## 2020-11-14 NOTE — Telephone Encounter (Signed)
Last office visit---08/23/2020 Last refill---10/23/2020--120 tabs no refills

## 2020-11-16 DIAGNOSIS — H2513 Age-related nuclear cataract, bilateral: Secondary | ICD-10-CM | POA: Diagnosis not present

## 2020-11-16 DIAGNOSIS — D3131 Benign neoplasm of right choroid: Secondary | ICD-10-CM | POA: Diagnosis not present

## 2020-11-16 DIAGNOSIS — H25013 Cortical age-related cataract, bilateral: Secondary | ICD-10-CM | POA: Diagnosis not present

## 2020-11-16 DIAGNOSIS — H35033 Hypertensive retinopathy, bilateral: Secondary | ICD-10-CM | POA: Diagnosis not present

## 2020-11-20 ENCOUNTER — Ambulatory Visit: Payer: Medicare Other | Admitting: Internal Medicine

## 2020-11-22 ENCOUNTER — Encounter: Payer: Self-pay | Admitting: Family Medicine

## 2020-11-22 ENCOUNTER — Ambulatory Visit (INDEPENDENT_AMBULATORY_CARE_PROVIDER_SITE_OTHER): Payer: Medicare Other | Admitting: Family Medicine

## 2020-11-22 ENCOUNTER — Other Ambulatory Visit: Payer: Self-pay

## 2020-11-22 VITALS — BP 114/72 | HR 78 | Ht 73.0 in | Wt 237.2 lb

## 2020-11-22 DIAGNOSIS — M159 Polyosteoarthritis, unspecified: Secondary | ICD-10-CM | POA: Diagnosis not present

## 2020-11-22 DIAGNOSIS — F1190 Opioid use, unspecified, uncomplicated: Secondary | ICD-10-CM | POA: Diagnosis not present

## 2020-11-22 MED ORDER — OXYCODONE-ACETAMINOPHEN 10-325 MG PO TABS: 1.0000 | ORAL_TABLET | Freq: Four times a day (QID) | ORAL | 0 refills | Status: DC | PRN

## 2020-11-22 MED ORDER — OXYCODONE-ACETAMINOPHEN 10-325 MG PO TABS: 1.0000 | ORAL_TABLET | Freq: Four times a day (QID) | ORAL | 0 refills | Status: AC | PRN

## 2020-11-22 NOTE — Progress Notes (Signed)
   Subjective:    Patient ID: Ricky Meyers, male    DOB: 05/29/1946, 75 y.o.   MRN: [id number]  HPI Here for pain management, he is doing well.  Indication for chronic opioid: OA Medication and dose: Percocet 10-325 # pills per month: 120 Last UDS date: 11-22-20 Opioid Treatment Agreement signed (Y/N): 01-15-19 Opioid Treatment Agreement last reviewed with patient:  11-22-20 [place] reviewed this encounter (include red flags): Yes    Review of Systems     Objective:   Physical Exam        Assessment & Plan:  Pain management, meds were refilled.  Alysia Penna, MD

## 2020-11-25 ENCOUNTER — Other Ambulatory Visit: Payer: Self-pay | Admitting: Family Medicine

## 2020-11-25 LAB — DRUG MONITOR, PANEL 1, W/CONF, URINE
Amphetamines: NEGATIVE ng/mL (ref <500)
Barbiturates: NEGATIVE ng/mL (ref <300)
Benzodiazepines: NEGATIVE ng/mL (ref <100)
Cocaine Metabolite: NEGATIVE ng/mL (ref <150)
Codeine: NEGATIVE ng/mL (ref <50)
Creatinine: 169.7 mg/dL
Hydrocodone: 243 ng/mL — ABNORMAL HIGH (ref <50)
Hydromorphone: NEGATIVE ng/mL (ref <50)
Marijuana Metabolite: NEGATIVE ng/mL (ref <20)
Methadone Metabolite: NEGATIVE ng/mL (ref <100)
Morphine: NEGATIVE ng/mL (ref <50)
Norhydrocodone: 592 ng/mL — ABNORMAL HIGH (ref <50)
Noroxycodone: 1927 ng/mL — ABNORMAL HIGH (ref <50)
Opiates: POSITIVE ng/mL — AB (ref <100)
Oxidant: NEGATIVE ug/mL
Oxycodone: 846 ng/mL — ABNORMAL HIGH (ref <50)
Oxycodone: POSITIVE ng/mL — AB (ref <100)
Oxymorphone: 137 ng/mL — ABNORMAL HIGH (ref <50)
Phencyclidine: NEGATIVE ng/mL (ref <25)
pH: 6.5 (ref 4.5–9.0)

## 2020-11-25 LAB — DM TEMPLATE

## 2020-11-30 ENCOUNTER — Other Ambulatory Visit: Payer: Self-pay | Admitting: Family Medicine

## 2020-12-04 ENCOUNTER — Other Ambulatory Visit: Payer: Self-pay

## 2020-12-04 ENCOUNTER — Ambulatory Visit (INDEPENDENT_AMBULATORY_CARE_PROVIDER_SITE_OTHER): Payer: Medicare Other | Admitting: Internal Medicine

## 2020-12-04 ENCOUNTER — Encounter: Payer: Self-pay | Admitting: Internal Medicine

## 2020-12-04 DIAGNOSIS — I10 Essential (primary) hypertension: Secondary | ICD-10-CM | POA: Diagnosis not present

## 2020-12-04 DIAGNOSIS — R058 Other specified cough: Secondary | ICD-10-CM | POA: Diagnosis not present

## 2020-12-04 LAB — CBC WITH DIFFERENTIAL/PLATELET
Basophils Absolute: 0 10*3/uL (ref 0.0–0.1)
Basophils Relative: 0.5 % (ref 0.0–3.0)
Eosinophils Absolute: 0.2 10*3/uL (ref 0.0–0.7)
Eosinophils Relative: 3.1 % (ref 0.0–5.0)
HCT: 38.8 % — ABNORMAL LOW (ref 39.0–52.0)
Hemoglobin: 13.3 g/dL (ref 13.0–17.0)
Lymphocytes Relative: 29.7 % (ref 12.0–46.0)
Lymphs Abs: 1.7 10*3/uL (ref 0.7–4.0)
MCHC: 34.4 g/dL (ref 30.0–36.0)
MCV: 95.8 fl (ref 78.0–100.0)
Monocytes Absolute: 0.7 10*3/uL (ref 0.1–1.0)
Monocytes Relative: 11.3 % (ref 3.0–12.0)
Neutro Abs: 3.3 10*3/uL (ref 1.4–7.7)
Neutrophils Relative %: 55.4 % (ref 43.0–77.0)
Platelets: 151 10*3/uL (ref 150.0–400.0)
RBC: 4.05 Mil/uL — ABNORMAL LOW (ref 4.22–5.81)
RDW: 14.1 % (ref 11.5–15.5)
WBC: 5.9 10*3/uL (ref 4.0–10.5)

## 2020-12-04 MED ORDER — FAMOTIDINE 20 MG PO TABS: ORAL_TABLET | ORAL | 11 refills | Status: DC

## 2020-12-04 MED ORDER — PANTOPRAZOLE SODIUM 40 MG PO TBEC: 40.0000 mg | DELAYED_RELEASE_TABLET | Freq: Every day | ORAL | 2 refills | Status: DC

## 2020-12-04 NOTE — Assessment & Plan Note (Signed)
Try off acei 07/03/2020 due to hoarseness / unexplained sob    Hoarseness some better.  Although even in retrospect it may not be clear the ACEi contributed to the pt's symptoms,  Pt improved off them and adding them back at this point or in the future would risk confusion in interpretation of non-specific respiratory symptoms to which this patient is prone  ie  Better not to muddy the waters here.   >>> continue amlopdine/ micaridis hct present doses.          Each maintenance medication was reviewed in detail including emphasizing most importantly the difference between maintenance and prns and under what circumstances the prns are to be triggered using an action plan format where appropriate.  Total time for H and P, chart review, counseling, and generating customized AVS unique to this office visit / same day charting => 30 min

## 2020-12-04 NOTE — Progress Notes (Signed)
Ricky Meyers, male    DOB: April 16, 1946,    MRN: [id number]   Brief patient profile:  61 yowm never smoker very active including eliptical x 40 min x 4 times weekly  Then:    Admit date: 06/15/2019 Discharge date: 06/18/2019  Admitted From: Home  Disposition:  Home    Brief/Interim Summary: 75 year old male who presented with dyspnea. He does have significant past medical history for hypertension, dyslipidemia and obesity. Reported several months of declining energy and decreased exercise tolerance. Worsening dyspnea on exertion to the point where he became dyspneic with minimal efforts. On his initial physical examination blood pressure 128/79, heart rate 91, respiratory rate 19, oxygen saturation 96%, moist mucous membranes, lungs clear to auscultation bilaterally, heart S1-S2 present and rhythmic, abdomen soft nontender, no lower extremity edema. Sodium 139, potassium 4.2, chloride 107, bicarb 22, glucose 118, BUN 24, creatinine 1.1, high sensitive troponin 259, white count 9.8, hemoglobin 13.2, hematocrit 39.3, platelets 170.  SARS COVID-19 was negative Chest x-ray negative for infiltrates. CT chest with acute bilateral lobar and segmental pulmonary emboli, involving all lobes of both lungs, positive right heart strain, with RV/LV ratio 1.9.EKG 108 bpm, right axis deviation, interventricular conduction delay, sinus rhythm, no ST segment changes, poor R wave progression, negative T waves in V5 to V6.  Patient was admitted to the hospital working diagnosis of acute submassive pulmonary embolism.   1.  Acute pulmonary embolism, submassive,/ acute left femoral and left posterior tibial veins deep vein thrombosis. Complicated with acute core pulmonale. Patient was admitted to the medical telemetry unit, he received anticoagulation with unfractionated IV heparin with good toleration.  His blood pressure remained stable, with systolic between 712 and 458 mmHG, his oxygenation was 95 to  97% on room air.  Further work-up with echocardiography showed a normal LV systolic function, ejection fraction 55 to 60%, the right ventricle systolic function was moderately reduced and the cavity was mildly enlarged.  It was noted left with bowing the interatrial septum, suggestive of elevated right atrial pressure.  Ultrasonography of his lower extremities showed acute deep vein thrombosis involving the left femoral vein and left posterior tibial veins.  Right lower extremity negative for deep vein thrombosis.  Patient was able to ambulate with physical therapy with no difficulties.  Patient has been transitioned to oral anticoagulation with rivaroxaban.  He will follow-up as an outpatient within 7 days.  2.  Hypertension.  Blood pressure remained well controlled, patient was continued on amlodipine, at discharge he will resume  3.  Dyslipidemia.  Continue statin therapy.  4.  Gout.  No acute flare, continue allopurinol.  5.  Depression.  Continue paroxetine.  6.  Obesity.  His calculated BMI is 30.5.     History of Present Illness  03/30/2020  Pulmonary/ 1st office eval/Ricky Meyers  Chief Complaint  Patient presents with  . Consult    pt states  taking xaralto are clots dissolving in lungs. is it ok to exercise  Dyspnea:  MMRC[facility] = can walk nl pace, flat grade, can't hurry or go uphills or steps s sob but not back on eliptical yet   Cough: none  Sleep: flat / on side / one pillow  SABA use: none  rec Ok to reduce the naprosyn to once a day with meals if you don't need it for arthritis/joint pain  To get the most out of exercise      07/03/2020  f/u ov/Ricky Meyers re: PE/ dvt  Chief Complaint  Patient presents  with  . Follow-up    Everything is going well.   Dyspnea:  [name] = can walk nl pace, flat grade, can't hurry or go uphills or steps s sob  But not as good as he was prior to PE while going to gym at [employer]  Cough: slt raspy x years  Sleeping: on cpap/ does fine SABA use:  none  02: none  rec Ok to reduce the naprosyn to once a day with meals if you don't need it for arthritis/joint pain  To get the most out of exercise, you need to be continuously aware that you are short of breath, but never out of breath, for 30 minutes daily as a minimum. As you improve, it will actually be easier for you to do the same amount of exercise  in  30 minutes so always push to the level where you are short of breath.   Stop lisinopril and your throat issues should improve over several week Micardis 80-25  Start with one half daily for at least 6 weeks to see if it's the right strength and your throat congestion goes away Reduce the xarelto 10 mg daily  Please schedule a follow up visit in 3 months but call sooner if needed     12/04/2020  f/u ov/Ricky Meyers re:  PE/ dvt and chronic cough with assoc pnds better but but not gone  Chief Complaint  Patient presents with  . Follow-up    Clearing throat with yellowish phlegm  Dyspnea:  MMRC1 = can walk nl pace, flat grade, can't hurry or go uphills or steps s sob   Cough: p stirring /voice use  Min actual mucus  Sleeping: pm cpap, bed flat no resp symptoms  SABA use: none  02: none    No obvious day to day or daytime variability or assoc excess/ purulent sputum or mucus plugs or hemoptysis or cp or chest tightness, subjective wheeze or overt sinus or hb symptoms.   Sleeping as above without nocturnal  or early am exacerbation  of respiratory  c/o's or need for noct saba. Also denies any obvious fluctuation of symptoms with weather or environmental changes or other aggravating or alleviating factors except as outlined above   No unusual exposure hx or h/o childhood pna/ asthma or knowledge of premature birth.  Current Allergies, Complete Past Medical History, Past Surgical History, Family History, and Social History were reviewed in [employer] record.  ROS  The following are not active complaints unless  bolded Hoarseness, sore throat, dysphagia, dental problems, itching, sneezing,  nasal congestion or discharge of excess mucus or purulent secretions, ear ache,   fever, chills, sweats, unintended wt loss or wt gain, classically pleuritic or exertional cp,  orthopnea pnd or arm/hand swelling  or leg swelling, presyncope, palpitations, abdominal pain, anorexia, nausea, vomiting, diarrhea  or change in bowel habits or change in bladder habits, change in stools or change in urine, dysuria, hematuria,  rash, arthralgias, visual complaints, headache, numbness, weakness or ataxia or problems with walking or coordination,  change in mood or  memory.        Current Meds  Medication Sig  . allopurinol (ZYLOPRIM) 300 MG tablet TAKE 1 TABLET BY MOUTH EVERY DAY  . amLODipine (NORVASC) 5 MG tablet TAKE 1 TABLET BY MOUTH EVERY DAY  . atorvastatin (LIPITOR) 40 MG tablet TAKE 1 TABLET BY MOUTH EVERY DAY  . cyclobenzaprine (FLEXERIL) 10 MG tablet TAKE 1 TABLET BY MOUTH 3 TIMES  DAILY AS NEEDED FOR MUSCLE SPASMS  . gabapentin (NEURONTIN) 100 MG capsule TAKE 1 CAPSULE BY MOUTH 2 TIMES DAILY  . lisinopril-hydrochlorothiazide (ZESTORETIC) 20-25 MG tablet TAKE 1 TABLET BY MOUTH EVERY DAY  . [START ON 01/20/2021] oxyCODONE-acetaminophen (PERCOCET) 10-325 MG tablet Take 1 tablet by mouth every 6 (six) hours as needed for pain.  Marland Kitchen PARoxetine (PAXIL) 20 MG tablet TAKE 1 TABLET BY MOUTH EVERY MORNING  . psyllium (METAMUCIL) 58.6 % packet Take 1 packet by mouth daily as needed (for constipation).   . rivaroxaban (XARELTO) 10 MG TABS tablet Take 1 tablet (10 mg total) by mouth daily.  Marland Kitchen telmisartan-hydrochlorothiazide (MICARDIS HCT) 80-25 MG tablet Take 1 tablet by mouth daily.                      Past Medical History:  Diagnosis Date  . Arthritis    neck and back   . Chronic neck pain   . Depression   . ED (erectile dysfunction)   . GERD (gastroesophageal reflux disease)    dysphagia  . Gout    sees Dr. Leigh Aurora   . Hyperlipidemia   . Hypertension   . OSA (obstructive sleep apnea) 12/18/2017   Severe with AHI at 70.1/hr and is now on CPAP at [place]  . Prostate cancer [facility])    history of  . Pulmonary embolism ([place]) 06/2019  . Sleep apnea    snoring/never checked       Objective:     12/04/2020       237  07/03/20 233 lb (105.7 kg)  05/19/20 234 lb 12.8 oz (106.5 kg)  04/04/20 228 lb 3.2 oz (103.5 kg)     Vital signs reviewed  12/04/2020  - Note at rest 02 sats  97% on RA   General appearance:    Pleasant well preserved amb mod obese wm nad      HEENT : pt wearing mask not removed for exam due to covid -19 concerns.    NECK :  without JVD/Nodes/TM/ nl carotid upstrokes bilaterally   LUNGS: no acc muscle use,  Nl contour chest which is clear to A and P bilaterally without cough on insp or exp maneuvers   CV:  RRR  no s3 or murmur or increase in P2, and no edema   ABD:  soft and nontender with nl inspiratory excursion in the supine position. No bruits or organomegaly appreciated, bowel sounds nl  MS:  Nl gait/ ext warm without deformities, calf tenderness, cyanosis or clubbing No obvious joint restrictions   SKIN: warm and dry without lesions    NEURO:  alert, approp, nl sensorium with  no motor or cerebellar deficits apparent.         Assessment

## 2020-12-04 NOTE — Patient Instructions (Addendum)
Pantoprazole (protonix) 40 mg   Take  30-60 min before first meal of the day and Pepcid (famotidine)  20 mg one after supper until return to office - this is the best way to tell whether stomach acid is contributing to your problem.     GERD (REFLUX)  is an extremely common cause of respiratory symptoms just like yours , many times with no obvious heartburn at all.    It can be treated with medication, but also with lifestyle changes including elevation of the head of your bed (ideally with 6 -8inch blocks under the headboard of your bed),  Smoking cessation, avoidance of late meals, excessive alcohol, and avoid fatty foods, chocolate, peppermint, colas, red wine, and acidic juices such as orange juice.  NO MINT OR MENTHOL PRODUCTS SO NO COUGH DROPS  USE SUGARLESS CANDY INSTEAD (Jolley ranchers or Stover's or Life Savers) or even ice chips will also do - the key is to swallow to prevent all throat clearing. NO OIL BASED VITAMINS - use powdered substitutes.  Avoid fish oil when coughing.    Discuss your cpap mask with your cpap doctor.   Please remember to go to the lab department   for your tests - we will call you with the results when they are available.      Please schedule a follow up office visit in 6 weeks, call sooner if needed

## 2020-12-04 NOTE — Assessment & Plan Note (Signed)
Onset was 40's  > d'c acei 07/03/20 > improved but not gone 12/04/2020  - Allergy profile 12/04/2020 >  Eos 0. /  IgE  Pending  Upper airway cough syndrome (previously labeled PNDS),  is so named because it's frequently impossible to sort out how much is  CR/sinusitis with freq throat clearing (which can be related to primary GERD)   vs  causing  secondary (" extra esophageal")  GERD from wide swings in gastric pressure that occur with throat clearing, often  promoting self use of mint and menthol lozenges that reduce the lower esophageal sphincter tone and exacerbate the problem further in a cyclical fashion.   These are the same pts (now being labeled as having "irritable larynx syndrome" by some cough centers) who not infrequently have a history of having failed to tolerate ace inhibitors,  dry powder inhalers or biphosphonates or report having atypical/extraesophageal reflux symptoms that don't respond to standard doses of PPI  and are easily confused as having aecopd or asthma flares by even experienced allergists/ pulmonologists (myself included).    rec check allergy profile and rx max rx for gerd then return in 6 weeks

## 2020-12-05 LAB — IGE: IgE (Immunoglobulin E), Serum: 31 kU/L (ref <=114)

## 2020-12-20 ENCOUNTER — Other Ambulatory Visit: Payer: Self-pay | Admitting: Family Medicine

## 2021-01-01 DIAGNOSIS — L814 Other melanin hyperpigmentation: Secondary | ICD-10-CM | POA: Diagnosis not present

## 2021-01-01 DIAGNOSIS — L821 Other seborrheic keratosis: Secondary | ICD-10-CM | POA: Diagnosis not present

## 2021-01-01 DIAGNOSIS — Z85828 Personal history of other malignant neoplasm of skin: Secondary | ICD-10-CM | POA: Diagnosis not present

## 2021-01-01 DIAGNOSIS — D485 Neoplasm of uncertain behavior of skin: Secondary | ICD-10-CM | POA: Diagnosis not present

## 2021-01-01 DIAGNOSIS — C44329 Squamous cell carcinoma of skin of other parts of face: Secondary | ICD-10-CM | POA: Diagnosis not present

## 2021-01-01 DIAGNOSIS — L905 Scar conditions and fibrosis of skin: Secondary | ICD-10-CM | POA: Diagnosis not present

## 2021-01-01 DIAGNOSIS — L820 Inflamed seborrheic keratosis: Secondary | ICD-10-CM | POA: Diagnosis not present

## 2021-01-06 ENCOUNTER — Other Ambulatory Visit: Payer: Self-pay | Admitting: Family Medicine

## 2021-01-10 ENCOUNTER — Other Ambulatory Visit: Payer: Self-pay | Admitting: Family Medicine

## 2021-01-15 ENCOUNTER — Other Ambulatory Visit: Payer: Self-pay

## 2021-01-15 ENCOUNTER — Ambulatory Visit (INDEPENDENT_AMBULATORY_CARE_PROVIDER_SITE_OTHER): Payer: Medicare Other | Admitting: Internal Medicine

## 2021-01-15 ENCOUNTER — Encounter: Payer: Self-pay | Admitting: Internal Medicine

## 2021-01-15 DIAGNOSIS — R058 Other specified cough: Secondary | ICD-10-CM

## 2021-01-15 NOTE — Progress Notes (Signed)
Ricky Meyers, male    DOB: 04-08-1946,    MRN: [id number]   Brief patient profile:  53 yowm never smoker very active including eliptical x 40 min x 4 times weekly  Then:    Admit date: 06/15/2019 Discharge date: 06/18/2019  Admitted From: Home  Disposition:  Home    Brief/Interim Summary: 75 year old male who presented with dyspnea. He does have significant past medical history for hypertension, dyslipidemia and obesity. Reported several months of declining energy and decreased exercise tolerance. Worsening dyspnea on exertion to the point where he became dyspneic with minimal efforts. On his initial physical examination blood pressure 128/79, heart rate 91, respiratory rate 19, oxygen saturation 96%, moist mucous membranes, lungs clear to auscultation bilaterally, heart S1-S2 present and rhythmic, abdomen soft nontender, no lower extremity edema. Sodium 139, potassium 4.2, chloride 107, bicarb 22, glucose 118, BUN 24, creatinine 1.1, high sensitive troponin 259, white count 9.8, hemoglobin 13.2, hematocrit 39.3, platelets 170.  SARS COVID-19 was negative Chest x-ray negative for infiltrates. CT chest with acute bilateral lobar and segmental pulmonary emboli, involving all lobes of both lungs, positive right heart strain, with RV/LV ratio 1.9.EKG 108 bpm, right axis deviation, interventricular conduction delay, sinus rhythm, no ST segment changes, poor R wave progression, negative T waves in V5 to V6.  Patient was admitted to the hospital working diagnosis of acute submassive pulmonary embolism.   1.  Acute pulmonary embolism, submassive,/ acute left femoral and left posterior tibial veins deep vein thrombosis. Complicated with acute core pulmonale. Patient was admitted to the medical telemetry unit, he received anticoagulation with unfractionated IV heparin with good toleration.  His blood pressure remained stable, with systolic between 993 and 716 mmHG, his oxygenation was 95 to  97% on room air.  Further work-up with echocardiography showed a normal LV systolic function, ejection fraction 55 to 60%, the right ventricle systolic function was moderately reduced and the cavity was mildly enlarged.  It was noted left with bowing the interatrial septum, suggestive of elevated right atrial pressure.  Ultrasonography of his lower extremities showed acute deep vein thrombosis involving the left femoral vein and left posterior tibial veins.  Right lower extremity negative for deep vein thrombosis.  Patient was able to ambulate with physical therapy with no difficulties.  Patient has been transitioned to oral anticoagulation with rivaroxaban.  He will follow-up as an outpatient within 7 days.  2.  Hypertension.  Blood pressure remained well controlled, patient was continued on amlodipine, at discharge he will resume  3.  Dyslipidemia.  Continue statin therapy.  4.  Gout.  No acute flare, continue allopurinol.  5.  Depression.  Continue paroxetine.  6.  Obesity.  His calculated BMI is 30.5.     History of Present Illness  03/30/2020  Pulmonary/ 1st office eval/Ricky Meyers  Chief Complaint  Patient presents with   Consult    pt states  taking xaralto are clots dissolving in lungs. is it ok to exercise  Dyspnea:  MMRC1 = can walk nl pace, flat grade, can't hurry or go uphills or steps s sob but not back on eliptical yet   Cough: none  Sleep: flat / on side / one pillow  SABA use: none  rec Ok to reduce the naprosyn to once a day with meals if you don't need it for arthritis/joint pain  To get the most out of exercise      07/03/2020  f/u ov/Ricky Meyers re: PE/ dvt  Chief Complaint  Patient presents  with   Follow-up    Everything is going well.   Dyspnea:  [facility] = can walk nl pace, flat grade, can't hurry or go uphills or steps s sob  But not as good as he was prior to PE while going to gym at [employer]  Cough: slt raspy x years  Sleeping: on cpap/ does fine SABA use:  none  02: none  rec Ok to reduce the naprosyn to once a day with meals if you don't need it for arthritis/joint pain  To get the most out of exercise, you need to be continuously aware that you are short of breath, but never out of breath, for 30 minutes daily as a minimum. As you improve, it will actually be easier for you to do the same amount of exercise  in  30 minutes so always push to the level where you are short of breath.   Stop lisinopril and your throat issues should improve over several week Micardis 80-25  Start with one half daily for at least 6 weeks to see if it's the right strength and your throat congestion goes away Reduce the xarelto 10 mg daily  Please schedule a follow up visit in 3 months but call sooner if needed     12/04/2020  f/u ov/Ricky Meyers re:  PE/ dvt and chronic cough with assoc pnds better but but not gone  Chief Complaint  Patient presents with   Follow-up    Clearing throat with yellowish phlegm  Dyspnea:  MMRC1 = can walk nl pace, flat grade, can't hurry or go uphills or steps s sob   Cough: p stirring /voice use  Min actual mucus  Sleeping: pm cpap, bed flat no resp symptoms  SABA use: none  02: none  rec Pantoprazole (protonix) 40 mg   Take  30-60 min before first meal of the day and Pepcid (famotidine)  20 mg one after supper until return to office - this is the best way to tell whether stomach acid is contributing to your problem.   GERD diet  Discuss your cpap mask with your cpap doctor. Please remember to go to the lab department: neg allergy screen     01/15/2021  f/u ov/Ricky Meyers re: cough x years Chief Complaint  Patient presents with   Follow-up    Coughing up sputum  Dyspnea:  MMRC1 = can walk nl pace, flat grade, can't hurry or go uphills or steps s sob   Cough: p stirs/ voice makes it worse, more mucus = grey but nothing from nose  Sleeping: flat in bed / no trouble on cpap  SABA use: none  02: none  Covid status:   vax x 3    No  obvious day to day or daytime variability or assoc excess/ purulent sputum or mucus plugs or hemoptysis or cp or chest tightness, subjective wheeze or overt hb symptoms.   sleeping without nocturnal  or early am exacerbation  of respiratory  c/o's or need for noct saba. Also denies any obvious fluctuation of symptoms with weather or environmental changes or other aggravating or alleviating factors except as outlined above   No unusual exposure hx or h/o childhood pna/ asthma or knowledge of premature birth.  Current Allergies, Complete Past Medical History, Past Surgical History, Family History, and Social History were reviewed in [employer] record.  ROS  The following are not active complaints unless bolded Hoarseness, sore throat, dysphagia, dental problems, itching, sneezing,  nasal  congestion or discharge of excess mucus or purulent secretions, ear ache,   fever, chills, sweats, unintended wt loss or wt gain, classically pleuritic or exertional cp,  orthopnea pnd or arm/hand swelling  or leg swelling, presyncope, palpitations, abdominal pain, anorexia, nausea, vomiting, diarrhea  or change in bowel habits or change in bladder habits, change in stools or change in urine, dysuria, hematuria,  rash, arthralgias, visual complaints, headache, numbness, weakness or ataxia or problems with walking or coordination,  change in mood or  memory.        Current Meds  - - NOTE:   Unable to verify as accurately reflecting what pt takes     Medication Sig   allopurinol (ZYLOPRIM) 300 MG tablet TAKE 1 TABLET BY MOUTH EVERY DAY   amLODipine (NORVASC) 5 MG tablet TAKE 1 TABLET BY MOUTH EVERY DAY   atorvastatin (LIPITOR) 40 MG tablet TAKE 1 TABLET BY MOUTH EVERY DAY   cyclobenzaprine (FLEXERIL) 10 MG tablet TAKE 1 TABLET BY MOUTH 3 TIMES DAILY AS NEEDED FOR MUSCLE SPASMS   famotidine (PEPCID) 20 MG tablet One after supper   gabapentin (NEURONTIN) 100 MG capsule TAKE 1 CAPSULE BY  MOUTH 2 TIMES DAILY   [START ON 01/20/2021] oxyCODONE-acetaminophen (PERCOCET) 10-325 MG tablet Take 1 tablet by mouth every 6 (six) hours as needed for pain.   pantoprazole (PROTONIX) 40 MG tablet Take 1 tablet (40 mg total) by mouth daily. Take 30-60 min before first meal of the day   PARoxetine (PAXIL) 20 MG tablet TAKE 1 TABLET BY MOUTH EVERY MORNING   psyllium (METAMUCIL) 58.6 % packet Take 1 packet by mouth daily as needed (for constipation).    rivaroxaban (XARELTO) 10 MG TABS tablet Take 1 tablet (10 mg total) by mouth daily.   telmisartan-hydrochlorothiazide (MICARDIS HCT) 80-25 MG tablet Take 1 tablet by mouth daily.                      Past Medical History:  Diagnosis Date   Arthritis    neck and back    Chronic neck pain    Depression    ED (erectile dysfunction)    GERD (gastroesophageal reflux disease)    dysphagia   Gout    sees Dr. Leigh Aurora    Hyperlipidemia    Hypertension    OSA (obstructive sleep apnea) 12/18/2017   Severe with AHI at 70.1/hr and is now on CPAP at 11cm H2o   Prostate cancer [facility])    history of   Pulmonary embolism ([place]) 06/2019   Sleep apnea    snoring/never checked       Objective:    01/15/2021       234  12/04/2020       237  07/03/20 233 lb (105.7 kg)  05/19/20 234 lb 12.8 oz (106.5 kg)  04/04/20 228 lb 3.2 oz (103.5 kg)    Vital signs reviewed  01/15/2021  - Note at rest 02 sats  95% on RA   General appearance:    amb wm occ throat clearing   HEENT : pt wearing mask not removed for exam due to covid -19 concerns.    NECK :  without JVD/Nodes/TM/ nl carotid upstrokes bilaterally   LUNGS: no acc muscle use,  Nl contour chest which is clear to A and P bilaterally without cough on insp or exp maneuvers   CV:  RRR  no s3 or murmur or increase in P2, and no edema   ABD:  soft and nontender with nl inspiratory excursion in the supine position. No bruits or organomegaly appreciated, bowel sounds nl  MS:   Nl gait/ ext warm without deformities, calf tenderness, cyanosis or clubbing No obvious joint restrictions   SKIN: warm and dry without lesions    NEURO:  alert, approp, nl sensorium with  no motor or cerebellar deficits apparent.                 Assessment

## 2021-01-15 NOTE — Patient Instructions (Signed)
Please call me with the name of medication you are missing and sure our list is correct   If you are already taking gabapentin I will advise to adjust it before sending you a throat doctor.  GERD (REFLUX)  is an extremely common cause of respiratory symptoms just like yours , many times with no obvious heartburn at all.    It can be treated with medication, but also with lifestyle changes including elevation of the head of your bed (ideally with 6 -8inch blocks under the headboard of your bed),  Smoking cessation, avoidance of late meals, excessive alcohol, and avoid fatty foods, chocolate, peppermint, colas, red wine, and acidic juices such as orange juice.  NO MINT OR MENTHOL PRODUCTS SO NO COUGH DROPS  USE SUGARLESS CANDY INSTEAD (Jolley ranchers or Stover's or Life Savers) or even ice chips will also do - the key is to swallow to prevent all throat clearing. NO OIL BASED VITAMINS - use powdered substitutes.  Avoid fish oil when coughing.    Call me if you want to be referred to sleep medicine   Please schedule a follow up visit in 3 months but call sooner if needed - bring all medications

## 2021-01-16 ENCOUNTER — Encounter: Payer: Self-pay | Admitting: Internal Medicine

## 2021-01-16 NOTE — Assessment & Plan Note (Addendum)
Onset was 40's  > d'c acei 07/03/20 > improved but not gone 12/04/2020  - Allergy profile 12/04/2020 >  Eos 0.2 /  IgE 31   Upper airway cough syndrome (previously labeled PNDS),  is so named because it's frequently impossible to sort out how much is  CR/sinusitis with freq throat clearing (which can be related to primary GERD)   vs  causing  secondary (" extra esophageal")  GERD from wide swings in gastric pressure that occur with throat clearing, often  promoting self use of mint and menthol lozenges that reduce the lower esophageal sphincter tone and exacerbate the problem further in a cyclical fashion.   These are the same pts (now being labeled as having "irritable larynx syndrome" by some cough centers) who not infrequently have a history of having failed to tolerate ace inhibitors,  dry powder inhalers or biphosphonates or report having atypical/extraesophageal reflux symptoms that don't respond to standard doses of PPI  and are easily confused as having aecopd or asthma flares by even experienced allergists/ pulmonologists (myself included).   Had been on low doses of gabapentin but not sure if / when he stopped it and I would favor pushing to max of 300 mg qid for this longstanding cough prior to refer to ENT / Dr Marney Setting at [facility]  Discussed in detail all the  indications, usual  risks and alternatives  relative to the benefits with patient who agrees to proceed with Rx as outlined.        F/u at 3 m unless chronic f/u established with Dr Joya Gaskins.     Each maintenance medication was reviewed in detail including emphasizing most importantly the difference between maintenance and prns and under what circumstances the prns are to be triggered using an action plan format where appropriate.  Total time for H and P, chart review, counseling, reviewing and generating customized AVS unique to this office visit / same day charting = 22 min

## 2021-01-18 ENCOUNTER — Encounter: Payer: Self-pay | Admitting: Family Medicine

## 2021-01-18 ENCOUNTER — Other Ambulatory Visit: Payer: Self-pay

## 2021-01-18 MED ORDER — PAROXETINE HCL 20 MG PO TABS: 20.0000 mg | ORAL_TABLET | Freq: Every morning | ORAL | 0 refills | Status: DC

## 2021-01-18 MED ORDER — CYCLOBENZAPRINE HCL 10 MG PO TABS: 10.0000 mg | ORAL_TABLET | Freq: Three times a day (TID) | ORAL | 5 refills | Status: DC | PRN

## 2021-01-25 ENCOUNTER — Other Ambulatory Visit: Payer: Self-pay | Admitting: Family Medicine

## 2021-02-01 ENCOUNTER — Other Ambulatory Visit: Payer: Self-pay | Admitting: Internal Medicine

## 2021-02-01 DIAGNOSIS — R058 Other specified cough: Secondary | ICD-10-CM

## 2021-02-06 ENCOUNTER — Other Ambulatory Visit: Payer: Self-pay | Admitting: Family Medicine

## 2021-02-06 ENCOUNTER — Other Ambulatory Visit: Payer: Self-pay

## 2021-02-06 MED ORDER — ALLOPURINOL 300 MG PO TABS: 300.0000 mg | ORAL_TABLET | Freq: Every day | ORAL | 0 refills | Status: DC

## 2021-02-08 ENCOUNTER — Other Ambulatory Visit: Payer: Self-pay | Admitting: Family Medicine

## 2021-02-20 ENCOUNTER — Ambulatory Visit (INDEPENDENT_AMBULATORY_CARE_PROVIDER_SITE_OTHER): Payer: Medicare Other | Admitting: Family Medicine

## 2021-02-20 ENCOUNTER — Encounter: Payer: Self-pay | Admitting: Family Medicine

## 2021-02-20 ENCOUNTER — Other Ambulatory Visit: Payer: Self-pay

## 2021-02-20 VITALS — BP 124/82 | HR 64 | Temp 97.6°F | Wt 240.6 lb

## 2021-02-20 DIAGNOSIS — M159 Polyosteoarthritis, unspecified: Secondary | ICD-10-CM

## 2021-02-20 DIAGNOSIS — F1190 Opioid use, unspecified, uncomplicated: Secondary | ICD-10-CM

## 2021-02-20 MED ORDER — OXYCODONE-ACETAMINOPHEN 10-325 MG PO TABS: 1.0000 | ORAL_TABLET | Freq: Four times a day (QID) | ORAL | 0 refills | Status: DC | PRN

## 2021-02-20 NOTE — Progress Notes (Signed)
   Subjective:    Patient ID: Ricky Meyers, male    DOB: [birth year], 75 y.o.   MRN: [id number]  HPI Here for pain management. He is doing well.    Review of Systems     Objective:   Physical Exam        Assessment & Plan:  Pain management. Meds were refilled.  Indication for chronic opioid: OA Medication and dose: Percocet 10-325 # pills per month: 120 Last UDS date: 11-22-20 Opioid Treatment Agreement signed (Y/N): 01-15-19 Opioid Treatment Agreement last reviewed with patient:  02-20-21 [place] reviewed this encounter (include red flags): Yes Alysia Penna, MD

## 2021-02-26 ENCOUNTER — Telehealth: Payer: Self-pay

## 2021-02-26 DIAGNOSIS — D3131 Benign neoplasm of right choroid: Secondary | ICD-10-CM | POA: Diagnosis not present

## 2021-02-26 DIAGNOSIS — H25013 Cortical age-related cataract, bilateral: Secondary | ICD-10-CM | POA: Diagnosis not present

## 2021-02-26 DIAGNOSIS — H43813 Vitreous degeneration, bilateral: Secondary | ICD-10-CM | POA: Diagnosis not present

## 2021-02-26 DIAGNOSIS — H5203 Hypermetropia, bilateral: Secondary | ICD-10-CM | POA: Diagnosis not present

## 2021-02-26 DIAGNOSIS — H2513 Age-related nuclear cataract, bilateral: Secondary | ICD-10-CM | POA: Diagnosis not present

## 2021-02-26 NOTE — Chronic Care Management (AMB) (Signed)
Chronic Care Management Pharmacy Assistant   Name: Ricky Meyers  MRN: [id number] DOB: 03/12/1946   Reason for Encounter: Disease State/ General assessment call.   Conditions to be addressed/monitored: HTN   Recent office visits:  02/20/21 Alysia Penna MD (PCP) - seen for chronic narcotic use. Added oxycodone-acetaminophen 10-325mg  1 tablet every 6 hours as needed.  11/22/20 Alysia Penna MD (PCP) - seen for chronic narcotic use. No medication changes.    11/08/20 Willette Brace LPN (PCP) medicare annual wellness exam. Discontinued naproxen 500mg  and sildenafil citrate 100mg . Referral for colonoscopy, yearly eye exam and dental visit given. patient instructed to lose weight. Follow up in one year.    Recent consult visits:  01/15/21 Christinia Gully MD (pulmonology) - seen for upper airway cough syndrome. No medication changes. Patient instructed to follow up in 3 months unless follow up established with Dr. Joya Gaskins.  12/04/20 Christinia Gully MD (pulmonology) - seen for upper airway cough syndrome. Added famotidine 20mg  one after supper and pantoprazole sodium 40mg  daily, take 30 minutes before breakfast. Patient instructed to follow up in 6 weeks.   11/16/20 Monna Fam (ophthamology) routine eye exam and retina imaging. No medication changes.   Hospital visits:  None in previous 6 months  Medications: Outpatient Encounter Medications as of 02/26/2021  Medication Sig  . allopurinol (ZYLOPRIM) 300 MG tablet Take 1 tablet (300 mg total) by mouth daily.  Marland Kitchen amLODipine (NORVASC) 5 MG tablet TAKE 1 TABLET BY MOUTH EVERY DAY  . atorvastatin (LIPITOR) 40 MG tablet TAKE 1 TABLET BY MOUTH EVERY DAY  . cyclobenzaprine (FLEXERIL) 10 MG tablet Take 1 tablet (10 mg total) by mouth 3 (three) times daily as needed for muscle spasms.  . famotidine (PEPCID) 20 MG tablet One after supper  . gabapentin (NEURONTIN) 100 MG capsule TAKE 1 CAPSULE BY MOUTH 2 TIMES DAILY  . [START ON 04/22/2021]  oxyCODONE-acetaminophen (PERCOCET) 10-325 MG tablet Take 1 tablet by mouth every 6 (six) hours as needed for pain.  . pantoprazole (PROTONIX) 40 MG tablet TAKE 1 TABLET BY MOUTH EVERY DAY. TAKE 30-60 MINUTES BEFORE FIRST MEAL OF THE DAY  . PARoxetine (PAXIL) 20 MG tablet TAKE 1 TABLET BY MOUTH EVERY MORNING  . psyllium (METAMUCIL) 58.6 % packet Take 1 packet by mouth daily as needed (for constipation).   . rivaroxaban (XARELTO) 10 MG TABS tablet Take 1 tablet (10 mg total) by mouth daily.  Marland Kitchen telmisartan-hydrochlorothiazide (MICARDIS HCT) 80-25 MG tablet Take 1 tablet by mouth daily.   No facility-administered encounter medications on file as of 02/26/2021.   Reviewed chart prior to disease state call. Spoke with patient regarding BP  Recent Office Vitals: BP Readings from Last 3 Encounters:  02/20/21 124/82  01/15/21 122/74  12/04/20 118/70   Pulse Readings from Last 3 Encounters:  02/20/21 64  01/15/21 87  12/04/20 86    Wt Readings from Last 3 Encounters:  02/20/21 240 lb 9.6 oz (109.1 kg)  01/15/21 234 lb 3.2 oz (106.2 kg)  12/04/20 237 lb (107.5 kg)     Kidney Function Lab Results  Component Value Date/Time   CREATININE 1.20 (H) 08/22/2020 12:08 PM   CREATININE 1.03 07/03/2020 09:54 AM   CREATININE 1.19 06/17/2019 06:05 AM   GFR 70.13 07/03/2020 09:54 AM   GFRNONAA 59 (L) 06/17/2019 06:05 AM   GFRAA >60 06/17/2019 06:05 AM    BMP Latest Ref Rng & Units 08/22/2020 07/03/2020 06/17/2019  Glucose 65 - 99 mg/dL 106(H) 107(H) 103(H)  BUN 7 - 25 mg/dL 27(H) 18 19  Creatinine 0.70 - 1.18 mg/dL 1.20(H) 1.03 1.19  BUN/Creat Ratio 6 - 22 (calc) 23(H) - -  Sodium 135 - 146 mmol/L 139 137 140  Potassium 3.5 - 5.3 mmol/L 5.0 4.1 4.1  Chloride 98 - 110 mmol/L 103 103 105  CO2 20 - 32 mmol/L 30 25 28   Calcium 8.6 - 10.3 mg/dL 10.4(H) 10.0 9.4    . Current antihypertensive regimen:  o Amlodipine 5mg  - one tablet every day. o Telmisartan - hydrochlorothiazide 80/25mg  -  One  tablet daily.  . How often are you checking your Blood Pressure? Weekly   . Current home BP readings: Patient states his daughter keeps his blood pressure readings. Patient aware to have readings at next appointment with CPP.  Marland Kitchen What recent interventions/DTPs have been made by any provider to improve Blood Pressure control since last CPP Visit: None.   . Any recent hospitalizations or ED visits since last visit with CPP? No  . What diet changes have been made to improve Blood Pressure Control?  o Patient stated he has been eating less red meat and incorporating more salads and fruits into his diet. Patient stated he does love to eat a hot dog here and there.  . What exercise is being done to improve your Blood Pressure Control?  o Patient stated he plays golf twice weekly and just cut two trees down in his yard the other day.  Adherence Review: Is the patient currently on ACE/ARB medication? Yes Does the patient have >5 day gap between last estimated fill dates? No   Stephannie Peters, CMA   Star Rating Drugs:  Atorvastatin 40mg  - [facility] Telmisartan- Hydrochlorothiazide 80-25mg  - 02/08/21 [facility]  [place] [phone]

## 2021-02-27 ENCOUNTER — Encounter: Payer: Self-pay | Admitting: Family Medicine

## 2021-02-27 NOTE — Telephone Encounter (Signed)
I usually prescribe an antibiotic to cover for tick borne diseases. Call in Doxycycline 100 mg BID for 7 days

## 2021-02-28 ENCOUNTER — Other Ambulatory Visit: Payer: Self-pay

## 2021-02-28 MED ORDER — DOXYCYCLINE HYCLATE 100 MG PO TABS: 100.0000 mg | ORAL_TABLET | Freq: Two times a day (BID) | ORAL | 0 refills | Status: DC

## 2021-02-28 NOTE — Telephone Encounter (Cosign Needed)
Waiting for daughter Ricky Meyers to call back with BP readings to sign encounter.

## 2021-03-01 DIAGNOSIS — D0439 Carcinoma in situ of skin of other parts of face: Secondary | ICD-10-CM | POA: Diagnosis not present

## 2021-03-01 DIAGNOSIS — C44329 Squamous cell carcinoma of skin of other parts of face: Secondary | ICD-10-CM | POA: Diagnosis not present

## 2021-03-06 ENCOUNTER — Other Ambulatory Visit: Payer: Self-pay | Admitting: Family Medicine

## 2021-03-14 ENCOUNTER — Other Ambulatory Visit: Payer: Self-pay | Admitting: Family Medicine

## 2021-03-16 ENCOUNTER — Telehealth: Payer: Self-pay

## 2021-03-16 ENCOUNTER — Telehealth: Payer: Self-pay | Admitting: Pharmacist

## 2021-03-16 NOTE — Chronic Care Management (AMB) (Signed)
    Chronic Care Management Pharmacy Assistant   Name: NAMEER SUMMER  MRN: [id number] DOB: 1946/07/10  Spoke with daughter Izora Gala and reminded of her fathers appointment on Monday 03/19/21 at 8:30am by phone with Jeni Salles the clinical pharmacist. Shawna Clamp to make sure her father has all medications and his blood pressure readings close by and available for review. Izora Gala verbalized understanding and thanked me for my call.   [place] [phone]

## 2021-03-16 NOTE — Telephone Encounter (Signed)
Spoke with pt pharmacy advised that Dr Sarajane Jews approved pt Ricky Meyers for June, Pharmacy agent state that he will leave a note on pt chart to fill Oxycodone before 04/19/2021 due to pt travelling

## 2021-03-19 ENCOUNTER — Telehealth: Payer: Medicare Other

## 2021-03-19 NOTE — Progress Notes (Deleted)
Chronic Care Management Pharmacy Note  03/19/2021 Name:  Ricky Meyers MRN:  [id number] DOB:  11/04/1946  Subjective: Ricky Meyers is an 75 y.o. year old male who is a primary patient of [name], MD.  The CCM team was consulted for assistance with disease management and care coordination needs.    Engaged with patient by telephone for follow up visit in response to provider referral for pharmacy case management and/or care coordination services.   Consent to Services:  The patient was given information about Chronic Care Management services, agreed to services, and gave verbal consent prior to initiation of services.  Please see initial visit note for detailed documentation.   Patient Care Team: [name], MD as PCP - General Irish Lack Charlann Lange, MD as PCP - Cardiology (Cardiology) Viona Gilmore, [facility] as Pharmacist (Pharmacist)  Recent office visits: 02/20/21 Alysia Penna, MD: Patient presented for pain management visit. Percocet 10-325 mg refilled.  11/22/20 Alysia Penna, MD: Patient presented for pain management visit. Percocet 10-325 mg refilled.  11/08/20 Charlott Rakes, LPN: Patient presented for AWV.  Recent consult visits: 01/15/21 Christinia Gully, MD (pulmonary): Patient presented for upper airway cough.  12/04/20 Christinia Gully, MD (pulmonary): Patient presented for upper airway cough. Prescribed pantoprazole 40 mg before breakfast and famotidine 20 mg after dinner.  07/03/20 Christinia Gully, MD (pulmonary): Patient presented for PE follow up. Changed Xarelto to 10 mg daily. Switched lisinopril-HCTZ to telmisartan-HCTZ.  06/14/20 Sarajane Marek (ophthalmology): Patient presented for follow up visit. Unable to access notes.  Hospital visits: None in previous 6 months  Objective:  Lab Results  Component Value Date   CREATININE 1.20 (H) 08/22/2020   BUN 27 (H) 08/22/2020   GFR 70.13 07/03/2020   GFRNONAA 59 (L) 06/17/2019   GFRAA >60 06/17/2019   NA 139  08/22/2020   K 5.0 08/22/2020   CALCIUM 10.4 (H) 08/22/2020   CO2 30 08/22/2020   GLUCOSE 106 (H) 08/22/2020    Lab Results  Component Value Date/Time   GFR 70.13 07/03/2020 09:54 AM   GFR 77.54 07/15/2018 09:41 AM    Last diabetic Eye exam: No results found for: HMDIABEYEEXA  Last diabetic Foot exam: No results found for: HMDIABFOOTEX   Lab Results  Component Value Date   CHOL 180 08/22/2020   HDL 73 08/22/2020   LDLCALC 88 08/22/2020   LDLDIRECT 161.4 09/29/2012   TRIG 96 08/22/2020   CHOLHDL 2.5 08/22/2020    Hepatic Function Latest Ref Rng & Units 08/22/2020 07/15/2018 03/07/2016  Total Protein 6.1 - 8.1 g/dL 7.1 6.5 7.0  Albumin 3.5 - 5.2 g/dL - 4.1 4.5  AST 10 - 35 U/L _0 ALT 9 - 46 U/L _1 Alk Phosphatase 39 - 117 U/L - 61 68  Total Bilirubin 0.2 - 1.2 mg/dL 0.8 0.7 0.7  Bilirubin, Direct 0.0 - 0.2 mg/dL 0.2 0.1 0.2    Lab Results  Component Value Date/Time   TSH 1.21 08/22/2020 12:08 PM   TSH 2.01 07/15/2018 09:41 AM    CBC Latest Ref Rng & Units 12/04/2020 08/22/2020 07/03/2020  WBC 4.0 - 10.5 K/uL 5.9 6.5 9.6  Hemoglobin 13.0 - 17.0 g/dL 13.3 14.7 14.0  Hematocrit 39.0 - 52.0 % 38.8(L) 43.0 41.5  Platelets 150.0 - 400.0 K/uL 151.0 203 192.0    No results found for: VD25OH  Clinical ASCVD: {YES/NO:21197} The 10-year ASCVD risk score Mikey Bussing DC Jr., et al., 2013) is: 26.8%   Values  used to calculate the score:     Age: 31 years     Sex: Male     Is Non-Hispanic African American: No     Diabetic: No     Tobacco smoker: No     Systolic Blood Pressure: 867 mmHg     Is BP treated: Yes     HDL Cholesterol: 73 mg/dL     Total Cholesterol: 180 mg/dL    Depression screen [facility] 2/9 11/08/2020 08/22/2020 07/15/2018  Decreased Interest 0 0 0  Down, Depressed, Hopeless 0 0 0  PHQ - 2 Score 0 0 0  Altered sleeping - 0 -  Tired, decreased energy - 0 -  Change in appetite - 0 -  Feeling bad or failure about yourself  - 0 -  Trouble concentrating - 0 -   Moving slowly or fidgety/restless - 0 -  Suicidal thoughts - 0 -  PHQ-9 Score - 0 -     ***Other: (CHADS2VASc if Afib, MMRC or CAT for COPD, ACT, DEXA)  Social History   Tobacco Use  Smoking Status Never Smoker  Smokeless Tobacco Never Used   BP Readings from Last 3 Encounters:  02/20/21 124/82  01/15/21 122/74  12/04/20 118/70   Pulse Readings from Last 3 Encounters:  02/20/21 64  01/15/21 87  12/04/20 86   Wt Readings from Last 3 Encounters:  02/20/21 240 lb 9.6 oz (109.1 kg)  01/15/21 234 lb 3.2 oz (106.2 kg)  12/04/20 237 lb (107.5 kg)   BMI Readings from Last 3 Encounters:  02/20/21 32.63 kg/m  01/15/21 31.76 kg/m  12/04/20 32.14 kg/m    Assessment/Interventions: Review of patient past medical history, allergies, medications, health status, including review of consultants reports, laboratory and other test data, was performed as part of comprehensive evaluation and provision of chronic care management services.   SDOH:  (Social Determinants of Health) assessments and interventions performed: {yes/no:20286}  SDOH Screenings   Alcohol Screen: Not on file  Depression (PHQ2-9): Low Risk   . PHQ-2 Score: 0  Financial Resource Strain: Low Risk   . Difficulty of Paying Living Expenses: Not hard at all  Food Insecurity: No Food Insecurity  . Worried About Charity fundraiser in the Last Year: Never true  . Ran Out of Food in the Last Year: Never true  Housing: Low Risk   . Last Housing Risk Score: 0  Physical Activity: Insufficiently Active  . Days of Exercise per Week: 4 days  . Minutes of Exercise per Session: 20 min  Social Connections: Moderately Integrated  . Frequency of Communication with Friends and Family: More than three times a week  . Frequency of Social Gatherings with Friends and Family: Once a week  . Attends Religious Services: More than 4 times per year  . Active Member of Clubs or Organizations: Yes  . Attends Archivist  Meetings: 1 to 4 times per year  . Marital Status: Divorced  Stress: No Stress Concern Present  . Feeling of Stress : Not at all  Tobacco Use: Low Risk   . Smoking Tobacco Use: Never Smoker  . Smokeless Tobacco Use: Never Used  Transportation Needs: No Transportation Needs  . Lack of Transportation (Medical): No  . Lack of Transportation (Non-Medical): No    CCM Care Plan  No Known Allergies  Medications Reviewed Today    Reviewed by [name], MD (Physician) on 02/20/21 at [place] List Status: <None>  Medication Order Taking? Sig  Documenting Provider Last Dose Status Informant  allopurinol (ZYLOPRIM) 300 MG tablet [id number] Yes Take 1 tablet (300 mg total) by mouth daily. [name], MD Taking Active   amLODipine (NORVASC) 5 MG tablet [id number] Yes TAKE 1 TABLET BY MOUTH EVERY DAY [name], MD Taking Active   atorvastatin (LIPITOR) 40 MG tablet [id number] Yes TAKE 1 TABLET BY MOUTH EVERY DAY [name], MD Taking Active   cyclobenzaprine (FLEXERIL) 10 MG tablet [id number] Yes Take 1 tablet (10 mg total) by mouth 3 (three) times daily as needed for muscle spasms. [name], MD Taking Active   famotidine (PEPCID) 20 MG tablet [id number] Yes One after supper Tanda Rockers, MD Taking Active   gabapentin (NEURONTIN) 100 MG capsule [id number] Yes TAKE 1 CAPSULE BY MOUTH 2 TIMES DAILY [name], MD Taking Active   pantoprazole (PROTONIX) 40 MG tablet [id number] Yes TAKE 1 TABLET BY MOUTH EVERY DAY. TAKE 30-60 MINUTES BEFORE FIRST MEAL OF THE DAY Tanda Rockers, MD Taking Active   PARoxetine (PAXIL) 20 MG tablet [id number] Yes TAKE 1 TABLET BY MOUTH EVERY MORNING [name], MD Taking Active   psyllium (METAMUCIL) 58.6 % packet [id number] Yes Take 1 packet by mouth daily as needed (for constipation).  [provider] Taking Active Self  rivaroxaban (XARELTO) 10 MG TABS tablet [id number] Yes Take 1 tablet (10 mg total) by mouth daily. Tanda Rockers, MD  Taking Active   telmisartan-hydrochlorothiazide (MICARDIS HCT) 80-25 MG tablet [id number] Yes Take 1 tablet by mouth daily. Tanda Rockers, MD Taking Active           Patient Active Problem List   Diagnosis Date Noted  . Upper airway cough syndrome 12/04/2020  . Pulmonary embolus ([place]) 06/15/2019  . Pulmonary embolism ([place]) 06/15/2019  . Osteoarthritis 10/14/2018  . OSA (obstructive sleep apnea) 12/18/2017  . Obesity (BMI 30.0-34.9) 06/17/2017  . Snoring 06/17/2017  . LBBB (left bundle branch block) 10/22/2016  . DOE (dyspnea on exertion) 10/22/2016  . Ectopic beat, ventricular 10/22/2016  . Anemia, iron deficiency 12/03/2014  . Pseudogout of elbow 12/02/2014  . Arthritis 12/02/2014  . Hyponatremia 12/02/2014  . Constipation 12/02/2014  . Suspected Septic arthritis 12/02/2014  . Depression 03/04/2008  . Hyperlipemia 06/30/2007  . Gout 06/30/2007  . Essential hypertension 06/30/2007  . NECK PAIN 06/30/2007  . PROSTATE CANCER, HX OF 06/30/2007    Immunization History  Administered Date(s) Administered  . Fluad Quad(high Dose 65+) 08/22/2020  . Influenza Split 09/05/2011, 11/09/2012  . Influenza Whole 10/04/2009, 09/24/2010  . Influenza, High Dose Seasonal PF 07/17/2015, 09/01/2017, 07/15/2018, 07/28/2019  . Influenza,inj,Quad PF,6+ Mos 11/24/2013, 11/16/2014  . PFIZER(Purple Top)SARS-COV-2 Vaccination 11/19/2019, 12/10/2019, 09/26/2020  . Pneumococcal Conjugate-13 07/17/2015  . Pneumococcal Polysaccharide-23 10/04/2009  . Pneumococcal-Unspecified 09/03/2017  . Td 03/10/2009  . Tdap 07/18/2018  . Zoster 09/05/2011    Conditions to be addressed/monitored:  Hypertension, Hyperlipidemia, GERD, Depression, Gout and History of PE, Pain, ED  There are no care plans that you recently modified to display for this patient.   Current Barriers:  . {pharmacybarriers:24917}  Pharmacist Clinical Goal(s):  Marland Kitchen Patient will {PHARMACYGOALCHOICES:24921} through collaboration with  PharmD and provider.   Interventions: . 1:1 collaboration with [name], MD regarding development and update of comprehensive plan of care as evidenced by provider attestation and co-signature . Inter-disciplinary care team collaboration (see longitudinal plan of care) . Comprehensive medication review performed; medication list updated in electronic medical record  BP Readings  from Last 3 Encounters:  02/20/21 124/82  01/15/21 122/74  12/04/20 118/70    Hypertension (BP goal <140/90) -Controlled -Current treatment:  Telmisartan-HCTZ 80-25 1 tablet daily - in AM  Amlodipine 5 mg 1 tablet daily - in AM -Medications previously tried: lisinopril (cough)  -Current home readings: *** -Current dietary habits: ***  -Current exercise habits: *** -  did he buy the recumbent bike? -{ACTIONS;DENIES/REPORTS:21021675::"Denies"} hypotensive/hypertensive symptoms -Educated on {CCM BP Counseling:25124} -Counseled to monitor BP at home ***, document, and provide log at future appointments -{CCMPHARMDINTERVENTION:25122}  Lab Results  Component Value Date   CHOL 180 08/22/2020   HDL 73 08/22/2020   LDLCALC 88 08/22/2020   LDLDIRECT 161.4 09/29/2012   TRIG 96 08/22/2020   CHOLHDL 2.5 08/22/2020    Hyperlipidemia: (LDL goal < 100) -Controlled -Current treatment:  Atorvastatin 20 mg 1 tablet daily -Medications previously tried: none  -Current dietary patterns: *** -Current exercise habits: *** -Educated on {CCM HLD Counseling:25126} -{CCMPHARMDINTERVENTION:25122}   History of PE (Goal: ***) -{US controlled/uncontrolled:25276} -Current treatment   Xarelto 10 mg 1 tablet daily -Medications previously tried: none  -{CCMPHARMDINTERVENTION:25122} Patient receives medication for free as he does not have Part D insurance.   Depression/Anxiety (Goal: ***) -{US controlled/uncontrolled:25276} -Current treatment:  Paroxetine 20 mg 1 tablet every morning -Medications previously  tried/failed: none -PHQ9: *** -GAD7: *** -Connected with *** for mental health support -Educated on {CCM mental health counseling:25127} -{CCMPHARMDINTERVENTION:25122}  patient was unsure what he was talking this for; discussed that SSRIs are not always necessary long term Recommended to discuss with PCP about the need for this medication.  Lab Results  Component Value Date   LABURIC 4.9 08/22/2020    Gout (Goal: ***) -{US controlled/uncontrolled:25276} -Current treatment   Allopurinol 300 mg 1 tablet daily -Medications previously tried: ***  -{CCMPHARMDINTERVENTION:25122}   Arthritis/Pain/nerve pain (Goal: ***) -{US controlled/uncontrolled:25276} -Current treatment   Oxycodone-APAP 10-325 mg 1 tablet PRN - taking 2 a day  Naproxen 500 mg 1 tablet twice daily with meals PRN   Gabapentin 100 mg 1 capsule twice daily   Cyclobenzaprine 10 mg 1 table three times daily as needed - taking 2 times day -Medications previously tried: ***  -{CCMPHARMDINTERVENTION:25122}  ED (Goal: ***) -{US controlled/uncontrolled:25276} -Current treatment   Viagra 100 mg 1 tablet as needed -Medications previously tried: ***  -{CCMPHARMDINTERVENTION:25122}  Vaccines   Reviewed and discussed patient's vaccination history.     Health Maintenance -Vaccine gaps: shingrix -Current therapy:   Metamucil every othery day  -Educated on {ccm supplement counseling:25128} -{CCM Patient satisfied:25129} -{CCMPHARMDINTERVENTION:25122}   Patient Goals/Self-Care Activities . Patient will:  - {pharmacypatientgoals:24919}  Follow Up Plan: {CM FOLLOW UP XYBF:38329}   Medication Assistance: {MEDASSISTANCEINFO:25044}  Patient's preferred pharmacy is:  [facility] - [place], [place] - [address] [place] [address] Phone: [phone] Fax: [place] [address] [address] Phone: [phone] Fax:  [phone]  Uses pill box? {Yes or If no, why not?:20788} Pt endorses ***% compliance  We discussed: {Pharmacy options:24294} Patient decided to: {US Pharmacy Plan:23885}  Care Plan and Follow Up Patient Decision:  {FOLLOWUP:24991}  Plan: {CM FOLLOW UP PLAN:25073}  ***

## 2021-03-27 ENCOUNTER — Other Ambulatory Visit: Payer: Self-pay | Admitting: Family Medicine

## 2021-04-12 DIAGNOSIS — H25013 Cortical age-related cataract, bilateral: Secondary | ICD-10-CM | POA: Diagnosis not present

## 2021-04-12 DIAGNOSIS — H16141 Punctate keratitis, right eye: Secondary | ICD-10-CM | POA: Diagnosis not present

## 2021-04-17 ENCOUNTER — Ambulatory Visit: Payer: Medicare Other | Admitting: Internal Medicine

## 2021-04-23 ENCOUNTER — Other Ambulatory Visit: Payer: Self-pay | Admitting: Family Medicine

## 2021-05-11 ENCOUNTER — Ambulatory Visit: Payer: Medicare Other | Admitting: Internal Medicine

## 2021-05-16 ENCOUNTER — Other Ambulatory Visit: Payer: Self-pay | Admitting: Family Medicine

## 2021-05-16 NOTE — Telephone Encounter (Signed)
Last refill- 04/22/21--120 tabs, no refills

## 2021-05-16 NOTE — Telephone Encounter (Signed)
oxyCODONE-acetaminophen (PERCOCET) 10-325 MG tablet  [facility] [place], [place] - [address] Phone:  [phone]  Fax:  [phone]

## 2021-05-21 ENCOUNTER — Other Ambulatory Visit: Payer: Self-pay | Admitting: Family Medicine

## 2021-05-21 ENCOUNTER — Other Ambulatory Visit: Payer: Self-pay

## 2021-05-21 NOTE — Telephone Encounter (Signed)
Appointment scheduled 05/22/21

## 2021-05-22 ENCOUNTER — Encounter: Payer: Self-pay | Admitting: Family Medicine

## 2021-05-22 ENCOUNTER — Ambulatory Visit (INDEPENDENT_AMBULATORY_CARE_PROVIDER_SITE_OTHER): Payer: Medicare Other | Admitting: Family Medicine

## 2021-05-22 VITALS — BP 112/72 | HR 77 | Temp 98.0°F | Ht 72.0 in | Wt 240.8 lb

## 2021-05-22 DIAGNOSIS — F1190 Opioid use, unspecified, uncomplicated: Secondary | ICD-10-CM | POA: Diagnosis not present

## 2021-05-22 DIAGNOSIS — G8929 Other chronic pain: Secondary | ICD-10-CM

## 2021-05-22 DIAGNOSIS — M5442 Lumbago with sciatica, left side: Secondary | ICD-10-CM | POA: Diagnosis not present

## 2021-05-22 DIAGNOSIS — M159 Polyosteoarthritis, unspecified: Secondary | ICD-10-CM | POA: Diagnosis not present

## 2021-05-22 MED ORDER — OXYCODONE-ACETAMINOPHEN 10-325 MG PO TABS: 1.0000 | ORAL_TABLET | Freq: Four times a day (QID) | ORAL | 0 refills | Status: DC | PRN

## 2021-05-22 NOTE — Progress Notes (Signed)
   Subjective:    Patient ID: Ricky Meyers, male    DOB: Jul 21, 1946, 75 y.o.   MRN: [id number]  HPI Here for pain management, he is doing well.    Review of Systems  Constitutional: Negative.   Respiratory: Negative.    Cardiovascular: Negative.   Musculoskeletal:  Positive for arthralgias.      Objective:   Physical Exam Constitutional:      Appearance: Normal appearance.  Neurological:     Mental Status: He is alert.          Assessment & Plan:  Pain management. Indication for chronic opioid: OA Medication and dose: Percocet 10-325  # pills per month: 120 Last UDS date: 11-22-20 Opioid Treatment Agreement signed (Y/N): 01-15-19 Opioid Treatment Agreement last reviewed with patient:  05-22-21 NCCSRS reviewed this encounter (include red flags): Yes Meds were refilled.  Alysia Penna, MD

## 2021-06-05 ENCOUNTER — Ambulatory Visit (INDEPENDENT_AMBULATORY_CARE_PROVIDER_SITE_OTHER): Payer: Medicare Other | Admitting: Internal Medicine

## 2021-06-05 ENCOUNTER — Encounter: Payer: Self-pay | Admitting: Internal Medicine

## 2021-06-05 ENCOUNTER — Other Ambulatory Visit: Payer: Self-pay

## 2021-06-05 DIAGNOSIS — R058 Other specified cough: Secondary | ICD-10-CM | POA: Diagnosis not present

## 2021-06-05 DIAGNOSIS — G4733 Obstructive sleep apnea (adult) (pediatric): Secondary | ICD-10-CM | POA: Diagnosis not present

## 2021-06-05 DIAGNOSIS — I10 Essential (primary) hypertension: Secondary | ICD-10-CM

## 2021-06-05 NOTE — Patient Instructions (Addendum)
I would recommend that you gradually increase the gabapentin up to 100 mg 4 x daily until see  Dr Sharlene Motts to see if it helps the cough or the pain and discuss your gout/ your allopurinol and HCTC with Dr Winn Jock to stop the pantoprazole and pecid  We call to schedule a sinus CT and refer you to a sinus dr if need be   Pulmonary follow up is as needed

## 2021-06-05 NOTE — Assessment & Plan Note (Signed)
Onset was 40's  > d'c acei 07/03/20 > improved but not gone 12/04/2020 > d/c'd acei  And max rx for gerd  - Allergy profile 12/04/2020 >  Eos 0.2 /  IgE 31 - Sinus CT 06/05/2021 >>>    Now reports prior complications of dental surgery ? If his cough is not related to sinus dz so ok to try off gerd rx/ increase gabapentin to 100 mg qid and f/u ent prn   Max dose of gabapentin is 300 qid and also has chronic pain issues so for now suggested 100 qid and f/u titration with Dr Sharlene Motts.

## 2021-06-05 NOTE — Assessment & Plan Note (Signed)
Try off acei 07/03/2020 due to hoarseness / unexplained sob   Hoarseness and sob better but cough persists (see separate a/p)   Although even in retrospect it may not be clear the ACEi contributed to the pt's symptoms,  Pt improved off them and adding them back at this point or in the future would risk confusion in interpretation of non-specific respiratory symptoms to which this patient is prone  ie  Better not to muddy the waters here.   >>> f/u PCP re use of HCTZ as he also has gout on allopurinol and wants to stop the allopurinol

## 2021-06-05 NOTE — Progress Notes (Signed)
Ricky Meyers, male    DOB: 06-Mar-1946   MRN: [id number]   Brief patient profile:  21 yowm never smoker very active including eliptical x 40 min x 4 times weekly  Then:    Admit date: 06/15/2019 Discharge date: 06/18/2019   Admitted From: Home  Disposition:  Home      Brief/Interim Summary: 75 year old male who presented with dyspnea.  He does have significant past medical history for hypertension, dyslipidemia and obesity.  Reported several months of declining energy and decreased exercise tolerance.  Worsening dyspnea on exertion to the point where he became dyspneic with minimal efforts.  On his initial physical examination blood pressure 128/79, heart rate 91, respiratory rate 19, oxygen saturation 96%, moist mucous membranes, lungs clear to auscultation bilaterally, heart S1-S2 present and rhythmic, abdomen soft nontender, no lower extremity edema. Sodium 139, potassium 4.2, chloride 107, bicarb 22, glucose 118, BUN 24, creatinine 1.1, high sensitive troponin 259, white count 9.8, hemoglobin 13.2, hematocrit 39.3, platelets 170.  SARS COVID-19 was negative Chest x-ray negative for infiltrates.  CT chest with acute bilateral lobar and segmental pulmonary emboli, involving all lobes of both lungs, positive right heart strain, with RV/LV ratio 1.9.  EKG 108 bpm, right axis deviation, interventricular conduction delay, sinus rhythm, no ST segment changes, poor R wave progression, negative T waves in V5 to V6.   Patient was admitted to the hospital working diagnosis of acute submassive pulmonary embolism.    1.  Acute pulmonary embolism, submassive,/ acute left femoral and left posterior tibial veins deep vein thrombosis. Complicated with acute core pulmonale. Patient was admitted to the medical telemetry unit, he received anticoagulation with unfractionated IV heparin with good toleration.  His blood pressure remained stable, with systolic between [id number] and [id number] mmHG, his oxygenation was 95 to  97% on room air.  Further work-up with echocardiography showed a normal LV systolic function, ejection fraction 55 to 60%, the right ventricle systolic function was moderately reduced and the cavity was mildly enlarged.  It was noted left with bowing the interatrial septum, suggestive of elevated right atrial pressure.  Ultrasonography of his lower extremities showed acute deep vein thrombosis involving the left femoral vein and left posterior tibial veins.  Right lower extremity negative for deep vein thrombosis.  Patient was able to ambulate with physical therapy with no difficulties.  Patient has been transitioned to oral anticoagulation with rivaroxaban.  He will follow-up as an outpatient within 7 days.   2.  Hypertension.  Blood pressure remained well controlled, patient was continued on amlodipine, at discharge he will resume   3.  Dyslipidemia.  Continue statin therapy.   4.  Gout.  No acute flare, continue allopurinol.   5.  Depression.  Continue paroxetine.   6.  Obesity.  His calculated BMI is 30.5.        History of Present Illness  03/30/2020  Pulmonary/ 1st office eval/Ricky Meyers  Chief Complaint  Patient presents with   Consult    pt states  taking xaralto are clots dissolving in lungs. is it ok to exercise  Dyspnea:  MMRC1 = can walk nl pace, flat grade, can't hurry or go uphills or steps s sob but not back on eliptical yet   Cough: none  Sleep: flat / on side / one pillow  SABA use: none  rec Ok to reduce the naprosyn to once a day with meals if you don't need it for arthritis/joint pain  To get the most out of  exercise      07/03/2020  f/u ov/Ricky Meyers re: PE/ dvt  Chief Complaint  Patient presents with   Follow-up    Everything is going well.   Dyspnea:  [facility] = can walk nl pace, flat grade, can't hurry or go uphills or steps s sob  But not as good as he was prior to PE while going to gym at wt [employer]  Cough: slt raspy x years  Sleeping: on cpap/ does fine SABA use:  none  02: none  rec Ok to reduce the naprosyn to once a day with meals if you don't need it for arthritis/joint pain  To get the most out of exercise, you need to be continuously aware that you are short of breath, but never out of breath, for 30 minutes daily as a minimum. As you improve, it will actually be easier for you to do the same amount of exercise  in  30 minutes so always push to the level where you are short of breath.   Stop lisinopril and your throat issues should improve over several week Micardis 80-25  Start with one half daily for at least 6 weeks to see if it's the right strength and your throat congestion goes away Reduce the xarelto 10 mg daily  Please schedule a follow up visit in 3 months but call sooner if needed     12/04/2020  f/u ov/Ricky Meyers re:  PE/ dvt and chronic cough with assoc pnds better but but not gone  Chief Complaint  Patient presents with   Follow-up    Clearing throat with yellowish phlegm  Dyspnea:  MMRC1 = can walk nl pace, flat grade, can't hurry or go uphills or steps s sob   Cough: p stirring /voice use  Min actual mucus  Sleeping: pm cpap, bed flat no resp symptoms  SABA use: none  02: none  rec Pantoprazole (protonix) 40 mg   Take  30-60 min before first meal of the day and Pepcid (famotidine)  20 mg one after supper until return to office - this is the best way to tell whether stomach acid is contributing to your problem.   GERD diet  Discuss your cpap mask with your cpap doctor. Please remember to go to the lab department: neg allergy screen     01/15/2021  f/u ov/Ricky Meyers re: cough x years Chief Complaint  Patient presents with   Follow-up    Coughing up sputum  Dyspnea:  MMRC1 = can walk nl pace, flat grade, can't hurry or go uphills or steps s sob   Cough: p stirs/ voice makes it worse, more mucus = grey but nothing from nose  Sleeping: flat in bed / no trouble on cpap  SABA use: none  02: none  Covid status:   vax x 3  Rec Please  call me with the name of medication you are missing = lisinopril  If you are already taking gabapentin I will advise to adjust it before sending you a throat doctor. GERD diet reviewed, bed blocks rec  Call me if you want to be referred to sleep medicine Please schedule a follow up visit in 3 months but call sooner if needed - bring all medications    06/05/2021  f/u ov/Matei Magnone re:   cough  x age 37 no better with gerd rx on gabapentin 100 bid Chief Complaint  Patient presents with   Follow-up    Cough is unchanged since last visit. Cough is prod  with yellow sputum.    Dyspnea:  fine  Cough: mainly with voice use  Sleeping: able to sleep in flat bed on side and no cough SABA use: none  02: none Covid status:   x 3 vax   No obvious day to day or daytime variability or assoc   mucus plugs or hemoptysis or cp or chest tightness, subjective wheeze or overt  hb symptoms.   sleeping without nocturnal  or early am exacerbation  of respiratory  c/o's or need for noct saba. Also denies any obvious fluctuation of symptoms with weather or environmental changes or other aggravating or alleviating factors except as outlined above   No unusual exposure hx or h/o childhood pna/ asthma or knowledge of premature birth.  Current Allergies, Complete Past Medical History, Past Surgical History, Family History, and Social History were reviewed in [employer] record.  ROS  The following are not active complaints unless bolded Hoarseness, sore throat, dysphagia, dental problems, itching, sneezing,  nasal congestion or discharge of excess mucus or purulent secretions, ear ache,   fever, chills, sweats, unintended wt loss or wt gain, classically pleuritic or exertional cp,  orthopnea pnd or arm/hand swelling  or leg swelling, presyncope, palpitations, abdominal pain, anorexia, nausea, vomiting, diarrhea  or change in bowel habits or change in bladder habits, change in stools or change in urine,  dysuria, hematuria,  rash, arthralgias, visual complaints, headache, numbness, weakness or ataxia or problems with walking or coordination,  change in mood or  memory.        Current Meds  Medication Sig   allopurinol (ZYLOPRIM) 300 MG tablet Take 1 tablet (300 mg total) by mouth daily.   amLODipine (NORVASC) 5 MG tablet TAKE 1 TABLET BY MOUTH EVERY DAY   atorvastatin (LIPITOR) 40 MG tablet TAKE 1 TABLET BY MOUTH EVERY DAY   cyclobenzaprine (FLEXERIL) 10 MG tablet Take 1 tablet (10 mg total) by mouth 3 (three) times daily as needed for muscle spasms.   famotidine (PEPCID) 20 MG tablet One after supper   gabapentin (NEURONTIN) 100 MG capsule TAKE 1 CAPSULE BY MOUTH 2 TIMES DAILY   [START ON 07/23/2021] oxyCODONE-acetaminophen (PERCOCET) 10-325 MG tablet Take 1 tablet by mouth every 6 (six) hours as needed for pain.   pantoprazole (PROTONIX) 40 MG tablet TAKE 1 TABLET BY MOUTH EVERY DAY. TAKE 30-60 MINUTES BEFORE FIRST MEAL OF THE DAY   PARoxetine (PAXIL) 20 MG tablet TAKE 1 TABLET BY MOUTH EVERY MORNING   psyllium (METAMUCIL) 58.6 % packet Take 1 packet by mouth daily as needed (for constipation).    rivaroxaban (XARELTO) 10 MG TABS tablet Take 1 tablet (10 mg total) by mouth daily.   telmisartan-hydrochlorothiazide (MICARDIS HCT) 80-25 MG tablet Take 1 tablet by mouth daily.                         Past Medical History:  Diagnosis Date   Arthritis    neck and back    Chronic neck pain    Depression    ED (erectile dysfunction)    GERD (gastroesophageal reflux disease)    dysphagia   Gout    sees Dr. Leigh Aurora    Hyperlipidemia    Hypertension    OSA (obstructive sleep apnea) 12/18/2017   Severe with AHI at 70.1/hr and is now on CPAP at 11cm H2o   Prostate cancer [facility])    history of   Pulmonary embolism ([place]) 06/2019  Sleep apnea    snoring/never checked       Objective:     06/05/2021         238   01/15/2021       234  12/04/2020      237  07/03/20 233 lb  (105.7 kg)  05/19/20 234 lb 12.8 oz (106.5 kg)  04/04/20 228 lb 3.2 oz (103.5 kg)     Vital signs reviewed  06/05/2021  - Note at rest 02 sats  94% on RA   General appearance:    robust but easily confused/ frustrated wm nad   HEENT : pt wearing mask not removed for exam due to covid -19 concerns.    NECK :  without JVD/Nodes/TM/ nl carotid upstrokes bilaterally   LUNGS: no acc muscle use,  Nl contour chest which is clear to A and P bilaterally without cough on insp or exp maneuvers   CV:  RRR  no s3 or murmur or increase in P2, and no edema   ABD:  soft and nontender with nl inspiratory excursion in the supine position. No bruits or organomegaly appreciated, bowel sounds nl  MS:  Nl gait/ ext warm without deformities, calf tenderness, cyanosis or clubbing No obvious joint restrictions   SKIN: warm and dry without lesions    NEURO:  alert, approp, nl sensorium with  no motor or cerebellar deficits apparent.                Assessment

## 2021-06-05 NOTE — Assessment & Plan Note (Signed)
Try off acei 07/03/2020 due to hoarseness / unexplained sob   Not wearing due to mask > rec return to whoever prescribed it (I believe it was Dr Radford Pax) or see one of our sleep docs          Each maintenance medication was reviewed in detail including emphasizing most importantly the difference between maintenance and prns and under what circumstances the prns are to be triggered using an action plan format where appropriate.  Total time for H and P, chart review, counseling,  and generating customized AVS unique to this office visit / same day charting = 26 min

## 2021-06-20 ENCOUNTER — Other Ambulatory Visit: Payer: Self-pay

## 2021-06-20 ENCOUNTER — Ambulatory Visit
Admission: RE | Admit: 2021-06-20 | Discharge: 2021-06-20 | Disposition: A | Discharge status: Home / Self Care | Payer: Medicare Other | Source: Ambulatory Visit | Attending: Internal Medicine | Admitting: Internal Medicine

## 2021-06-20 DIAGNOSIS — R058 Other specified cough: Secondary | ICD-10-CM

## 2021-06-20 DIAGNOSIS — R519 Headache, unspecified: Secondary | ICD-10-CM | POA: Diagnosis not present

## 2021-06-20 DIAGNOSIS — R059 Cough, unspecified: Secondary | ICD-10-CM | POA: Diagnosis not present

## 2021-06-21 ENCOUNTER — Encounter: Payer: Self-pay | Admitting: *Deleted

## 2021-07-02 ENCOUNTER — Other Ambulatory Visit: Payer: Self-pay | Admitting: Internal Medicine

## 2021-07-02 ENCOUNTER — Other Ambulatory Visit: Payer: Self-pay | Admitting: Family Medicine

## 2021-07-04 ENCOUNTER — Encounter: Payer: Self-pay | Admitting: Family Medicine

## 2021-07-04 NOTE — Telephone Encounter (Signed)
I suggest he go to an urgent care in case he needs Xrays

## 2021-07-05 ENCOUNTER — Encounter: Payer: Self-pay | Admitting: Family Medicine

## 2021-07-05 DIAGNOSIS — M5442 Lumbago with sciatica, left side: Secondary | ICD-10-CM

## 2021-07-05 DIAGNOSIS — M5441 Lumbago with sciatica, right side: Secondary | ICD-10-CM

## 2021-07-05 NOTE — Telephone Encounter (Signed)
Lease advise.

## 2021-07-06 MED ORDER — DIAZEPAM 5 MG PO TABS: 5.0000 mg | ORAL_TABLET | Freq: Two times a day (BID) | ORAL | 0 refills | Status: DC | PRN

## 2021-07-06 NOTE — Telephone Encounter (Signed)
I ordered the MRI. They will contact him to schedule it

## 2021-07-06 NOTE — Telephone Encounter (Signed)
Please advise 

## 2021-07-06 NOTE — Telephone Encounter (Signed)
I sent in some Valium. He should try this at home first to see how it works

## 2021-07-13 ENCOUNTER — Other Ambulatory Visit: Payer: Self-pay | Admitting: Family Medicine

## 2021-07-15 ENCOUNTER — Other Ambulatory Visit: Payer: Self-pay

## 2021-07-15 ENCOUNTER — Ambulatory Visit
Admission: RE | Admit: 2021-07-15 | Discharge: 2021-07-15 | Disposition: A | Discharge status: Home / Self Care | Payer: Medicare Other | Source: Ambulatory Visit | Attending: Family Medicine | Admitting: Family Medicine

## 2021-07-15 DIAGNOSIS — M48061 Spinal stenosis, lumbar region without neurogenic claudication: Secondary | ICD-10-CM | POA: Diagnosis not present

## 2021-07-15 DIAGNOSIS — M5442 Lumbago with sciatica, left side: Secondary | ICD-10-CM

## 2021-07-15 DIAGNOSIS — M545 Low back pain, unspecified: Secondary | ICD-10-CM | POA: Diagnosis not present

## 2021-07-16 NOTE — Addendum Note (Signed)
Addended by: Alysia Penna A on: 07/16/2021 12:25 PM   Modules accepted: Orders

## 2021-07-19 DIAGNOSIS — I10 Essential (primary) hypertension: Secondary | ICD-10-CM | POA: Diagnosis not present

## 2021-07-19 DIAGNOSIS — M5416 Radiculopathy, lumbar region: Secondary | ICD-10-CM | POA: Diagnosis not present

## 2021-07-19 DIAGNOSIS — Z6832 Body mass index (BMI) 32.0-32.9, adult: Secondary | ICD-10-CM | POA: Diagnosis not present

## 2021-07-20 ENCOUNTER — Other Ambulatory Visit: Payer: Self-pay | Admitting: Family Medicine

## 2021-07-22 ENCOUNTER — Other Ambulatory Visit: Payer: Medicare Other

## 2021-07-23 ENCOUNTER — Other Ambulatory Visit: Payer: Self-pay | Admitting: Family Medicine

## 2021-07-23 ENCOUNTER — Other Ambulatory Visit: Payer: Self-pay | Admitting: Internal Medicine

## 2021-07-23 ENCOUNTER — Telehealth: Payer: Self-pay | Admitting: Internal Medicine

## 2021-07-23 DIAGNOSIS — R058 Other specified cough: Secondary | ICD-10-CM

## 2021-07-23 MED ORDER — GABAPENTIN 100 MG PO CAPS: 100.0000 mg | ORAL_CAPSULE | Freq: Four times a day (QID) | ORAL | 0 refills | Status: DC

## 2021-07-23 NOTE — Telephone Encounter (Signed)
Called and spoke with pt letting him know that we were going to send Rx for gabapentin to pharmacy for him. Stated to him after this Rx, PCP would need to take over any refills. Pt verbalized understanding. Verified preferred pharmacy and sent Rx in for pt. Nothing further needed.

## 2021-07-23 NOTE — Telephone Encounter (Signed)
That's fine

## 2021-07-23 NOTE — Telephone Encounter (Signed)
Patient Instructions by Tanda Rockers, MD at 06/05/2021 8:45 AM  Author: Tanda Rockers, MD Author Type: Physician Filed: 06/05/2021  9:11 AM  Note Status: Addendum Cosign: Cosign Not Required Encounter Date: 06/05/2021  Editor: Tanda Rockers, MD (Physician)      Prior Versions: 1. Tanda Rockers, MD (Physician) at 06/05/2021  9:09 AM - Addendum   2. Tanda Rockers, MD (Physician) at 06/05/2021  9:08 AM - Signed    I would recommend that you gradually increase the gabapentin up to 100 mg 4 x daily until see  Dr Sharlene Motts to see if it helps the cough or the pain and discuss your gout/ your allopurinol and HCTC with Dr Winn Jock to stop the pantoprazole and pecid   We call to schedule a sinus CT and refer you to a sinus dr if need be    Pulmonary follow up is as needed        Dr. Melvyn Novas, please advise if you would be okay with [place] sending a 1-time Rx of the gabapentin to the pharmacy for pt with instructions 4 times daily since pt was recently seen by you at the office?

## 2021-07-24 ENCOUNTER — Other Ambulatory Visit: Payer: Self-pay | Admitting: Family Medicine

## 2021-07-25 ENCOUNTER — Telehealth: Payer: Self-pay | Admitting: Internal Medicine

## 2021-07-25 NOTE — Telephone Encounter (Signed)
Cleared for surgery from a pulmonary perspective / no contraindication for general anesthesia  OSA rs is per Dr Fransico Him and not able to comment on that for surgery or post surgery questions

## 2021-07-25 NOTE — Telephone Encounter (Signed)
Fax received from Dr. Lenord Carbo to perform a LESI-left-transforaminal L3-L4 on patient.  Patient needs surgery clearance. Patient was just seen 06/05/2021 by Dr. Melvyn Novas. Office protocol is a risk assessment can be sent to surgeon if patient has been seen in 60 days or less.   Sending to Dr. Melvyn Novas for risk assessment or recommendations if patient needs to be seen in office prior to surgical procedure.

## 2021-07-26 ENCOUNTER — Encounter: Payer: Self-pay | Admitting: Family Medicine

## 2021-07-26 ENCOUNTER — Telehealth: Payer: Self-pay | Admitting: Family Medicine

## 2021-07-26 NOTE — Telephone Encounter (Signed)
Patient called stating that he currently takes Xarelto. He says that he was recently diagnosed with spinal stenosis and arthritis, and is needing a shot in his back to help with the extreme pain he is in.   He is needing the okay from Dr. Sarajane Jews to stop taking the xarelto three days before his appointment, which is scheduled for Tuesday.  He states that the office sent over a fax that is needing his signature.  Please advise.

## 2021-07-26 NOTE — Telephone Encounter (Signed)
OV notes and phone notes have been faxed back to [facility] for surgical clearance. Confirmation received. Nothing further needed at this time.

## 2021-07-27 MED ORDER — HYDROMORPHONE HCL 8 MG PO TABS: 8.0000 mg | ORAL_TABLET | ORAL | 0 refills | Status: DC | PRN

## 2021-07-27 NOTE — Telephone Encounter (Signed)
Set up another PMV and we will work out a plan

## 2021-07-27 NOTE — Telephone Encounter (Signed)
(  1) I sent in a small supply of Dilaudid that he can take on top of the Oxycodone as needed for pain, and (2) please create a note to send them that I okay stopping the Xarelto for 3 days prior to the epidural steroid injection

## 2021-07-27 NOTE — Telephone Encounter (Signed)
Please advise 

## 2021-07-30 ENCOUNTER — Other Ambulatory Visit: Payer: Self-pay

## 2021-07-30 ENCOUNTER — Telehealth: Payer: Self-pay | Admitting: Family Medicine

## 2021-07-30 ENCOUNTER — Ambulatory Visit (INDEPENDENT_AMBULATORY_CARE_PROVIDER_SITE_OTHER): Payer: Medicare Other | Admitting: Family Medicine

## 2021-07-30 ENCOUNTER — Encounter: Payer: Self-pay | Admitting: Family Medicine

## 2021-07-30 VITALS — BP 110/72 | HR 86 | Temp 98.4°F | Wt 239.0 lb

## 2021-07-30 DIAGNOSIS — F1190 Opioid use, unspecified, uncomplicated: Secondary | ICD-10-CM

## 2021-07-30 DIAGNOSIS — Z23 Encounter for immunization: Secondary | ICD-10-CM | POA: Diagnosis not present

## 2021-07-30 DIAGNOSIS — M159 Polyosteoarthritis, unspecified: Secondary | ICD-10-CM | POA: Diagnosis not present

## 2021-07-30 MED ORDER — HYDROMORPHONE HCL 8 MG PO TABS: 8.0000 mg | ORAL_TABLET | Freq: Three times a day (TID) | ORAL | 0 refills | Status: AC | PRN

## 2021-07-30 MED ORDER — OXYCODONE-ACETAMINOPHEN 10-325 MG PO TABS: 1.0000 | ORAL_TABLET | Freq: Four times a day (QID) | ORAL | 0 refills | Status: DC | PRN

## 2021-07-30 NOTE — Progress Notes (Signed)
   Subjective:    Patient ID: Ricky Meyers, male    DOB: 07/17/1946, 75 y.o.   MRN: [id number]  HPI Here for pain management. He has developed more severe low back pain, and last week we sent in a small supply of Dilaudid to add to his usual Percocet. This has worked well, and his pain is now tolerable. He is planning on getting an epidural steroid injection either this week or next week.    Review of Systems     Objective:   Physical Exam        Assessment & Plan:  Pain management. Indication for chronic opioid: OA  Medication and dose: Percocet 10-325 # pills per month: 120 Last UDS date: 11-22-20 Opioid Treatment Agreement signed (Y/N): 01-15-19 Opioid Treatment Agreement last reviewed with patient:  07-30-21 NCCSRS reviewed this encounter (include red flags): Yes We refilled the Percocet. We will also write for a 3o day supply of Dilaudid 8 mg to take TID as needed. Hopefull the ESI will be effective and he will be able tot stop the Dilaudid. Alysia Penna, MD

## 2021-07-30 NOTE — Telephone Encounter (Signed)
Pt form was placed in Dr Sarajane Jews folder for completion, will be faxed as soon as completed

## 2021-07-30 NOTE — Telephone Encounter (Signed)
PT spouse called to advise that the [facility] office has not received the paperwork that was suppose to be faxed over to [place]. It was mention during todays visit and its needed for them to be able to be tomorrow by Dr.Eichman at the available apt.

## 2021-07-31 ENCOUNTER — Other Ambulatory Visit: Payer: Self-pay | Admitting: Family Medicine

## 2021-08-01 NOTE — Telephone Encounter (Signed)
Pt form was completed and faxed to [facility]

## 2021-08-06 DIAGNOSIS — M5416 Radiculopathy, lumbar region: Secondary | ICD-10-CM | POA: Diagnosis not present

## 2021-08-13 ENCOUNTER — Ambulatory Visit (INDEPENDENT_AMBULATORY_CARE_PROVIDER_SITE_OTHER): Payer: Medicare Other | Admitting: Family Medicine

## 2021-08-13 ENCOUNTER — Encounter: Payer: Self-pay | Admitting: Family Medicine

## 2021-08-13 ENCOUNTER — Other Ambulatory Visit: Payer: Self-pay

## 2021-08-13 VITALS — BP 108/74 | HR 88 | Temp 98.4°F | Wt 237.0 lb

## 2021-08-13 DIAGNOSIS — M159 Polyosteoarthritis, unspecified: Secondary | ICD-10-CM

## 2021-08-13 NOTE — Progress Notes (Signed)
   Subjective:    Patient ID: Ricky Meyers, male    DOB: 1946-10-30, 75 y.o.   MRN: [id number]  HPI Here to follow up on low back pain. He had an epidural steroid injection to the lumbar spine on 08-06-21. Over the past few days he says it is beginning to take effect, and his pain has improved a bit. We had given him a supply of Dilaudid a few weeks ago to use in addition to his Percocet, but he has not had to use this for the past week. He had seen Dr. Leigh Aurora in the past for rheumatology care, and he asks for a referral to see him again.    Review of Systems  Constitutional: Negative.   Respiratory: Negative.    Cardiovascular: Negative.   Musculoskeletal:  Positive for arthralgias and back pain.      Objective:   Physical Exam Constitutional:      Appearance: Normal appearance.  Cardiovascular:     Rate and Rhythm: Normal rate and regular rhythm.     Pulses: Normal pulses.     Heart sounds: Normal heart sounds.  Pulmonary:     Effort: Pulmonary effort is normal.     Breath sounds: Normal breath sounds.  Neurological:     Mental Status: He is alert.          Assessment & Plan:  His back pain seems to be responding to the steroid injection, so we will see how he does in the next few weeks. We will also refer him back to Dr. Amil Amen for the OA. Alysia Penna, MD

## 2021-08-15 ENCOUNTER — Telehealth: Payer: Self-pay | Admitting: Pharmacist

## 2021-08-15 NOTE — Chronic Care Management (AMB) (Signed)
Chronic Care Management Pharmacy Assistant   Name: Ricky Meyers  MRN: [id number] DOB: 1946/03/30  Reason for Encounter: Disease State/ Hypertension Assessment Call.    Conditions to be addressed/monitored: HTN  Recent office visits:  08/13/21 Alysia Penna MD (PCP) - seen for osteoarthritis of multiple joints. Referral placed to rheumatology. No medication changes. No follow up noted.   07/30/21 Alysia Penna MD (PCP) - seen for chronic narcotic use.changed hydromorphone to every 8 hours from 4 hours. Start oxycodone-acetaminophen 10-325mg  every 6 hours as needed in December. No follow up noted.  05/22/21 seen for chronic narcotic use. Seen for chronic narcotic use. No medication changes. Referral placed to neurosurgery. No follow up noted.   Recent consult visits:  07/19/21 [name] Haggard PhD MD (Neurosurgery) - seen for lumbar radiculopathy and hypertension. No medication changes or follow up noted.  06/05/21 Christinia Gully MD (Pulmonology) - seen for upper airway cough syndrome. Discontinued doxycycline, famotidine and pantoprazole. Follow up as needed.  04/12/21 Hall Busing MD (Optometry) - seen for punctate keratitis right eye and cataract. No medication changes or follow up noted.   Hospital visits:  None in previous 6 months  Medications: Outpatient Encounter Medications as of 08/15/2021  Medication Sig   allopurinol (ZYLOPRIM) 300 MG tablet TAKE 1 TABLET BY MOUTH EVERY DAY   amLODipine (NORVASC) 5 MG tablet TAKE 1 TABLET BY MOUTH EVERY DAY   atorvastatin (LIPITOR) 40 MG tablet TAKE 1 TABLET BY MOUTH EVERY DAY   cyclobenzaprine (FLEXERIL) 10 MG tablet Take 1 tablet (10 mg total) by mouth 3 (three) times daily as needed for muscle spasms.   diazepam (VALIUM) 5 MG tablet Take 1 tablet (5 mg total) by mouth every 12 (twelve) hours as needed for anxiety.   gabapentin (NEURONTIN) 100 MG capsule Take 1 capsule (100 mg total) by mouth 4 (four) times daily.   HYDROmorphone  (DILAUDID) 8 MG tablet Take 1 tablet (8 mg total) by mouth every 8 (eight) hours as needed for severe pain.   [START ON 10/22/2021] oxyCODONE-acetaminophen (PERCOCET) 10-325 MG tablet Take 1 tablet by mouth every 6 (six) hours as needed for pain.   PARoxetine (PAXIL) 20 MG tablet TAKE 1 TABLET BY MOUTH EVERY MORNING   psyllium (METAMUCIL) 58.6 % packet Take 1 packet by mouth daily as needed (for constipation).    rivaroxaban (XARELTO) 10 MG TABS tablet Take 1 tablet (10 mg total) by mouth daily.   telmisartan-hydrochlorothiazide (MICARDIS HCT) 80-25 MG tablet TAKE 1 TABLET BY MOUTH EVERY DAY   XARELTO 20 MG TABS tablet TAKE 1 TABLET BY MOUTH EVERY DAY WITH SUPPER   No facility-administered encounter medications on file as of 08/15/2021.   Fill History: allopurinol 300 mg tablet 07/24/2021 90   atorvastatin 40 mg tablet 07/23/2021 90   cyclobenzaprine 10 mg tablet 07/19/2021 30   famotidine 20 mg tablet 07/23/2021 90   gabapentin 100 mg capsule 07/23/2021 30   paroxetine 20 mg tablet 07/23/2021 90   Xarelto 20 mg tablet 07/02/2021 90   telmisartan 80 mg-hydrochlorothiazide 25 mg tablet 07/23/2021 90   amlodipine 5 mg tablet 07/23/2021 90   diazepam 5 mg tablet 07/06/2021 15   oxycodone-acetaminophen 10 mg-325 mg tablet 07/23/2021 30   Reviewed chart prior to disease state call. Spoke with patient regarding BP  Recent Office Vitals: BP Readings from Last 3 Encounters:  08/13/21 108/74  07/30/21 110/72  06/05/21 104/66   Pulse Readings from Last 3 Encounters:  08/13/21 88  07/30/21 86  06/05/21 78    Wt Readings from Last 3 Encounters:  08/13/21 237 lb (107.5 kg)  07/30/21 239 lb (108.4 kg)  06/05/21 238 lb (108 kg)     Kidney Function Lab Results  Component Value Date/Time   CREATININE 1.20 (H) 08/22/2020 12:08 PM   CREATININE 1.03 07/03/2020 09:54 AM   CREATININE 1.19 06/17/2019 06:05 AM   GFR 70.13 07/03/2020 09:54 AM   GFRNONAA 59 (L) 06/17/2019 06:05 AM    GFRAA >60 06/17/2019 06:05 AM    BMP Latest Ref Rng & Units 08/22/2020 07/03/2020 06/17/2019  Glucose 65 - 99 mg/dL 106(H) 107(H) 103(H)  BUN 7 - 25 mg/dL 27(H) 18 19  Creatinine 0.70 - 1.18 mg/dL 1.20(H) 1.03 1.19  BUN/Creat Ratio 6 - 22 (calc) 23(H) - -  Sodium 135 - 146 mmol/L 139 137 140  Potassium 3.5 - 5.3 mmol/L 5.0 4.1 4.1  Chloride 98 - 110 mmol/L 103 103 105  CO2 20 - 32 mmol/L 30 25 28   Calcium 8.6 - 10.3 mg/dL 10.4(H) 10.0 9.4    Current antihypertensive regimen:  Amlodipine 5mg  - take 1 tablet by mouth everyday Telmisartan-hydrochlorothiazide 80-25mg  - take 1 tablet by mouth everyday. How often are you checking your Blood Pressure? infrequently Current home BP readings: None to report.  What recent interventions/DTPs have been made by any provider to improve Blood Pressure control since last CPP Visit: None. Any recent hospitalizations or ED visits since last visit with CPP? No  Adherence Review: Is the patient currently on ACE/ARB medication? Yes Does the patient have >5 day gap between last estimated fill dates? No  Notes: Spoke with patient and Izora Gala. Both stated that all medications listed are current and correct. Patient reports no issues with medications. Patient avoids foods high in sodium content and stays active per wife. Patient was scheduled for follow up appointment with Jeni Salles Pharm, D.  Care Gaps:  AWV - scheduled for 11/14/21 Covid-19 vaccine booster 4  - overdue  Zoster vaccines - overdue  Star Rating Drugs:  Atorvastatin 40mg  - last filled on 07/23/21 90DS at [place] Telmisartan - hydrochlorothiazide 80-25mg  - last filled on 07/23/21 90DS at [place] Pharmacist Assistant [phone]

## 2021-08-23 ENCOUNTER — Other Ambulatory Visit: Payer: Self-pay

## 2021-08-23 ENCOUNTER — Encounter: Payer: Self-pay | Admitting: Family Medicine

## 2021-08-23 ENCOUNTER — Ambulatory Visit (INDEPENDENT_AMBULATORY_CARE_PROVIDER_SITE_OTHER): Payer: Medicare Other | Admitting: Family Medicine

## 2021-08-23 VITALS — BP 92/74 | HR 86 | Temp 98.6°F | Ht 72.0 in | Wt 238.0 lb

## 2021-08-23 DIAGNOSIS — G4733 Obstructive sleep apnea (adult) (pediatric): Secondary | ICD-10-CM | POA: Diagnosis not present

## 2021-08-23 DIAGNOSIS — M109 Gout, unspecified: Secondary | ICD-10-CM

## 2021-08-23 DIAGNOSIS — I10 Essential (primary) hypertension: Secondary | ICD-10-CM

## 2021-08-23 DIAGNOSIS — I2694 Multiple subsegmental pulmonary emboli without acute cor pulmonale: Secondary | ICD-10-CM | POA: Diagnosis not present

## 2021-08-23 DIAGNOSIS — E782 Mixed hyperlipidemia: Secondary | ICD-10-CM | POA: Diagnosis not present

## 2021-08-23 DIAGNOSIS — K5900 Constipation, unspecified: Secondary | ICD-10-CM

## 2021-08-23 DIAGNOSIS — D509 Iron deficiency anemia, unspecified: Secondary | ICD-10-CM

## 2021-08-23 DIAGNOSIS — Z8546 Personal history of malignant neoplasm of prostate: Secondary | ICD-10-CM | POA: Diagnosis not present

## 2021-08-23 DIAGNOSIS — R739 Hyperglycemia, unspecified: Secondary | ICD-10-CM

## 2021-08-23 LAB — BASIC METABOLIC PANEL
BUN: 20 mg/dL (ref 6–23)
CO2: 29 mEq/L (ref 19–32)
Calcium: 9.6 mg/dL (ref 8.4–10.5)
Chloride: 100 mEq/L (ref 96–112)
Creatinine, Ser: 1.25 mg/dL (ref 0.40–1.50)
GFR: 55.52 mL/min — ABNORMAL LOW (ref >60.00)
Glucose, Bld: 94 mg/dL (ref 70–99)
Potassium: 3.8 mEq/L (ref 3.5–5.1)
Sodium: 138 mEq/L (ref 135–145)

## 2021-08-23 LAB — LIPID PANEL
Cholesterol: 153 mg/dL (ref 0–200)
HDL: 65.2 mg/dL (ref >39.00)
LDL Cholesterol: 71 mg/dL (ref 0–99)
NonHDL: 87.69
Total CHOL/HDL Ratio: 2
Triglycerides: 84 mg/dL (ref 0.0–149.0)
VLDL: 16.8 mg/dL (ref 0.0–40.0)

## 2021-08-23 LAB — CBC WITH DIFFERENTIAL/PLATELET
Basophils Absolute: 0 10*3/uL (ref 0.0–0.1)
Basophils Relative: 0.4 % (ref 0.0–3.0)
Eosinophils Absolute: 0.2 10*3/uL (ref 0.0–0.7)
Eosinophils Relative: 2 % (ref 0.0–5.0)
HCT: 39.1 % (ref 39.0–52.0)
Hemoglobin: 13.3 g/dL (ref 13.0–17.0)
Lymphocytes Relative: 14.9 % (ref 12.0–46.0)
Lymphs Abs: 1.2 10*3/uL (ref 0.7–4.0)
MCHC: 33.9 g/dL (ref 30.0–36.0)
MCV: 94.9 fl (ref 78.0–100.0)
Monocytes Absolute: 1.3 10*3/uL — ABNORMAL HIGH (ref 0.1–1.0)
Monocytes Relative: 15.8 % — ABNORMAL HIGH (ref 3.0–12.0)
Neutro Abs: 5.5 10*3/uL (ref 1.4–7.7)
Neutrophils Relative %: 66.9 % (ref 43.0–77.0)
Platelets: 178 10*3/uL (ref 150.0–400.0)
RBC: 4.13 Mil/uL — ABNORMAL LOW (ref 4.22–5.81)
RDW: 14.3 % (ref 11.5–15.5)
WBC: 8.3 10*3/uL (ref 4.0–10.5)

## 2021-08-23 LAB — TSH: TSH: 1.32 u[IU]/mL (ref 0.35–5.50)

## 2021-08-23 LAB — HEPATIC FUNCTION PANEL
ALT: 15 U/L (ref 0–53)
AST: 17 U/L (ref 0–37)
Albumin: 4.2 g/dL (ref 3.5–5.2)
Alkaline Phosphatase: 58 U/L (ref 39–117)
Bilirubin, Direct: 0.2 mg/dL (ref 0.0–0.3)
Total Bilirubin: 1.1 mg/dL (ref 0.2–1.2)
Total Protein: 6.7 g/dL (ref 6.0–8.3)

## 2021-08-23 LAB — HEMOGLOBIN A1C: Hgb A1c MFr Bld: 5.6 % (ref 4.6–6.5)

## 2021-08-23 LAB — PSA: PSA: 0.02 ng/mL — ABNORMAL LOW (ref 0.10–4.00)

## 2021-08-23 LAB — URIC ACID: Uric Acid, Serum: 4.2 mg/dL (ref 4.0–7.8)

## 2021-08-23 MED ORDER — GABAPENTIN 100 MG PO CAPS: 100.0000 mg | ORAL_CAPSULE | Freq: Four times a day (QID) | ORAL | 5 refills | Status: DC

## 2021-08-23 NOTE — Progress Notes (Signed)
Subjective:    Patient ID: Ricky Meyers, male    DOB: Jun 05, 1946, 75 y.o.   MRN: [id number]  HPI Here to follow up on issues. Over the past 6 months his BP has been running a bit low. This has not caused him to feel bad. He has chronic low back pain, and he says he can no longer exercise the way he used to. His gout has been stable. His sleep apnea is stable.    Review of Systems  Constitutional: Negative.   HENT: Negative.    Eyes: Negative.   Respiratory: Negative.    Cardiovascular: Negative.   Gastrointestinal: Negative.   Genitourinary: Negative.   Musculoskeletal:  Positive for back pain.  Skin: Negative.   Neurological: Negative.   Psychiatric/Behavioral: Negative.        Objective:   Physical Exam Constitutional:      General: He is not in acute distress.    Appearance: Normal appearance. He is well-developed. He is not diaphoretic.  HENT:     Head: Normocephalic and atraumatic.     Right Ear: External ear normal.     Left Ear: External ear normal.     Nose: Nose normal.     Mouth/Throat:     Pharynx: No oropharyngeal exudate.  Eyes:     General: No scleral icterus.       Right eye: No discharge.        Left eye: No discharge.     Conjunctiva/sclera: Conjunctivae normal.     Pupils: Pupils are equal, round, and reactive to light.  Neck:     Thyroid: No thyromegaly.     Vascular: No JVD.     Trachea: No tracheal deviation.  Cardiovascular:     Rate and Rhythm: Normal rate and regular rhythm.     Heart sounds: Normal heart sounds. No murmur heard.   No friction rub. No gallop.  Pulmonary:     Effort: Pulmonary effort is normal. No respiratory distress.     Breath sounds: Normal breath sounds. No wheezing or rales.  Chest:     Chest wall: No tenderness.  Abdominal:     General: Bowel sounds are normal. There is no distension.     Palpations: Abdomen is soft. There is no mass.     Tenderness: There is no abdominal tenderness. There is no guarding or  rebound.  Genitourinary:    Penis: Normal. No tenderness.      Testes: Normal.     Rectum: Normal.  Musculoskeletal:        General: No tenderness. Normal range of motion.     Cervical back: Neck supple.  Lymphadenopathy:     Cervical: No cervical adenopathy.  Skin:    General: Skin is warm and dry.     Coloration: Skin is not pale.     Findings: No erythema or rash.  Neurological:     Mental Status: He is alert and oriented to person, place, and time.     Cranial Nerves: No cranial nerve deficit.     Motor: No abnormal muscle tone.     Coordination: Coordination normal.     Deep Tendon Reflexes: Reflexes are normal and symmetric. Reflexes normal.  Psychiatric:        Behavior: Behavior normal.        Thought Content: Thought content normal.        Judgment: Judgment normal.          Assessment & Plan:  His  HTN is over treated, so we will stop the Amlodipine. He will follow the BP at home and report back in 3-4 weeks. We will get fasting labs to check lipids, etc. I suggested he try water aerobics or other exercises in the pool, since he belings to the [facility] His gout is stable. We spent a total of ( 33  ) minutes reviewing records and discussing these issues.  Alysia Penna, MD

## 2021-08-27 ENCOUNTER — Encounter: Payer: Self-pay | Admitting: Family Medicine

## 2021-08-27 NOTE — Telephone Encounter (Signed)
The new RX is ready

## 2021-08-30 DIAGNOSIS — Z6831 Body mass index (BMI) 31.0-31.9, adult: Secondary | ICD-10-CM | POA: Diagnosis not present

## 2021-08-30 DIAGNOSIS — M5416 Radiculopathy, lumbar region: Secondary | ICD-10-CM | POA: Diagnosis not present

## 2021-08-30 NOTE — Telephone Encounter (Signed)
Pt Rx was faxed to [facility] supply, copy sent to scanning

## 2021-08-31 ENCOUNTER — Other Ambulatory Visit: Payer: Self-pay | Admitting: Family Medicine

## 2021-09-11 NOTE — Telephone Encounter (Signed)
The last RX we sent in already said "bariatric" on it

## 2021-09-17 DIAGNOSIS — M5416 Radiculopathy, lumbar region: Secondary | ICD-10-CM | POA: Diagnosis not present

## 2021-09-20 NOTE — Telephone Encounter (Signed)
I wrote a new RX that he can come pick up

## 2021-09-26 NOTE — Telephone Encounter (Signed)
Pt picked up Rx for Rollator walker from the office

## 2021-10-05 ENCOUNTER — Other Ambulatory Visit: Payer: Self-pay

## 2021-10-05 ENCOUNTER — Telehealth: Payer: Self-pay | Admitting: Family Medicine

## 2021-10-05 NOTE — Telephone Encounter (Signed)
[name] from the pharmacy call and want to know if dr.Fry have any samples of Xarelto 20 mg that the pt can get until his program is renewed  for 10 tab it cost $220:00 Ricky Meyers want a call back at [phone].

## 2021-10-08 ENCOUNTER — Other Ambulatory Visit: Payer: Self-pay

## 2021-10-08 MED ORDER — RIVAROXABAN 20 MG PO TABS: 20.0000 mg | ORAL_TABLET | Freq: Every day | ORAL | 0 refills | Status: DC

## 2021-10-08 NOTE — Telephone Encounter (Signed)
Hey! Do you know if any Xarelto samples are available?

## 2021-10-08 NOTE — Telephone Encounter (Signed)
Maybe enough to last until January 1st if we can

## 2021-10-08 NOTE — Telephone Encounter (Signed)
Please advise if any samples are available.

## 2021-10-18 ENCOUNTER — Other Ambulatory Visit: Payer: Self-pay | Admitting: Family Medicine

## 2021-10-18 ENCOUNTER — Telehealth: Payer: Self-pay | Admitting: Pharmacist

## 2021-10-18 NOTE — Chronic Care Management (AMB) (Signed)
Chronic Care Management Pharmacy Assistant   Name: Ricky Meyers  MRN: [id number] DOB: 06-08-1946  Reason for Encounter: Disease State /Hypertension Assessment Call   Conditions to be addressed/monitored: HTN  Recent office visits:  08/23/2021 Alysia Penna MD - Patient was seen for essential hypertension and additional issues. Discontinued Amlodipine. No follow up noted.  Recent consult visits:  08/30/2021 Glenford Peers NP (Neurosurgery) - Patient was seen for lumbar radiculopathy and an additional issue. No medication changes noted. No follow up noted.  Hospital visits: None   Medications: Outpatient Encounter Medications as of 10/18/2021  Medication Sig   allopurinol (ZYLOPRIM) 300 MG tablet TAKE 1 TABLET BY MOUTH EVERY DAY   atorvastatin (LIPITOR) 40 MG tablet TAKE 1 TABLET BY MOUTH EVERY DAY   cyclobenzaprine (FLEXERIL) 10 MG tablet Take 1 tablet (10 mg total) by mouth 3 (three) times daily as needed for muscle spasms.   diazepam (VALIUM) 5 MG tablet Take 1 tablet (5 mg total) by mouth every 12 (twelve) hours as needed for anxiety.   gabapentin (NEURONTIN) 100 MG capsule Take 1 capsule (100 mg total) by mouth 4 (four) times daily.   [START ON 10/22/2021] oxyCODONE-acetaminophen (PERCOCET) 10-325 MG tablet Take 1 tablet by mouth every 6 (six) hours as needed for pain.   PARoxetine (PAXIL) 20 MG tablet TAKE 1 TABLET BY MOUTH EVERY MORNING   psyllium (METAMUCIL) 58.6 % packet Take 1 packet by mouth daily as needed (for constipation).    rivaroxaban (XARELTO) 20 MG TABS tablet Take 1 tablet (20 mg total) by mouth daily with supper.   telmisartan-hydrochlorothiazide (MICARDIS HCT) 80-25 MG tablet TAKE 1 TABLET BY MOUTH EVERY DAY   XARELTO 20 MG TABS tablet TAKE 1 TABLET BY MOUTH EVERY DAY WITH SUPPER   No facility-administered encounter medications on file as of 10/18/2021.  Fill History: allopurinol 300 mg tablet 07/24/2021 90   atorvastatin 40 mg tablet 07/23/2021 90    cyclobenzaprine 10 mg tablet 08/31/2021 30   famotidine 20 mg tablet 07/23/2021 90   gabapentin 100 mg capsule 08/23/2021 30   paroxetine 20 mg tablet 07/23/2021 90   Xarelto 20 mg tablet 07/02/2021 90   telmisartan 80 mg-hydrochlorothiazide 25 mg tablet 07/23/2021 90   oxycodone-acetaminophen 10 mg-325 mg tablet 09/22/2021 30   Reviewed chart prior to disease state call. Spoke with patient regarding BP  Recent Office Vitals: BP Readings from Last 3 Encounters:  08/23/21 92/74  08/13/21 108/74  07/30/21 110/72   Pulse Readings from Last 3 Encounters:  08/23/21 86  08/13/21 88  07/30/21 86    Wt Readings from Last 3 Encounters:  08/23/21 238 lb (108 kg)  08/13/21 237 lb (107.5 kg)  07/30/21 239 lb (108.4 kg)     Kidney Function Lab Results  Component Value Date/Time   CREATININE 1.25 08/23/2021 09:40 AM   CREATININE 1.20 (H) 08/22/2020 12:08 PM   CREATININE 1.03 07/03/2020 09:54 AM   GFR 55.52 (L) 08/23/2021 09:40 AM   GFRNONAA 59 (L) 06/17/2019 06:05 AM   GFRAA >60 06/17/2019 06:05 AM    BMP Latest Ref Rng & Units 08/23/2021 08/22/2020 07/03/2020  Glucose 70 - 99 mg/dL 94 106(H) 107(H)  BUN 6 - 23 mg/dL 20 27(H) 18  Creatinine 0.40 - 1.50 mg/dL 1.25 1.20(H) 1.03  BUN/Creat Ratio 6 - 22 (calc) - 23(H) -  Sodium 135 - 145 mEq/L 138 139 137  Potassium 3.5 - 5.1 mEq/L 3.8 5.0 4.1  Chloride 96 - 112 mEq/L 100 103  103  CO2 19 - 32 mEq/L 29 30 25   Calcium 8.4 - 10.5 mg/dL 9.6 10.4(H) 10.0    Current antihypertensive regimen:  Telmisartan-hydrochlorothiazide 80-25mg  - take 1 tablet by mouth everyday.  How often are you checking your Blood Pressure? 1-2x per week  Current home BP readings: Patient states his last reading was 122/74, he was driving and not at home to find his previous readings however, they have been in the same range.   What recent interventions/DTPs have been made by any provider to improve Blood Pressure control since last CPP Visit: Patient  states no changes at this time.  Any recent hospitalizations or ED visits since last visit with CPP? No  What diet changes have been made to improve Blood Pressure Control?  No dietary changes, patient avoids high sodium foods. Patient states he generally eats meats, vegetables, starch and loves fruit.  What exercise is being done to improve your Blood Pressure Control?  Patient states he has spinal stenosis and is not able to do much at this time.  Adherence Review: Is the patient currently on ACE/ARB medication? Yes Does the patient have >5 day gap between last estimated fill dates? No  Notes: Spoke with patient about PAP for Xarelto, he states he thought he turned in the paperwork however is unsure. He is not home at this time and will check when he gets home and call Izora Gala, at Dr. Barbie Banner office.  Patient states Izora Gala was helping him with this.  Care Gaps: AWV - scheduled for 11/14/21 Last BP - 92/74 on 08/23/2021 Covid-19 vaccine - overdue  Zoster vaccines - overdue  Star Rating Drugs: Atorvastatin 40mg  - last filled on 07/23/21 90DS at [place] Telmisartan - hydrochlorothiazide 80-25mg  - last filled on 07/23/21 90DS at [place] [phone]

## 2021-10-19 ENCOUNTER — Other Ambulatory Visit: Payer: Self-pay

## 2021-10-19 MED ORDER — PAROXETINE HCL 20 MG PO TABS: 20.0000 mg | ORAL_TABLET | Freq: Every morning | ORAL | 0 refills | Status: DC

## 2021-10-19 MED ORDER — ATORVASTATIN CALCIUM 40 MG PO TABS: 40.0000 mg | ORAL_TABLET | Freq: Every day | ORAL | 0 refills | Status: DC

## 2021-10-19 MED ORDER — ALLOPURINOL 300 MG PO TABS: 300.0000 mg | ORAL_TABLET | Freq: Every day | ORAL | 0 refills | Status: DC

## 2021-10-22 ENCOUNTER — Other Ambulatory Visit: Payer: Self-pay | Admitting: Internal Medicine

## 2021-10-22 ENCOUNTER — Other Ambulatory Visit: Payer: Self-pay | Admitting: Family Medicine

## 2021-10-22 DIAGNOSIS — R058 Other specified cough: Secondary | ICD-10-CM

## 2021-10-23 ENCOUNTER — Other Ambulatory Visit: Payer: Self-pay | Admitting: Family Medicine

## 2021-10-23 DIAGNOSIS — M5416 Radiculopathy, lumbar region: Secondary | ICD-10-CM | POA: Diagnosis not present

## 2021-10-23 DIAGNOSIS — Z6832 Body mass index (BMI) 32.0-32.9, adult: Secondary | ICD-10-CM | POA: Diagnosis not present

## 2021-10-24 ENCOUNTER — Other Ambulatory Visit: Payer: Self-pay | Admitting: Neurological Surgery

## 2021-10-26 ENCOUNTER — Telehealth: Payer: Self-pay | Admitting: Family Medicine

## 2021-10-26 NOTE — Telephone Encounter (Signed)
Shirlean Mylar called from [facility] and [employer] regarding a prescription for XARELTO 20 MG TABS tablet for the patient.  She states that the prescription reads "daily" only and is needing the prescription to say "one daily".  Fax number for the prescription is [phone].  Please advise.

## 2021-10-26 NOTE — Telephone Encounter (Signed)
Ricky Meyers called to follow up on application sent in. Ricky Meyers needs clarification on prescription to say "one daily", on the provider page, it needs to be signed and dated again as they cannot take initials and altered signatures. Ricky Meyers also states that they are missing the insurance information, so they will need a copy of the ID number, but starting in 2023, they will only be taking applications for uninsured patients.    Fax papers to [phone]      Please advise

## 2021-10-30 ENCOUNTER — Other Ambulatory Visit: Payer: Self-pay

## 2021-10-30 ENCOUNTER — Encounter: Payer: Self-pay | Admitting: *Deleted

## 2021-10-30 ENCOUNTER — Other Ambulatory Visit: Payer: Self-pay | Admitting: Neurology

## 2021-10-30 ENCOUNTER — Ambulatory Visit
Admission: RE | Admit: 2021-10-30 | Discharge: 2021-10-30 | Disposition: A | Discharge status: Home / Self Care | Payer: Medicare Other | Source: Ambulatory Visit | Attending: Neurology | Admitting: Neurology

## 2021-10-30 DIAGNOSIS — M5416 Radiculopathy, lumbar region: Secondary | ICD-10-CM

## 2021-10-30 DIAGNOSIS — I8290 Acute embolism and thrombosis of unspecified vein: Secondary | ICD-10-CM | POA: Diagnosis not present

## 2021-10-30 DIAGNOSIS — Z4589 Encounter for adjustment and management of other implanted devices: Secondary | ICD-10-CM | POA: Diagnosis not present

## 2021-10-30 HISTORY — PX: IR RADIOLOGIST EVAL & MGMT: IMG5224

## 2021-10-30 NOTE — Consult Note (Addendum)
Chief Complaint: Venous Thrombo-embolic Disease, upcoming surgery  Referring Physician(s): Jones,David E  Pulmonology: Dr. Melvyn Novas PCP: Dr. Sarajane Jews  History of Present Illness: Ricky Meyers is a 75 y.o. male presenting today [facility], kindly referred by Dr. Ronnald Ramp of [facility], for evaluation of peri-operative IVC filter.   Ricky Meyers joins [place] today with his family, Ricky Meyers, on the phone.  We confirmed his identity with 2 personal identifiers.    Ricky Meyers tells me that he had 1 episode of DVT and PE about 2 years ago, when he was hospitalized in August 2020.  He tells me he was walking to get the mail at his house, had some leg pain and "a bruise on his leg" and then became sweaty and SOB.  He was admitted with sub-massive PE and acute cor pulmonale diagnosis.    He says he was simply at his house doing the usual things when this occurred.  He was not being treated for cancer, he was not on a long trip, and he was not recently hospitalized.  He was treated years ago for prostate CA with surgery at [facility].   Diagnosis of idiopathic/unprovoked DVT was made.  Dr. Morrison Old notes reflect that he has not undergone any diagnostic testing for heritable thrombophilia. He has no COPD, and no current diagnosis of cancer.   HDL 08/23/21: 65 PSA 08/23/21: below normal  BMI: >30, obese  DVT study performed on admission 06/16/2019: No right DVT + left DVT of FV, posterior tibial veins Repeat study performed 04/13/2020: Recanalized left FV and PT vv  Currently he is taking xarelto, and he tells me that Dr. Morrison Old endorses an indefinite AC strategy given his history.     Past Medical History:  Diagnosis Date   Arthritis    neck and back    Chronic neck pain    Depression    ED (erectile dysfunction)    GERD (gastroesophageal reflux disease)    dysphagia   Gout    sees Dr. Leigh Aurora    Hyperlipidemia    Hypertension    OSA  (obstructive sleep apnea) 12/18/2017   Severe with AHI at 70.1/hr and is now on CPAP at 11cm H2o   Prostate cancer [facility])    history of   Pulmonary embolism ([place]) 06/2019   Sleep apnea    snoring/never checked    Past Surgical History:  Procedure Laterality Date   AMPUTATION Left 07/18/2018   Procedure: LEFT RING FINGER REVISION AMPUTATION;  Surgeon: Leanora Cover, MD;  Location: [facility];  Service: Orthopedics;  Laterality: Left;   CERVICAL FUSION  12/02/07   per Dr. Sherley Bounds   COLONOSCOPY  03/11/2017   per Dr. Ardis Hughs, clear, no repeats needed    injection lower back Right 09/17/2016   PROSTATECTOMY     ROTATOR CUFF REPAIR Right 11/2017   TONSILLECTOMY     VASECTOMY      Allergies: Patient has no known allergies.  Medications: Prior to Admission medications   Medication Sig Start Date End Date Taking? Authorizing Provider  allopurinol (ZYLOPRIM) 300 MG tablet Take 1 tablet (300 mg total) by mouth daily. 10/19/21   [name], MD  atorvastatin (LIPITOR) 40 MG tablet Take 1 tablet (40 mg total) by mouth daily. 10/19/21   [name], MD  cyclobenzaprine (FLEXERIL) 10 MG tablet TAKE 1 TABLET BY MOUTH 3 TIMES DAILY AS NEEDED FOR MUSCLE SPASMS 10/22/21   Alysia Penna  A, MD  diazepam (VALIUM) 5 MG tablet Take 1 tablet (5 mg total) by mouth every 12 (twelve) hours as needed for anxiety. 07/06/21   [name], MD  gabapentin (NEURONTIN) 100 MG capsule Take 1 capsule (100 mg total) by mouth 4 (four) times daily. 08/23/21   [name], MD  oxyCODONE-acetaminophen (PERCOCET) 10-325 MG tablet Take 1 tablet by mouth every 6 (six) hours as needed for pain. 10/22/21 11/21/21  [name], MD  PARoxetine (PAXIL) 20 MG tablet Take 1 tablet (20 mg total) by mouth every morning. 10/19/21   [name], MD  psyllium (METAMUCIL) 58.6 % packet Take 1 packet by mouth daily as needed (for constipation).     [provider]  rivaroxaban (XARELTO) 20 MG TABS tablet Take 1 tablet  (20 mg total) by mouth daily with supper. 10/08/21   [name], MD  telmisartan-hydrochlorothiazide (MICARDIS HCT) 80-25 MG tablet TAKE 1 TABLET BY MOUTH EVERY DAY 07/02/21   Tanda Rockers, MD  XARELTO 20 MG TABS tablet TAKE 1 TABLET BY MOUTH EVERY DAY WITH SUPPER 07/02/21   [name], MD     Family History  Problem Relation Age of Onset   Cancer Other        breast, porstate   Hypertension Other    Stroke Other    Breast cancer Mother    Diverticulitis Mother    Heart disease Father    Prostate cancer Father    Stroke Father    Dementia Sister    Colon cancer Neg Hx     Social History   Socioeconomic History   Marital status: Divorced    Spouse name: Not on file   Number of children: Not on file   Years of education: Not on file   Highest education level: Not on file  Occupational History   Occupation: retired  Tobacco Use   Smoking status: Never   Smokeless tobacco: Never  Vaping Use   Vaping Use: Never used  Substance and Sexual Activity   Alcohol use: Yes    Alcohol/week: 0.0 standard drinks    Comment: occ   Drug use: No   Sexual activity: Not on file  Other Topics Concern   Not on file  Social History Narrative   Not on file   Social Determinants of Health   Financial Resource Strain: Low Risk    Difficulty of Paying Living Expenses: Not hard at all  Food Insecurity: No Food Insecurity   Worried About Charity fundraiser in the Last Year: Never true   [place] in the Last Year: Never true  Transportation Needs: No Transportation Needs   Lack of Transportation (Medical): No   Lack of Transportation (Non-Medical): No  Physical Activity: Insufficiently Active   Days of Exercise per Week: 4 days   Minutes of Exercise per Session: 20 min  Stress: No Stress Concern Present   Feeling of Stress : Not at all  Social Connections: Moderately Integrated   Frequency of Communication with Friends and Family: More than three times a week    Frequency of Social Gatherings with Friends and Family: Once a week   Attends Religious Services: More than 4 times per year   Active Member of [employer] or Organizations: Yes   Attends Archivist Meetings: 1 to 4 times per year   Marital Status: Divorced       Review of Systems  Review of Systems: A  12 point ROS discussed and pertinent positives are indicated in the HPI above.  All other systems are negative.  Physical Exam No direct physical exam was performed (except for noted visual exam findings with Video Visits).   Vital Signs: There were no vitals taken for this visit.  Imaging: No results found.  Labs:  CBC: Recent Labs    12/04/20 1423 08/23/21 0940  WBC 5.9 8.3  HGB 13.3 13.3  HCT 38.8* 39.1  PLT 151.0 178.0    COAGS: No results for input(s): INR, APTT in the last 8760 hours.  BMP: Recent Labs    08/23/21 0940  NA 138  K 3.8  CL 100  CO2 29  GLUCOSE 94  BUN 20  CALCIUM 9.6  CREATININE 1.25    LIVER FUNCTION TESTS: Recent Labs    08/23/21 0940  BILITOT 1.1  AST 17  ALT 15  ALKPHOS 58  PROT 6.7  ALBUMIN 4.2    TUMOR MARKERS: No results for input(s): AFPTM, CEA, CA199, CHROMGRNA in the last 8760 hours.  Assessment and Plan:  Ricky Meyers is a 75 year old male with history of unprovoked DVT/PE August of 2020, considered to be high risk for recurrence, and with a current strategy for indefinite anti-coagulation.  He has upcoming lumbar spine surgery tentatively 11/21/2021.   Ricky Meyers will need to be removed from his DOAC for surgery, and a peri-operative IVC filter is reasonable given his risk for recurrent VTE.   I had a lengthy discussion today with Ricky Meyers and his family regarding the natural history, pathology/pathophysiology, and concepts for treating VTE, including the concept of IVC filters.  Risks and benefits regarding retrievable IVC filter were discussed with the patient including, but not limited to bleeding, infection,  venous injury, contrast induced renal failure, filter fracture or migration, need for additional surgery/procedure, need for retrieval, strut penetration with damage or irritation to adjacent structures and ~4% risk of ilio-caval thrombosis, cardiopulmonary collapse, death.  All of the patient's questions were answered, patient is agreeable to proceed.   Plan: - Plan for retrievable IVC filter before the surgical date of 11/21/2021.  Best with Dr. Earleen Newport, but Ricky Meyers understands if it needs to be with another VIR partner because of scheduling.  - No need to hold his current [facility] for the placement    Thank you for this interesting consult.  I greatly enjoyed meeting Ricky Meyers and look forward to participating in their care.  A copy of this report was sent to the requesting provider on this date.  Electronically Signed: Corrie Mckusick 10/30/2021, 9:58 AM   I spent a total of  40 Minutes   in remote  clinical consultation, greater than 50% of which was counseling/coordinating care for peri-operative IVC filter placement.    Visit type: Audio only (telephone). Audio (no video) only due to patient's lack of internet/smartphone capability. Alternative for in-person consultation at [facility], [address] [place], [place], [place]. This visit type was conducted due to national recommendations for restrictions regarding the COVID-19 Pandemic (e.g. social distancing).  This format is felt to be most appropriate for this patient at this time.  All issues noted in this document were discussed and addressed.

## 2021-10-31 ENCOUNTER — Telehealth: Payer: Self-pay

## 2021-10-31 ENCOUNTER — Telehealth (INDEPENDENT_AMBULATORY_CARE_PROVIDER_SITE_OTHER): Payer: Medicare Other | Admitting: Family Medicine

## 2021-10-31 ENCOUNTER — Encounter: Payer: Self-pay | Admitting: Family Medicine

## 2021-10-31 VITALS — Temp 96.7°F | Wt 235.0 lb

## 2021-10-31 DIAGNOSIS — U071 COVID-19: Secondary | ICD-10-CM

## 2021-10-31 DIAGNOSIS — I340 Nonrheumatic mitral (valve) insufficiency: Secondary | ICD-10-CM

## 2021-10-31 MED ORDER — MOLNUPIRAVIR EUA 200MG CAPSULE: 4.0000 | ORAL_CAPSULE | Freq: Two times a day (BID) | ORAL | 0 refills | Status: DC

## 2021-10-31 NOTE — Telephone Encounter (Addendum)
Patient Name: SLAYTON LUBITZ  DOB: [birth year] MRN: [id number]   Primary [place], MD  Chart reviewed as part of pre-operative protocol coverage. Because of [place]'s past medical history and time since last visit, he/she will require a follow-up visit in order to better assess preoperative cardiovascular risk.  Pre-op covering staff: - Please schedule appointment and call patient to inform them. - Please contact requesting surgeon's office via preferred method (i.e, phone, fax) to inform them of need for appointment prior to surgery.  If applicable, this message will also be routed to pharmacy pool and/or primary cardiologist for input on holding anticoagulant/antiplatelet agent as requested below so that this information is available at time of patient's appointment.   Additionally, Xarelto is prescribed for DVT and clearance will need to be given by PCP or specialist who is managing patient's chronic DVT.  Emmaline Life, NP-C    10/31/2021, 8:56 AM [place] [address] Office [phone] Fax [phone]

## 2021-10-31 NOTE — Telephone Encounter (Signed)
Surgeons office is aware of pt's appt date and instructions to hold Xarelto.   Please go over instructions with pt at the time of his appt.

## 2021-10-31 NOTE — Telephone Encounter (Incomplete Revision)
Patient Name: Ricky Meyers  DOB: 01/20/1946 MRN: [id number]   Primary [place], MD  Chart reviewed as part of pre-operative protocol coverage. Because of [place]'s past medical history and time since last visit, he/she will require a follow-up visit in order to better assess preoperative cardiovascular risk.  Pre-op covering staff: - Please schedule appointment and call patient to inform them. - Please contact requesting surgeon's office via preferred method (i.e, phone, fax) to inform them of need for appointment prior to surgery.  If applicable, this message will also be routed to pharmacy pool and/or primary cardiologist for input on holding anticoagulant/antiplatelet agent as requested below so that this information is available at time of patient's appointment.   Additionally, Xarelto is prescribed for DVT and clearance will need to be given by PCP or specialist who is managing patient's chronic DVT.  Emmaline Life, NP-C    10/31/2021, 8:56 AM [place] [address] Office [phone] Fax [phone]

## 2021-10-31 NOTE — Progress Notes (Signed)
° °  Subjective:    Patient ID: Ricky Meyers, male    DOB: 1946-06-28, 75 y.o.   MRN: [id number]  HPI Virtual Visit via Telephone Note  I connected with the patient on 10/31/21 at  3:45 PM EST by telephone and verified that I am speaking with the correct person using two identifiers.   I discussed the limitations, risks, security and privacy concerns of performing an evaluation and management service by telephone and the availability of in person appointments. I also discussed with the patient that there may be a patient responsible charge related to this service. The patient expressed understanding and agreed to proceed.  Location patient: home Location provider: work or home office Participants present for the call: patient, provider Patient did not have a visit in the prior 7 days to address this/these issue(s).   History of Present Illness: Here for 2 days of ST, a dry cough, and body aches. No fever or SOB. He and his wife tested positive for the Covid-19 virus this morning.    Observations/Objective: Patient sounds cheerful and well on the phone. I do not appreciate any SOB. Speech and thought processing are grossly intact. Patient reported vitals:  Assessment and Plan: Treat with 5 days of Molnupiravir. Recheck as needed.  Alysia Penna, MD   Follow Up Instructions:     [phone] 5-10 [id number] 11-20 9443 21-30 I did not refer this patient for an OV in the next 24 hours for this/these issue(s).  I discussed the assessment and treatment plan with the patient. The patient was provided an opportunity to ask questions and all were answered. The patient agreed with the plan and demonstrated an understanding of the instructions.   The patient was advised to call back or seek an in-person evaluation if the symptoms worsen or if the condition fails to improve as anticipated.  I provided 18 minutes of non-face-to-face time during this encounter.   Alysia Penna, MD     Review of  Systems     Objective:   Physical Exam        Assessment & Plan:

## 2021-10-31 NOTE — Telephone Encounter (Signed)
Pt is scheduled to see JV on 1/5 for surgical clearance.

## 2021-10-31 NOTE — Telephone Encounter (Signed)
Patient with diagnosis of PE on Xarelto for anticoagulation.    Procedure: L3-4 Lumbar Laminectomy Date of procedure: 11/21/21   CrCl 76 mL/min Platelet count 178K  Per office protocol, patient can hold Xarelto  for 3 days prior to procedure.

## 2021-10-31 NOTE — Telephone Encounter (Signed)
° °  Pre-operative Risk Assessment    Patient Name: Ricky Meyers  DOB: 1946/05/17 MRN: [id number]      Request for Surgical Clearance    Procedure:   L3-4 Lumbar Laminectomy  Date of Surgery:  Clearance 11/21/21                                 Surgeon:  [name] Surgeon's Group or Practice Name:  [facility] Phone number:  [phone] ATTN: Lorriane Shire x-[phone] Fax number:  [phone]    Type of Clearance Requested:   - Medical  -Pharmacy - Xarelto   Type of Anesthesia:  General    Additional requests/questions:   How many days patient can hold Xarelto.   Signed, Michaelyn Wall   10/31/2021, 8:35 AM

## 2021-10-31 NOTE — Telephone Encounter (Signed)
Please advise 

## 2021-10-31 NOTE — Telephone Encounter (Signed)
I cannot tell if they want me to change the original form or not. Please ask Maddy to find out what they want exactly and I will sign the form. Thanks

## 2021-11-01 ENCOUNTER — Telehealth: Payer: Self-pay | Admitting: Family Medicine

## 2021-11-01 MED ORDER — APIXABAN 5 MG PO TABS: 5.0000 mg | ORAL_TABLET | Freq: Two times a day (BID) | ORAL | 3 refills | Status: DC

## 2021-11-01 NOTE — Telephone Encounter (Signed)
Renee with [employer] is requesting a phone call back from either Maddie or Izora Gala regarding this patient application.  The number you could reach someone at [employer] is [phone].  Please advise.

## 2021-11-01 NOTE — Telephone Encounter (Signed)
Yes that's a great idea. I have DC'd the Xarelto and I sent in a new RX to [facility] for Eliquis 5 mg BID

## 2021-11-01 NOTE — Addendum Note (Signed)
Addended by: Alysia Penna A on: 11/01/2021 12:41 PM   Modules accepted: Orders

## 2021-11-01 NOTE — Telephone Encounter (Signed)
Called [employer] back and they informed me that the patient was not elligible for patient assistance because he has Medicare Part A&B. Their policy has changed since last year and they will not accept applications for anyone with insurance, regardless of drug coverage or not.  Routing to PCP to make him aware to see if he wants to try Eliquis PAP instead.

## 2021-11-02 ENCOUNTER — Telehealth: Payer: Self-pay | Admitting: Pharmacist

## 2021-11-02 NOTE — Telephone Encounter (Signed)
Called pharmacy to provide 1 time use 30 day free trial offer for Eliquis.  Called patient and made him aware of the change to Eliquis as he was rejected by patient assistance for Xarelto. Patient has 1 week left of the medication on hand. He is aware to start taking the Eliquis when he runs out of the Xarelto and is due for his next dose. He is also aware to take Eliquis doses close to 12 hours apart. Reminded patient that if he is running out of Eliquis to call the office for samples.  Will mail PAP for Eliquis.

## 2021-11-02 NOTE — Chronic Care Management (AMB) (Signed)
° ° °  Chronic Care Management Pharmacy Assistant   Name: CECILE GUEVARA  MRN: [id number] DOB: Jul 25, 1946  Reason for Encounter: Complete renewal application for Eliquis and mailed to patient. Patient notified.  Care Gaps: AWV - scheduled for 11/14/21 Last BP - 92/74 on 08/23/2021 Covid-19 vaccine - overdue  Zoster vaccines - overdue  Star [facility] Drugs: Atorvastatin 40mg  - last filled on 07/23/21 90DS at [facility] Telmisartan - hydrochlorothiazide 80-25mg  - last filled on 07/23/21 90DS at [facility] [phone]

## 2021-11-06 ENCOUNTER — Encounter: Payer: Self-pay | Admitting: Family Medicine

## 2021-11-06 ENCOUNTER — Ambulatory Visit (INDEPENDENT_AMBULATORY_CARE_PROVIDER_SITE_OTHER): Payer: Medicare Other | Admitting: Family Medicine

## 2021-11-06 VITALS — BP 102/74 | HR 90 | Temp 97.9°F | Wt 238.1 lb

## 2021-11-06 DIAGNOSIS — F1190 Opioid use, unspecified, uncomplicated: Secondary | ICD-10-CM | POA: Diagnosis not present

## 2021-11-06 DIAGNOSIS — M159 Polyosteoarthritis, unspecified: Secondary | ICD-10-CM

## 2021-11-06 MED ORDER — OXYCODONE-ACETAMINOPHEN 10-325 MG PO TABS: 1.0000 | ORAL_TABLET | Freq: Four times a day (QID) | ORAL | 0 refills | Status: DC | PRN

## 2021-11-06 MED ORDER — OXYCODONE-ACETAMINOPHEN 10-325 MG PO TABS: 1.0000 | ORAL_TABLET | Freq: Four times a day (QID) | ORAL | 0 refills | Status: AC | PRN

## 2021-11-06 NOTE — Addendum Note (Signed)
Addended by: Rosalyn Gess D on: 11/06/2021 11:15 AM   Modules accepted: Orders

## 2021-11-06 NOTE — Addendum Note (Signed)
Addended by: Rosalyn Gess D on: 11/06/2021 11:14 AM   Modules accepted: Orders

## 2021-11-06 NOTE — Progress Notes (Signed)
° °  Subjective:    Patient ID: Ricky Meyers, male    DOB: 06-11-1946, 76 y.o.   MRN: [id number]  HPI Here for pain management. He is about the same as far as low back pain goes. He is scheduled for a lumbar microdiscectomy per Dr. Sherley Bounds on 11-21-21.    Review of Systems     Objective:   Physical Exam        Assessment & Plan:  Pain management. Indication for chronic opioid: OA  Medication and dose: Percocet 10-325 # pills per month: 120 Last UDS date: 11-06-21 Opioid Treatment Agreement signed (Y/N): 01-15-19 Opioid Treatment Agreement last reviewed with patient:  11-06-21 [facility] reviewed this encounter (include red flags): Yes Meds were refilled.  Alysia Penna, MD

## 2021-11-07 NOTE — Progress Notes (Signed)
Cardiology Office Note   Date:  11/08/2021   ID:  Ricky Meyers 03/14/1946, MRN [id number]  PCP:  [name], MD    Chief Complaint  Patient presents with   Follow-up   Mitral regurgitation  Wt Readings from Last 3 Encounters:  11/08/21 237 lb (107.5 kg)  11/06/21 238 lb 1 oz (108 kg)  10/31/21 235 lb (106.6 kg)       History of Present Illness: Ricky Meyers is a 76 y.o. male    Who has had PVCs in the past.   He had some DOE  And was referred for nuclear stress test and echocardiogram.  No ischemia noted but he did have slightly decreased EF.     2018 echo showed: Technically difficult study. Definity contrast given. LVEF 45-50%   with anterior and anteroseptal hypokinesis to dyskinesis, mild   LVH, grade 1 DD, normal LV filling pressure, trivial MR, normal   LA size, normal IVC.   He has chronic back issues which haave limited his activity.  He has had issues with lack of exercise in the past.    He was seen by Dr. Radford Pax for OSA. Had DOE in the past thought to be due to deconditioning.   He was diagnosed with DVT/PE in 06/2019.  He has been on XArelto.  Breathing has been better.  MVP: eccentric MR noted by echo in 06/2019.     06/2019 echo: "The left ventricle has normal systolic function, with an ejection fraction of 55-60%. The cavity size was normal. There is mildly increased left ventricular wall thickness. Left ventricular diastolic Doppler parameters are consistent with impaired  relaxation.  2. The right ventricle has moderately reduced systolic function. The cavity was mildly enlarged.  3. The aortic valve has an indeterminate number of cusps. No stenosis of the aortic valve.  4. The aorta is normal in size and structure.  5. The inferior vena cava was dilated in size with >50% respiratory variability.  6. There is left bowing of the interatrial septum, suggestive of elevated right atrial pressure.  7. Technically difficult; normal LV systolic  function; mild diastolic dysfunction; mild LVH; mitral valve not well visualize but there is eccentric MR directed anteriorly suggesting prolapse of posterior MV leaflet; mild RVE; moderate RV dysfunction."  2021 echo: "Left ventricular ejection fraction, by estimation, is 55 to 60%. The  left ventricle has normal function. The left ventricle has no regional  wall motion abnormalities. Left ventricular diastolic parameters are  consistent with Grade I diastolic  dysfunction (impaired relaxation).   2. Right ventricular systolic function is normal. The right ventricular  size is normal.   3. There is severe prolapse of the P2 segment of the PMVL. There is at  least moderate MR, that is eccentric and anteriorly directed. The LA is  normal size, the LV is normal size, and the MV inflow is A dominant.  Suspect this is moderate MR. If there are   clinical concerns for more severe regurgitation, would recommend TEE. The  MR does appear similar to 2018. The mitral valve is myxomatous. Moderate  mitral valve regurgitation. No evidence of mitral stenosis.   4. The aortic valve is tricuspid. Aortic valve regurgitation is not  visualized. No aortic stenosis is present.   5. The inferior vena cava is normal in size with greater than 50%  respiratory variability, suggesting right atrial pressure of 3 mmHg. "   Plan for back surgery on Jan  18th. This has limited walking.  He has received some steroid shots.  He walks up stairs at home.  If he takes oxycodone, he can walk more.  He thinks in the past 3 months, he has done the stairs 1 time.  No ischemia by stress test in 2018.  Denies : Chest pain. Dizziness. Leg edema. Nitroglycerin use. Orthopnea. Palpitations. Paroxysmal nocturnal dyspnea.  Syncope.    Dyspnea on exertion worse since he has been exercising less then has gained some weight.   Past Medical History:  Diagnosis Date   Arthritis    neck and back    Chronic neck pain     Depression    ED (erectile dysfunction)    GERD (gastroesophageal reflux disease)    dysphagia   Gout    sees Dr. Leigh Aurora    Hyperlipidemia    Hypertension    OSA (obstructive sleep apnea) 12/18/2017   Severe with AHI at 70.1/hr and is now on CPAP at 11cm H2o   Prostate cancer [facility])    history of   Pulmonary embolism ([place]) 06/2019   Sleep apnea    snoring/never checked    Past Surgical History:  Procedure Laterality Date   AMPUTATION Left 07/18/2018   Procedure: LEFT RING FINGER REVISION AMPUTATION;  Surgeon: Leanora Cover, MD;  Location: [place];  Service: Orthopedics;  Laterality: Left;   CERVICAL FUSION  12/02/07   per Dr. Sherley Bounds   COLONOSCOPY  03/11/2017   per Dr. Ardis Hughs, clear, no repeats needed    injection lower back Right 09/17/2016   IR RADIOLOGIST EVAL & MGMT  10/30/2021   PROSTATECTOMY     ROTATOR CUFF REPAIR Right 11/2017   TONSILLECTOMY     VASECTOMY       Current Outpatient Medications  Medication Sig Dispense Refill   allopurinol (ZYLOPRIM) 300 MG tablet Take 1 tablet (300 mg total) by mouth daily. 90 tablet 0   apixaban (ELIQUIS) 5 MG TABS tablet Take 1 tablet (5 mg total) by mouth 2 (two) times daily. 180 tablet 3   atorvastatin (LIPITOR) 40 MG tablet Take 1 tablet (40 mg total) by mouth daily. 90 tablet 0   cyclobenzaprine (FLEXERIL) 10 MG tablet TAKE 1 TABLET BY MOUTH 3 TIMES DAILY AS NEEDED FOR MUSCLE SPASMS 90 tablet 5   diazepam (VALIUM) 5 MG tablet Take 1 tablet (5 mg total) by mouth every 12 (twelve) hours as needed for anxiety. 30 tablet 0   gabapentin (NEURONTIN) 100 MG capsule Take 1 capsule (100 mg total) by mouth 4 (four) times daily. 120 capsule 5   [START ON 01/04/2022] oxyCODONE-acetaminophen (PERCOCET) 10-325 MG tablet Take 1 tablet by mouth every 6 (six) hours as needed for pain. 120 tablet 0   PARoxetine (PAXIL) 20 MG tablet Take 1 tablet (20 mg total) by mouth every morning. 90 tablet 0   psyllium (METAMUCIL) 58.6 % packet Take 1  packet by mouth daily as needed (for constipation).      rivaroxaban (XARELTO) 20 MG TABS tablet Take 1 tablet (20 mg total) by mouth daily with supper. 28 tablet 0   telmisartan-hydrochlorothiazide (MICARDIS HCT) 80-25 MG tablet TAKE 1 TABLET BY MOUTH EVERY DAY 30 tablet 11   No current facility-administered medications for this visit.    Allergies:   Patient has no known allergies.    Social History:  The patient  reports that he has never smoked. He has never used smokeless tobacco. He reports current alcohol use. He reports that  he does not use drugs.   Family History:  [name] family history includes Breast cancer in his mother; Cancer in an other family member; Dementia in his sister; Diverticulitis in his mother; Heart disease in his father; Hypertension in an other family member; Prostate cancer in his father; Stroke in his father and another family member.    ROS:  Please see the history of present illness.   Otherwise, review of systems are positive for DOE.   All other systems are reviewed and negative.    PHYSICAL EXAM: VS:  BP 114/76    Pulse 93    Ht 6' (1.829 m)    Wt 237 lb (107.5 kg)    SpO2 98%    BMI 32.14 kg/m  , BMI Body mass index is 32.14 kg/m. GEN: Well nourished, well developed, in no acute distress HEENT: normal Neck: no JVD, carotid bruits, or masses Cardiac: RRR; no murmurs, rubs, or gallops,no edema  Respiratory:  clear to auscultation bilaterally, normal work of breathing GI: soft, nontender, nondistended, + BS, obese MS: no deformity or atrophy Skin: warm and dry, no rash; prominent veins on both lower extremities Neuro:  Strength and sensation are intact Psych: euthymic mood, full affect   EKG:   The ekg ordered today demonstrates NSR, LBBB   Recent Labs: 08/23/2021: ALT 15; BUN 20; Creatinine, Ser 1.25; Hemoglobin 13.3; Platelets 178.0; Potassium 3.8; Sodium 138; TSH 1.32   Lipid Panel    Component Value Date/Time   CHOL 153  08/23/2021 0940   TRIG 84.0 08/23/2021 0940   HDL 65.20 08/23/2021 0940   CHOLHDL 2 08/23/2021 0940   VLDL 16.8 08/23/2021 0940   LDLCALC 71 08/23/2021 0940   LDLCALC 88 08/22/2020 1208   LDLDIRECT 161.4 09/29/2012 0811     Other studies Reviewed: Additional studies/ records that were reviewed today with results demonstrating: labs reviewed.   ASSESSMENT AND PLAN:  MVP: eccentric MR. We will plan to repeat echocardiogram to look for worsening mitral regurgitation.  If there is any drop in ejection fraction, would need to pursue transesophageal echo to better assess the severity of mitral regurgitation. Preoperative cardiovascular exam: Exercise capacity is decreased due to his back pain.  Last ischemic evaluation was in 2018.  Plan for Lexiscan Myoview to evaluate for any high risk ischemia.  LBBB will require pharmacologic stress.  PVCs: No sx. DOE: RV function improved in 2021.  Xarelto dose was decreased by pulmonary to 10 mg.  This is being changed to Eliquis for cost reasons.  He has been unable to exercise regularly due to his back problems.  Hopefully once his surgery is finished, he will be able to get back to regular exercise and improve his stamina. Hyperlipidemia: LDL 71.  Continue atorvastatin.  Obesity: Healthy diet and regular exercise will be beneficial for weight loss. OSA: Not using CPAP as he does not tolerate.  His wife says his snoring has decreasing.  Follows with Dr. Melvyn Novas.    Current medicines are reviewed at length with the patient today.  The patient concerns regarding his medicines were addressed.  The following changes have been made:  No change  Labs/ tests ordered today include:  No orders of the defined types were placed in this encounter.   Recommend 150 minutes/week of aerobic exercise Low fat, low carb, high fiber diet recommended  Disposition:   FU in 1 year   Signed, Larae Grooms, MD  11/08/2021 8:58 AM    [facility]  [place], [place], [place]  [address] Phone: [phone]; Fax: [phone]

## 2021-11-08 ENCOUNTER — Telehealth (HOSPITAL_COMMUNITY): Payer: Self-pay | Admitting: *Deleted

## 2021-11-08 ENCOUNTER — Encounter: Payer: Self-pay | Admitting: Interventional Cardiology

## 2021-11-08 ENCOUNTER — Encounter (HOSPITAL_COMMUNITY): Payer: Self-pay | Admitting: *Deleted

## 2021-11-08 ENCOUNTER — Other Ambulatory Visit: Payer: Self-pay

## 2021-11-08 ENCOUNTER — Ambulatory Visit (INDEPENDENT_AMBULATORY_CARE_PROVIDER_SITE_OTHER): Payer: Medicare Other | Admitting: Interventional Cardiology

## 2021-11-08 ENCOUNTER — Encounter: Payer: Self-pay | Admitting: *Deleted

## 2021-11-08 VITALS — BP 114/76 | HR 93 | Ht 72.0 in | Wt 237.0 lb

## 2021-11-08 DIAGNOSIS — I340 Nonrheumatic mitral (valve) insufficiency: Secondary | ICD-10-CM

## 2021-11-08 DIAGNOSIS — I493 Ventricular premature depolarization: Secondary | ICD-10-CM

## 2021-11-08 DIAGNOSIS — Z01810 Encounter for preprocedural cardiovascular examination: Secondary | ICD-10-CM | POA: Diagnosis not present

## 2021-11-08 DIAGNOSIS — E782 Mixed hyperlipidemia: Secondary | ICD-10-CM | POA: Diagnosis not present

## 2021-11-08 DIAGNOSIS — I447 Left bundle-branch block, unspecified: Secondary | ICD-10-CM

## 2021-11-08 DIAGNOSIS — G4733 Obstructive sleep apnea (adult) (pediatric): Secondary | ICD-10-CM | POA: Diagnosis not present

## 2021-11-08 DIAGNOSIS — R0609 Other forms of dyspnea: Secondary | ICD-10-CM | POA: Diagnosis not present

## 2021-11-08 DIAGNOSIS — Z01818 Encounter for other preprocedural examination: Secondary | ICD-10-CM

## 2021-11-08 DIAGNOSIS — I341 Nonrheumatic mitral (valve) prolapse: Secondary | ICD-10-CM

## 2021-11-08 NOTE — Patient Instructions (Signed)
Medication Instructions:  Your physician recommends that you continue on your current medications as directed. Please refer to the Current Medication list given to you today.  *If you need a refill on your cardiac medications before your next appointment, please call your pharmacy*   Lab Work: none If you have labs (blood work) drawn today and your tests are completely normal, you will receive your results only by: [place] (if you have MyChart) OR A paper copy in the mail If you have any lab test that is abnormal or we need to change your treatment, we will call you to review the results.   Testing/Procedures: Your physician has requested that you have an echocardiogram. Echocardiography is a painless test that uses sound waves to create images of your heart. It provides your doctor with information about the size and shape of your heart and how well your hearts chambers and valves are working. This procedure takes approximately one hour. There are no restrictions for this procedure.  Your physician has requested that you have a lexiscan myoview. For further information please visit [email or web address]. Please follow instruction sheet, as given.    Follow-Up: At [facility], you and your health needs are our priority.  As part of our continuing mission to provide you with exceptional heart care, we have created designated Provider Care Teams.  These Care Teams include your primary Cardiologist (physician) and Advanced Practice Providers (APPs -  Physician Assistants and Nurse Practitioners) who all work together to provide you with the care you need, when you need it.  We recommend signing up for the patient portal called "MyChart".  Sign up information is provided on this After Visit Summary.  MyChart is used to connect with patients for Virtual Visits (Telemedicine).  Patients are able to view lab/test results, encounter notes, upcoming appointments, etc.  Non-urgent messages can  be sent to your provider as well.   To learn more about what you can do with MyChart, go to [email or web address].    Your next appointment:   12 month(s)  The format for your next appointment:   In Person  Provider:   Larae Grooms, MD     Other Instructions

## 2021-11-08 NOTE — Telephone Encounter (Signed)
Left message on voicemail in reference to upcoming appointment scheduled for 11/13/09 Phone number given for a call back so details instructions can be given. Letter sent via my chart with instructions. Kirstie Peri

## 2021-11-08 NOTE — Telephone Encounter (Signed)
Pt had an office visit with Dr Sarajane Jews this week, message resolved

## 2021-11-08 NOTE — Telephone Encounter (Signed)
OV notes and clearance form have been faxed back to [facility]. Nothing further needed at this time.  °

## 2021-11-10 LAB — DRUG MONITOR, PANEL 1, W/CONF, URINE
Amphetamines: NEGATIVE ng/mL (ref <500)
Barbiturates: NEGATIVE ng/mL (ref <300)
Benzodiazepines: NEGATIVE ng/mL (ref <100)
Cocaine Metabolite: NEGATIVE ng/mL (ref <150)
Codeine: NEGATIVE ng/mL (ref <50)
Creatinine: 207.5 mg/dL (ref >=20.0)
Hydrocodone: 141 ng/mL — ABNORMAL HIGH (ref <50)
Hydromorphone: NEGATIVE ng/mL (ref <50)
Marijuana Metabolite: 15 ng/mL — ABNORMAL HIGH (ref <5)
Marijuana Metabolite: POSITIVE ng/mL — AB (ref <20)
Methadone Metabolite: NEGATIVE ng/mL (ref <100)
Morphine: NEGATIVE ng/mL (ref <50)
Norhydrocodone: 462 ng/mL — ABNORMAL HIGH (ref <50)
Noroxycodone: >10000 ng/mL — ABNORMAL HIGH (ref <50)
Opiates: POSITIVE ng/mL — AB (ref <100)
Oxidant: NEGATIVE ug/mL (ref <200)
Oxycodone: 3877 ng/mL — ABNORMAL HIGH (ref <50)
Oxycodone: POSITIVE ng/mL — AB (ref <100)
Oxymorphone: 854 ng/mL — ABNORMAL HIGH (ref <50)
Phencyclidine: NEGATIVE ng/mL (ref <25)
pH: 5.9 (ref 4.5–9.0)

## 2021-11-10 LAB — DM TEMPLATE

## 2021-11-12 ENCOUNTER — Other Ambulatory Visit (HOSPITAL_COMMUNITY): Payer: Self-pay | Admitting: Interventional Radiology

## 2021-11-12 ENCOUNTER — Ambulatory Visit (INDEPENDENT_AMBULATORY_CARE_PROVIDER_SITE_OTHER): Payer: Medicare Other

## 2021-11-12 VITALS — BP 144/79 | HR 76 | Ht 72.0 in | Wt 237.0 lb

## 2021-11-12 DIAGNOSIS — Z86718 Personal history of other venous thrombosis and embolism: Secondary | ICD-10-CM

## 2021-11-12 DIAGNOSIS — Z0000 Encounter for general adult medical examination without abnormal findings: Secondary | ICD-10-CM

## 2021-11-12 NOTE — Progress Notes (Signed)
I connected with Marvel Plan today by telephone and verified that I am speaking with the correct person using two identifiers. Location patient: home Location provider: work Persons participating in the virtual visit: patient, provider.   I discussed the limitations, risks, security and privacy concerns of performing an evaluation and management service by telephone and the availability of in person appointments. I also discussed with the patient that there may be a patient responsible charge related to this service. The patient expressed understanding and verbally consented to this telephonic visit.    Interactive audio and video telecommunications were attempted between this provider and patient, however failed, due to patient having technical difficulties OR patient did not have access to video capability.  We continued and completed visit with audio only.     Vital signs may be patient reported or missing.  Subjective:   ADONI GREENOUGH is a 76 y.o. male who presents for Medicare Annual/Subsequent preventive examination.  Review of Systems     Cardiac Risk Factors include: advanced age (>1men, >32 women);dyslipidemia;hypertension;male gender     Objective:    Today's Vitals   11/12/21 1027 11/12/21 1032  BP: (!) 144/79   Pulse: 76   Weight: 237 lb (107.5 kg)   Height: 6' (1.829 m)   PainSc:  4    Body mass index is 32.14 kg/m.  Advanced Directives 11/12/2021 11/08/2020 06/15/2019 08/24/2017 06/10/2017 03/11/2017 02/25/2017  Does Patient Have a Medical Advance Directive? Yes Yes No - No Yes Yes  Type of Paramedic of [place];Living will Living will - [place];Living will [place];Living will  Does patient want to make changes to medical advance directive? - - - No - Patient declined - - -  Copy of [place] in Chart? No - copy requested - - No - copy requested - No -  copy requested No - copy requested  Would patient like information on creating a medical advance directive? - - No - Patient declined - - - -    Current Medications (verified) Outpatient Encounter Medications as of 11/12/2021  Medication Sig   allopurinol (ZYLOPRIM) 300 MG tablet Take 1 tablet (300 mg total) by mouth daily.   apixaban (ELIQUIS) 5 MG TABS tablet Take 1 tablet (5 mg total) by mouth 2 (two) times daily.   atorvastatin (LIPITOR) 40 MG tablet Take 1 tablet (40 mg total) by mouth daily.   cyclobenzaprine (FLEXERIL) 10 MG tablet TAKE 1 TABLET BY MOUTH 3 TIMES DAILY AS NEEDED FOR MUSCLE SPASMS   famotidine (PEPCID) 20 MG tablet Take 20 mg by mouth daily. After supper   gabapentin (NEURONTIN) 100 MG capsule Take 1 capsule (100 mg total) by mouth 4 (four) times daily.   [START ON 01/04/2022] oxyCODONE-acetaminophen (PERCOCET) 10-325 MG tablet Take 1 tablet by mouth every 6 (six) hours as needed for pain.   PARoxetine (PAXIL) 20 MG tablet Take 1 tablet (20 mg total) by mouth every morning.   psyllium (METAMUCIL) 58.6 % packet Take 1 packet by mouth daily as needed (for constipation).    telmisartan-hydrochlorothiazide (MICARDIS HCT) 80-25 MG tablet TAKE 1 TABLET BY MOUTH EVERY DAY   diazepam (VALIUM) 5 MG tablet Take 1 tablet (5 mg total) by mouth every 12 (twelve) hours as needed for anxiety. (Patient not taking: Reported on 11/12/2021)   rivaroxaban (XARELTO) 20 MG TABS tablet Take 1 tablet (20 mg total) by mouth daily with supper. (Patient not  taking: Reported on 11/12/2021)   No facility-administered encounter medications on file as of 11/12/2021.    Allergies (verified) Patient has no known allergies.   History: Past Medical History:  Diagnosis Date   Arthritis    neck and back    Chronic neck pain    Depression    ED (erectile dysfunction)    GERD (gastroesophageal reflux disease)    dysphagia   Gout    sees Dr. Leigh Aurora    Hyperlipidemia    Hypertension    OSA  (obstructive sleep apnea) 12/18/2017   Severe with AHI at 70.1/hr and is now on CPAP at 11cm H2o   Prostate cancer [facility])    history of   Pulmonary embolism ([place]) 06/2019   Sleep apnea    snoring/never checked   Past Surgical History:  Procedure Laterality Date   AMPUTATION Left 07/18/2018   Procedure: LEFT RING FINGER REVISION AMPUTATION;  Surgeon: Leanora Cover, MD;  Location: [employer];  Service: Orthopedics;  Laterality: Left;   CERVICAL FUSION  12/02/07   per Dr. Sherley Bounds   COLONOSCOPY  03/11/2017   per Dr. Ardis Hughs, clear, no repeats needed    injection lower back Right 09/17/2016   IR RADIOLOGIST EVAL & MGMT  10/30/2021   PROSTATECTOMY     ROTATOR CUFF REPAIR Right 11/2017   TONSILLECTOMY     VASECTOMY     Family History  Problem Relation Age of Onset   Cancer Other        breast, porstate   Hypertension Other    Stroke Other    Breast cancer Mother    Diverticulitis Mother    Heart disease Father    Prostate cancer Father    Stroke Father    Dementia Sister    Colon cancer Neg Hx    Social History   Socioeconomic History   Marital status: Divorced    Spouse name: Not on file   Number of children: Not on file   Years of education: Not on file   Highest education level: Not on file  Occupational History   Occupation: retired  Tobacco Use   Smoking status: Never   Smokeless tobacco: Never  Vaping Use   Vaping Use: Never used  Substance and Sexual Activity   Alcohol use: Not Currently    Comment: occ   Drug use: Yes    Types: Oxycodone   Sexual activity: Not on file  Other Topics Concern   Not on file  Social History Narrative   Not on file   Social Determinants of Health   Financial Resource Strain: Low Risk    Difficulty of Paying Living Expenses: Not hard at all  Food Insecurity: No Food Insecurity   Worried About Charity fundraiser in the Last Year: Never true   [place] in the Last Year: Never true  Transportation Needs: No Transportation  Needs   Lack of Transportation (Medical): No   Lack of Transportation (Non-Medical): No  Physical Activity: Inactive   Days of Exercise per Week: 0 days   Minutes of Exercise per Session: 0 min  Stress: No Stress Concern Present   Feeling of Stress : Not at all  Social Connections: Not on file    Tobacco Counseling Counseling given: Not Answered   Clinical Intake:  Pre-visit preparation completed: Yes  Pain : 0-10 Pain Score: 4  Pain Type: Chronic pain Pain Location: Back Pain Orientation: Lower Pain Descriptors / Indicators: Constant Pain  Onset: More than a month ago Pain Frequency: Constant     Nutritional Status: BMI > 30  Obese Nutritional Risks: None Diabetes: No  How often do you need to have someone help you when you read instructions, pamphlets, or other written materials from your doctor or pharmacy?: 1 - Never What is the last grade level you completed in school?: 51yrs college  Diabetic? no  Interpreter Needed?: No  Information entered by :: NAllen LPN   Activities of Daily Living In your present state of health, do you have any difficulty performing the following activities: 11/12/2021 08/23/2021  Hearing? Tempie Donning  Comment needs hearing aides -  Vision? Y N  Comment farsighted -  Difficulty concentrating or making decisions? N N  Walking or climbing stairs? N N  Dressing or bathing? N N  Doing errands, shopping? N N  Using the Toilet? N -  In the past six months, have you accidently leaked urine? Y -  Comment with sneeze -  Do you have problems with loss of bowel control? N -  Managing your Medications? N -  Managing your Finances? N -  Housekeeping or managing your Housekeeping? N -  Some recent data might be hidden    Patient Care Team: [name], MD as PCP - General [name], MD as PCP - Cardiology (Cardiology) Viona Gilmore, [facility] as Pharmacist (Pharmacist)  Indicate any recent Medical Services you may have received from  other than [facility] providers in the past year (date may be approximate).     Assessment:   This is a routine wellness examination for [place].  Hearing/Vision screen Vision Screening - Comments:: Regular eye exams, Dr. Herbert Deaner  Dietary issues and exercise activities discussed: Current Exercise Habits: The patient does not participate in regular exercise at present   Goals Addressed             This Visit's Progress    Patient Stated       11/12/2021, wants to lose 20-30 pounds       Depression Screen PHQ 2/9 Scores 11/12/2021 10/31/2021 11/08/2020 08/22/2020 07/15/2018 03/07/2016 01/23/2015  PHQ - 2 Score 0 - 0 0 0 0 0  PHQ- 9 Score - - - 0 - - -  Exception Documentation - Patient refusal - Medical reason - - -    Fall Risk Fall Risk  11/12/2021 10/31/2021 11/08/2020 08/22/2020 07/15/2018  Falls in the past year? 0 Exclusion - non ambulatory 0 0 No  Number falls in past yr: - - 0 0 -  Injury with Fall? - - 0 0 -  Risk for fall due to : Medication side effect;Impaired balance/gait - - - -  Follow up Falls evaluation completed;Education provided;Falls prevention discussed - Falls prevention discussed - -    FALL RISK PREVENTION PERTAINING TO THE HOME:  Any stairs in or around the home? Yes  If so, are there any without handrails? Yes  Home free of loose throw rugs in walkways, pet beds, electrical cords, etc? Yes  Adequate lighting in your home to reduce risk of falls? Yes   ASSISTIVE DEVICES UTILIZED TO PREVENT FALLS:  Life alert? No  Use of a cane, walker or w/c? No  Grab bars in the bathroom? No  Shower chair or bench in shower? No  Elevated toilet seat or a handicapped toilet? Yes   TIMED UP AND GO:  Was the test performed? No .      Cognitive Function:  6CIT Screen 11/12/2021 11/08/2020  What Year? 0 points 0 points  What month? 0 points 0 points  What time? 0 points -  Count back from 20 0 points 0 points  Months in reverse 2 points 0 points  Repeat phrase 0  points 0 points  Total Score 2 -    Immunizations Immunization History  Administered Date(s) Administered   Fluad Quad(high Dose 65+) 08/22/2020, 07/30/2021   Influenza Split 09/05/2011, 11/09/2012   Influenza Whole 10/04/2009, 09/24/2010   Influenza, High Dose Seasonal PF 07/17/2015, 09/01/2017, 07/15/2018, 07/28/2019   Influenza,inj,Quad PF,6+ Mos 11/24/2013, 11/16/2014   Influenza-Unspecified 07/28/2019   PFIZER(Purple Top)SARS-COV-2 Vaccination 11/19/2019, 12/10/2019, 09/26/2020   Pneumococcal Conjugate-13 07/17/2015   Pneumococcal Polysaccharide-23 10/04/2009   Pneumococcal-Unspecified 09/03/2017   Td 03/10/2009   Tdap 07/18/2018   Zoster Recombinat (Shingrix) 06/05/2021, 08/21/2021   Zoster, Live 09/05/2011    TDAP status: Up to date  Flu Vaccine status: Up to date  Pneumococcal vaccine status: Up to date  Covid-19 vaccine status: Completed vaccines  Qualifies for Shingles Vaccine? Yes   Zostavax completed Yes   Shingrix Completed?: Yes  Screening Tests Health Maintenance  Topic Date Due   COVID-19 Vaccine (4 - Booster for Pfizer series) 11/21/2020   Hepatitis C Screening  05/22/2022 (Originally 01/24/1964)   TETANUS/TDAP  07/18/2028   Pneumonia Vaccine 61+ Years old  Completed   INFLUENZA VACCINE  Completed   Zoster Vaccines- Shingrix  Completed   HPV VACCINES  Aged Out   COLONOSCOPY (Pts 45-74yrs Insurance coverage will need to be confirmed)  [place] Maintenance Due  Topic Date Due   COVID-19 Vaccine (4 - Booster for [place] series) 11/21/2020    Colorectal cancer screening: No longer required.   Lung Cancer Screening: (Low Dose CT Chest recommended if Age 23-80 years, 30 pack-year currently smoking OR have quit w/in 15years.) does not qualify.   Lung Cancer Screening Referral: no  Additional Screening:  Hepatitis C Screening: does qualify;   Vision Screening: Recommended annual ophthalmology exams for early  detection of glaucoma and other disorders of the eye. Is the patient up to date with their annual eye exam?  Yes  Who is the provider or what is the name of the office in which the patient attends annual eye exams? Dr. Herbert Deaner If pt is not established with a provider, would they like to be referred to a provider to establish care? No .   Dental Screening: Recommended annual dental exams for proper oral hygiene  Community Resource Referral / Chronic Care Management: CRR required this visit?  No   CCM required this visit?  No      Plan:     I have personally reviewed and noted the following in the patients chart:   Medical and social history Use of alcohol, tobacco or illicit drugs  Current medications and supplements including opioid prescriptions. Patient is currently taking opioid prescriptions. Information provided to patient regarding non-opioid alternatives. Patient advised to discuss non-opioid treatment plan with their provider. Functional ability and status Nutritional status Physical activity Advanced directives List of other physicians Hospitalizations, surgeries, and ER visits in previous 12 months Vitals Screenings to include cognitive, depression, and falls Referrals and appointments  In addition, I have reviewed and discussed with patient certain preventive protocols, quality metrics, and best practice recommendations. A written personalized care plan for preventive services as well as general preventive health recommendations were provided to patient.     Marissa Calamity  Zenia Resides, [place]   06/13/227   Nurse Notes: none

## 2021-11-12 NOTE — Patient Instructions (Signed)
Ricky Meyers , Thank you for taking time to come for your Medicare Wellness Visit. I appreciate your ongoing commitment to your health goals. Please review the following plan we discussed and let me know if I can assist you in the future.   Screening recommendations/referrals: Colonoscopy: not required Recommended yearly ophthalmology/optometry visit for glaucoma screening and checkup Recommended yearly dental visit for hygiene and checkup  Vaccinations: Influenza vaccine: completed 07/30/2021 Pneumococcal vaccine: completed 09/03/2017 Tdap vaccine: completed 07/18/2018, due 07/18/2028 Shingles vaccine: completed   Covid-19:  09/26/2020, 12/10/2019, 11/19/2019  Advanced directives: Please bring a copy of your POA (Power of Attorney) and/or Living Will to your next appointment.   Conditions/risks identified: none  Next appointment: Follow up in one year for your annual wellness visit.   Preventive Care 49 Years and Older, Male Preventive care refers to lifestyle choices and visits with your health care provider that can promote health and wellness. What does preventive care include? A yearly physical exam. This is also called an annual well check. Dental exams once or twice a year. Routine eye exams. Ask your health care provider how often you should have your eyes checked. Personal lifestyle choices, including: Daily care of your teeth and gums. Regular physical activity. Eating a healthy diet. Avoiding tobacco and drug use. Limiting alcohol use. Practicing safe sex. Taking low doses of aspirin every day. Taking vitamin and mineral supplements as recommended by your health care provider. What happens during an annual well check? The services and screenings done by your health care provider during your annual well check will depend on your age, overall health, lifestyle risk factors, and family history of disease. Counseling  Your health care provider may ask you questions about  your: Alcohol use. Tobacco use. Drug use. Emotional well-being. Home and relationship well-being. Sexual activity. Eating habits. History of falls. Memory and ability to understand (cognition). Work and work Statistician. Screening  You may have the following tests or measurements: Height, weight, and BMI. Blood pressure. Lipid and cholesterol levels. These may be checked every 5 years, or more frequently if you are over 70 years old. Skin check. Lung cancer screening. You may have this screening every year starting at age 35 if you have a 30-pack-year history of smoking and currently smoke or have quit within the past 15 years. Fecal occult blood test (FOBT) of the stool. You may have this test every year starting at age 18. Flexible sigmoidoscopy or colonoscopy. You may have a sigmoidoscopy every 5 years or a colonoscopy every 10 years starting at age 17. Prostate cancer screening. Recommendations will vary depending on your family history and other risks. Hepatitis C blood test. Hepatitis B blood test. Sexually transmitted disease (STD) testing. Diabetes screening. This is done by checking your blood sugar (glucose) after you have not eaten for a while (fasting). You may have this done every 1-3 years. Abdominal aortic aneurysm (AAA) screening. You may need this if you are a current or former smoker. Osteoporosis. You may be screened starting at age 19 if you are at high risk. Talk with your health care provider about your test results, treatment options, and if necessary, the need for more tests. Vaccines  Your health care provider may recommend certain vaccines, such as: Influenza vaccine. This is recommended every year. Tetanus, diphtheria, and acellular pertussis (Tdap, Td) vaccine. You may need a Td booster every 10 years. Zoster vaccine. You may need this after age 30. Pneumococcal 13-valent conjugate (PCV13) vaccine. One dose is recommended  after age 16. Pneumococcal  polysaccharide (PPSV23) vaccine. One dose is recommended after age 25. Talk to your health care provider about which screenings and vaccines you need and how often you need them. This information is not intended to replace advice given to you by your health care provider. Make sure you discuss any questions you have with your health care provider. Document Released: 11/17/2015 Document Revised: 07/10/2016 Document Reviewed: 08/22/2015 Elsevier Interactive Patient Education  2017 [place] Prevention in the Home Falls can cause injuries. They can happen to people of all ages. There are many things you can do to make your home safe and to help prevent falls. What can I do on the outside of my home? Regularly fix the edges of walkways and driveways and fix any cracks. Remove anything that might make you trip as you walk through a door, such as a raised step or threshold. Trim any bushes or trees on the path to your home. Use bright outdoor lighting. Clear any walking paths of anything that might make someone trip, such as rocks or tools. Regularly check to see if handrails are loose or broken. Make sure that both sides of any steps have handrails. Any raised decks and porches should have guardrails on the edges. Have any leaves, snow, or ice cleared regularly. Use sand or salt on walking paths during winter. Clean up any spills in your garage right away. This includes oil or grease spills. What can I do in the bathroom? Use night lights. Install grab bars by the toilet and in the tub and shower. Do not use towel bars as grab bars. Use non-skid mats or decals in the tub or shower. If you need to sit down in the shower, use a plastic, non-slip stool. Keep the floor dry. Clean up any water that spills on the floor as soon as it happens. Remove soap buildup in the tub or shower regularly. Attach bath mats securely with double-sided non-slip rug tape. Do not have throw rugs and other  things on the floor that can make you trip. What can I do in the bedroom? Use night lights. Make sure that you have a light by your bed that is easy to reach. Do not use any sheets or blankets that are too big for your bed. They should not hang down onto the floor. Have a firm chair that has side arms. You can use this for support while you get dressed. Do not have throw rugs and other things on the floor that can make you trip. What can I do in the kitchen? Clean up any spills right away. Avoid walking on wet floors. Keep items that you use a lot in easy-to-reach places. If you need to reach something above you, use a strong step stool that has a grab bar. Keep electrical cords out of the way. Do not use floor polish or wax that makes floors slippery. If you must use wax, use non-skid floor wax. Do not have throw rugs and other things on the floor that can make you trip. What can I do with my stairs? Do not leave any items on the stairs. Make sure that there are handrails on both sides of the stairs and use them. Fix handrails that are broken or loose. Make sure that handrails are as long as the stairways. Check any carpeting to make sure that it is firmly attached to the stairs. Fix any carpet that is loose or worn. Avoid having throw  rugs at the top or bottom of the stairs. If you do have throw rugs, attach them to the floor with carpet tape. Make sure that you have a light switch at the top of the stairs and the bottom of the stairs. If you do not have them, ask someone to add them for you. What else can I do to help prevent falls? Wear shoes that: Do not have high heels. Have rubber bottoms. Are comfortable and fit you well. Are closed at the toe. Do not wear sandals. If you use a stepladder: Make sure that it is fully opened. Do not climb a closed stepladder. Make sure that both sides of the stepladder are locked into place. Ask someone to hold it for you, if possible. Clearly  mark and make sure that you can see: Any grab bars or handrails. First and last steps. Where the edge of each step is. Use tools that help you move around (mobility aids) if they are needed. These include: Canes. Walkers. Scooters. Crutches. Turn on the lights when you go into a dark area. Replace any light bulbs as soon as they burn out. Set up your furniture so you have a clear path. Avoid moving your furniture around. If any of your floors are uneven, fix them. If there are any pets around you, be aware of where they are. Review your medicines with your doctor. Some medicines can make you feel dizzy. This can increase your chance of falling. Ask your doctor what other things that you can do to help prevent falls. This information is not intended to replace advice given to you by your health care provider. Make sure you discuss any questions you have with your health care provider. Document Released: 08/17/2009 Document Revised: 03/28/2016 Document Reviewed: 11/25/2014 Elsevier Interactive Patient Education  2017 [employer].

## 2021-11-12 NOTE — Addendum Note (Signed)
Addended by: Glenna Durand E on: 11/12/2021 11:05 AM   Modules accepted: Orders

## 2021-11-13 ENCOUNTER — Other Ambulatory Visit: Payer: Self-pay

## 2021-11-13 ENCOUNTER — Ambulatory Visit (HOSPITAL_COMMUNITY): Payer: Medicare Other | Attending: Cardiology

## 2021-11-13 ENCOUNTER — Ambulatory Visit (HOSPITAL_BASED_OUTPATIENT_CLINIC_OR_DEPARTMENT_OTHER): Payer: Medicare Other

## 2021-11-13 DIAGNOSIS — Z01818 Encounter for other preprocedural examination: Secondary | ICD-10-CM

## 2021-11-13 DIAGNOSIS — R0609 Other forms of dyspnea: Secondary | ICD-10-CM | POA: Insufficient documentation

## 2021-11-13 DIAGNOSIS — Z01810 Encounter for preprocedural cardiovascular examination: Secondary | ICD-10-CM | POA: Diagnosis not present

## 2021-11-13 DIAGNOSIS — I341 Nonrheumatic mitral (valve) prolapse: Secondary | ICD-10-CM

## 2021-11-13 DIAGNOSIS — I340 Nonrheumatic mitral (valve) insufficiency: Secondary | ICD-10-CM | POA: Diagnosis not present

## 2021-11-13 LAB — MYOCARDIAL PERFUSION IMAGING
LV dias vol: 119 mL (ref 62–150)
LV sys vol: 50 mL
Nuc Stress EF: 59 %
Peak HR: 85 {beats}/min
Rest HR: 78 {beats}/min
Rest Nuclear Isotope Dose: 10.3 mCi
SDS: 1
SRS: 0
SSS: 1
Stress Nuclear Isotope Dose: 32.6 mCi
TID: 1.12

## 2021-11-13 LAB — ECHOCARDIOGRAM COMPLETE
Area-P 1/2: 3.65 cm2
Height: 72 in
MV M vel: 5.34 m/s
MV Peak grad: 114.2 mmHg
Radius: 1 cm
S' Lateral: 3.2 cm
Weight: 3792 oz

## 2021-11-13 MED ORDER — REGADENOSON 0.4 MG/5ML IV SOLN
0.4000 mg | Freq: Once | INTRAVENOUS | Status: AC
  Administered 2021-11-13: 0.4 mg via INTRAVENOUS

## 2021-11-13 MED ORDER — TECHNETIUM TC 99M TETROFOSMIN IV KIT
10.3000 | PACK | Freq: Once | INTRAVENOUS | Status: AC | PRN
  Administered 2021-11-13: 10.3 via INTRAVENOUS
  Filled 2021-11-13: qty 11

## 2021-11-13 MED ORDER — TECHNETIUM TC 99M TETROFOSMIN IV KIT
32.6000 | PACK | Freq: Once | INTRAVENOUS | Status: AC | PRN
  Administered 2021-11-13: 32.6 via INTRAVENOUS
  Filled 2021-11-13: qty 33

## 2021-11-14 ENCOUNTER — Ambulatory Visit: Payer: Medicare Other

## 2021-11-14 ENCOUNTER — Other Ambulatory Visit: Payer: Self-pay | Admitting: Radiology

## 2021-11-15 ENCOUNTER — Other Ambulatory Visit: Payer: Self-pay

## 2021-11-15 ENCOUNTER — Encounter (HOSPITAL_COMMUNITY): Payer: Self-pay

## 2021-11-15 ENCOUNTER — Ambulatory Visit (HOSPITAL_COMMUNITY)
Admission: RE | Admit: 2021-11-15 | Discharge: 2021-11-15 | Disposition: A | Discharge status: Home / Self Care | Payer: Medicare Other | Source: Ambulatory Visit | Attending: Interventional Radiology | Admitting: Interventional Radiology

## 2021-11-15 DIAGNOSIS — G4733 Obstructive sleep apnea (adult) (pediatric): Secondary | ICD-10-CM | POA: Insufficient documentation

## 2021-11-15 DIAGNOSIS — Z86718 Personal history of other venous thrombosis and embolism: Secondary | ICD-10-CM | POA: Diagnosis not present

## 2021-11-15 DIAGNOSIS — Z7901 Long term (current) use of anticoagulants: Secondary | ICD-10-CM | POA: Diagnosis not present

## 2021-11-15 DIAGNOSIS — M545 Low back pain, unspecified: Secondary | ICD-10-CM | POA: Insufficient documentation

## 2021-11-15 DIAGNOSIS — E785 Hyperlipidemia, unspecified: Secondary | ICD-10-CM | POA: Diagnosis not present

## 2021-11-15 DIAGNOSIS — I10 Essential (primary) hypertension: Secondary | ICD-10-CM | POA: Insufficient documentation

## 2021-11-15 DIAGNOSIS — I82409 Acute embolism and thrombosis of unspecified deep veins of unspecified lower extremity: Secondary | ICD-10-CM | POA: Diagnosis not present

## 2021-11-15 HISTORY — PX: IR IVC FILTER PLMT / S&I /IMG GUID/MOD SED: IMG701

## 2021-11-15 LAB — PROTIME-INR
INR: 0.9 (ref 0.8–1.2)
Prothrombin Time: 12.4 seconds (ref 11.4–15.2)

## 2021-11-15 LAB — BASIC METABOLIC PANEL
Anion gap: 12 (ref 5–15)
BUN: 15 mg/dL (ref 8–23)
CO2: 26 mmol/L (ref 22–32)
Calcium: 10 mg/dL (ref 8.9–10.3)
Chloride: 102 mmol/L (ref 98–111)
Creatinine, Ser: 1.03 mg/dL (ref 0.61–1.24)
GFR, Estimated: >60 mL/min (ref >60)
Glucose, Bld: 102 mg/dL — ABNORMAL HIGH (ref 70–99)
Potassium: 4.1 mmol/L (ref 3.5–5.1)
Sodium: 140 mmol/L (ref 135–145)

## 2021-11-15 LAB — CBC
HCT: 40.4 % (ref 39.0–52.0)
Hemoglobin: 13.7 g/dL (ref 13.0–17.0)
MCH: 31.6 pg (ref 26.0–34.0)
MCHC: 33.9 g/dL (ref 30.0–36.0)
MCV: 93.1 fL (ref 80.0–100.0)
Platelets: 269 10*3/uL (ref 150–400)
RBC: 4.34 MIL/uL (ref 4.22–5.81)
RDW: 14.6 % (ref 11.5–15.5)
WBC: 8.2 10*3/uL (ref 4.0–10.5)
nRBC: 0 % (ref 0.0–0.2)

## 2021-11-15 MED ORDER — MIDAZOLAM HCL 2 MG/2ML IJ SOLN
INTRAMUSCULAR | Status: AC | PRN
  Administered 2021-11-15: 1 mg via INTRAVENOUS

## 2021-11-15 MED ORDER — LIDOCAINE HCL (PF) 1 % IJ SOLN
INTRAMUSCULAR | Status: AC | PRN
  Administered 2021-11-15: 5 mL

## 2021-11-15 MED ORDER — MIDAZOLAM HCL 2 MG/2ML IJ SOLN
INTRAMUSCULAR | Status: AC
  Filled 2021-11-15: qty 4

## 2021-11-15 MED ORDER — SODIUM CHLORIDE 0.9 % IV SOLN: INTRAVENOUS | Status: DC

## 2021-11-15 MED ORDER — FENTANYL CITRATE (PF) 100 MCG/2ML IJ SOLN
INTRAMUSCULAR | Status: AC | PRN
Start: 2021-11-15 — End: 2021-11-15
  Administered 2021-11-15: 50 ug via INTRAVENOUS

## 2021-11-15 MED ORDER — IOHEXOL 300 MG/ML  SOLN
100.0000 mL | Freq: Once | INTRAMUSCULAR | Status: AC | PRN
  Administered 2021-11-15: 55 mL via INTRAVENOUS

## 2021-11-15 MED ORDER — FENTANYL CITRATE (PF) 100 MCG/2ML IJ SOLN
INTRAMUSCULAR | Status: AC
  Filled 2021-11-15: qty 4

## 2021-11-15 MED ORDER — LIDOCAINE HCL 1 % IJ SOLN
INTRAMUSCULAR | Status: AC
  Filled 2021-11-15: qty 20

## 2021-11-15 NOTE — H&P (Signed)
Chief Complaint: Patient was seen in consultation today for IVC fllter placement at the request of Jones,David E  Referring Physician(s): Elberta Spaniel  Supervising Physician: Jacqulynn Cadet  Patient Status: [facility] - Out-pt  History of Present Illness: Ricky Meyers is a 76 y.o. male with PMHs of HTN, HLD, OSA, back pain, and DVT/PE in August 2020, thought to be high risk for recurrence. Patient was on Xarelto after diagnosis of the DVT/PE, which was changed to Eliquis.  Patient is scheduled for back surgery due to chronic back pain, which required discontinuation of AC/AP. Patient was referred to VIR for possible IVC filter and had a consultation visit with Dr. Earleen Newport on 10/30/21.  He was deemed a good candidate for IVC filter placement, and he presents to [facility] IR today for the procedure.   Patient laying in bed, not in acute distress.  Denise headache, fever, chills, shortness of breath, cough, chest pain, abdominal pain, nausea ,vomiting, and bleeding.  Patient states that he was told he has 'leakage' in one of his hear valve by his cardiologist, underwent echocardiogram on 11/13/21. Of note, echo showed severe MV regurgitation. OK to proceed with the back surgery and to follow up with Dr. Irish Lack for possible intervention for the MV regurg per his note.    Past Medical History:  Diagnosis Date   Arthritis    neck and back    Chronic neck pain    Depression    ED (erectile dysfunction)    GERD (gastroesophageal reflux disease)    dysphagia   Gout    sees Dr. Leigh Aurora    Hyperlipidemia    Hypertension    OSA (obstructive sleep apnea) 12/18/2017   Severe with AHI at 70.1/hr and is now on CPAP at 11cm H2o   Prostate cancer [facility])    history of   Pulmonary embolism ([place]) 06/2019   Sleep apnea    snoring/never checked    Past Surgical History:  Procedure Laterality Date   AMPUTATION Left 07/18/2018   Procedure: LEFT RING FINGER REVISION AMPUTATION;  Surgeon: Leanora Cover, MD;  Location: [place];  Service: Orthopedics;  Laterality: Left;   CERVICAL FUSION  12/02/07   per Dr. Sherley Bounds   COLONOSCOPY  03/11/2017   per Dr. Ardis Hughs, clear, no repeats needed    injection lower back Right 09/17/2016   IR RADIOLOGIST EVAL & MGMT  10/30/2021   PROSTATECTOMY     ROTATOR CUFF REPAIR Right 11/2017   TONSILLECTOMY     VASECTOMY      Allergies: Patient has no known allergies.  Medications: Prior to Admission medications   Medication Sig Start Date End Date Taking? Authorizing Provider  allopurinol (ZYLOPRIM) 300 MG tablet Take 1 tablet (300 mg total) by mouth daily. 10/19/21  Yes [name], MD  apixaban (ELIQUIS) 5 MG TABS tablet Take 1 tablet (5 mg total) by mouth 2 (two) times daily. 11/01/21  Yes [name], MD  atorvastatin (LIPITOR) 40 MG tablet Take 1 tablet (40 mg total) by mouth daily. 10/19/21  Yes [name], MD  cyclobenzaprine (FLEXERIL) 10 MG tablet TAKE 1 TABLET BY MOUTH 3 TIMES DAILY AS NEEDED FOR MUSCLE SPASMS 10/22/21  Yes [name], MD  famotidine (PEPCID) 20 MG tablet Take 20 mg by mouth daily. After supper   Yes [provider]  gabapentin (NEURONTIN) 100 MG capsule Take 1 capsule (100 mg total) by mouth 4 (four) times daily. 08/23/21  Yes Alysia Penna  A, MD  oxyCODONE-acetaminophen (PERCOCET) 10-325 MG tablet Take 1 tablet by mouth every 6 (six) hours as needed for pain. 01/04/22 02/03/22 Yes [name], MD  PARoxetine (PAXIL) 20 MG tablet Take 1 tablet (20 mg total) by mouth every morning. 10/19/21  Yes [name], MD  psyllium (METAMUCIL) 58.6 % packet Take 1 packet by mouth daily as needed (for constipation).    Yes [provider]  telmisartan-hydrochlorothiazide (MICARDIS HCT) 80-25 MG tablet TAKE 1 TABLET BY MOUTH EVERY DAY 07/02/21  Yes Tanda Rockers, MD  diazepam (VALIUM) 5 MG tablet Take 1 tablet (5 mg total) by mouth every 12 (twelve) hours as needed for anxiety. Patient not taking: Reported  on 11/12/2021 07/06/21   [name], MD  rivaroxaban (XARELTO) 20 MG TABS tablet Take 1 tablet (20 mg total) by mouth daily with supper. Patient not taking: Reported on 11/12/2021 10/08/21   [name], MD     Family History  Problem Relation Age of Onset   Cancer Other        breast, porstate   Hypertension Other    Stroke Other    Breast cancer Mother    Diverticulitis Mother    Heart disease Father    Prostate cancer Father    Stroke Father    Dementia Sister    Colon cancer Neg Hx     Social History   Socioeconomic History   Marital status: Divorced    Spouse name: Not on file   Number of children: Not on file   Years of education: Not on file   Highest education level: Not on file  Occupational History   Occupation: retired  Tobacco Use   Smoking status: Never   Smokeless tobacco: Never  Vaping Use   Vaping Use: Never used  Substance and Sexual Activity   Alcohol use: Not Currently    Comment: occ   Drug use: Yes    Types: Oxycodone   Sexual activity: Not on file  Other Topics Concern   Not on file  Social History Narrative   Not on file   Social Determinants of Health   Financial Resource Strain: Low Risk    Difficulty of Paying Living Expenses: Not hard at all  Food Insecurity: No Food Insecurity   Worried About Charity fundraiser in the Last Year: Never true   [place] in the Last Year: Never true  Transportation Needs: No Transportation Needs   Lack of Transportation (Medical): No   Lack of Transportation (Non-Medical): No  Physical Activity: Inactive   Days of Exercise per Week: 0 days   Minutes of Exercise per Session: 0 min  Stress: No Stress Concern Present   Feeling of Stress : Not at all  Social Connections: Not on file     Review of Systems: A 12 point ROS discussed and pertinent positives are indicated in the HPI above.  All other systems are negative.  Vital Signs: BP (!) 132/93 (BP Location: Right Arm)    Pulse 92    Temp  97.7 F (36.5 C) (Oral)    Resp 18    Ht 6' (1.829 m)    Wt 237 lb (107.5 kg)    SpO2 99%    BMI 32.14 kg/m    Physical Exam Vitals and nursing note reviewed.  Constitutional:      General: Patient is not in acute distress.    Appearance: Normal appearance. Patient is not ill-appearing.  HENT:     Head: Normocephalic and atraumatic.     Mouth/Throat:     Mouth: Mucous membranes are moist.     Pharynx: Oropharynx is clear.  Cardiovascular:     Rate and Rhythm: Normal rate and regular rhythm.     Pulses: Normal pulses.     Heart sounds: Normal heart sounds.  Pulmonary:     Effort: Pulmonary effort is normal.     Breath sounds: Normal breath sounds.  Abdominal:     General: Abdomen is flat. Bowel sounds are normal.     Palpations: Abdomen is soft.  Musculoskeletal:     Cervical back: Neck supple.  Skin:    General: Skin is warm and dry.     Coloration: Skin is not jaundiced or pale.  Neurological:     Mental Status: Patient is alert and oriented to person, place, and time.  Psychiatric:        Mood and Affect: Mood normal.        Behavior: Behavior normal.        Judgment: Judgment normal.    MD Evaluation Airway: WNL Heart: WNL Abdomen: WNL Chest/ Lungs: WNL ASA  Classification: 2 Mallampati/Airway Score: One  Imaging: MYOCARDIAL PERFUSION IMAGING  Result Date: 11/13/2021   EKG shows no changes during Lexiscan stress   Myoview scan with probable normal perfusion and mild soft tissue attenuation (diaphragm)  No ischemia or scar   LVEF 59%.   Low risk study   No significant change from study from 2018.   ECHOCARDIOGRAM COMPLETE  Result Date: 11/13/2021    ECHOCARDIOGRAM REPORT   Patient Name:   Ricky Meyers Date of Exam: 11/13/2021 Medical Rec #:  [id number]       Height:       72.0 in Accession #:    [id number]      Weight:       237.0 lb Date of Birth:  04-21-1946       BSA:          2.290 m Patient Age:    42 years        BP:           144/79 mmHg Patient Gender:  M               HR:           68 bpm. Exam Location:  [address] Procedure: 2D Echo, 3D Echo, Cardiac Doppler, Color Doppler and Strain Analysis Indications:    I05.9 Mitral valve disease  History:        Patient has prior history of Echocardiogram examinations, most                 recent 04/20/2020. Arrythmias:LBBB, Signs/Symptoms:Shortness of                 Breath; Risk Factors:Hypertension, Sleep Apnea and Dyslipidemia.                 Pulmonary embolus. H/o prostate cancer. Anemia. COVID-19.  Sonographer:    Basilia Jumbo BS, RDCS Referring Phys: [place]  1. Severe mitral regurgitation.  2. Left ventricular ejection fraction, by estimation, is 55 to 60%. The left ventricle has normal function. The left ventricle has no regional wall motion abnormalities. Left ventricular diastolic parameters are consistent with Grade I diastolic dysfunction (impaired relaxation).  3. Right ventricular systolic function is normal. The right ventricular size is normal.  4. Left atrial size was  mildly dilated.  5. PISA radius: 1.4 cm     MR ERO: 0.73 cm2     MR Volume: 123 ml. The mitral valve is normal in structure. Severe mitral valve regurgitation. No evidence of mitral stenosis. There is moderate late systolic prolapse of the middle scallop of the posterior leaflet of the mitral valve.  6. The aortic valve is normal in structure. Aortic valve regurgitation is not visualized. No aortic stenosis is present.  7. The inferior vena cava is normal in size with greater than 50% respiratory variability, suggesting right atrial pressure of 3 mmHg. Comparison(s): Prior images reviewed side by side. 04/20/20 EF 55-60%. Moderate MR. Has progressed from prior. FINDINGS  Left Ventricle: Left ventricular ejection fraction, by estimation, is 55 to 60%. The left ventricle has normal function. The left ventricle has no regional wall motion abnormalities. 3D left ventricular ejection fraction analysis performed but  not reported based on interpreter judgement due to suboptimal tracking. The left ventricular internal cavity size was normal in size. There is no left ventricular hypertrophy. Left ventricular diastolic parameters are consistent with Grade I diastolic dysfunction (impaired relaxation). Right Ventricle: The right ventricular size is normal. No increase in right ventricular wall thickness. Right ventricular systolic function is normal. Left Atrium: Left atrial size was mildly dilated. Right Atrium: Right atrial size was normal in size. Pericardium: There is no evidence of pericardial effusion. Mitral Valve: PISA radius: 1.4 cm MR ERO: 0.73 cm2 MR Volume: 123 ml. The mitral valve is normal in structure. There is moderate late systolic prolapse of the middle scallop of the posterior leaflet of the mitral valve. Severe mitral valve regurgitation, with eccentric anteriorly directed jet. No evidence of mitral valve stenosis. Tricuspid Valve: The tricuspid valve is normal in structure. Tricuspid valve regurgitation is not demonstrated. No evidence of tricuspid stenosis. Aortic Valve: The aortic valve is normal in structure. Aortic valve regurgitation is not visualized. No aortic stenosis is present. Pulmonic Valve: The pulmonic valve was normal in structure. Pulmonic valve regurgitation is not visualized. No evidence of pulmonic stenosis. Aorta: The aortic root is normal in size and structure. Venous: The inferior vena cava is normal in size with greater than 50% respiratory variability, suggesting right atrial pressure of 3 mmHg. IAS/Shunts: No atrial level shunt detected by color flow Doppler. Additional Comments: Severe mitral regurgitation.  LEFT VENTRICLE PLAX 2D LVIDd:         4.50 cm   Diastology LVIDs:         3.20 cm   LV e' medial:    6.53 cm/s LV PW:         1.20 cm   LV E/e' medial:  15.6 LV IVS:        1.00 cm   LV e' lateral:   10.10 cm/s LVOT diam:     2.60 cm   LV E/e' lateral: 10.1 LV SV:         88 LV SV  Index:   38        2D Longitudinal Strain LVOT Area:     5.31 cm  2D Strain GLS (A2C):   -16.7 %                          2D Strain GLS (A3C):   -21.3 %                          2D Strain GLS (  A4C):   -20.9 %                          2D Strain GLS Avg:     -19.6 %                           3D Volume EF:                          3D EF:        39 %                          LV EDV:       135 ml                          LV ESV:       82 ml                          LV SV:        53 ml RIGHT VENTRICLE            IVC RV Basal diam:  3.60 cm    IVC diam: 1.60 cm RV S prime:     8.05 cm/s TAPSE (M-mode): 2.1 cm LEFT ATRIUM             Index        RIGHT ATRIUM           Index LA diam:        4.50 cm 1.97 cm/m   RA Pressure: 3.00 mmHg LA Vol (A2C):   54.4 ml 23.76 ml/m  RA Area:     15.40 cm LA Vol (A4C):   78.4 ml 34.24 ml/m  RA Volume:   35.80 ml  15.63 ml/m LA Biplane Vol: 67.3 ml 29.39 ml/m  AORTIC VALVE LVOT Vmax:   96.40 cm/s LVOT Vmean:  52.900 cm/s LVOT VTI:    0.165 m  AORTA Ao Root diam: 4.00 cm Ao Asc diam:  3.80 cm MITRAL VALVE                  TRICUSPID VALVE                               Estimated RAP:  3.00 mmHg MV Decel Time: 208 msec MR Peak grad:    114.2 mmHg   SHUNTS MR Mean grad:    80.3 mmHg    Systemic VTI:  0.16 m MR Vmax:         534.33 cm/s  Systemic Diam: 2.60 cm MR Vmean:        427.7 cm/s MR PISA:         6.28 cm MR PISA Eff ROA: 39 mm MR PISA Radius:  1.00 cm MV E velocity: 102.00 cm/s MV A velocity: 95.90 cm/s MV E/A ratio:  1.06 Candee Furbish MD Electronically signed by Candee Furbish MD Signature Date/Time: 11/13/2021/1:25:08 PM    Final    IR Radiologist Eval & Mgmt  Result Date: 10/30/2021 Please refer to notes tab for details about interventional procedure. (Op Note)   Labs:  CBC: Recent Labs    12/04/20 1423 08/23/21 0940 11/15/21 0815  WBC 5.9 8.3 8.2  HGB 13.3 13.3 13.7  HCT 38.8* 39.1 40.4  PLT 151.0 178.0 269    COAGS: Recent Labs    11/15/21 0815  INR 0.9     BMP: Recent Labs    08/23/21 0940 11/15/21 0815  NA 138 140  K 3.8 4.1  CL 100 102  CO2 29 26  GLUCOSE 94 102*  BUN 20 15  CALCIUM 9.6 10.0  CREATININE 1.25 1.03  GFRNONAA  --  >60    LIVER FUNCTION TESTS: Recent Labs    08/23/21 0940  BILITOT 1.1  AST 17  ALT 15  ALKPHOS 58  PROT 6.7  ALBUMIN 4.2    TUMOR MARKERS: No results for input(s): AFPTM, CEA, CA199, CHROMGRNA in the last 8760 hours.  Assessment and Plan: 76 y.o. male with hx of unprovoked DVT/PE in August 2020 on Eliquis who is scheduled for L3, L4 laminectomy which requires prolonged d/c of AC/AP, in need of IVC filter.  Patient had a consultation visit with Dr. Earleen Newport on 10/30/21 and was deemed good candidate for IVC filter placement.   NPO since MN VSS CBC, INR, BMP all normal  On Eliquis, last taken on 11/13/21 per patient.   Risks and benefits discussed with the patient including, but not limited to bleeding, infection, contrast induced renal failure, filter fracture or migration which can lead to emergency surgery or even death, strut penetration with damage or irritation to adjacent structures and caval thrombosis.  All of the patient's questions were answered, patient is agreeable to proceed. Consent signed and in chart.   Thank you for this interesting consult.  I greatly enjoyed meeting Ricky Meyers and look forward to participating in their care.  A copy of this report was sent to the requesting provider on this date.  Electronically Signed: Tera Mater, PA-C 11/15/2021, 10:18 AM   I spent a total of    25 Minutes in face to face in clinical consultation, greater than 50% of which was counseling/coordinating care for IVC filter placement.   This chart was dictated using voice recognition software.  Despite best efforts to proofread,  errors can occur which can change the documentation meaning.

## 2021-11-15 NOTE — Procedures (Signed)
Interventional Radiology Procedure Note  Procedure: Placement of retrievable IVC filter  Complications: None  Estimated Blood Loss: None  Recommendations: - Bedrest with HOB elevated x 2 hrs - DC home  Signed,  Criselda Peaches, MD

## 2021-11-16 NOTE — Telephone Encounter (Signed)
No further cardiac testing needed before surgery.  Anticoag per pharmacy.  Needs f/u in valve clinic.

## 2021-11-16 NOTE — Pre-Procedure Instructions (Signed)
Surgical Instructions    Your procedure is scheduled on Wednesday, November 21, 2021 at 8:30 AM.  Report to [facility] Main Entrance "A" at 6:30 A.M., then check in with the Admitting office.  Call this number if you have problems the morning of surgery:  [phone]   If you have any questions prior to your surgery date call [phone]: Open Monday-Friday 8am-4pm    Remember:  Do not eat or drink after midnight the night before your surgery    Take these medicines the morning of surgery with A SIP OF WATER:  allopurinol (ZYLOPRIM) atorvastatin (LIPITOR) gabapentin (NEURONTIN) 20 MG tablet  IF NEEDED: cyclobenzaprine (FLEXERIL) oxyCODONE-acetaminophen (PERCOCET)   Follow your surgeon's instructions on when to stop apixaban (ELIQUIS).  If no instructions were given by your surgeon then you will need to call the office to get those instructions.     As of today, STOP taking any Aspirin (unless otherwise instructed by your surgeon) Aleve, Naproxen, Ibuprofen, Motrin, Advil, Goody's, BC's, all herbal medications, fish oil, and all vitamins.                     Do NOT Smoke (Tobacco/Vaping) or drink Alcohol 24 hours prior to your procedure.  If you use a CPAP at night, you may bring all equipment for your overnight stay.   Contacts, glasses, piercing's, hearing aid's, dentures or partials may not be worn into surgery, please bring cases for these belongings.    For patients admitted to the hospital, discharge time will be determined by your treatment team.   Patients discharged the day of surgery will not be allowed to drive home, and someone needs to stay with them for 24 hours.  NO VISITORS WILL BE ALLOWED IN PRE-OP WHERE PATIENTS GET READY FOR SURGERY.  ONLY 1 SUPPORT PERSON MAY BE PRESENT IN THE WAITING ROOM WHILE YOU ARE IN SURGERY.  IF YOU ARE TO BE ADMITTED, ONCE YOU ARE IN YOUR ROOM YOU WILL BE ALLOWED TWO (2) VISITORS.  Minor children may have two parents present.  Special consideration for safety and communication needs will be reviewed on a case by case basis.   Special instructions:   [place]- Preparing For Surgery  Before surgery, you can play an important role. Because skin is not sterile, your skin needs to be as free of germs as possible. You can reduce the number of germs on your skin by washing with CHG (chlorahexidine gluconate) Soap before surgery.  CHG is an antiseptic cleaner which kills germs and bonds with the skin to continue killing germs even after washing.    Oral Hygiene is also important to reduce your risk of infection.  Remember - BRUSH YOUR TEETH THE MORNING OF SURGERY WITH YOUR REGULAR TOOTHPASTE  Please do not use if you have an allergy to CHG or antibacterial soaps. If your skin becomes reddened/irritated stop using the CHG.  Do not shave (including legs and underarms) for at least 48 hours prior to first CHG shower. It is OK to shave your face.  Please follow these instructions carefully.   Shower the NIGHT BEFORE SURGERY and the MORNING OF SURGERY  If you chose to wash your hair, wash your hair first as usual with your normal shampoo.  After you shampoo, rinse your hair and body thoroughly to remove the shampoo.  Use CHG Soap as you would any other liquid soap. You can apply CHG directly to the skin and wash gently with a scrungie or  a clean washcloth.   Apply the CHG Soap to your body ONLY FROM THE NECK DOWN.  Do not use on open wounds or open sores. Avoid contact with your eyes, ears, mouth and genitals (private parts). Wash Face and genitals (private parts)  with your normal soap.   Wash thoroughly, paying special attention to the area where your surgery will be performed.  Thoroughly rinse your body with warm water from the neck down.  DO NOT shower/wash with your normal soap after using and rinsing off the CHG Soap.  Pat yourself dry with a CLEAN TOWEL.  Wear CLEAN PAJAMAS to bed the night before  surgery  Place CLEAN SHEETS on your bed the night before your surgery  DO NOT SLEEP WITH PETS.   Day of Surgery: Shower with CHG soap. Do not wear jewelry. Do not wear lotions, powders, colognes, or deodorant. Do not shave 48 hours prior to surgery.  Men may shave face and neck. Do not bring valuables to the hospital. [facility] is not responsible for any belongings or valuables. Wear Clean/Comfortable clothing the morning of surgery Remember to brush your teeth WITH YOUR REGULAR TOOTHPASTE.   Please read over the following fact sheets that you were given.   3 days prior to your procedure or After your COVID test   You are not required to quarantine however you are required to wear a well-fitting mask when you are out and around people not in your household. If your mask becomes wet or soiled, replace with a new one.   Wash your hands often with soap and water for 20 seconds or clean your hands with an alcohol-based hand sanitizer that contains at least 60% alcohol.   Do not share personal items.   Notify your provider:  o if you are in close contact with someone who has COVID  o or if you develop a fever of 100.4 or greater, sneezing, cough, sore throat, shortness of breath or body aches.

## 2021-11-19 ENCOUNTER — Encounter (HOSPITAL_COMMUNITY): Payer: Self-pay

## 2021-11-19 ENCOUNTER — Encounter (HOSPITAL_COMMUNITY)
Admission: RE | Admit: 2021-11-19 | Discharge: 2021-11-19 | Disposition: A | Discharge status: Home / Self Care | Payer: Medicare Other | Source: Ambulatory Visit | Attending: Neurological Surgery | Admitting: Neurological Surgery

## 2021-11-19 ENCOUNTER — Other Ambulatory Visit: Payer: Self-pay

## 2021-11-19 VITALS — BP 130/77 | HR 72 | Temp 97.8°F | Resp 17 | Ht 72.0 in | Wt 237.1 lb

## 2021-11-19 DIAGNOSIS — Z01812 Encounter for preprocedural laboratory examination: Secondary | ICD-10-CM | POA: Diagnosis not present

## 2021-11-19 DIAGNOSIS — Z7901 Long term (current) use of anticoagulants: Secondary | ICD-10-CM | POA: Insufficient documentation

## 2021-11-19 DIAGNOSIS — Z01818 Encounter for other preprocedural examination: Secondary | ICD-10-CM

## 2021-11-19 DIAGNOSIS — Z9989 Dependence on other enabling machines and devices: Secondary | ICD-10-CM | POA: Insufficient documentation

## 2021-11-19 DIAGNOSIS — R0600 Dyspnea, unspecified: Secondary | ICD-10-CM | POA: Insufficient documentation

## 2021-11-19 DIAGNOSIS — Z86718 Personal history of other venous thrombosis and embolism: Secondary | ICD-10-CM | POA: Insufficient documentation

## 2021-11-19 DIAGNOSIS — I447 Left bundle-branch block, unspecified: Secondary | ICD-10-CM | POA: Diagnosis not present

## 2021-11-19 DIAGNOSIS — G4733 Obstructive sleep apnea (adult) (pediatric): Secondary | ICD-10-CM | POA: Insufficient documentation

## 2021-11-19 DIAGNOSIS — I340 Nonrheumatic mitral (valve) insufficiency: Secondary | ICD-10-CM | POA: Insufficient documentation

## 2021-11-19 DIAGNOSIS — Z86711 Personal history of pulmonary embolism: Secondary | ICD-10-CM | POA: Diagnosis not present

## 2021-11-19 DIAGNOSIS — E785 Hyperlipidemia, unspecified: Secondary | ICD-10-CM | POA: Insufficient documentation

## 2021-11-19 DIAGNOSIS — I10 Essential (primary) hypertension: Secondary | ICD-10-CM | POA: Diagnosis not present

## 2021-11-19 LAB — SURGICAL PCR SCREEN
MRSA, PCR: NEGATIVE
Staphylococcus aureus: POSITIVE — AB

## 2021-11-19 LAB — PROTIME-INR
INR: 0.8 (ref 0.8–1.2)
Prothrombin Time: 11.5 seconds (ref 11.4–15.2)

## 2021-11-19 NOTE — Telephone Encounter (Signed)
° °  Primary Cardiologist: Larae Grooms, MD  Chart reviewed as part of pre-operative protocol coverage. Given past medical history and time since last visit, based on ACC/AHA guidelines, GRECO GASTELUM would be at acceptable risk for the planned procedure without further cardiovascular testing.   Patient with diagnosis of PE on Xarelto for anticoagulation.     Procedure: L3-4 Lumbar Laminectomy Date of procedure: 11/21/21     CrCl 76 mL/min Platelet count 178K   Per office protocol, patient can hold Xarelto  for 3 days prior to procedure.  I will route this recommendation to the requesting party via Epic fax function and remove from pre-op pool.  Please call with questions.  Jossie Ng. Jhane Lorio NP-C    11/19/2021, 8:30 AM [address] Office [phone] Fax [phone]

## 2021-11-19 NOTE — Progress Notes (Signed)
PCP - Dr. Alysia Penna Cardiologist - Dr. Larae Grooms  PPM/ICD - denies   Chest x-ray - 03/30/20 EKG - 11/08/21 Stress Test - 11/13/21 ECHO - 11/13/21 Cardiac Cath - denies  Sleep Study - 08/24/17, OSA+ CPAP - wears sometimes  DM- denies  Blood Thinner Instructions: hold Eliquis for 7 days. Pt took last dose on 11/14/21. Aspirin Instructions: n/a  ERAS Protcol - no, NPO   COVID TEST- n/a, pt had positive COVID home test on 10/30/21.   Anesthesia review: yes, cardiac hx  Patient denies shortness of breath, fever, cough and chest pain at PAT appointment   All instructions explained to the patient, with a verbal understanding of the material. Patient agrees to go over the instructions while at home for a better understanding. Patient also instructed to wear a mask in public for 3 days prior to surgery. The opportunity to ask questions was provided.

## 2021-11-20 NOTE — Anesthesia Preprocedure Evaluation (Addendum)
Anesthesia Evaluation  Patient identified by MRN, date of birth, ID band Patient awake    Reviewed: Allergy & Precautions, NPO status , Patient's Chart, lab work & pertinent test results  Airway Mallampati: III  TM Distance: >3 FB Neck ROM: Full    Dental  (+) Dental Advisory Given   Pulmonary sleep apnea ,    breath sounds clear to auscultation       Cardiovascular hypertension, Pt. on medications + DOE and + DVT  + dysrhythmias + Valvular Problems/Murmurs (Severe MR with prolapse) MR  Rhythm:Regular Rate:Normal     Neuro/Psych negative neurological ROS     GI/Hepatic Neg liver ROS, GERD  ,  Endo/Other  negative endocrine ROS  Renal/GU negative Renal ROS     Musculoskeletal  (+) Arthritis ,   Abdominal   Peds  Hematology negative hematology ROS (+)   Anesthesia Other Findings   Reproductive/Obstetrics                           Lab Results  Component Value Date   WBC 8.2 11/15/2021   HGB 13.7 11/15/2021   HCT 40.4 11/15/2021   MCV 93.1 11/15/2021   PLT 269 11/15/2021   Lab Results  Component Value Date   CREATININE 1.03 11/15/2021   BUN 15 11/15/2021   NA 140 11/15/2021   K 4.1 11/15/2021   CL 102 11/15/2021   CO2 26 11/15/2021    Anesthesia Physical Anesthesia Plan  ASA: 3  Anesthesia Plan: General   Post-op Pain Management: Minimal or no pain anticipated, Tylenol PO (pre-op) and Gabapentin PO (pre-op)   Induction: Intravenous  PONV Risk Score and Plan: 2 and Dexamethasone, Ondansetron and Treatment may vary due to age or medical condition  Airway Management Planned: Oral ETT  Additional Equipment: None  Intra-op Plan:   Post-operative Plan: Extubation in OR  Informed Consent: I have reviewed the patients History and Physical, chart, labs and discussed the procedure including the risks, benefits and alternatives for the proposed anesthesia with the patient or  authorized representative who has indicated his/her understanding and acceptance.     Dental advisory given  Plan Discussed with: CRNA  Anesthesia Plan Comments: (PAT note by Karoline Caldwell, PA-C: Follows with cardiology for history of HTN, HLD, DVT/PE August 2020, severe MR, left bundle branch block.  Recently underwent nuclear stress and echocardiogram January 2023 test for evaluation of DOE.  Stress test was nonischemic, low risk.  Echo showed normal LVEF, severe mitral regurgitation.  Dr. Irish Lack commented on result stating, "Normal LV function. Severe mitral regurgitation. Can proceed with back surgery. Will see if mitraclip would be an option for him. If his SHOB persists, will need TEE.".  Cardiac clearance also given per telephone encounter 11/19/2021, "Chart reviewed as part of pre-operative protocol coverage. Given past medical history and time since last visit, based on ACC/AHA guidelines,Ladarrius P Ogburnwould be at acceptable risk for the planned procedure without further cardiovascular testing.Patient with diagnosis ofPEon Xareltofor anticoagulation.Marland KitchenMarland KitchenPer office protocol, patient can holdXareltofor 3days prior to procedure."  Patient had IVC filter placed 11/15/2021 by interventional radiology in preparation for anticoagulation interruption for lumbar surgery.  Patient reports last dose Eliquis 11/14/2021.  OSA on CPAP with variable compliance.  Preop labs reviewed, unremarkable.  EKG 11/08/2021: NSR.  Rate 98.  Left bundle branch block.  TTE 11/13/2021: 1. Severe mitral regurgitation.  2. Left ventricular ejection fraction, by estimation, is 55 to 60%. The  left ventricle  has normal function. The left ventricle has no regional  wall motion abnormalities. Left ventricular diastolic parameters are  consistent with Grade I diastolic  dysfunction (impaired relaxation).  3. Right ventricular systolic function is normal. The right ventricular  size is normal.  4. Left  atrial size was mildly dilated.  5. PISA radius: 1.4 cm   MR ERO: 0.73 cm2   MR Volume: 123 ml. The mitral valve is normal in structure. Severe  mitral valve regurgitation. No evidence of mitral stenosis. There is  moderate late systolic prolapse of the middle scallop of the posterior  leaflet of the mitral valve.  6. The aortic valve is normal in structure. Aortic valve regurgitation is  not visualized. No aortic stenosis is present.  7. The inferior vena cava is normal in size with greater than 50%  respiratory variability, suggesting right atrial pressure of 3 mmHg.   Comparison(s): Prior images reviewed side by side. 04/20/20 EF 55-60%.  Moderate MR. Has progressed from prior.   Nuclear stress 11/13/2021:  EKG shows no changes during Lexiscan stress  Myoview scan with probable normal perfusion and mild soft tissue attenuation (diaphragm) No ischemia or scar  LVEF 59%.  Low risk study  No significant change from study from 2018. )      Anesthesia Quick Evaluation

## 2021-11-20 NOTE — Progress Notes (Signed)
Anesthesia Chart Review:  Follows with cardiology for history of HTN, HLD, DVT/PE August 2020, severe MR, left bundle branch block.  Recently underwent nuclear stress and echocardiogram January 2023 test for evaluation of DOE.  Stress test was nonischemic, low risk.  Echo showed normal LVEF, severe mitral regurgitation.  Dr. Irish Lack commented on result stating, "Normal LV function.  Severe mitral regurgitation.  Can proceed with back surgery.  Will see if mitraclip would be an option for him.  If his SHOB persists, will need TEE.".  Cardiac clearance also given per telephone encounter 11/19/2021, "Chart reviewed as part of pre-operative protocol coverage. Given past medical history and time since last visit, based on ACC/AHA guidelines, Ricky Meyers would be at acceptable risk for the planned procedure without further cardiovascular testing. Patient with diagnosis of PE on Xarelto for anticoagulation... Per office protocol, patient can hold Xarelto  for 3 days prior to procedure."  Patient had IVC filter placed 11/15/2021 by interventional radiology in preparation for anticoagulation interruption for lumbar surgery.  Patient reports last dose Eliquis 11/14/2021.  OSA on CPAP with variable compliance.  Preop labs reviewed, unremarkable.  EKG 11/08/2021: NSR.  Rate 98.  Left bundle branch block.  TTE 11/13/2021:  1. Severe mitral regurgitation.   2. Left ventricular ejection fraction, by estimation, is 55 to 60%. The  left ventricle has normal function. The left ventricle has no regional  wall motion abnormalities. Left ventricular diastolic parameters are  consistent with Grade I diastolic  dysfunction (impaired relaxation).   3. Right ventricular systolic function is normal. The right ventricular  size is normal.   4. Left atrial size was mildly dilated.   5. PISA radius: 1.4 cm      MR ERO: 0.73 cm2      MR Volume: 123 ml. The mitral valve is normal in structure. Severe  mitral valve  regurgitation. No evidence of mitral stenosis. There is  moderate late systolic prolapse of the middle scallop of the posterior  leaflet of the mitral valve.   6. The aortic valve is normal in structure. Aortic valve regurgitation is  not visualized. No aortic stenosis is present.   7. The inferior vena cava is normal in size with greater than 50%  respiratory variability, suggesting right atrial pressure of 3 mmHg.   Comparison(s): Prior images reviewed side by side. 04/20/20 EF 55-60%.  Moderate MR. Has progressed from prior.   Nuclear stress 11/13/2021:   EKG shows no changes during Lexiscan stress   Myoview scan with probable normal perfusion and mild soft tissue attenuation (diaphragm)  No ischemia or scar   LVEF 59%.   Low risk study   No significant change from study from 2018.    Ricky Meyers [facility]/Anesthesiology Phone [phone] 11/20/2021 12:13 PM

## 2021-11-21 ENCOUNTER — Ambulatory Visit (HOSPITAL_COMMUNITY): Payer: Medicare Other | Admitting: Anesthesiology

## 2021-11-21 ENCOUNTER — Encounter (HOSPITAL_COMMUNITY)
Admission: RE | Disposition: A | Discharge status: Home / Self Care | Payer: Self-pay | Source: Home / Self Care | Attending: Neurological Surgery

## 2021-11-21 ENCOUNTER — Telehealth: Payer: Self-pay

## 2021-11-21 ENCOUNTER — Ambulatory Visit (HOSPITAL_COMMUNITY): Payer: Medicare Other | Admitting: Physician Assistant

## 2021-11-21 ENCOUNTER — Encounter (HOSPITAL_COMMUNITY): Payer: Self-pay | Admitting: Neurological Surgery

## 2021-11-21 ENCOUNTER — Other Ambulatory Visit: Payer: Self-pay

## 2021-11-21 ENCOUNTER — Ambulatory Visit (HOSPITAL_COMMUNITY): Payer: Medicare Other

## 2021-11-21 ENCOUNTER — Ambulatory Visit (HOSPITAL_COMMUNITY)
Admission: RE | Admit: 2021-11-21 | Discharge: 2021-11-21 | Disposition: A | Discharge status: Home / Self Care | Payer: Medicare Other | Attending: Neurological Surgery | Admitting: Neurological Surgery

## 2021-11-21 DIAGNOSIS — K219 Gastro-esophageal reflux disease without esophagitis: Secondary | ICD-10-CM | POA: Insufficient documentation

## 2021-11-21 DIAGNOSIS — M48061 Spinal stenosis, lumbar region without neurogenic claudication: Secondary | ICD-10-CM | POA: Insufficient documentation

## 2021-11-21 DIAGNOSIS — Z419 Encounter for procedure for purposes other than remedying health state, unspecified: Secondary | ICD-10-CM

## 2021-11-21 DIAGNOSIS — M5416 Radiculopathy, lumbar region: Secondary | ICD-10-CM | POA: Insufficient documentation

## 2021-11-21 DIAGNOSIS — M1990 Unspecified osteoarthritis, unspecified site: Secondary | ICD-10-CM | POA: Diagnosis not present

## 2021-11-21 DIAGNOSIS — M4317 Spondylolisthesis, lumbosacral region: Secondary | ICD-10-CM | POA: Diagnosis not present

## 2021-11-21 DIAGNOSIS — Z86718 Personal history of other venous thrombosis and embolism: Secondary | ICD-10-CM | POA: Insufficient documentation

## 2021-11-21 DIAGNOSIS — G4733 Obstructive sleep apnea (adult) (pediatric): Secondary | ICD-10-CM | POA: Diagnosis not present

## 2021-11-21 DIAGNOSIS — G4730 Sleep apnea, unspecified: Secondary | ICD-10-CM | POA: Diagnosis not present

## 2021-11-21 DIAGNOSIS — I340 Nonrheumatic mitral (valve) insufficiency: Secondary | ICD-10-CM | POA: Diagnosis not present

## 2021-11-21 DIAGNOSIS — I10 Essential (primary) hypertension: Secondary | ICD-10-CM | POA: Insufficient documentation

## 2021-11-21 DIAGNOSIS — Z9889 Other specified postprocedural states: Secondary | ICD-10-CM

## 2021-11-21 HISTORY — PX: LUMBAR LAMINECTOMY/DECOMPRESSION MICRODISCECTOMY: SHX5026

## 2021-11-21 SURGERY — LUMBAR LAMINECTOMY/DECOMPRESSION MICRODISCECTOMY 1 LEVEL
Anesthesia: General | Site: Back | Laterality: Left

## 2021-11-21 MED ORDER — THROMBIN 5000 UNITS EX SOLR
CUTANEOUS | Status: DC | PRN
  Administered 2021-11-21 (×2): 5000 [IU] via TOPICAL

## 2021-11-21 MED ORDER — BUPIVACAINE HCL 0.25 % IJ SOLN
INTRAMUSCULAR | Status: DC | PRN
  Administered 2021-11-21: 10 mL

## 2021-11-21 MED ORDER — ROCURONIUM BROMIDE 10 MG/ML (PF) SYRINGE
PREFILLED_SYRINGE | INTRAVENOUS | Status: AC
  Filled 2021-11-21: qty 10

## 2021-11-21 MED ORDER — SODIUM CHLORIDE 0.9% FLUSH: 3.0000 mL | INTRAVENOUS | Status: DC | PRN

## 2021-11-21 MED ORDER — GABAPENTIN 100 MG PO CAPS
100.0000 mg | ORAL_CAPSULE | Freq: Four times a day (QID) | ORAL | Status: DC
  Administered 2021-11-21: 100 mg via ORAL
  Filled 2021-11-21: qty 1

## 2021-11-21 MED ORDER — CHLORHEXIDINE GLUCONATE 0.12 % MT SOLN: 15.0000 mL | Freq: Once | OROMUCOSAL | Status: AC

## 2021-11-21 MED ORDER — ORAL CARE MOUTH RINSE: 15.0000 mL | Freq: Once | OROMUCOSAL | Status: AC

## 2021-11-21 MED ORDER — LIDOCAINE 2% (20 MG/ML) 5 ML SYRINGE
INTRAMUSCULAR | Status: AC
  Filled 2021-11-21: qty 5

## 2021-11-21 MED ORDER — POTASSIUM CHLORIDE IN NACL 20-0.9 MEQ/L-% IV SOLN: INTRAVENOUS | Status: DC

## 2021-11-21 MED ORDER — FENTANYL CITRATE (PF) 100 MCG/2ML IJ SOLN
INTRAMUSCULAR | Status: AC
  Filled 2021-11-21: qty 2

## 2021-11-21 MED ORDER — PAROXETINE HCL 20 MG PO TABS: 20.0000 mg | ORAL_TABLET | Freq: Every morning | ORAL | Status: DC

## 2021-11-21 MED ORDER — PHENYLEPHRINE HCL-NACL 20-0.9 MG/250ML-% IV SOLN
INTRAVENOUS | Status: DC | PRN
  Administered 2021-11-21: 25 ug/min via INTRAVENOUS

## 2021-11-21 MED ORDER — MENTHOL 3 MG MT LOZG: 1.0000 | LOZENGE | OROMUCOSAL | Status: DC | PRN

## 2021-11-21 MED ORDER — SENNA 8.6 MG PO TABS: 1.0000 | ORAL_TABLET | Freq: Two times a day (BID) | ORAL | Status: DC

## 2021-11-21 MED ORDER — THROMBIN 5000 UNITS EX SOLR: OROMUCOSAL | Status: DC | PRN

## 2021-11-21 MED ORDER — ACETAMINOPHEN 650 MG RE SUPP: 650.0000 mg | RECTAL | Status: DC | PRN

## 2021-11-21 MED ORDER — SUGAMMADEX SODIUM 200 MG/2ML IV SOLN
INTRAVENOUS | Status: DC | PRN
  Administered 2021-11-21: 200 mg via INTRAVENOUS

## 2021-11-21 MED ORDER — DEXAMETHASONE SODIUM PHOSPHATE 10 MG/ML IJ SOLN
INTRAMUSCULAR | Status: DC | PRN
  Administered 2021-11-21: 10 mg via INTRAVENOUS

## 2021-11-21 MED ORDER — ONDANSETRON HCL 4 MG/2ML IJ SOLN
INTRAMUSCULAR | Status: DC | PRN
  Administered 2021-11-21: 4 mg via INTRAVENOUS

## 2021-11-21 MED ORDER — SODIUM CHLORIDE 0.9% FLUSH: 3.0000 mL | Freq: Two times a day (BID) | INTRAVENOUS | Status: DC

## 2021-11-21 MED ORDER — ACETAMINOPHEN 500 MG PO TABS
1000.0000 mg | ORAL_TABLET | ORAL | Status: AC
  Administered 2021-11-21: 1000 mg via ORAL
  Filled 2021-11-21: qty 2

## 2021-11-21 MED ORDER — METHOCARBAMOL 500 MG PO TABS: 500.0000 mg | ORAL_TABLET | Freq: Four times a day (QID) | ORAL | 0 refills | Status: DC

## 2021-11-21 MED ORDER — ONDANSETRON HCL 4 MG PO TABS: 4.0000 mg | ORAL_TABLET | Freq: Four times a day (QID) | ORAL | Status: DC | PRN

## 2021-11-21 MED ORDER — PROPOFOL 10 MG/ML IV BOLUS
INTRAVENOUS | Status: DC | PRN
  Administered 2021-11-21: 150 mg via INTRAVENOUS
  Administered 2021-11-21: 20 mg via INTRAVENOUS

## 2021-11-21 MED ORDER — CEFAZOLIN SODIUM-DEXTROSE 2-4 GM/100ML-% IV SOLN
2.0000 g | INTRAVENOUS | Status: AC
  Administered 2021-11-21: 2 g via INTRAVENOUS
  Filled 2021-11-21: qty 100

## 2021-11-21 MED ORDER — TELMISARTAN-HCTZ 80-25 MG PO TABS
1.0000 | ORAL_TABLET | Freq: Every day | ORAL | Status: DC
Start: 2021-11-21 — End: 2021-11-21

## 2021-11-21 MED ORDER — FENTANYL CITRATE (PF) 250 MCG/5ML IJ SOLN
INTRAMUSCULAR | Status: DC | PRN
  Administered 2021-11-21 (×5): 50 ug via INTRAVENOUS

## 2021-11-21 MED ORDER — THROMBIN 5000 UNITS EX SOLR
CUTANEOUS | Status: AC
  Filled 2021-11-21: qty 10000

## 2021-11-21 MED ORDER — 0.9 % SODIUM CHLORIDE (POUR BTL) OPTIME
TOPICAL | Status: DC | PRN
  Administered 2021-11-21: 1000 mL

## 2021-11-21 MED ORDER — PROPOFOL 10 MG/ML IV BOLUS
INTRAVENOUS | Status: AC
  Filled 2021-11-21: qty 20

## 2021-11-21 MED ORDER — FENTANYL CITRATE (PF) 100 MCG/2ML IJ SOLN
25.0000 ug | INTRAMUSCULAR | Status: DC | PRN
  Administered 2021-11-21: 50 ug via INTRAVENOUS

## 2021-11-21 MED ORDER — AMISULPRIDE (ANTIEMETIC) 5 MG/2ML IV SOLN: 10.0000 mg | Freq: Once | INTRAVENOUS | Status: DC | PRN

## 2021-11-21 MED ORDER — SODIUM CHLORIDE 0.9 % IV SOLN: 250.0000 mL | INTRAVENOUS | Status: DC

## 2021-11-21 MED ORDER — DEXAMETHASONE 4 MG PO TABS
4.0000 mg | ORAL_TABLET | Freq: Four times a day (QID) | ORAL | Status: DC
  Administered 2021-11-21: 4 mg via ORAL
  Filled 2021-11-21: qty 1

## 2021-11-21 MED ORDER — OXYCODONE-ACETAMINOPHEN 5-325 MG PO TABS
1.0000 | ORAL_TABLET | ORAL | Status: DC | PRN
Start: 2021-11-21 — End: 2021-11-21
  Administered 2021-11-21: 1 via ORAL
  Filled 2021-11-21: qty 1

## 2021-11-21 MED ORDER — IRBESARTAN 150 MG PO TABS: 300.0000 mg | ORAL_TABLET | Freq: Every day | ORAL | Status: DC

## 2021-11-21 MED ORDER — METHOCARBAMOL 1000 MG/10ML IJ SOLN
500.0000 mg | Freq: Four times a day (QID) | INTRAVENOUS | Status: DC | PRN
  Filled 2021-11-21: qty 5

## 2021-11-21 MED ORDER — DEXAMETHASONE SODIUM PHOSPHATE 4 MG/ML IJ SOLN: 4.0000 mg | Freq: Four times a day (QID) | INTRAMUSCULAR | Status: DC

## 2021-11-21 MED ORDER — CYCLOBENZAPRINE HCL 10 MG PO TABS: 10.0000 mg | ORAL_TABLET | Freq: Three times a day (TID) | ORAL | Status: DC | PRN

## 2021-11-21 MED ORDER — ROCURONIUM BROMIDE 10 MG/ML (PF) SYRINGE
PREFILLED_SYRINGE | INTRAVENOUS | Status: DC | PRN
  Administered 2021-11-21: 70 mg via INTRAVENOUS
  Administered 2021-11-21: 30 mg via INTRAVENOUS

## 2021-11-21 MED ORDER — CHLORHEXIDINE GLUCONATE CLOTH 2 % EX PADS: 6.0000 | MEDICATED_PAD | Freq: Once | CUTANEOUS | Status: DC

## 2021-11-21 MED ORDER — DEXAMETHASONE SODIUM PHOSPHATE 10 MG/ML IJ SOLN
INTRAMUSCULAR | Status: AC
  Filled 2021-11-21: qty 1

## 2021-11-21 MED ORDER — FENTANYL CITRATE (PF) 250 MCG/5ML IJ SOLN
INTRAMUSCULAR | Status: AC
  Filled 2021-11-21: qty 5

## 2021-11-21 MED ORDER — BUPIVACAINE HCL (PF) 0.25 % IJ SOLN
INTRAMUSCULAR | Status: DC | PRN
  Administered 2021-11-21: 7 mL

## 2021-11-21 MED ORDER — CEFAZOLIN SODIUM-DEXTROSE 2-4 GM/100ML-% IV SOLN: 2.0000 g | Freq: Three times a day (TID) | INTRAVENOUS | Status: DC

## 2021-11-21 MED ORDER — ACETAMINOPHEN 325 MG PO TABS: 650.0000 mg | ORAL_TABLET | ORAL | Status: DC | PRN

## 2021-11-21 MED ORDER — LIDOCAINE 2% (20 MG/ML) 5 ML SYRINGE
INTRAMUSCULAR | Status: DC | PRN
  Administered 2021-11-21: 60 mg via INTRAVENOUS

## 2021-11-21 MED ORDER — ONDANSETRON HCL 4 MG/2ML IJ SOLN
INTRAMUSCULAR | Status: AC
  Filled 2021-11-21: qty 2

## 2021-11-21 MED ORDER — OXYCODONE-ACETAMINOPHEN 10-325 MG PO TABS: 1.0000 | ORAL_TABLET | ORAL | Status: DC | PRN

## 2021-11-21 MED ORDER — METHOCARBAMOL 500 MG PO TABS: 500.0000 mg | ORAL_TABLET | Freq: Four times a day (QID) | ORAL | Status: DC | PRN

## 2021-11-21 MED ORDER — GABAPENTIN 300 MG PO CAPS
300.0000 mg | ORAL_CAPSULE | ORAL | Status: AC
  Administered 2021-11-21: 300 mg via ORAL
  Filled 2021-11-21: qty 1

## 2021-11-21 MED ORDER — BUPIVACAINE HCL (PF) 0.25 % IJ SOLN
INTRAMUSCULAR | Status: AC
  Filled 2021-11-21: qty 30

## 2021-11-21 MED ORDER — OXYCODONE HCL 5 MG PO TABS
5.0000 mg | ORAL_TABLET | ORAL | Status: DC | PRN
  Administered 2021-11-21: 5 mg via ORAL
  Filled 2021-11-21: qty 1

## 2021-11-21 MED ORDER — HEMOSTATIC AGENTS (NO CHARGE) OPTIME
TOPICAL | Status: DC | PRN
  Administered 2021-11-21: 1 via TOPICAL

## 2021-11-21 MED ORDER — THROMBIN 5000 UNITS EX SOLR
CUTANEOUS | Status: AC
  Filled 2021-11-21: qty 5000

## 2021-11-21 MED ORDER — PHENOL 1.4 % MT LIQD: 1.0000 | OROMUCOSAL | Status: DC | PRN

## 2021-11-21 MED ORDER — MORPHINE SULFATE (PF) 2 MG/ML IV SOLN
2.0000 mg | INTRAVENOUS | Status: DC | PRN
  Administered 2021-11-21: 2 mg via INTRAVENOUS
  Filled 2021-11-21: qty 1

## 2021-11-21 MED ORDER — CHLORHEXIDINE GLUCONATE 0.12 % MT SOLN
OROMUCOSAL | Status: AC
  Administered 2021-11-21: 15 mL via OROMUCOSAL
  Filled 2021-11-21: qty 15

## 2021-11-21 MED ORDER — ONDANSETRON HCL 4 MG/2ML IJ SOLN: 4.0000 mg | Freq: Four times a day (QID) | INTRAMUSCULAR | Status: DC | PRN

## 2021-11-21 MED ORDER — HYDROCHLOROTHIAZIDE 25 MG PO TABS: 25.0000 mg | ORAL_TABLET | Freq: Every day | ORAL | Status: DC

## 2021-11-21 MED ORDER — LACTATED RINGERS IV SOLN: INTRAVENOUS | Status: DC

## 2021-11-21 MED ORDER — PHENYLEPHRINE 40 MCG/ML (10ML) SYRINGE FOR IV PUSH (FOR BLOOD PRESSURE SUPPORT)
PREFILLED_SYRINGE | INTRAVENOUS | Status: DC | PRN
  Administered 2021-11-21: 40 ug via INTRAVENOUS
  Administered 2021-11-21: 80 ug via INTRAVENOUS

## 2021-11-21 MED ORDER — OXYCODONE-ACETAMINOPHEN 5-325 MG PO TABS: 1.0000 | ORAL_TABLET | ORAL | 0 refills | Status: DC | PRN

## 2021-11-21 MED ORDER — LACTATED RINGERS IV SOLN: INTRAVENOUS | Status: DC | PRN

## 2021-11-21 SURGICAL SUPPLY — 48 items
ADH SKN CLS APL DERMABOND .7 (GAUZE/BANDAGES/DRESSINGS) ×1
APL SKNCLS STERI-STRIP NONHPOA (GAUZE/BANDAGES/DRESSINGS) ×1
BAG COUNTER SPONGE SURGICOUNT (BAG) ×2 IMPLANT
BAG SPNG CNTER NS LX DISP (BAG) ×1
BAND INSRT 18 STRL LF DISP RB (MISCELLANEOUS) ×2
BAND RUBBER #18 3X1/16 STRL (MISCELLANEOUS) ×4 IMPLANT
BENZOIN TINCTURE PRP APPL 2/3 (GAUZE/BANDAGES/DRESSINGS) ×2 IMPLANT
BUR CARBIDE MATCH 3.0 (BURR) ×2 IMPLANT
CANISTER SUCT 3000ML PPV (MISCELLANEOUS) ×2 IMPLANT
DERMABOND ADVANCED (GAUZE/BANDAGES/DRESSINGS) ×1
DERMABOND ADVANCED .7 DNX12 (GAUZE/BANDAGES/DRESSINGS) IMPLANT
DRAPE HALF SHEET 40X57 (DRAPES) ×1 IMPLANT
DRAPE LAPAROTOMY 100X72X124 (DRAPES) ×2 IMPLANT
DRAPE MICROSCOPE LEICA (MISCELLANEOUS) ×2 IMPLANT
DRAPE SURG 17X23 STRL (DRAPES) ×1 IMPLANT
DRSG OPSITE POSTOP 3X4 (GAUZE/BANDAGES/DRESSINGS) ×1 IMPLANT
DURAPREP 26ML APPLICATOR (WOUND CARE) ×2 IMPLANT
ELECT REM PT RETURN 9FT ADLT (ELECTROSURGICAL) ×2
ELECTRODE REM PT RTRN 9FT ADLT (ELECTROSURGICAL) ×1 IMPLANT
GAUZE 4X4 16PLY ~~LOC~~+RFID DBL (SPONGE) ×1 IMPLANT
GLOVE SURG ENC MOIS LTX SZ7 (GLOVE) ×1 IMPLANT
GLOVE SURG ENC MOIS LTX SZ8 (GLOVE) ×2 IMPLANT
GLOVE SURG UNDER POLY LF SZ7 (GLOVE) ×1 IMPLANT
GOWN STRL REUS W/ TWL LRG LVL3 (GOWN DISPOSABLE) IMPLANT
GOWN STRL REUS W/ TWL XL LVL3 (GOWN DISPOSABLE) ×1 IMPLANT
GOWN STRL REUS W/TWL 2XL LVL3 (GOWN DISPOSABLE) IMPLANT
GOWN STRL REUS W/TWL LRG LVL3 (GOWN DISPOSABLE) ×4
GOWN STRL REUS W/TWL XL LVL3 (GOWN DISPOSABLE) ×2
HEMOSTAT POWDER KIT SURGIFOAM (HEMOSTASIS) ×2 IMPLANT
KIT BASIN OR (CUSTOM PROCEDURE TRAY) ×2 IMPLANT
KIT TURNOVER KIT B (KITS) ×2 IMPLANT
NDL HYPO 25X1 1.5 SAFETY (NEEDLE) ×1 IMPLANT
NDL SPNL 20GX3.5 QUINCKE YW (NEEDLE) IMPLANT
NEEDLE HYPO 25X1 1.5 SAFETY (NEEDLE) ×2 IMPLANT
NEEDLE SPNL 20GX3.5 QUINCKE YW (NEEDLE) ×2 IMPLANT
NS IRRIG 1000ML POUR BTL (IV SOLUTION) ×2 IMPLANT
PACK LAMINECTOMY NEURO (CUSTOM PROCEDURE TRAY) ×2 IMPLANT
PAD ARMBOARD 7.5X6 YLW CONV (MISCELLANEOUS) ×6 IMPLANT
SPONGE SURGIFOAM ABS GEL SZ50 (HEMOSTASIS) ×1 IMPLANT
SPONGE T-LAP 4X18 ~~LOC~~+RFID (SPONGE) ×1 IMPLANT
STRIP CLOSURE SKIN 1/2X4 (GAUZE/BANDAGES/DRESSINGS) ×2 IMPLANT
SUT VIC AB 0 CT1 18XCR BRD8 (SUTURE) ×1 IMPLANT
SUT VIC AB 0 CT1 8-18 (SUTURE) ×2
SUT VIC AB 2-0 CP2 18 (SUTURE) ×2 IMPLANT
SUT VIC AB 3-0 SH 8-18 (SUTURE) ×2 IMPLANT
TOWEL GREEN STERILE (TOWEL DISPOSABLE) ×2 IMPLANT
TOWEL GREEN STERILE FF (TOWEL DISPOSABLE) ×2 IMPLANT
WATER STERILE IRR 1000ML POUR (IV SOLUTION) ×2 IMPLANT

## 2021-11-21 NOTE — Discharge Summary (Signed)
Physician Discharge Summary  Patient ID: Ricky Meyers MRN: [id number] DOB/AGE: [birth year] 76 y.o.  Admit date: 11/21/2021 Discharge date: 11/21/2021  Admission Diagnoses: Lumbar spinal stenosis L3-4 left with left lower extremity radiculopathy     Discharge Diagnoses: same   Discharged Condition: good  Hospital Course: The patient was admitted on 11/21/2021 and taken to the operating room where the patient underwent Decompressive lumbar hemilaminectomy medial facetectomy and foraminotomies L3-4 on the left utilizing microscopic dissection  . The patient tolerated the procedure well and was taken to the recovery room and then to the same in stable condition. The hospital course was routine. There were no complications. The wound remained clean dry and intact. Pt had appropriate back soreness. No complaints of leg pain or new N/T/W. The patient remained afebrile with stable vital signs, and tolerated a regular diet. The patient continued to increase activities, and pain was well controlled with oral pain medications.   Consults: None  Significant Diagnostic Studies:  Results for orders placed or performed during the hospital encounter of 11/19/21  Surgical pcr screen   Specimen: Nasal Mucosa; Nasal Swab  Result Value Ref Range   MRSA, PCR NEGATIVE NEGATIVE   Staphylococcus aureus POSITIVE (A) NEGATIVE  Protime-INR  Result Value Ref Range   Prothrombin Time 11.5 11.4 - 15.2 seconds   INR 0.8 0.8 - 1.2    DG Lumbar Spine 2-3 Views  Result Date: 11/21/2021 CLINICAL DATA:  76 year old male undergoing lumbar surgery. EXAM: LUMBAR SPINE - 2-3 VIEW COMPARISON:  Lumbar radiographs 07/19/2021. FINDINGS: Normal lumbar segmentation demonstrated last year. Chronic anterolisthesis of L5 on S1. Intraoperative portable cross-table lateral views of the lumbar spine. Image #1 at 0908 hours. Posterior needle directed at the L4 vertebral body. Image #2 at 0927 hours. Surgical probe directed toward the  L3-L4 facets. IVC filter also now in place. IMPRESSION: 1. Intraoperative localization ultimately at L3-L4. 2. Chronic L5-S1 spondylolisthesis. 3. IVC filter. Electronically Signed   By: Genevie Ann M.D.   On: 11/21/2021 11:27   IR IVC FILTER PLMT / S&I Burke Keels GUID/MOD SED  Result Date: 11/15/2021 INDICATION: 76 year old male with a history of deep venous thrombosis on long-term Eliquis. He requires lower back surgery and cessation of anticoagulation for a finite. Of time and presents for placement of a potentially retrievable IVC filter. EXAM: ULTRASOUND GUIDANCE FOR VASCULARACCESS IVC CATHETERIZATION AND VENOGRAM IVC FILTER INSERTION Interventional Radiologist:  Criselda Peaches, MD MEDICATIONS: None. ANESTHESIA/SEDATION: Fentanyl 50 mcg IV; Versed 1 mg IV administered intravenously by radiology nursing Moderate Sedation Time:  8 minutes [name] vital signs and level of consciousness was continuously monitored during the procedure by the interventional radiology nurse under my direct supervision. FLUOROSCOPY TIME:  Fluoroscopy Time: 0 minutes 24 seconds (86 mGy). COMPLICATIONS: None immediate. PROCEDURE: Informed written consent was obtained from the patient after a thorough discussion of the procedural risks, benefits and alternatives. All questions were addressed. Maximal Sterile Barrier Technique was utilized including caps, mask, sterile gowns, sterile gloves, sterile drape, hand hygiene and skin antiseptic. A timeout was performed prior to the initiation of the procedure. Maximal barrier sterile technique utilized including caps, mask, sterile gowns, sterile gloves, large sterile drape, hand hygiene, and Betadine prep. Under sterile condition and local anesthesia, right internal jugular venous access was performed with ultrasound. An ultrasound image was saved and sent to PACS. Over a guidewire, the IVC filter delivery sheath and inner dilator were advanced into the IVC just above the IVC bifurcation.  Contrast injection was  performed for an IVC venogram. Through the delivery sheath, a retrievable Denali IVC filter was deployed below the level of the renal veins and above the IVC bifurcation. Limited post deployment venacavagram was performed. The delivery sheath was removed and hemostasis was obtained with manual compression. A dressing was placed. The patient tolerated the procedure well without immediate post procedural complication. FINDINGS: The IVC is patent. No evidence of thrombus, stenosis, or occlusion. No variant venous anatomy. Successful placement of the IVC filter below the level of the renal veins. IMPRESSION: Successful ultrasound and fluoroscopically guided placement of an infrarenal retrievable IVC filter via right jugular approach. PLAN: This IVC filter is potentially retrievable. The patient will be assessed for filter retrieval by Interventional Radiology in approximately 8-12 weeks. Further recommendations regarding filter retrieval, continued surveillance or declaration of device permanence, will be made at that time. Electronically Signed   By: Jacqulynn Cadet M.D.   On: 11/15/2021 11:29   MYOCARDIAL PERFUSION IMAGING  Result Date: 11/13/2021   EKG shows no changes during Lexiscan stress   Myoview scan with probable normal perfusion and mild soft tissue attenuation (diaphragm)  No ischemia or scar   LVEF 59%.   Low risk study   No significant change from study from 2018.   ECHOCARDIOGRAM COMPLETE  Result Date: 11/13/2021    ECHOCARDIOGRAM REPORT   Patient Name:   Ricky Meyers Date of Exam: 11/13/2021 Medical Rec #:  [id number]       Height:       72.0 in Accession #:    [id number]      Weight:       237.0 lb Date of Birth:  [birth year]       BSA:          2.290 m Patient Age:    76 years        BP:           144/79 mmHg Patient Gender: M               HR:           68 bpm. Exam Location:  [address] Procedure: 2D Echo, 3D Echo, Cardiac Doppler, Color Doppler and Strain Analysis  Indications:    I05.9 Mitral valve disease  History:        Patient has prior history of Echocardiogram examinations, most                 recent 04/20/2020. Arrythmias:LBBB, Signs/Symptoms:Shortness of                 Breath; Risk Factors:Hypertension, Sleep Apnea and Dyslipidemia.                 Pulmonary embolus. H/o prostate cancer. Anemia. COVID-19.  Sonographer:    Basilia Jumbo BS, RDCS Referring Phys: [place]  1. Severe mitral regurgitation.  2. Left ventricular ejection fraction, by estimation, is 55 to 60%. The left ventricle has normal function. The left ventricle has no regional wall motion abnormalities. Left ventricular diastolic parameters are consistent with Grade I diastolic dysfunction (impaired relaxation).  3. Right ventricular systolic function is normal. The right ventricular size is normal.  4. Left atrial size was mildly dilated.  5. PISA radius: 1.4 cm     MR ERO: 0.73 cm2     MR Volume: 123 ml. The mitral valve is normal in structure. Severe mitral valve regurgitation. No evidence of mitral stenosis. There is moderate late systolic prolapse of  the middle scallop of the posterior leaflet of the mitral valve.  6. The aortic valve is normal in structure. Aortic valve regurgitation is not visualized. No aortic stenosis is present.  7. The inferior vena cava is normal in size with greater than 50% respiratory variability, suggesting right atrial pressure of 3 mmHg. Comparison(s): Prior images reviewed side by side. 04/20/20 EF 55-60%. Moderate MR. Has progressed from prior. FINDINGS  Left Ventricle: Left ventricular ejection fraction, by estimation, is 55 to 60%. The left ventricle has normal function. The left ventricle has no regional wall motion abnormalities. 3D left ventricular ejection fraction analysis performed but not reported based on interpreter judgement due to suboptimal tracking. The left ventricular internal cavity size was normal in size. There is no  left ventricular hypertrophy. Left ventricular diastolic parameters are consistent with Grade I diastolic dysfunction (impaired relaxation). Right Ventricle: The right ventricular size is normal. No increase in right ventricular wall thickness. Right ventricular systolic function is normal. Left Atrium: Left atrial size was mildly dilated. Right Atrium: Right atrial size was normal in size. Pericardium: There is no evidence of pericardial effusion. Mitral Valve: PISA radius: 1.4 cm MR ERO: 0.73 cm2 MR Volume: 123 ml. The mitral valve is normal in structure. There is moderate late systolic prolapse of the middle scallop of the posterior leaflet of the mitral valve. Severe mitral valve regurgitation, with eccentric anteriorly directed jet. No evidence of mitral valve stenosis. Tricuspid Valve: The tricuspid valve is normal in structure. Tricuspid valve regurgitation is not demonstrated. No evidence of tricuspid stenosis. Aortic Valve: The aortic valve is normal in structure. Aortic valve regurgitation is not visualized. No aortic stenosis is present. Pulmonic Valve: The pulmonic valve was normal in structure. Pulmonic valve regurgitation is not visualized. No evidence of pulmonic stenosis. Aorta: The aortic root is normal in size and structure. Venous: The inferior vena cava is normal in size with greater than 50% respiratory variability, suggesting right atrial pressure of 3 mmHg. IAS/Shunts: No atrial level shunt detected by color flow Doppler. Additional Comments: Severe mitral regurgitation.  LEFT VENTRICLE PLAX 2D LVIDd:         4.50 cm   Diastology LVIDs:         3.20 cm   LV e' medial:    6.53 cm/s LV PW:         1.20 cm   LV E/e' medial:  15.6 LV IVS:        1.00 cm   LV e' lateral:   10.10 cm/s LVOT diam:     2.60 cm   LV E/e' lateral: 10.1 LV SV:         88 LV SV Index:   38        2D Longitudinal Strain LVOT Area:     5.31 cm  2D Strain GLS (A2C):   -16.7 %                          2D Strain GLS (A3C):    -21.3 %                          2D Strain GLS (A4C):   -20.9 %                          2D Strain GLS Avg:     -19.6 %  3D Volume EF:                          3D EF:        39 %                          LV EDV:       135 ml                          LV ESV:       82 ml                          LV SV:        53 ml RIGHT VENTRICLE            IVC RV Basal diam:  3.60 cm    IVC diam: 1.60 cm RV S prime:     8.05 cm/s TAPSE (M-mode): 2.1 cm LEFT ATRIUM             Index        RIGHT ATRIUM           Index LA diam:        4.50 cm 1.97 cm/m   RA Pressure: 3.00 mmHg LA Vol (A2C):   54.4 ml 23.76 ml/m  RA Area:     15.40 cm LA Vol (A4C):   78.4 ml 34.24 ml/m  RA Volume:   35.80 ml  15.63 ml/m LA Biplane Vol: 67.3 ml 29.39 ml/m  AORTIC VALVE LVOT Vmax:   96.40 cm/s LVOT Vmean:  52.900 cm/s LVOT VTI:    0.165 m  AORTA Ao Root diam: 4.00 cm Ao Asc diam:  3.80 cm MITRAL VALVE                  TRICUSPID VALVE                               Estimated RAP:  3.00 mmHg MV Decel Time: 208 msec MR Peak grad:    114.2 mmHg   SHUNTS MR Mean grad:    80.3 mmHg    Systemic VTI:  0.16 m MR Vmax:         534.33 cm/s  Systemic Diam: 2.60 cm MR Vmean:        427.7 cm/s MR PISA:         6.28 cm MR PISA Eff ROA: 39 mm MR PISA Radius:  1.00 cm MV E velocity: 102.00 cm/s MV A velocity: 95.90 cm/s MV E/A ratio:  1.06 Candee Furbish MD Electronically signed by Candee Furbish MD Signature Date/Time: 11/13/2021/1:25:08 PM    Final    IR Radiologist Eval & Mgmt  Result Date: 10/30/2021 Please refer to notes tab for details about interventional procedure. (Op Note)   Antibiotics:  Anti-infectives (From admission, onward)    Start     Dose/Rate Route Frequency Ordered Stop   11/21/21 1700  ceFAZolin (ANCEF) IVPB 2g/100 mL premix        2 g 200 mL/hr over 30 Minutes Intravenous Every 8 hours 11/21/21 1147 11/22/21 0859   11/21/21 0645  ceFAZolin (ANCEF) IVPB 2g/100 mL premix        2 g 200 mL/hr over 30 Minutes  Intravenous On call to O.R. 11/21/21 [id number]  11/21/21 0905       Discharge Exam: Blood pressure 116/85, pulse 85, temperature 98.4 F (36.9 C), resp. rate 20, height 6' (1.829 m), weight 107.5 kg, SpO2 93 %. Neurologic: Grossly normal Ambulaitng and voiding well incision cdi   Discharge Medications:   Allergies as of 11/21/2021   No Known Allergies      Medication List     TAKE these medications    allopurinol 300 MG tablet Commonly known as: ZYLOPRIM Take 1 tablet (300 mg total) by mouth daily.   apixaban 5 MG Tabs tablet Commonly known as: Eliquis Take 1 tablet (5 mg total) by mouth 2 (two) times daily.   atorvastatin 40 MG tablet Commonly known as: LIPITOR Take 1 tablet (40 mg total) by mouth daily.   cyclobenzaprine 10 MG tablet Commonly known as: FLEXERIL TAKE 1 TABLET BY MOUTH 3 TIMES DAILY AS NEEDED FOR MUSCLE SPASMS   diazepam 5 MG tablet Commonly known as: VALIUM Take 1 tablet (5 mg total) by mouth every 12 (twelve) hours as needed for anxiety.   famotidine 20 MG tablet Commonly known as: PEPCID Take 20 mg by mouth daily. After supper   gabapentin 100 MG capsule Commonly known as: NEURONTIN Take 1 capsule (100 mg total) by mouth 4 (four) times daily.   methocarbamol 500 MG tablet Commonly known as: Robaxin Take 1 tablet (500 mg total) by mouth 4 (four) times daily.   oxyCODONE-acetaminophen 5-325 MG tablet Commonly known as: Percocet Take 1 tablet by mouth every 4 (four) hours as needed for severe pain. What changed: You were already taking a medication with the same name, and this prescription was added. Make sure you understand how and when to take each.   oxyCODONE-acetaminophen 10-325 MG tablet Commonly known as: PERCOCET Take 1 tablet by mouth every 6 (six) hours as needed for pain. Start taking on: January 04, 2022 What changed: Another medication with the same name was added. Make sure you understand how and when to take each.   PARoxetine 20  MG tablet Commonly known as: PAXIL Take 1 tablet (20 mg total) by mouth every morning.   psyllium 58.6 % packet Commonly known as: METAMUCIL Take 1 packet by mouth daily as needed (for constipation).   rivaroxaban 20 MG Tabs tablet Commonly known as: XARELTO Take 1 tablet (20 mg total) by mouth daily with supper.   telmisartan-hydrochlorothiazide 80-25 MG tablet Commonly known as: MICARDIS HCT TAKE 1 TABLET BY MOUTH EVERY DAY        Disposition: home   Final Dx: Decompressive lumbar hemilaminectomy medial facetectomy and foraminotomies L3-4 on the left utilizing microscopic dissection    Discharge Instructions      Remove dressing in 72 hours   Complete by: As directed    Diet - low sodium heart healthy   Complete by: As directed    Driving Restrictions   Complete by: As directed    No driving for 2 weeks, no riding in the car for 1 week   Increase activity slowly   Complete by: As directed    Lifting restrictions   Complete by: As directed    No lifting more than 8 lbs          Signed: Ocie Cornfield Kla Bily 11/21/2021, 3:09 PM

## 2021-11-21 NOTE — H&P (Signed)
Subjective: Patient is a 76 y.o. male admitted for left leg pain. Onset of symptoms was several months ago, gradually worsening since that time.  The pain is rated severe, and is located at the across the lower back and radiates to LLE. The pain is described as aching and occurs all day. The symptoms have been progressive. Symptoms are exacerbated by exercise, standing, and walking for more than a few minutes. MRI or CT showed HNP with stenosis L3-4   Past Medical History:  Diagnosis Date   Arthritis    neck and back    Chronic neck pain    Depression    ED (erectile dysfunction)    GERD (gastroesophageal reflux disease)    dysphagia   Gout    sees Dr. Leigh Aurora    Hyperlipidemia    Hypertension    OSA (obstructive sleep apnea) 12/18/2017   Severe with AHI at 70.1/hr and is now on CPAP at 11cm H2o   Prostate cancer [facility])    history of   Pulmonary embolism ([place]) 06/2019   Sleep apnea    snoring/never checked    Past Surgical History:  Procedure Laterality Date   AMPUTATION Left 07/18/2018   Procedure: LEFT RING FINGER REVISION AMPUTATION;  Surgeon: Leanora Cover, MD;  Location: [facility];  Service: Orthopedics;  Laterality: Left;   CERVICAL FUSION  12/02/2007   per Dr. Sherley Bounds   COLONOSCOPY  03/11/2017   per Dr. Ardis Hughs, clear, no repeats needed    injection lower back Right 09/17/2016   IR IVC FILTER PLMT / S&I /IMG GUID/MOD SED  11/15/2021   IR RADIOLOGIST EVAL & MGMT  10/30/2021   PROSTATECTOMY  2000   ROTATOR CUFF REPAIR Right 11/2017   TONSILLECTOMY     as a child   VASECTOMY      Prior to Admission medications   Medication Sig Start Date End Date Taking? Authorizing Provider  allopurinol (ZYLOPRIM) 300 MG tablet Take 1 tablet (300 mg total) by mouth daily. 10/19/21  Yes [name], MD  apixaban (ELIQUIS) 5 MG TABS tablet Take 1 tablet (5 mg total) by mouth 2 (two) times daily. 11/01/21  Yes [name], MD  atorvastatin (LIPITOR) 40 MG tablet Take 1 tablet  (40 mg total) by mouth daily. 10/19/21  Yes [name], MD  cyclobenzaprine (FLEXERIL) 10 MG tablet TAKE 1 TABLET BY MOUTH 3 TIMES DAILY AS NEEDED FOR MUSCLE SPASMS 10/22/21  Yes [name], MD  famotidine (PEPCID) 20 MG tablet Take 20 mg by mouth daily. After supper   Yes [provider]  gabapentin (NEURONTIN) 100 MG capsule Take 1 capsule (100 mg total) by mouth 4 (four) times daily. 08/23/21  Yes [name], MD  oxyCODONE-acetaminophen (PERCOCET) 10-325 MG tablet Take 1 tablet by mouth every 6 (six) hours as needed for pain. 01/04/22 02/03/22 Yes [name], MD  PARoxetine (PAXIL) 20 MG tablet Take 1 tablet (20 mg total) by mouth every morning. 10/19/21  Yes [name], MD  psyllium (METAMUCIL) 58.6 % packet Take 1 packet by mouth daily as needed (for constipation).    Yes [provider]  telmisartan-hydrochlorothiazide (MICARDIS HCT) 80-25 MG tablet TAKE 1 TABLET BY MOUTH EVERY DAY 07/02/21  Yes Tanda Rockers, MD  diazepam (VALIUM) 5 MG tablet Take 1 tablet (5 mg total) by mouth every 12 (twelve) hours as needed for anxiety. Patient not taking: Reported on 11/12/2021 07/06/21   [name], MD  rivaroxaban (XARELTO) 20 MG TABS tablet Take 1 tablet (20 mg total) by mouth daily with supper. Patient not taking: Reported on 11/12/2021 10/08/21   [name], MD   No Known Allergies  Social History   Tobacco Use   Smoking status: Never   Smokeless tobacco: Never  Substance Use Topics   Alcohol use: Yes    Alcohol/week: 2.0 standard drinks    Types: 2 Cans of beer per week    Family History  Problem Relation Age of Onset   Cancer Other        breast, porstate   Hypertension Other    Stroke Other    Breast cancer Mother    Diverticulitis Mother    Heart disease Father    Prostate cancer Father    Stroke Father    Dementia Sister    Colon cancer Neg Hx      Review of Systems  Positive ROS: neg  All other systems have been reviewed and were  otherwise negative with the exception of those mentioned in the HPI and as above.  Objective: Vital signs in last 24 hours: Temp:  [97.8 F (36.6 C)] 97.8 F (36.6 C) (01/18 0641) Pulse Rate:  [76] 76 (01/18 0641) Resp:  [18] 18 (01/18 0641) BP: (154)/(80) 154/80 (01/18 0641) SpO2:  [100 %] 100 % (01/18 0641) Weight:  [107.5 kg] 107.5 kg (01/18 0641)  General Appearance: Alert, cooperative, no distress, appears stated age Head: Normocephalic, without obvious abnormality, atraumatic Eyes: PERRL, conjunctiva/corneas clear, EOM's intact    Neck: Supple, symmetrical, trachea midline Back: Symmetric, no curvature, ROM normal, no CVA tenderness Lungs:  respirations unlabored Heart: Regular rate and rhythm Abdomen: Soft, non-tender Extremities: Extremities normal, atraumatic, no cyanosis or edema Pulses: 2+ and symmetric all extremities Skin: Skin color, texture, turgor normal, no rashes or lesions  NEUROLOGIC:   Mental status: Alert and oriented x4,  no aphasia, good attention span, fund of knowledge, and memory Motor Exam - grossly normal Sensory Exam - grossly normal Reflexes: 1+ Coordination - grossly normal Gait - grossly normal Balance - grossly normal Cranial Nerves: I: smell Not tested  II: visual acuity  OS: nl    OD: nl  II: visual fields Full to confrontation  II: pupils Equal, round, reactive to light  III,VII: ptosis None  III,IV,VI: extraocular muscles  Full ROM  V: mastication Normal  V: facial light touch sensation  Normal  V,VII: corneal reflex  Present  VII: facial muscle function - upper  Normal  VII: facial muscle function - lower Normal  VIII: hearing Not tested  IX: soft palate elevation  Normal  IX,X: gag reflex Present  XI: trapezius strength  5/5  XI: sternocleidomastoid strength 5/5  XI: neck flexion strength  5/5  XII: tongue strength  Normal    Data Review Lab Results  Component Value Date   WBC 8.2 11/15/2021   HGB 13.7 11/15/2021    HCT 40.4 11/15/2021   MCV 93.1 11/15/2021   PLT 269 11/15/2021   Lab Results  Component Value Date   NA 140 11/15/2021   K 4.1 11/15/2021   CL 102 11/15/2021   CO2 26 11/15/2021   BUN 15 11/15/2021   CREATININE 1.03 11/15/2021   GLUCOSE 102 (H) 11/15/2021   Lab Results  Component Value Date   INR 0.8 11/19/2021    Assessment/Plan:  Estimated body mass index is 32.14 kg/m as calculated from the following:   Height as of this encounter: 6' (  1.829 m).   Weight as of this encounter: 107.5 kg. Patient admitted for L L3-4 decompression and diskectomy. Patient has failed a reasonable attempt at conservative therapy.  I explained the condition and procedure to the patient and answered any questions.  Patient wishes to proceed with procedure as planned. Understands risks/ benefits and typical outcomes of procedure.   [name] 11/21/2021 8:29 AM

## 2021-11-21 NOTE — Anesthesia Procedure Notes (Signed)
Procedure Name: Intubation Date/Time: 11/21/2021 8:40 AM Performed by: Kyung Rudd, CRNA Pre-anesthesia Checklist: Patient identified, Emergency Drugs available, Suction available and Patient being monitored Patient Re-evaluated:Patient Re-evaluated prior to induction Oxygen Delivery Method: Circle system utilized Preoxygenation: Pre-oxygenation with 100% oxygen Induction Type: IV induction Ventilation: Mask ventilation without difficulty and Oral airway inserted - appropriate to patient size Laryngoscope Size: Mac and 4 Grade View: Grade I Tube type: Oral Tube size: 7.5 mm Number of attempts: 1 Airway Equipment and Method: Stylet Placement Confirmation: ETT inserted through vocal cords under direct vision, positive ETCO2 and breath sounds checked- equal and bilateral Secured at: 21 cm Tube secured with: Tape Dental Injury: Teeth and Oropharynx as per pre-operative assessment

## 2021-11-21 NOTE — Plan of Care (Signed)
Pt doing well. Pt given D/C instructions with verbal understanding. Rx's were sent to the pharmacy by MD. Pt's incision is clean and dry with no sign of infection. Pt's IV was removed prior to D/C. Pt D/C'd home via wheelchair per MD order. Pt is stable @ D/C and has no other needs at this time. Nestor Wieneke, RN  

## 2021-11-21 NOTE — Op Note (Signed)
11/21/2021  10:47 AM  PATIENT:  Ricky Meyers  76 y.o. male  PRE-OPERATIVE DIAGNOSIS: Lumbar spinal stenosis L3-4 left with left lower extremity radiculopathy  POST-OPERATIVE DIAGNOSIS:  same  PROCEDURE: Decompressive lumbar hemilaminectomy medial facetectomy and foraminotomies L3-4 on the left utilizing microscopic dissection  SURGEON:  Sherley Bounds, MD  ASSISTANTS: Glenford Peers FNP  ANESTHESIA:   General  EBL: 50 ml  Total I/O In: -  Out: 50 [Blood:50]  BLOOD ADMINISTERED: none  DRAINS: None  SPECIMEN:  none  INDICATION FOR PROCEDURE: This patient presented with severe left-sided low back pain with radiation into the left lateral leg sometimes to the knee. Imaging showed lumbar spinal stenosis L3-4 on the left with what looked like a superior free fragment disc herniation. The patient tried conservative measures without relief. Pain was debilitating. Recommended decompressive lumbar hemilaminectomy with possible microdiscectomy. Patient understood the risks, benefits, and alternatives and potential outcomes and wished to proceed.  PROCEDURE DETAILS: The patient was taken to the operating room and after induction of adequate generalized endotracheal anesthesia, the patient was rolled into the prone position on the Wilson frame and all pressure points were padded. The lumbar region was cleaned and then prepped with DuraPrep and draped in the usual sterile fashion. 5 cc of local anesthesia was injected and then a dorsal midline incision was made and carried down to the lumbo sacral fascia. The fascia was opened and the paraspinous musculature was taken down in a subperiosteal fashion to expose L3-4 on the left. Intraoperative x-ray confirmed my level, and then I used a combination of the high-speed drill and the Kerrison punches to perform a hemilaminectomy, medial facetectomy, and foraminotomy at L3-4 on the left. The underlying yellow ligament was opened and removed in a piecemeal  fashion to expose the underlying dura and exiting nerve root. I undercut the lateral recess and dissected down until I was medial to and distal to the pedicle.  I further drilled the medial facet laterally so I could perform even a more aggressive lateral recess decompression.  I carried the laminectomy up more superior than usual so that I could decompress the L3 nerve root and make sure there was no disc herniation there.  The nerve root was well decompressed. We then gently retracted the nerve root medially with a retractor, coagulated the epidural venous vasculature, and inspected the disc space.  We found a subannular bulge but did not find a free fragment under the L3 nerve root or in the lateral recess.  We spent considerable time feeling with a coronary dilator and a nerve hook to make sure there was no fragment of disc.  I palpated with a coronary dilator along the nerve root and into the foramen to assure adequate decompression. I felt no more compression of the nerve root. I irrigated with saline solution containing bacitracin. Achieved hemostasis with bipolar cautery, lined the dura with Gelfoam, and then closed the fascia with 0 Vicryl. I closed the subcutaneous tissues with 2-0 Vicryl and the subcuticular tissues with 3-0 Vicryl. The skin was then closed with benzoin and Steri-Strips. The drapes were removed, a sterile dressing was applied.  My nurse practitioner was involved in the exposure, safe retraction of the neural elements, the disc work and the closure. the patient was awakened from general anesthesia and transferred to the recovery room in stable condition. At the end of the procedure all sponge, needle and instrument counts were correct.   PLAN OF CARE: Admit for overnight observation  PATIENT DISPOSITION:  PACU - hemodynamically stable.   Delay start of Pharmacological VTE agent (>24hrs) due to surgical blood loss or risk of bleeding:  yes

## 2021-11-21 NOTE — Telephone Encounter (Signed)
° °  Telephone encounter was:  Unsuccessful.  11/21/2021 Name: Ricky Meyers MRN: [id number] DOB: February 08, 1946  Unsuccessful outbound call made today to assist with:   hearing aids  Outreach Attempt:  1st Attempt  Patient is currently inpatient admitted on 11/21/21.  I will follow up with patient once he returns home.  Brandonn Capelli, AAS Paralegal, [place] [facility]  [address], [place] [address] ??[email or web address]   ?? [phone]   [email or web address]

## 2021-11-21 NOTE — Anesthesia Postprocedure Evaluation (Signed)
Anesthesia Post Note  Patient: Ricky Meyers  Procedure(s) Performed: Laminectomy and Foraminotomy - left - Lumbar three-four (Left: Back)     Patient location during evaluation: PACU Anesthesia Type: General Level of consciousness: awake and alert Pain management: pain level controlled Vital Signs Assessment: post-procedure vital signs reviewed and stable Respiratory status: spontaneous breathing, nonlabored ventilation, respiratory function stable and patient connected to nasal cannula oxygen Cardiovascular status: blood pressure returned to baseline and stable Postop Assessment: no apparent nausea or vomiting Anesthetic complications: no   No notable events documented.  Last Vitals:  Vitals:   11/21/21 1125 11/21/21 1145  BP: 118/82 116/85  Pulse: 68 85  Resp: 12 20  Temp: 36.9 C   SpO2: 100% 93%    Last Pain:  Vitals:   11/21/21 1325  TempSrc:   PainSc: 7                  Tiajuana Amass

## 2021-11-21 NOTE — Transfer of Care (Signed)
Immediate Anesthesia Transfer of Care Note  Patient: Ricky Meyers  Procedure(s) Performed: Laminectomy and Foraminotomy - left - Lumbar three-four (Left: Back)  Patient Location: PACU  Anesthesia Type:General  Level of Consciousness: awake, alert  and oriented  Airway & Oxygen Therapy: Patient Spontanous Breathing  Post-op Assessment: Report given to RN, Post -op Vital signs reviewed and stable and Patient moving all extremities X 4  Post vital signs: Reviewed and stable  Last Vitals:  Vitals Value Taken Time  BP 114/67 11/21/21 1055  Temp 36.9 C 11/21/21 1055  Pulse 79 11/21/21 1056  Resp 20 11/21/21 1056  SpO2 88 % 11/21/21 1056  Vitals shown include unvalidated device data.  Last Pain:  Vitals:   11/21/21 0656  TempSrc:   PainSc: 4          Complications: No notable events documented.

## 2021-11-21 NOTE — Discharge Instructions (Signed)

## 2021-11-22 ENCOUNTER — Encounter (HOSPITAL_COMMUNITY): Payer: Self-pay | Admitting: Neurological Surgery

## 2021-11-29 ENCOUNTER — Telehealth: Payer: Self-pay

## 2021-11-29 NOTE — Telephone Encounter (Signed)
Per Dr. Irish Lack, called patient to arrange consult to evaluate MR.   Left message to call back.

## 2021-11-29 NOTE — Telephone Encounter (Signed)
The patient reports he has an appointment with his neurosurgeon next Tuesday (1/31). He will call after that appointment to arrange a consult with the Structural team.  He was grateful for follow-up.

## 2021-11-29 NOTE — Telephone Encounter (Signed)
-----   Message from Sherren Mocha, MD sent at 11/14/2021 12:07 PM EST ----- Sounds like his sx's are minimal so I think that would be ok.  ----- Message ----- From: [name], MD Sent: 11/14/2021  11:17 AM EST To: Thompson Grayer, RN, Sherren Mocha, MD, #  Thanks.  Will have him see one of you.  I was going to let him have surgery... based on the MR, do you think it is better to wait?  Back pain is his biggest complaint right now.    JV ----- Message ----- From: Sherren Mocha, MD Sent: 11/14/2021   9:37 AM EST To: [name], MD, Theodoro Parma, RN, #  Ulice Dash - I reviewed his echo and suspect he has very severe MR. Would be reasonable to do cath/TEE to see if he is a candidate for mitral valve repair with Clip or surgery. Arun or I could see in consult to discuss with him and set up testing if he is agreeable.

## 2021-11-30 ENCOUNTER — Telehealth: Payer: Self-pay

## 2021-11-30 NOTE — Telephone Encounter (Signed)
° °  Telephone encounter was:  Successful.  11/30/2021 Name: Ricky Meyers MRN: [id number] DOB: 09/12/1946  Ricky Meyers is a 76 y.o. year old male who is a primary care patient of [name], MD . The community resource team was consulted for assistance with  hearing aids  Care guide performed the following interventions: Spoke with patient's wife Izora Gala, patient was out so I told her I would call him Monday 12/03/21.  Follow Up Plan:   I will attempt to call the patient again on Monday 12/03/21.  Taesha Goodell, AAS Paralegal, [facility]  [address], [place] [address] ??[email or web address]   ?? [phone]   [email or web address]

## 2021-12-03 ENCOUNTER — Telehealth: Payer: Self-pay | Admitting: Pharmacist

## 2021-12-03 NOTE — Chronic Care Management (AMB) (Signed)
° ° °  Chronic Care Management Pharmacy Assistant   Name: Ricky Meyers  MRN: [id number] DOB: September 25, 1946  Reason for Encounter: Follow up Eliquis patient assistance. Application mailed to patient 11/02/21.  Spoke with patient, he states he received an approval letter for Eliquis through 11/03/2022 and has already received a 90 day supply of Eliquis.   Care Gaps: AWV - scheduled for 11/14/21 Last BP - 92/74 on 08/23/2021 Covid-19 vaccine - overdue  Zoster vaccines - overdue  Star Rating Drugs: Atorvastatin 40mg  - last filled on 10/23/21 90DS at [facility] 80-25mg  - last filled on 10/22/21 90DS at [facility] [phone]

## 2021-12-04 ENCOUNTER — Telehealth: Payer: Self-pay

## 2021-12-04 NOTE — Telephone Encounter (Signed)
° °  Telephone encounter was:  Successful.  12/04/2021 Name: Ricky Meyers MRN: [id number] DOB: 04-Feb-1946  Ricky Meyers is a 76 y.o. year old male who is a primary care patient of [name], MD . The community resource team was consulted for assistance with  hearing aids  Care guide performed the following interventions: Spoke with patient about [facility].  Verified patient's email address [email or web address] Letter saved in Epic.            Follow Up Plan:  Care guide will follow up with patient by phone over the next 3-43 days  Pastor Sgro, AAS Paralegal, [facility]  [address], [place] [address] ??[email or web address]   ?? [phone]   [email or web address]

## 2021-12-05 ENCOUNTER — Telehealth: Payer: Self-pay | Admitting: Cardiology

## 2021-12-05 ENCOUNTER — Telehealth: Payer: Self-pay

## 2021-12-05 NOTE — Telephone Encounter (Signed)
° °  Telephone encounter was:  Successful.  12/05/2021 Name: Ricky Meyers MRN: [id number] DOB: 30-Dec-1945  [name] is a 76 y.o. year old male who is a primary care patient of [name], MD . The community resource team was consulted for assistance with  hearing aids  Care guide performed the following interventions: Received confirmation email from spouse.  Follow Up Plan:  No further follow up planned at this time. The patient has been provided with needed resources.  Zamia Tyminski, AAS Paralegal, [facility]  [address], [place] [address] ??[email or web address]   ?? [phone]   [email or web address]

## 2021-12-05 NOTE — Telephone Encounter (Signed)
Spoke to patient regarding consultation regarding severe MR per Dr. Irish Lack. Patient and wife interested in scheduling consultation this Friday, 12/07/21 with Dr. Ali Lowe for further evaluation.   Kathyrn Drown NP-C

## 2021-12-05 NOTE — Telephone Encounter (Signed)
° °  Telephone encounter was:  Successful.  12/05/2021 Name: KERNEY HOPFENSPERGER MRN: [id number] DOB: 03-30-1946  YOSHIAKI KREUSER is a 75 y.o. year old male who is a primary care patient of [name], MD . The community resource team was consulted for assistance with  hearing aids  Care guide performed the following interventions: Spoke with patient's wife Minette Brine to follow-up with  bounced back email that was sent 1/31 containing information for [facility].   Minette Brine gave me her email [email or web address] resent information.  Minette Brine has my name and number and stated she would call me if she does not receive the email.  Follow Up Plan:  No further follow up planned at this time. The patient has been provided with needed resources.  Rosi Secrist, AAS Paralegal, [facility]  [address], [place] [address] ??[email or web address]   ?? [phone]   [email or web address]

## 2021-12-06 ENCOUNTER — Encounter: Payer: Self-pay | Admitting: Family Medicine

## 2021-12-06 DIAGNOSIS — H9190 Unspecified hearing loss, unspecified ear: Secondary | ICD-10-CM

## 2021-12-07 ENCOUNTER — Encounter: Payer: Self-pay | Admitting: *Deleted

## 2021-12-07 ENCOUNTER — Other Ambulatory Visit: Payer: Self-pay | Admitting: Interventional Radiology

## 2021-12-07 ENCOUNTER — Encounter: Payer: Self-pay | Admitting: Internal Medicine

## 2021-12-07 ENCOUNTER — Other Ambulatory Visit: Payer: Self-pay

## 2021-12-07 ENCOUNTER — Ambulatory Visit (INDEPENDENT_AMBULATORY_CARE_PROVIDER_SITE_OTHER): Payer: Medicare Other | Admitting: Internal Medicine

## 2021-12-07 VITALS — BP 124/80 | HR 99 | Ht 72.0 in | Wt 238.0 lb

## 2021-12-07 DIAGNOSIS — I340 Nonrheumatic mitral (valve) insufficiency: Secondary | ICD-10-CM

## 2021-12-07 DIAGNOSIS — Z95828 Presence of other vascular implants and grafts: Secondary | ICD-10-CM

## 2021-12-07 DIAGNOSIS — R0600 Dyspnea, unspecified: Secondary | ICD-10-CM | POA: Diagnosis not present

## 2021-12-07 NOTE — Progress Notes (Addendum)
Patient ID: ENDI LAGMAN MRN: [id number] DOB/AGE: 11/08/1945 76 y.o.  Primary Care Physician:Fry, Ishmael Holter, MD Primary Cardiologist: Casandra Doffing, MD   FOCUSED CARDIOVASCULAR PROBLEM LIST:   1.  Severe degenerative mitral regurgitation 2.  Idiopathic DVT and PE on indefinite anticoagulation 3.  Retrievable IVC filter placed prior to decompressive lumbar hemilaminectomy January 2023 4.  Hypertension 5.  Hyperlipidemia   HISTORY OF PRESENT ILLNESS: The patient is a 76 y.o. male with the indicated medical history here for for recommendations regarding severe mitral regurgitation noted on recent echocardiogram.  The patient recently underwent lumbar neurosurgery without complications.  Prior to this he had noted maybe 6 months worth of increasing dyspnea.  He had more of an issue with completing his regular golf games.  He denied any shortness of breath at rest.  He has been slowing down because his back has been bothering him.  He has put on some weight as well.  After his surgery he is becoming more active but he is noted some shortness of breath.  He tells me that he is more short of breath and fatigue.  He has trouble going to his mailbox.  Fortunately he has had no dyspnea at rest.  He is unclear about whether this is from deconditioning or not but he feels that his shortness of breath has been going on for some time.  He denies any exertional angina.  He denies any peripheral edema, paroxysmal atrial dyspnea, or orthopnea.  He has required no hospitalizations or emergency room visits.  He is quite motivated to regain his functional capacity because he would like to be very active and play a lot of golf.  He and his wife right now think he would not be able to do this based on how his breathing was prior to his surgical procedure.  He is retired from [employer].  He did a lot of traveling and heavy lifting which attributed to his back issues.  Interestingly a few  years ago he did sustain what seems to be an unprovoked DVT and PE.  He tells me he was in his driveway at home.  He is doing all of his activities of daily living without any long stretches of being sedentary.  He became acutely short of breath and was diagnosed with a PE and DVT.  Decision to place him on indefinite anticoagulation was then made.  He has been tolerating anticoagulation well.  Of note prior to his surgical procedure he did have an IVC filter placed with plans to retrieve this soon.  In terms of his dental health he reports that he sees a dentist on a regular basis.  He does have a temporary tooth and is waiting for a permanent tooth to be fashioned.  He is not in need of any dental extractions.  Past Medical History:  Diagnosis Date   Arthritis    neck and back    Chronic neck pain    Depression    ED (erectile dysfunction)    GERD (gastroesophageal reflux disease)    dysphagia   Gout    sees Dr. Leigh Aurora    Hyperlipidemia    Hypertension    OSA (obstructive sleep apnea) 12/18/2017   Severe with AHI at 70.1/hr and is now on CPAP at 11cm H2o   Prostate cancer [facility])    history of   Pulmonary embolism ([place]) 06/2019   Sleep apnea    snoring/never checked  Past Surgical History:  Procedure Laterality Date   AMPUTATION Left 07/18/2018   Procedure: LEFT RING FINGER REVISION AMPUTATION;  Surgeon: Leanora Cover, MD;  Location: [place];  Service: Orthopedics;  Laterality: Left;   CERVICAL FUSION  12/02/2007   per Dr. Sherley Bounds   COLONOSCOPY  03/11/2017   per Dr. Ardis Hughs, clear, no repeats needed    injection lower back Right 09/17/2016   IR IVC FILTER PLMT / S&I /IMG GUID/MOD SED  11/15/2021   IR RADIOLOGIST EVAL & MGMT  10/30/2021   LUMBAR LAMINECTOMY/DECOMPRESSION MICRODISCECTOMY Left 11/21/2021   Procedure: Laminectomy and Foraminotomy - left - Lumbar three-four;  Surgeon: [name], MD;  Location: [place];  Service: Neurosurgery;  Laterality: Left;    PROSTATECTOMY  2000   ROTATOR CUFF REPAIR Right 11/2017   TONSILLECTOMY     as a child   VASECTOMY      Family History  Problem Relation Age of Onset   Cancer Other        breast, porstate   Hypertension Other    Stroke Other    Breast cancer Mother    Diverticulitis Mother    Heart disease Father    Prostate cancer Father    Stroke Father    Dementia Sister    Colon cancer Neg Hx     Social History   Socioeconomic History   Marital status: Divorced    Spouse name: Not on file   Number of children: 2   Years of education: Not on file   Highest education level: Not on file  Occupational History   Occupation: retired  Tobacco Use   Smoking status: Never   Smokeless tobacco: Never  Vaping Use   Vaping Use: Never used  Substance and Sexual Activity   Alcohol use: Yes    Alcohol/week: 2.0 standard drinks    Types: 2 Cans of beer per week   Drug use: Not Currently    Comment: oxycodone is prescribed   Sexual activity: Not on file  Other Topics Concern   Not on file  Social History Narrative   Not on file   Social Determinants of Health   Financial Resource Strain: Low Risk    Difficulty of Paying Living Expenses: Not hard at all  Food Insecurity: No Food Insecurity   Worried About Charity fundraiser in the Last Year: Never true   [place] in the Last Year: Never true  Transportation Needs: No Transportation Needs   Lack of Transportation (Medical): No   Lack of Transportation (Non-Medical): No  Physical Activity: Inactive   Days of Exercise per Week: 0 days   Minutes of Exercise per Session: 0 min  Stress: No Stress Concern Present   Feeling of Stress : Not at all  Social Connections: Not on file  Intimate Partner Violence: Not on file     Prior to Admission medications   Medication Sig Start Date End Date Taking? Authorizing Provider  allopurinol (ZYLOPRIM) 300 MG tablet Take 1 tablet (300 mg total) by mouth daily. 10/19/21   [name], MD   apixaban (ELIQUIS) 5 MG TABS tablet Take 1 tablet (5 mg total) by mouth 2 (two) times daily. 11/01/21   [name], MD  atorvastatin (LIPITOR) 40 MG tablet Take 1 tablet (40 mg total) by mouth daily. 10/19/21   [name], MD  cyclobenzaprine (FLEXERIL) 10 MG tablet TAKE 1 TABLET BY MOUTH 3 TIMES DAILY AS NEEDED FOR MUSCLE  SPASMS 10/22/21   [name], MD  diazepam (VALIUM) 5 MG tablet Take 1 tablet (5 mg total) by mouth every 12 (twelve) hours as needed for anxiety. Patient not taking: Reported on 11/12/2021 07/06/21   [name], MD  famotidine (PEPCID) 20 MG tablet Take 20 mg by mouth daily. After supper    [provider]  gabapentin (NEURONTIN) 100 MG capsule Take 1 capsule (100 mg total) by mouth 4 (four) times daily. 08/23/21   [name], MD  methocarbamol (ROBAXIN) 500 MG tablet Take 1 tablet (500 mg total) by mouth 4 (four) times daily. 11/21/21   Meyran, Ocie Cornfield, NP  oxyCODONE-acetaminophen (PERCOCET) 10-325 MG tablet Take 1 tablet by mouth every 6 (six) hours as needed for pain. 01/04/22 02/03/22  [name], MD  oxyCODONE-acetaminophen (PERCOCET) 5-325 MG tablet Take 1 tablet by mouth every 4 (four) hours as needed for severe pain. 11/21/21 11/21/22  Meyran, Ocie Cornfield, NP  PARoxetine (PAXIL) 20 MG tablet Take 1 tablet (20 mg total) by mouth every morning. 10/19/21   [name], MD  psyllium (METAMUCIL) 58.6 % packet Take 1 packet by mouth daily as needed (for constipation).     [provider]  rivaroxaban (XARELTO) 20 MG TABS tablet Take 1 tablet (20 mg total) by mouth daily with supper. Patient not taking: Reported on 11/12/2021 10/08/21   [name], MD  telmisartan-hydrochlorothiazide (MICARDIS HCT) 80-25 MG tablet TAKE 1 TABLET BY MOUTH EVERY DAY 07/02/21   Tanda Rockers, MD    No Known Allergies  REVIEW OF SYSTEMS:  General: no fevers/chills/night sweats Eyes: no blurry vision, diplopia, or amaurosis ENT: no sore throat or  hearing loss Resp: no cough, wheezing, or hemoptysis CV: no edema or palpitations GI: no abdominal pain, nausea, vomiting, diarrhea, or constipation GU: no dysuria, frequency, or hematuria Skin: no rash Neuro: no headache, numbness, tingling, or weakness of extremities Musculoskeletal: no joint pain or swelling Heme: no bleeding, DVT, or easy bruising Endo: no polydipsia or polyuria  BP 124/80    Pulse 99    Ht 6' (1.829 m)    Wt 238 lb (108 kg)    BMI 32.28 kg/m   PHYSICAL EXAM: GEN:  AO x 3 in no acute distress HEENT: normal Dentition: Normal Neck: JVP normal. +2carotid upstrokes without bruits. No thyromegaly. Lungs: equal expansion, clear bilaterally CV: Apex is discrete and nondisplaced, RRR with 4/6 holosystolic murmur across precordium Abd: soft, non-tender, non-distended; no bruit; positive bowel sounds Ext: no edema, ecchymoses, or cyanosis Vascular: 2+ femoral pulses, 2+ radial pulses       Skin: warm and dry without rash Neuro: CN II-XII grossly intact; motor and sensory grossly intact    DATA AND STUDIES:  EKG: Sinus rhythm with left bundle branch block  2D ECHO: Jan 2023  1. Severe mitral regurgitation.   2. Left ventricular ejection fraction, by estimation, is 55 to 60%. The  left ventricle has normal function. The left ventricle has no regional  wall motion abnormalities. Left ventricular diastolic parameters are  consistent with Grade I diastolic  dysfunction (impaired relaxation).   3. Right ventricular systolic function is normal. The right ventricular  size is normal.   4. Left atrial size was mildly dilated.   5. PISA radius: 1.4 cm      MR ERO: 0.73 cm2      MR Volume: 123 ml. The mitral valve is normal in structure. Severe  mitral valve regurgitation. No  evidence of mitral stenosis. There is  moderate late systolic prolapse of the middle scallop of the posterior  leaflet of the mitral valve.   6. The aortic valve is normal in structure. Aortic  valve regurgitation is  not visualized. No aortic stenosis is present.   7. The inferior vena cava is normal in size with greater than 50%  respiratory variability, suggesting right atrial pressure of 3 mmHg.  CARDIAC CATH: Not yet performed  Lexiscan 2023:   EKG shows no changes during Lexiscan stress   Myoview scan with probable normal perfusion and mild soft tissue attenuation (diaphragm)  No ischemia or scar   LVEF 59%.   Low risk study   No significant change from study from 2018.  STS RISK CALCULATOR:  Isolated MVR: Risk of Mortality: 1.663% Renal Failure: 2.670% Permanent Stroke: 1.126% Prolonged Ventilation: 6.450% DSW Infection: 0.119% Reoperation: 4.844% Morbidity or Mortality: 10.096% Short Length of Stay: 29.180% Long Length of Stay: 5.093%  MVR: Risk of Mortality: 0.626% Renal Failure: 1.132% Permanent Stroke: 1.027% Prolonged Ventilation: 2.956% DSW Infection: 0.062% Reoperation: 2.728% Morbidity or Mortality: 5.943% Short Length of Stay: 47.975% Long Length of Stay: 2.075%  NHYA CLASS: 2-3  ASSESSMENT AND PLAN:   Dyspnea, unspecified type  Nonrheumatic mitral valve regurgitation  Presence of IVC filter  The patient has developed dyspnea that I think is multifactorial with an element of deconditioning, body habitus, and severe degenerative mitral regurgitation all contributing.  We had a long talk about his symptoms and their burden on him.  It is clear that he was symptomatic prior to his surgery and this has been going on for some time.  He is very driven to regain his functional capacity following his back surgery.  I reviewed his echocardiogram which demonstrates a likely degenerative mitral regurgitation with posterior prolapse.  I will refer the patient for TEE, right heart catheterization, left heart catheterization, and coronary angiography study with Dr. Irish Lack as well as a cardiothoracic surgical consultation.  I think he  probably will have anatomy amenable to transcatheter mitral edge-to-edge repair.  If he does not then a surgical approach may be necessary.  Additionally the patient does have an IVC filter in place.  While not impossible it would probably be best to not have to navigate through this in case a mitral edge-to-edge repair were to be pursued.  For this reason I have asked the patient and his wife to reach out to have this retrieved in the coming weeks.  I have personally reviewed the patients imaging data as summarized above.  I have reviewed the natural history of mitral regurgitation with the patient and family members who are present today. We have discussed the limitations of medical therapy and the poor prognosis associated with symptomatic mitral regurgitation. We have also reviewed potential treatment options, including palliative medical therapy, conventional mitral surgery, and transcatheter mitral edge to edge repair. We discussed treatment options in the context of this patient's specific comorbid medical conditions.   All of the patient's questions were answered today. Will make further recommendations based on the results of studies outlined above.   Total time spent with patient today 60 minutes. This includes reviewing records, evaluating the patient and coordinating care.   [name], MD  12/07/2021 11:20 AM    [facility] [place], [place], [place]  [address] Phone: [phone]; Fax: [phone]

## 2021-12-07 NOTE — Patient Instructions (Signed)
Medication Instructions:  NO CHANGES If you need a refill on your cardiac medications before your next appointment, please call your pharmacy*   Lab Work: TODAY BMET AND CBC If you have labs (blood work) drawn today and your tests are completely normal, you will receive your results only by: [place] (if you have MyChart) OR A paper copy in the mail If you have any lab test that is abnormal or we need to change your treatment, we will call you to review the results.   Testing/Procedures: Your physician has requested that you have a TEE. During a TEE, sound waves are used to create images of your heart. It provides your doctor with information about the size and shape of your heart and how well your hearts chambers and valves are working. In this test, a transducer is attached to the end of a flexible tube thats guided down your throat and into your esophagus (the tube leading from you mouth to your stomach) to get a more detailed image of your heart. You are not awake for the procedure. Please see the instruction sheet given to you today. For further information please visit [email or web address].   [facility] [address] OFF[facility] [place], [place] [address] Dept: [phone] Loc: [phone]  Ricky Meyers  12/07/2021  You are scheduled for a Cardiac Catheterization on Thursday, February 16 with Dr. Larae Grooms.  1. Please arrive at the [facility] (Main Entrance A) at [facility]: [address] [place], [place] [address] at 5:30 AM (This time is two hours before your procedure to ensure your preparation). Free valet parking service is available.   Special note: Every effort is made to have your procedure done on time. Please understand that emergencies sometimes delay scheduled procedures.  2. Diet: Do not eat solid foods after midnight.  The patient may have clear liquids  until 5am upon the day of the procedure.  3. Labs: You will need to have blood drawn on TODAY BMET AND CBC,  4. Medication instructions in preparation for your procedure:          LAST DOSE OF ELIQUIS  AM ON 2/14   HOLD TELMISARTAN HCTZ AM OF CATH      On the morning of your procedure, take your Aspirin and any morning medicines NOT listed above.  You may use sips of water.  5. Plan for one night stay--bring personal belongings. 6. Bring a current list of your medications and current insurance cards. 7. You MUST have a responsible person to drive you home. 8. Someone MUST be with you the first 24 hours after you arrive home or your discharge will be delayed. 9. Please wear clothes that are easy to get on and off and wear slip-on shoes.  Thank you for allowing [place] to care for you!   --  Invasive Cardiovascular services    Follow-Up: At [facility], you and your health needs are our priority.  As part of our continuing mission to provide you with exceptional heart care, we have created designated Provider Care Teams.  These Care Teams include your primary Cardiologist (physician) and Advanced Practice Providers (APPs -  Physician Assistants and Nurse Practitioners) who all work together to provide you with the care you need, when you need it.  We recommend signing up for the patient portal called "MyChart".  Sign up information is provided on this After Visit Summary.  MyChart is used to connect with patients for Virtual Visits (Telemedicine).  Patients are able to view lab/test results, encounter notes, upcoming appointments, etc.  Non-urgent messages can be sent to your provider as well.   To learn more about what you can do with MyChart, go to [email or web address].    Your next appointment:   7-10 DAYS AFTER CATH   CATH IS 2/16  The format for your next appointment:   In Person  Provider:   APP     Other Instructions  NONE

## 2021-12-07 NOTE — H&P (View-Only) (Signed)
Patient ID: Ricky Meyers MRN: [id number] DOB/AGE: [birth year] 78 y.o.  Primary Care Physician:Fry, Ishmael Holter, MD Primary Cardiologist: Casandra Doffing, MD   FOCUSED CARDIOVASCULAR PROBLEM LIST:   1.  Severe degenerative mitral regurgitation 2.  Idiopathic DVT and PE on indefinite anticoagulation 3.  Retrievable IVC filter placed prior to decompressive lumbar hemilaminectomy January 2023 4.  Hypertension 5.  Hyperlipidemia   HISTORY OF PRESENT ILLNESS: The patient is a 76 y.o. male with the indicated medical history here for for recommendations regarding severe mitral regurgitation noted on recent echocardiogram.  The patient recently underwent lumbar neurosurgery without complications.  Prior to this he had noted maybe 6 months worth of increasing dyspnea.  He had more of an issue with completing his regular golf games.  He denied any shortness of breath at rest.  He has been slowing down because his back has been bothering him.  He has put on some weight as well.  After his surgery he is becoming more active but he is noted some shortness of breath.  He tells me that he is more short of breath and fatigue.  He has trouble going to his mailbox.  Fortunately he has had no dyspnea at rest.  He is unclear about whether this is from deconditioning or not but he feels that his shortness of breath has been going on for some time.  He denies any exertional angina.  He denies any peripheral edema, paroxysmal atrial dyspnea, or orthopnea.  He has required no hospitalizations or emergency room visits.  He is quite motivated to regain his functional capacity because he would like to be very active and play a lot of golf.  He and his wife right now think he would not be able to do this based on how his breathing was prior to his surgical procedure.  He is retired from [employer].  He did a lot of traveling and heavy lifting which attributed to his back issues.  Interestingly a few  years ago he did sustain what seems to be an unprovoked DVT and PE.  He tells me he was in his driveway at home.  He is doing all of his activities of daily living without any long stretches of being sedentary.  He became acutely short of breath and was diagnosed with a PE and DVT.  Decision to place him on indefinite anticoagulation was then made.  He has been tolerating anticoagulation well.  Of note prior to his surgical procedure he did have an IVC filter placed with plans to retrieve this soon.  In terms of his dental health he reports that he sees a dentist on a regular basis.  He does have a temporary tooth and is waiting for a permanent tooth to be fashioned.  He is not in need of any dental extractions.  Past Medical History:  Diagnosis Date   Arthritis    neck and back    Chronic neck pain    Depression    ED (erectile dysfunction)    GERD (gastroesophageal reflux disease)    dysphagia   Gout    sees Dr. Leigh Aurora    Hyperlipidemia    Hypertension    OSA (obstructive sleep apnea) 12/18/2017   Severe with AHI at 70.1/hr and is now on CPAP at 11cm H2o   Prostate cancer [facility])    history of   Pulmonary embolism ([place]) 06/2019   Sleep apnea    snoring/never checked  Past Surgical History:  Procedure Laterality Date   AMPUTATION Left 07/18/2018   Procedure: LEFT RING FINGER REVISION AMPUTATION;  Surgeon: Leanora Cover, MD;  Location: [place];  Service: Orthopedics;  Laterality: Left;   CERVICAL FUSION  12/02/2007   per Dr. Sherley Bounds   COLONOSCOPY  03/11/2017   per Dr. Ardis Hughs, clear, no repeats needed    injection lower back Right 09/17/2016   IR IVC FILTER PLMT / S&I /IMG GUID/MOD SED  11/15/2021   IR RADIOLOGIST EVAL & MGMT  10/30/2021   LUMBAR LAMINECTOMY/DECOMPRESSION MICRODISCECTOMY Left 11/21/2021   Procedure: Laminectomy and Foraminotomy - left - Lumbar three-four;  Surgeon: [name], MD;  Location: [place];  Service: Neurosurgery;  Laterality: Left;    PROSTATECTOMY  2000   ROTATOR CUFF REPAIR Right 11/2017   TONSILLECTOMY     as a child   VASECTOMY      Family History  Problem Relation Age of Onset   Cancer Other        breast, porstate   Hypertension Other    Stroke Other    Breast cancer Mother    Diverticulitis Mother    Heart disease Father    Prostate cancer Father    Stroke Father    Dementia Sister    Colon cancer Neg Hx     Social History   Socioeconomic History   Marital status: Divorced    Spouse name: Not on file   Number of children: 2   Years of education: Not on file   Highest education level: Not on file  Occupational History   Occupation: retired  Tobacco Use   Smoking status: Never   Smokeless tobacco: Never  Vaping Use   Vaping Use: Never used  Substance and Sexual Activity   Alcohol use: Yes    Alcohol/week: 2.0 standard drinks    Types: 2 Cans of beer per week   Drug use: Not Currently    Comment: oxycodone is prescribed   Sexual activity: Not on file  Other Topics Concern   Not on file  Social History Narrative   Not on file   Social Determinants of Health   Financial Resource Strain: Low Risk    Difficulty of Paying Living Expenses: Not hard at all  Food Insecurity: No Food Insecurity   Worried About Charity fundraiser in the Last Year: Never true   [place] in the Last Year: Never true  Transportation Needs: No Transportation Needs   Lack of Transportation (Medical): No   Lack of Transportation (Non-Medical): No  Physical Activity: Inactive   Days of Exercise per Week: 0 days   Minutes of Exercise per Session: 0 min  Stress: No Stress Concern Present   Feeling of Stress : Not at all  Social Connections: Not on file  Intimate Partner Violence: Not on file     Prior to Admission medications   Medication Sig Start Date End Date Taking? Authorizing Provider  allopurinol (ZYLOPRIM) 300 MG tablet Take 1 tablet (300 mg total) by mouth daily. 10/19/21   [name], MD   apixaban (ELIQUIS) 5 MG TABS tablet Take 1 tablet (5 mg total) by mouth 2 (two) times daily. 11/01/21   [name], MD  atorvastatin (LIPITOR) 40 MG tablet Take 1 tablet (40 mg total) by mouth daily. 10/19/21   [name], MD  cyclobenzaprine (FLEXERIL) 10 MG tablet TAKE 1 TABLET BY MOUTH 3 TIMES DAILY AS NEEDED FOR MUSCLE  SPASMS 10/22/21   [name], MD  diazepam (VALIUM) 5 MG tablet Take 1 tablet (5 mg total) by mouth every 12 (twelve) hours as needed for anxiety. Patient not taking: Reported on 11/12/2021 07/06/21   [name], MD  famotidine (PEPCID) 20 MG tablet Take 20 mg by mouth daily. After supper    [provider]  gabapentin (NEURONTIN) 100 MG capsule Take 1 capsule (100 mg total) by mouth 4 (four) times daily. 08/23/21   [name], MD  methocarbamol (ROBAXIN) 500 MG tablet Take 1 tablet (500 mg total) by mouth 4 (four) times daily. 11/21/21   Meyran, Ocie Cornfield, NP  oxyCODONE-acetaminophen (PERCOCET) 10-325 MG tablet Take 1 tablet by mouth every 6 (six) hours as needed for pain. 01/04/22 02/03/22  [name], MD  oxyCODONE-acetaminophen (PERCOCET) 5-325 MG tablet Take 1 tablet by mouth every 4 (four) hours as needed for severe pain. 11/21/21 11/21/22  Meyran, Ocie Cornfield, NP  PARoxetine (PAXIL) 20 MG tablet Take 1 tablet (20 mg total) by mouth every morning. 10/19/21   [name], MD  psyllium (METAMUCIL) 58.6 % packet Take 1 packet by mouth daily as needed (for constipation).     [provider]  rivaroxaban (XARELTO) 20 MG TABS tablet Take 1 tablet (20 mg total) by mouth daily with supper. Patient not taking: Reported on 11/12/2021 10/08/21   [name], MD  telmisartan-hydrochlorothiazide (MICARDIS HCT) 80-25 MG tablet TAKE 1 TABLET BY MOUTH EVERY DAY 07/02/21   Tanda Rockers, MD    No Known Allergies  REVIEW OF SYSTEMS:  General: no fevers/chills/night sweats Eyes: no blurry vision, diplopia, or amaurosis ENT: no sore throat or  hearing loss Resp: no cough, wheezing, or hemoptysis CV: no edema or palpitations GI: no abdominal pain, nausea, vomiting, diarrhea, or constipation GU: no dysuria, frequency, or hematuria Skin: no rash Neuro: no headache, numbness, tingling, or weakness of extremities Musculoskeletal: no joint pain or swelling Heme: no bleeding, DVT, or easy bruising Endo: no polydipsia or polyuria  BP 124/80    Pulse 99    Ht 6' (1.829 m)    Wt 238 lb (108 kg)    BMI 32.28 kg/m   PHYSICAL EXAM: GEN:  AO x 3 in no acute distress HEENT: normal Dentition: Normal Neck: JVP normal. +2carotid upstrokes without bruits. No thyromegaly. Lungs: equal expansion, clear bilaterally CV: Apex is discrete and nondisplaced, RRR with 4/6 holosystolic murmur across precordium Abd: soft, non-tender, non-distended; no bruit; positive bowel sounds Ext: no edema, ecchymoses, or cyanosis Vascular: 2+ femoral pulses, 2+ radial pulses       Skin: warm and dry without rash Neuro: CN II-XII grossly intact; motor and sensory grossly intact    DATA AND STUDIES:  EKG: Sinus rhythm with left bundle branch block  2D ECHO: Jan 2023  1. Severe mitral regurgitation.   2. Left ventricular ejection fraction, by estimation, is 55 to 60%. The  left ventricle has normal function. The left ventricle has no regional  wall motion abnormalities. Left ventricular diastolic parameters are  consistent with Grade I diastolic  dysfunction (impaired relaxation).   3. Right ventricular systolic function is normal. The right ventricular  size is normal.   4. Left atrial size was mildly dilated.   5. PISA radius: 1.4 cm      MR ERO: 0.73 cm2      MR Volume: 123 ml. The mitral valve is normal in structure. Severe  mitral valve regurgitation. No  evidence of mitral stenosis. There is  moderate late systolic prolapse of the middle scallop of the posterior  leaflet of the mitral valve.   6. The aortic valve is normal in structure. Aortic  valve regurgitation is  not visualized. No aortic stenosis is present.   7. The inferior vena cava is normal in size with greater than 50%  respiratory variability, suggesting right atrial pressure of 3 mmHg.  CARDIAC CATH: Not yet performed  Lexiscan 2023:   EKG shows no changes during Lexiscan stress   Myoview scan with probable normal perfusion and mild soft tissue attenuation (diaphragm)  No ischemia or scar   LVEF 59%.   Low risk study   No significant change from study from 2018.  STS RISK CALCULATOR:  Isolated MVR: Risk of Mortality: 1.663% Renal Failure: 2.670% Permanent Stroke: 1.126% Prolonged Ventilation: 6.450% DSW Infection: 0.119% Reoperation: 4.844% Morbidity or Mortality: 10.096% Short Length of Stay: 29.180% Long Length of Stay: 5.093%  MVR: Risk of Mortality: 0.626% Renal Failure: 1.132% Permanent Stroke: 1.027% Prolonged Ventilation: 2.956% DSW Infection: 0.062% Reoperation: 2.728% Morbidity or Mortality: 5.943% Short Length of Stay: 47.975% Long Length of Stay: 2.075%  NHYA CLASS: 2-3  ASSESSMENT AND PLAN:   Dyspnea, unspecified type  Nonrheumatic mitral valve regurgitation  Presence of IVC filter  The patient has developed dyspnea that I think is multifactorial with an element of deconditioning, body habitus, and severe degenerative mitral regurgitation all contributing.  We had a long talk about his symptoms and their burden on him.  It is clear that he was symptomatic prior to his surgery and this has been going on for some time.  He is very driven to regain his functional capacity following his back surgery.  I reviewed his echocardiogram which demonstrates a likely degenerative mitral regurgitation with posterior prolapse.  I will refer the patient for TEE, right heart catheterization, left heart catheterization, and coronary angiography study with Dr. Irish Lack as well as a cardiothoracic surgical consultation.  I think he  probably will have anatomy amenable to transcatheter mitral edge-to-edge repair.  If he does not then a surgical approach may be necessary.  Additionally the patient does have an IVC filter in place.  While not impossible it would probably be best to not have to navigate through this in case a mitral edge-to-edge repair were to be pursued.  For this reason I have asked the patient and his wife to reach out to have this retrieved in the coming weeks.  I have personally reviewed the patients imaging data as summarized above.  I have reviewed the natural history of mitral regurgitation with the patient and family members who are present today. We have discussed the limitations of medical therapy and the poor prognosis associated with symptomatic mitral regurgitation. We have also reviewed potential treatment options, including palliative medical therapy, conventional mitral surgery, and transcatheter mitral edge to edge repair. We discussed treatment options in the context of this patient's specific comorbid medical conditions.   All of the patient's questions were answered today. Will make further recommendations based on the results of studies outlined above.   Total time spent with patient today 60 minutes. This includes reviewing records, evaluating the patient and coordinating care.   [name], MD  12/07/2021 11:20 AM    [facility] [address], [place], [place]  [address] Phone: [phone]; Fax: [phone]

## 2021-12-07 NOTE — Telephone Encounter (Signed)
I did the referral 

## 2021-12-08 LAB — BASIC METABOLIC PANEL
BUN/Creatinine Ratio: 19 (ref 10–24)
BUN: 18 mg/dL (ref 8–27)
CO2: 24 mmol/L (ref 20–29)
Calcium: 9.7 mg/dL (ref 8.6–10.2)
Chloride: 98 mmol/L (ref 96–106)
Creatinine, Ser: 0.96 mg/dL (ref 0.76–1.27)
Glucose: 106 mg/dL — ABNORMAL HIGH (ref 70–99)
Potassium: 4.5 mmol/L (ref 3.5–5.2)
Sodium: 136 mmol/L (ref 134–144)
eGFR: 81 mL/min/{1.73_m2} (ref >59)

## 2021-12-08 LAB — CBC
Hematocrit: 37.3 % — ABNORMAL LOW (ref 37.5–51.0)
Hemoglobin: 12.7 g/dL — ABNORMAL LOW (ref 13.0–17.7)
MCH: 31.1 pg (ref 26.6–33.0)
MCHC: 34 g/dL (ref 31.5–35.7)
MCV: 91 fL (ref 79–97)
Platelets: 266 10*3/uL (ref 150–450)
RBC: 4.08 x10E6/uL — ABNORMAL LOW (ref 4.14–5.80)
RDW: 14 % (ref 11.6–15.4)
WBC: 7.5 10*3/uL (ref 3.4–10.8)

## 2021-12-10 ENCOUNTER — Other Ambulatory Visit: Payer: Self-pay

## 2021-12-10 ENCOUNTER — Ambulatory Visit
Admission: RE | Admit: 2021-12-10 | Discharge: 2021-12-10 | Disposition: A | Discharge status: Home / Self Care | Payer: Medicare Other | Source: Ambulatory Visit | Attending: Interventional Radiology | Admitting: Interventional Radiology

## 2021-12-10 DIAGNOSIS — Z95828 Presence of other vascular implants and grafts: Secondary | ICD-10-CM

## 2021-12-10 DIAGNOSIS — I82512 Chronic embolism and thrombosis of left femoral vein: Secondary | ICD-10-CM | POA: Diagnosis not present

## 2021-12-11 ENCOUNTER — Other Ambulatory Visit (HOSPITAL_COMMUNITY): Payer: Self-pay | Admitting: Interventional Radiology

## 2021-12-11 DIAGNOSIS — Z95828 Presence of other vascular implants and grafts: Secondary | ICD-10-CM

## 2021-12-13 ENCOUNTER — Other Ambulatory Visit: Payer: Self-pay | Admitting: Radiology

## 2021-12-13 ENCOUNTER — Other Ambulatory Visit: Payer: Self-pay | Admitting: Student

## 2021-12-14 ENCOUNTER — Other Ambulatory Visit: Payer: Self-pay

## 2021-12-14 ENCOUNTER — Ambulatory Visit (HOSPITAL_COMMUNITY)
Admission: RE | Admit: 2021-12-14 | Discharge: 2021-12-14 | Disposition: A | Discharge status: Home / Self Care | Payer: Medicare Other | Source: Ambulatory Visit | Attending: Interventional Radiology | Admitting: Interventional Radiology

## 2021-12-14 ENCOUNTER — Other Ambulatory Visit (HOSPITAL_COMMUNITY): Payer: Self-pay | Admitting: Physician Assistant

## 2021-12-14 DIAGNOSIS — Z86718 Personal history of other venous thrombosis and embolism: Secondary | ICD-10-CM | POA: Diagnosis not present

## 2021-12-14 DIAGNOSIS — Z86711 Personal history of pulmonary embolism: Secondary | ICD-10-CM | POA: Diagnosis not present

## 2021-12-14 DIAGNOSIS — Z95828 Presence of other vascular implants and grafts: Secondary | ICD-10-CM

## 2021-12-14 DIAGNOSIS — Z4589 Encounter for adjustment and management of other implanted devices: Secondary | ICD-10-CM | POA: Insufficient documentation

## 2021-12-14 HISTORY — PX: IR IVC FILTER RETRIEVAL / S&I /IMG GUID/MOD SED: IMG5308

## 2021-12-14 LAB — BASIC METABOLIC PANEL
Anion gap: 8 (ref 5–15)
BUN: 21 mg/dL (ref 8–23)
CO2: 26 mmol/L (ref 22–32)
Calcium: 9.6 mg/dL (ref 8.9–10.3)
Chloride: 103 mmol/L (ref 98–111)
Creatinine, Ser: 1.24 mg/dL (ref 0.61–1.24)
GFR, Estimated: 60 mL/min — ABNORMAL LOW (ref >60)
Glucose, Bld: 110 mg/dL — ABNORMAL HIGH (ref 70–99)
Potassium: 4.2 mmol/L (ref 3.5–5.1)
Sodium: 137 mmol/L (ref 135–145)

## 2021-12-14 MED ORDER — FENTANYL CITRATE (PF) 100 MCG/2ML IJ SOLN
INTRAMUSCULAR | Status: AC
  Filled 2021-12-14: qty 2

## 2021-12-14 MED ORDER — FENTANYL CITRATE (PF) 100 MCG/2ML IJ SOLN
INTRAMUSCULAR | Status: AC | PRN
  Administered 2021-12-14 (×2): 25 ug via INTRAVENOUS

## 2021-12-14 MED ORDER — LIDOCAINE HCL 1 % IJ SOLN
INTRAMUSCULAR | Status: AC
  Filled 2021-12-14: qty 20

## 2021-12-14 MED ORDER — MIDAZOLAM HCL 2 MG/2ML IJ SOLN
INTRAMUSCULAR | Status: AC
  Filled 2021-12-14: qty 2

## 2021-12-14 MED ORDER — MIDAZOLAM HCL 2 MG/2ML IJ SOLN
INTRAMUSCULAR | Status: AC | PRN
Start: 2021-12-14 — End: 2021-12-14
  Administered 2021-12-14: 1 mg via INTRAVENOUS

## 2021-12-14 MED ORDER — SODIUM CHLORIDE 0.9 % IV SOLN: INTRAVENOUS | Status: DC

## 2021-12-14 MED ORDER — IOHEXOL 300 MG/ML  SOLN
100.0000 mL | Freq: Once | INTRAMUSCULAR | Status: AC | PRN
  Administered 2021-12-14: 25 mL via INTRAVENOUS

## 2021-12-14 NOTE — H&P (Signed)
HPI:  The patient has had a H&P performed within the last 30 days, all history, medications, and exam have been reviewed.   He had his decompressive lumbar hemilaminectomy as expected last month after IVC filter placement.  After his surgery he is becoming more active but he has noted some continued shortness of breath even just walking to his mailbox.  No dyspnea at rest.  He denies chest pain and lower extremity edema.  Ricky Meyers presents today for IVC filter retrieval prior to his cardiac cath scheduled for 2/16.     Medications: Prior to Admission medications   Medication Sig Start Date End Date Taking? Authorizing Provider  allopurinol (ZYLOPRIM) 300 MG tablet Take 1 tablet (300 mg total) by mouth daily. 10/19/21  Yes [name], MD  apixaban (ELIQUIS) 5 MG TABS tablet Take 1 tablet (5 mg total) by mouth 2 (two) times daily. 11/01/21  Yes [name], MD  atorvastatin (LIPITOR) 40 MG tablet Take 1 tablet (40 mg total) by mouth daily. 10/19/21  Yes [name], MD  cyclobenzaprine (FLEXERIL) 10 MG tablet TAKE 1 TABLET BY MOUTH 3 TIMES DAILY AS NEEDED FOR MUSCLE SPASMS 10/22/21  Yes [name], MD  famotidine (PEPCID) 20 MG tablet Take 20 mg by mouth daily after supper.   Yes [provider]  gabapentin (NEURONTIN) 100 MG capsule Take 1 capsule (100 mg total) by mouth 4 (four) times daily. 08/23/21  Yes [name], MD  METAMUCIL FIBER PO Take 1 Dose by mouth 2 (two) times daily. 1 dose = 2 teaspoons   Yes [provider]  oxyCODONE-acetaminophen (PERCOCET) 10-325 MG tablet Take 1 tablet by mouth every 6 (six) hours as needed for pain. 01/04/22 02/03/22 Yes [name], MD  PARoxetine (PAXIL) 20 MG tablet Take 1 tablet (20 mg total) by mouth every morning. 10/19/21  Yes [name], MD  telmisartan-hydrochlorothiazide (MICARDIS HCT) 80-25 MG tablet TAKE 1 TABLET BY MOUTH EVERY DAY 07/02/21  Yes Tanda Rockers, MD  diazepam (VALIUM) 5 MG tablet Take 1  tablet (5 mg total) by mouth every 12 (twelve) hours as needed for anxiety. Patient not taking: Reported on 12/13/2021 07/06/21   [name], MD  rivaroxaban (XARELTO) 20 MG TABS tablet Take 1 tablet (20 mg total) by mouth daily with supper. Patient not taking: Reported on 12/13/2021 10/08/21   [name], MD     Vital Signs: BP 116/76    Pulse 86    Temp 97.6 F (36.4 C) (Oral)    Resp 16    Ht 6' (1.829 m)    Wt 237 lb (107.5 kg)    SpO2 97%    BMI 32.14 kg/m   Physical Exam Constitutional:      Appearance: Normal appearance.  HENT:     Head: Normocephalic.     Nose: Nose normal.     Mouth/Throat:     Mouth: Mucous membranes are moist.     Pharynx: Oropharynx is clear.  Eyes:     Extraocular Movements: Extraocular movements intact.  Cardiovascular:     Rate and Rhythm: Normal rate and regular rhythm.     Heart sounds: Murmur heard.  Pulmonary:     Effort: Pulmonary effort is normal.     Breath sounds: Normal breath sounds.  Abdominal:     General: Abdomen is flat.     Palpations: Abdomen is soft.  Skin:    General: Skin is warm and dry.  Capillary Refill: Capillary refill takes less than 2 seconds.  Neurological:     General: No focal deficit present.     Mental Status: He is alert and oriented to person, place, and time.  Psychiatric:        Mood and Affect: Mood normal.        Behavior: Behavior normal.    Mallampati Score:  Class 1  Labs:  CBC: Recent Labs    08/23/21 0940 11/15/21 0815 12/07/21 1150  WBC 8.3 8.2 7.5  HGB 13.3 13.7 12.7*  HCT 39.1 40.4 37.3*  PLT 178.0 269 266    COAGS: Recent Labs    11/15/21 0815 11/19/21 0848  INR 0.9 0.8    BMP: Recent Labs    08/23/21 0940 11/15/21 0815 12/07/21 1150  NA 138 140 136  K 3.8 4.1 4.5  CL 100 102 98  CO2 29 26 24   GLUCOSE 94 102* 106*  BUN 20 15 18   CALCIUM 9.6 10.0 9.7  CREATININE 1.25 1.03 0.96  GFRNONAA  --  >60  --     LIVER FUNCTION TESTS: Recent Labs     08/23/21 0940  BILITOT 1.1  AST 17  ALT 15  ALKPHOS 58  PROT 6.7  ALBUMIN 4.2    Assessment/Plan:   History of DVT/PE s/p IVC filter placement --filter placed pre surgical procedure, surgery has been completed --Now, for removal, scheduled for heart cath next week --expect d/c this afternoon.    SignedPasty Spillers 12/14/2021, 9:24 AM

## 2021-12-14 NOTE — Procedures (Signed)
Interventional Radiology Procedure Note  Procedure:   US guided access right IJ vein Retrieval of IVC filter. All tines accounted for.    Complications: None Recommendations:  - Ok to restart AC tonight on schedule - advance diet - 1 hr dc home - Do not submerge for 7 days    Signed,  Dulcy Fanny. Earleen Newport, DO

## 2021-12-17 ENCOUNTER — Telehealth: Payer: Self-pay | Admitting: Pharmacist

## 2021-12-17 NOTE — Chronic Care Management (AMB) (Signed)
° ° °  Chronic Care Management Pharmacy Assistant   Name: Ricky Meyers  MRN: [id number] DOB: 1946/08/22  12/19/2021 APPOINTMENT Ricky Meyers was reminded to have all medications, supplements and any blood glucose and blood pressure readings available for review with Jeni Salles, Pharm. D, at his telephone visit on 12/19/2021 at 3:45.   Questions: Have you had any recent office visit or specialist visit outside of [facility]? Patient denies any recent visits outside of [facility].   Are there any concerns you would like to discuss during your office visit? Patient states he is wanting to review a few of his medications with you  Are you having any problems obtaining your medications? (Whether it pharmacy issues or cost) Patient denies any issues getting his medications.   If patient has any PAP medications ask if they are having any problems getting their PAP medication or refill? Patient is getting Eliquis from PAP without any issues.   Care Gaps: AWV - scheduled for 11/18/2022 Last BP - 124/80 on 12/17/2021 Covid-19 vaccine - overdue   Star Rating Drug: Atorvastatin 40mg  - last filled on 10/23/21 90DS at [facility] 80-25mg  - last filled on 10/22/21 90DS at [facility]  Any gaps in medications fill history? No  Gennie Alma [facility]  Catering manager [phone]

## 2021-12-19 ENCOUNTER — Ambulatory Visit (INDEPENDENT_AMBULATORY_CARE_PROVIDER_SITE_OTHER): Payer: Medicare Other | Admitting: Pharmacist

## 2021-12-19 ENCOUNTER — Telehealth: Payer: Self-pay | Admitting: *Deleted

## 2021-12-19 DIAGNOSIS — I10 Essential (primary) hypertension: Secondary | ICD-10-CM

## 2021-12-19 DIAGNOSIS — E782 Mixed hyperlipidemia: Secondary | ICD-10-CM

## 2021-12-19 NOTE — Patient Instructions (Signed)
Hi Clance,  It was great to get catch up with you again!  Please reach out to me if you have any questions or need anything before our follow up!  Best, Maddie  Jeni Salles, PharmD, [facility]   Visit Information   Goals Addressed   None    Patient Care Plan: CCM Pharmacy Care Plan     Problem Identified: Problem: Hypertension, Hyperlipidemia, Depression, Gout, and Pain, History of PE      Long-Range Goal: Patient-Specific Goal   Start Date: 12/19/2021  Expected End Date: 12/19/2022  This Visit's Progress: On track  Priority: High  Note:   Current Barriers:  Unable to independently monitor therapeutic efficacy  Pharmacist Clinical Goal(s):  Patient will achieve adherence to monitoring guidelines and medication adherence to achieve therapeutic efficacy through collaboration with PharmD and provider.   Interventions: 1:1 collaboration with [name], MD regarding development and update of comprehensive plan of care as evidenced by provider attestation and co-signature Inter-disciplinary care team collaboration (see longitudinal plan of care) Comprehensive medication review performed; medication list updated in electronic medical record  Hypertension (BP goal <130/80) -Controlled -Current treatment: Telmisartan-HCTZ 80-25 mg 1 tablet daily - Appropriate, Effective, Safe, Accessible -Medications previously tried: amlodipine (hypotension)  -Current home readings: 124/80, 121/79, 130/82, 165/100, 164/102, 123/79 -Current dietary habits: did not discuss -Current exercise habits: did not discuss -Denies hypotensive/hypertensive symptoms -Educated on BP goals and benefits of medications for prevention of heart attack, stroke and kidney damage; Exercise goal of 150 minutes per week; Importance of home blood pressure monitoring; -Counseled to monitor BP at home weekly, document, and provide log at future  appointments -Recommended to continue current medication  Hyperlipidemia: (LDL goal < 70) -Not ideally controlled -Current treatment: Atorvastatin 40 mg 1 tablet daily - Appropriate, Effective, Safe, Accessible -Medications previously tried: none  -Current dietary patterns: did not discuss -Current exercise habits: did not discuss -Educated on Cholesterol goals;  Benefits of statin for ASCVD risk reduction; -Counseled on diet and exercise extensively Recommended to continue current medication  Depression/Anxiety (Goal: minimize symptoms) -Controlled -Current treatment: Paroxetine 20 mg 1 tablet daily - Appropriate, Effective, Safe, Accessible Diazepam 5 mg 1 tablet prior to MRIs - Appropriate, Effective, Safe, Accessible -Medications previously tried/failed: none -PHQ9: 0 -GAD7: n/a -Educated on Benefits of medication for symptom control Benefits of cognitive-behavioral therapy with or without medication -Recommended to continue current medication  GERD (Goal: minimize symptoms) -Controlled -Current treatment  Famotidine 20 mg 1 tablet daily after supper - Appropriate, Effective, Safe, Accessible -Medications previously tried: none  -Recommended to continue current medication  History of PE (Goal: prevent blood clots) -Controlled -Current treatment  Eliquis 5 mg 1 tablet twice daily - Appropriate, Effective, Safe, Accessible -Medications previously tried: Xarelto (cost/denied by PAP)  -Recommended to continue current medication  Pain (Goal: minimize pain) -Controlled -Current treatment  Cyclobenzaprine 10 mg 1 tablet 3 times daily as needed - Appropriate, Effective, Safe, Accessible Oxycodone-APAP 10-325 mg 1 tablet every 6 hours as needed - Appropriate, Effective, Safe, Accessible -Medications previously tried: none  -Recommended to continue current medication  Gout (Goal: prevent flare ups and uric acid < 6) -Controlled -Current treatment  Allopurinol 300 mg 1  tablet daily - Appropriate, Effective, Safe, Accessible -Medications previously tried: none  -Recommended to continue current medication   Health Maintenance -Vaccine gaps: COVID booster -Current therapy:  None -Educated on Cost vs benefit of each product must be carefully weighed by individual consumer -Patient  is satisfied with current therapy and denies issues -Recommended to continue as is.  Patient Goals/Self-Care Activities Patient will:  - take medications as prescribed as evidenced by patient report and record review check blood pressure weekly, document, and provide at future appointments  Follow Up Plan: Telephone follow up appointment with care management team member scheduled for: 6 months       Patient verbalizes understanding of instructions and care plan provided today and agrees to view in [facility]. Active MyChart status confirmed with patient.   Telephone follow up appointment with pharmacy team member scheduled for:  Viona Gilmore, [facility]

## 2021-12-19 NOTE — Telephone Encounter (Signed)
Cardiac catheterization scheduled at [facility] for: Thursday December 20, 2021 7:30 [facility] Main Entrance A [facility]) at: 5:30 AM   Diet-no solid food after midnight prior to cath, clear liquids until 5 AM day of procedure.  Medication instructions for procedure: -Hold:  Eliquis-last dose AM 12/18/21 until post procedure  Telmisartan-HCTZ-AM of procedure -Except hold medications usual morning medications can be taken pre-cath with sips of water including aspirin 81 mg.    Must have responsible adult to drive home post procedure and be with patient first 24 hours after arriving home.  [facility] does allow one visitor to wait in the waiting room during the time you are there.   Patient reports does not currently have any new symptoms concerning for COVID-19 and no household members with COVID-19 like illness.     Reviewed procedure instructions with patient and patient's wife.

## 2021-12-19 NOTE — Progress Notes (Signed)
Chronic Care Management Pharmacy Note  12/19/2021 Name:  Ricky Meyers MRN:  [id number] DOB:  1946/10/30  Summary: BP is mostly at goal < 130/80 per home readings  Recommendations/Changes made from today's visit: -Recommended continued routine BP monitoring at home  Plan: BP assessment in 3 months   Subjective: Ricky Meyers is an 76 y.o. year old male who is a primary patient of Ricky Morale, MD.  The CCM team was consulted for assistance with disease management and care coordination needs.    Engaged with patient by telephone for follow up visit in response to provider referral for pharmacy case management and/or care coordination services.   Consent to Services:  The patient was given information about Chronic Care Management services, agreed to services, and gave verbal consent prior to initiation of services.  Please see initial visit note for detailed documentation.   Patient Care Team: Ricky Morale, MD as PCP - General Irish Lack Charlann Lange, MD as PCP - Cardiology (Cardiology) Viona Gilmore, [facility] as Pharmacist (Pharmacist)  Recent office visits: 11/12/21 Glenna Durand, LPN: Patient presented for AWV.  11/06/21 Alysia Penna, MD: Patient presented for pain management visit.  10/31/21 Alysia Penna, MD: Patient presented for video visit for COVID infection. Prescribed molnupiravir.  08/23/2021 Alysia Penna MD - Patient was seen for essential hypertension and additional issues. Discontinued Amlodipine. No follow up noted.  08/13/21 Alysia Penna MD (PCP) - seen for osteoarthritis of multiple joints. Referral placed to rheumatology. No medication changes. No follow up noted.    07/30/21 Alysia Penna MD (PCP) - seen for chronic narcotic use.changed hydromorphone to every 8 hours from 4 hours. Start oxycodone-acetaminophen 10-339m every 6 hours as needed in December. No follow up noted.  Recent consult visits: 12/07/21 ALenna Sciara MD (cardiology): Patient presented for  severe mitral regurgitation follow up. Plan for cath.  11/08/21 JLarae Grooms MD (cardiology): Patient presented for MVP follow up.  08/30/2021 KGlenford PeersNP (Neurosurgery) - Patient was seen for lumbar radiculopathy and an additional issue. No medication changes noted. No follow up noted.  07/19/21 KNorville HaggardPhD MD (Neurosurgery) - seen for lumbar radiculopathy and hypertension. No medication changes or follow up noted.   06/05/21 MChristinia GullyMD (Pulmonology) - seen for upper airway cough syndrome. Discontinued doxycycline, famotidine and pantoprazole. Follow up as needed.   04/12/21 CHall BusingMD (Optometry) - seen for punctate keratitis right eye and cataract. No medication changes or follow up noted.   [facility]: [facility] No [facility] changes.   Objective:  Lab Results  Component Value Date   CREATININE 1.24 12/14/2021   BUN 21 12/14/2021   GFR 55.52 (L) 08/23/2021   EGFR 81 12/07/2021   GFRNONAA 60 (L) 12/14/2021   GFRAA >60 06/17/2019   NA 137 12/14/2021   K 4.2 12/14/2021   CALCIUM 9.6 12/14/2021   CO2 26 12/14/2021   GLUCOSE 110 (H) 12/14/2021    Lab Results  Component Value Date/Time   HGBA1C 5.6 08/23/2021 09:40 AM   GFR 55.52 (L) 08/23/2021 09:40 AM   GFR 70.13 07/03/2020 09:54 AM    Last diabetic Eye exam: No results found for: HMDIABEYEEXA  Last diabetic Foot exam: No results found for: HMDIABFOOTEX   Lab Results  Component Value Date   CHOL 153 08/23/2021   HDL 65.20 08/23/2021   LDLCALC 71 08/23/2021   LDLDIRECT 161.4 09/29/2012   TRIG 84.0 08/23/2021   CHOLHDL  2 08/23/2021    Hepatic Function Latest Ref Rng & Units 08/23/2021 08/22/2020 07/15/2018  Total Protein 6.0 - 8.3 g/dL 6.7 7.1 6.5  Albumin 3.5 - 5.2 g/dL 4.2 - 4.1  AST 0 - 37 U/L '17 21 21  ' ALT 0 - 53 U/L '15 23 23  ' Alk Phosphatase 39 - 117 U/L 58 - 61  Total Bilirubin 0.2 - 1.2 mg/dL 1.1  0.8 0.7  Bilirubin, Direct 0.0 - 0.3 mg/dL 0.2 0.2 0.1    Lab Results  Component Value Date/Time   TSH 1.32 08/23/2021 09:40 AM   TSH 1.21 08/22/2020 12:08 PM    CBC Latest Ref Rng & Units 12/07/2021 11/15/2021 08/23/2021  WBC 3.4 - 10.8 x10E3/uL 7.5 8.2 8.3  Hemoglobin 13.0 - 17.7 g/dL 12.7(L) 13.7 13.3  Hematocrit 37.5 - 51.0 % 37.3(L) 40.4 39.1  Platelets 150 - 450 x10E3/uL 266 269 178.0    No results found for: VD25OH  Clinical ASCVD: No  The 10-year ASCVD risk score (Arnett DK, et al., 2019) is: 23.6%   Values used to calculate the score:     Age: 51 years     Sex: Male     Is Non-Hispanic African American: No     Diabetic: No     Tobacco smoker: No     Systolic Blood Pressure: 599 mmHg     Is BP treated: Yes     HDL Cholesterol: 65.2 mg/dL     Total Cholesterol: 153 mg/dL    Depression screen [facility] 2/9 11/12/2021 11/08/2020 08/22/2020  Decreased Interest 0 0 0  Down, Depressed, Hopeless 0 0 0  PHQ - 2 Score 0 0 0  Altered sleeping - - 0  Tired, decreased energy - - 0  Change in appetite - - 0  Feeling bad or failure about yourself  - - 0  Trouble concentrating - - 0  Moving slowly or fidgety/restless - - 0  Suicidal thoughts - - 0  PHQ-9 Score - - 0  Some recent data might be hidden     Social History   Tobacco Use  Smoking Status Never  Smokeless Tobacco Never   BP Readings from Last 3 Encounters:  12/14/21 115/69  12/07/21 124/80  11/21/21 116/85   Pulse Readings from Last 3 Encounters:  12/14/21 78  12/07/21 99  11/21/21 85   Wt Readings from Last 3 Encounters:  12/14/21 237 lb (107.5 kg)  12/07/21 238 lb (108 kg)  11/21/21 237 lb (107.5 kg)   BMI Readings from Last 3 Encounters:  12/14/21 32.14 kg/m  12/07/21 32.28 kg/m  11/21/21 32.14 kg/m    Assessment/Interventions: Review of patient past medical history, allergies, medications, health status, including review of consultants reports, laboratory and other test data, was performed as part  of comprehensive evaluation and provision of chronic care management services.   SDOH:  (Social Determinants of Health) assessments and interventions performed: No  SDOH Screenings   Alcohol Screen: Not on file  Depression (PHQ2-9): Low Risk    PHQ-2 Score: 0  Financial Resource Strain: Low Risk    Difficulty of Paying Living Expenses: Not hard at all  Food Insecurity: No Food Insecurity   Worried About Charity fundraiser in the Last Year: Never true   Ran Out of Food in the Last Year: Never true  Housing: Not on file  Physical Activity: Inactive   Days of Exercise per Week: 0 days   Minutes of Exercise per Session: 0 min  Social  Connections: Not on file  Stress: No Stress Concern Present   Feeling of Stress : Not at all  Tobacco Use: Low Risk    Smoking Tobacco Use: Never   Smokeless Tobacco Use: Never   Passive Exposure: Not on file  Transportation Needs: No Transportation Needs   Lack of Transportation (Medical): No   Lack of Transportation (Non-Medical): No    CCM Care Plan  No Known Allergies  Medications Reviewed Today     Reviewed by Viona Gilmore, [facility] (Pharmacist) on 12/19/21 at 5  Med List Status: <None>   Medication Order Taking? Sig Documenting Provider Last Dose Status Informant  allopurinol (ZYLOPRIM) 300 MG tablet [id number]  Take 1 tablet (300 mg total) by mouth daily. Ricky Morale, MD  Active Spouse/Significant Other  apixaban (ELIQUIS) 5 MG TABS tablet [id number] Yes Take 1 tablet (5 mg total) by mouth 2 (two) times daily. Ricky Morale, MD Taking Active Spouse/Significant Other  atorvastatin (LIPITOR) 40 MG tablet [id number]  Take 1 tablet (40 mg total) by mouth daily. Ricky Morale, MD  Active Spouse/Significant Other  cyclobenzaprine (FLEXERIL) 10 MG tablet [id number]  TAKE 1 TABLET BY MOUTH 3 TIMES DAILY AS NEEDED FOR MUSCLE SPASMS Ricky Morale, MD  Active Spouse/Significant Other  diazepam (VALIUM) 5 MG tablet [id number]  Take 1 tablet (5 mg  total) by mouth every 12 (twelve) hours as needed for anxiety.  Patient not taking: Reported on 12/13/2021   Ricky Morale, MD  Active Spouse/Significant Other  famotidine (PEPCID) 20 MG tablet [id number] Yes Take 20 mg by mouth daily after supper. [provider] Taking Active Spouse/Significant Other  gabapentin (NEURONTIN) 100 MG capsule [id number]  Take 1 capsule (100 mg total) by mouth 4 (four) times daily. Ricky Morale, MD  Active Spouse/Significant Other  METAMUCIL FIBER PO [id number]  Take 1 Dose by mouth 2 (two) times daily. 1 dose = 2 teaspoons [provider]  Active Spouse/Significant Other  oxyCODONE-acetaminophen (PERCOCET) 10-325 MG tablet [id number]  Take 1 tablet by mouth every 6 (six) hours as needed for pain. Ricky Morale, MD  Active Spouse/Significant Other  PARoxetine (PAXIL) 20 MG tablet [id number] Yes Take 1 tablet (20 mg total) by mouth every morning. Ricky Morale, MD Taking Active Spouse/Significant Other  telmisartan-hydrochlorothiazide (MICARDIS HCT) 80-25 MG tablet [id number] Yes TAKE 1 TABLET BY MOUTH EVERY DAY Tanda Rockers, MD Taking Active Spouse/Significant Other            Patient Active Problem List   Diagnosis Date Noted   S/P lumbar laminectomy 11/21/2021   COVID-19 virus infection 10/31/2021   Upper airway cough syndrome 12/04/2020   Pulmonary embolus ([place]) 06/15/2019   Pulmonary embolism ([place]) 06/15/2019   Osteoarthritis 10/14/2018   OSA (obstructive sleep apnea) 12/18/2017   Obesity (BMI 30.0-34.9) 06/17/2017   Snoring 06/17/2017   LBBB (left bundle branch block) 10/22/2016   DOE (dyspnea on exertion) 10/22/2016   Ectopic beat, ventricular 10/22/2016   Anemia, iron deficiency 12/03/2014   Pseudogout of elbow 12/02/2014   Hyponatremia 12/02/2014   Constipation 12/02/2014   Suspected Septic arthritis 12/02/2014   Depression 03/04/2008   Hyperlipemia 06/30/2007   Gout 06/30/2007   Essential hypertension 06/30/2007   NECK  PAIN 06/30/2007   PROSTATE CANCER, HX OF 06/30/2007    Immunization History  Administered Date(s) Administered   Fluad Quad(high Dose 65+) 08/22/2020, 07/30/2021   Influenza Split 09/05/2011, 11/09/2012   Influenza Whole 10/04/2009, 09/24/2010   Influenza, High Dose  Seasonal PF 07/17/2015, 09/01/2017, 07/15/2018, 07/28/2019   Influenza,inj,Quad PF,6+ Mos 11/24/2013, 11/16/2014   Influenza-Unspecified 07/28/2019   PFIZER(Purple Top)SARS-COV-2 Vaccination 11/19/2019, 12/10/2019, 09/26/2020   Pneumococcal Conjugate-13 07/17/2015   Pneumococcal Polysaccharide-23 10/04/2009   Pneumococcal-Unspecified 09/03/2017   Td 03/10/2009   Tdap 07/18/2018   Zoster Recombinat (Shingrix) 06/05/2021, 08/21/2021   Zoster, Live 09/05/2011    Conditions to be addressed/monitored:  Hypertension, Hyperlipidemia, Depression, Gout, and Pain, History of PE  Care Plan : [place]  Updates made by Viona Gilmore, [place] since 12/19/2021 12:00 AM     Problem: Problem: Hypertension, Hyperlipidemia, Depression, Gout, and Pain, History of PE      Long-Range Goal: Patient-Specific Goal   Start Date: 12/19/2021  Expected End Date: 12/19/2022  This Visit's Progress: On track  Priority: High  Note:   Current Barriers:  Unable to independently monitor therapeutic efficacy  Pharmacist Clinical Goal(s):  Patient will achieve adherence to monitoring guidelines and medication adherence to achieve therapeutic efficacy through collaboration with PharmD and provider.   Interventions: 1:1 collaboration with Ricky Morale, MD regarding development and update of comprehensive plan of care as evidenced by provider attestation and co-signature Inter-disciplinary care team collaboration (see longitudinal plan of care) Comprehensive medication review performed; medication list updated in electronic medical record  Hypertension (BP goal <130/80) -Controlled -Current treatment: Telmisartan-HCTZ 80-25 mg 1  tablet daily - Appropriate, Effective, Safe, Accessible -Medications previously tried: amlodipine (hypotension)  -Current home readings: 124/80, 121/79, 130/82, 165/100, 164/102, 123/79 -Current dietary habits: did not discuss -Current exercise habits: did not discuss -Denies hypotensive/hypertensive symptoms -Educated on BP goals and benefits of medications for prevention of heart attack, stroke and kidney damage; Exercise goal of 150 minutes per week; Importance of home blood pressure monitoring; -Counseled to monitor BP at home weekly, document, and provide log at future appointments -Recommended to continue current medication  Hyperlipidemia: (LDL goal < 70) -Not ideally controlled -Current treatment: Atorvastatin 40 mg 1 tablet daily - Appropriate, Effective, Safe, Accessible -Medications previously tried: none  -Current dietary patterns: did not discuss -Current exercise habits: did not discuss -Educated on Cholesterol goals;  Benefits of statin for ASCVD risk reduction; -Counseled on diet and exercise extensively Recommended to continue current medication  Depression/Anxiety (Goal: minimize symptoms) -Controlled -Current treatment: Paroxetine 20 mg 1 tablet daily - Appropriate, Effective, Safe, Accessible Diazepam 5 mg 1 tablet prior to MRIs - Appropriate, Effective, Safe, Accessible -Medications previously tried/failed: none -PHQ9: 0 -GAD7: n/a -Educated on Benefits of medication for symptom control Benefits of cognitive-behavioral therapy with or without medication -Recommended to continue current medication  GERD (Goal: minimize symptoms) -Controlled -Current treatment  Famotidine 20 mg 1 tablet daily after supper - Appropriate, Effective, Safe, Accessible -Medications previously tried: none  -Recommended to continue current medication  History of PE (Goal: prevent blood clots) -Controlled -Current treatment  Eliquis 5 mg 1 tablet twice daily - Appropriate,  Effective, Safe, Accessible -Medications previously tried: Xarelto (cost/denied by PAP)  -Recommended to continue current medication  Pain (Goal: minimize pain) -Controlled -Current treatment  Cyclobenzaprine 10 mg 1 tablet 3 times daily as needed - Appropriate, Effective, Safe, Accessible Oxycodone-APAP 10-325 mg 1 tablet every 6 hours as needed - Appropriate, Effective, Safe, Accessible -Medications previously tried: none  -Recommended to continue current medication  Gout (Goal: prevent flare ups and uric acid < 6) -Controlled -Current treatment  Allopurinol 300 mg 1 tablet daily - Appropriate, Effective, Safe, Accessible -Medications previously tried: none  -Recommended to continue current medication  Health Maintenance -Vaccine gaps: COVID booster -Current therapy:  None -Educated on Cost vs benefit of each product must be carefully weighed by individual consumer -Patient is satisfied with current therapy and denies issues -Recommended to continue as is.  Patient Goals/Meyers-Care Activities Patient will:  - take medications as prescribed as evidenced by patient report and record review check blood pressure weekly, document, and provide at future appointments  Follow Up Plan: Telephone follow up appointment with care management team member scheduled for: 6 months      Medication Assistance:  Eliquis obtained through [employer] medication assistance program.  Enrollment ends 11/03/22  Compliance/Adherence/Medication fill history: Care Gaps: COVID booster Last BP - 124/80 on 12/17/2021  Star-Rating Drugs: Atorvastatin 65m - last [place] [facility]23/[facility] [id number]- [facility] 10[facility]22/21 [facility] [place]:  [facility] [place] [address] [place] [id number]: [phone]: [address] [place]one: [phone]: [phone] pill box? Yes Pt endorses 99% compliance  We discussed: Current pharmacy is preferred with insurance plan and patient is satisfied with pharmacy services Patient decided to: Continue current medication management strategy  Care Plan and Follow Up Patient Decision:  Patient agrees to Care Plan and Follow-up.  Plan: Telephone follow up appointment with care management team member scheduled for:  6 months  MaJeni SallesPharmD, BCToom[place]at [phone]

## 2021-12-20 ENCOUNTER — Encounter (HOSPITAL_COMMUNITY)
Admission: RE | Disposition: A | Discharge status: Home / Self Care | Payer: Self-pay | Source: Home / Self Care | Attending: Interventional Cardiology

## 2021-12-20 ENCOUNTER — Other Ambulatory Visit: Payer: Self-pay

## 2021-12-20 ENCOUNTER — Ambulatory Visit (HOSPITAL_COMMUNITY)
Admission: RE | Admit: 2021-12-20 | Discharge: 2021-12-20 | Disposition: A | Discharge status: Home / Self Care | Payer: Medicare Other | Attending: Interventional Cardiology | Admitting: Interventional Cardiology

## 2021-12-20 DIAGNOSIS — Z86718 Personal history of other venous thrombosis and embolism: Secondary | ICD-10-CM | POA: Diagnosis not present

## 2021-12-20 DIAGNOSIS — I2510 Atherosclerotic heart disease of native coronary artery without angina pectoris: Secondary | ICD-10-CM | POA: Insufficient documentation

## 2021-12-20 DIAGNOSIS — Z95828 Presence of other vascular implants and grafts: Secondary | ICD-10-CM | POA: Insufficient documentation

## 2021-12-20 DIAGNOSIS — I340 Nonrheumatic mitral (valve) insufficiency: Secondary | ICD-10-CM

## 2021-12-20 DIAGNOSIS — Z86711 Personal history of pulmonary embolism: Secondary | ICD-10-CM | POA: Diagnosis not present

## 2021-12-20 DIAGNOSIS — E785 Hyperlipidemia, unspecified: Secondary | ICD-10-CM | POA: Diagnosis not present

## 2021-12-20 DIAGNOSIS — I10 Essential (primary) hypertension: Secondary | ICD-10-CM | POA: Diagnosis not present

## 2021-12-20 HISTORY — PX: RIGHT/LEFT HEART CATH AND CORONARY ANGIOGRAPHY: CATH118266

## 2021-12-20 LAB — POCT I-STAT 7, (LYTES, BLD GAS, ICA,H+H)
Acid-Base Excess: 0 mmol/L (ref 0.0–2.0)
Bicarbonate: 25 mmol/L (ref 20.0–28.0)
Calcium, Ion: 1.29 mmol/L (ref 1.15–1.40)
HCT: 36 % — ABNORMAL LOW (ref 39.0–52.0)
Hemoglobin: 12.2 g/dL — ABNORMAL LOW (ref 13.0–17.0)
O2 Saturation: 95 %
Potassium: 4.1 mmol/L (ref 3.5–5.1)
Sodium: 141 mmol/L (ref 135–145)
TCO2: 26 mmol/L (ref 22–32)
pCO2 arterial: 40.8 mmHg (ref 32–48)
pH, Arterial: 7.394 (ref 7.35–7.45)
pO2, Arterial: 76 mmHg — ABNORMAL LOW (ref 83–108)

## 2021-12-20 LAB — POCT I-STAT EG7
Acid-Base Excess: 1 mmol/L (ref 0.0–2.0)
Acid-Base Excess: 1 mmol/L (ref 0.0–2.0)
Bicarbonate: 26.8 mmol/L (ref 20.0–28.0)
Bicarbonate: 26.9 mmol/L (ref 20.0–28.0)
Calcium, Ion: 1.31 mmol/L (ref 1.15–1.40)
Calcium, Ion: 1.31 mmol/L (ref 1.15–1.40)
HCT: 36 % — ABNORMAL LOW (ref 39.0–52.0)
HCT: 37 % — ABNORMAL LOW (ref 39.0–52.0)
Hemoglobin: 12.2 g/dL — ABNORMAL LOW (ref 13.0–17.0)
Hemoglobin: 12.6 g/dL — ABNORMAL LOW (ref 13.0–17.0)
O2 Saturation: 67 %
O2 Saturation: 71 %
Potassium: 4 mmol/L (ref 3.5–5.1)
Potassium: 4.1 mmol/L (ref 3.5–5.1)
Sodium: 140 mmol/L (ref 135–145)
Sodium: 141 mmol/L (ref 135–145)
TCO2: 28 mmol/L (ref 22–32)
TCO2: 28 mmol/L (ref 22–32)
pCO2, Ven: 45.7 mmHg (ref 44–60)
pCO2, Ven: 46.3 mmHg (ref 44–60)
pH, Ven: 7.371 (ref 7.25–7.43)
pH, Ven: 7.378 (ref 7.25–7.43)
pO2, Ven: 36 mmHg (ref 32–45)
pO2, Ven: 38 mmHg (ref 32–45)

## 2021-12-20 SURGERY — RIGHT/LEFT HEART CATH AND CORONARY ANGIOGRAPHY
Anesthesia: LOCAL

## 2021-12-20 MED ORDER — SODIUM CHLORIDE 0.9 % IV SOLN: 250.0000 mL | INTRAVENOUS | Status: DC | PRN

## 2021-12-20 MED ORDER — SODIUM CHLORIDE 0.9 % WEIGHT BASED INFUSION
3.0000 mL/kg/h | INTRAVENOUS | Status: AC
  Administered 2021-12-20: 3 mL/kg/h via INTRAVENOUS

## 2021-12-20 MED ORDER — MIDAZOLAM HCL 2 MG/2ML IJ SOLN
INTRAMUSCULAR | Status: DC | PRN
  Administered 2021-12-20 (×2): 1 mg via INTRAVENOUS

## 2021-12-20 MED ORDER — FENTANYL CITRATE (PF) 100 MCG/2ML IJ SOLN
INTRAMUSCULAR | Status: DC | PRN
  Administered 2021-12-20 (×2): 25 ug via INTRAVENOUS

## 2021-12-20 MED ORDER — HEPARIN (PORCINE) IN NACL 1000-0.9 UT/500ML-% IV SOLN
INTRAVENOUS | Status: AC
  Filled 2021-12-20: qty 500

## 2021-12-20 MED ORDER — VERAPAMIL HCL 2.5 MG/ML IV SOLN
INTRAVENOUS | Status: DC | PRN
  Administered 2021-12-20 (×2): 10 mL via INTRA_ARTERIAL

## 2021-12-20 MED ORDER — ASPIRIN 81 MG PO CHEW: 81.0000 mg | CHEWABLE_TABLET | ORAL | Status: AC

## 2021-12-20 MED ORDER — SODIUM CHLORIDE 0.9 % WEIGHT BASED INFUSION: 1.0000 mL/kg/h | INTRAVENOUS | Status: DC

## 2021-12-20 MED ORDER — SODIUM CHLORIDE 0.9 % IV SOLN: INTRAVENOUS | Status: DC

## 2021-12-20 MED ORDER — SODIUM CHLORIDE 0.9% FLUSH: 3.0000 mL | Freq: Two times a day (BID) | INTRAVENOUS | Status: DC

## 2021-12-20 MED ORDER — MIDAZOLAM HCL 2 MG/2ML IJ SOLN
INTRAMUSCULAR | Status: AC
  Filled 2021-12-20: qty 2

## 2021-12-20 MED ORDER — SODIUM CHLORIDE 0.9% FLUSH: 3.0000 mL | INTRAVENOUS | Status: DC | PRN

## 2021-12-20 MED ORDER — ONDANSETRON HCL 4 MG/2ML IJ SOLN: 4.0000 mg | Freq: Four times a day (QID) | INTRAMUSCULAR | Status: DC | PRN

## 2021-12-20 MED ORDER — LIDOCAINE HCL (PF) 1 % IJ SOLN
INTRAMUSCULAR | Status: AC
  Filled 2021-12-20: qty 30

## 2021-12-20 MED ORDER — HYDRALAZINE HCL 20 MG/ML IJ SOLN: 10.0000 mg | INTRAMUSCULAR | Status: DC | PRN

## 2021-12-20 MED ORDER — HEPARIN SODIUM (PORCINE) 1000 UNIT/ML IJ SOLN
INTRAMUSCULAR | Status: DC | PRN
  Administered 2021-12-20: 5000 [IU] via INTRAVENOUS

## 2021-12-20 MED ORDER — FENTANYL CITRATE (PF) 100 MCG/2ML IJ SOLN
INTRAMUSCULAR | Status: AC
  Filled 2021-12-20: qty 2

## 2021-12-20 MED ORDER — ACETAMINOPHEN 325 MG PO TABS: 650.0000 mg | ORAL_TABLET | ORAL | Status: DC | PRN

## 2021-12-20 MED ORDER — LIDOCAINE HCL (PF) 1 % IJ SOLN
INTRAMUSCULAR | Status: DC | PRN
  Administered 2021-12-20: 5 mL

## 2021-12-20 MED ORDER — HEPARIN (PORCINE) IN NACL 1000-0.9 UT/500ML-% IV SOLN
INTRAVENOUS | Status: DC | PRN
  Administered 2021-12-20 (×2): 500 mL

## 2021-12-20 MED ORDER — IOHEXOL 350 MG/ML SOLN
INTRAVENOUS | Status: DC | PRN
  Administered 2021-12-20: 65 mL via INTRA_ARTERIAL

## 2021-12-20 MED ORDER — VERAPAMIL HCL 2.5 MG/ML IV SOLN
INTRAVENOUS | Status: AC
  Filled 2021-12-20: qty 2

## 2021-12-20 MED ORDER — HEPARIN SODIUM (PORCINE) 1000 UNIT/ML IJ SOLN
INTRAMUSCULAR | Status: AC
  Filled 2021-12-20: qty 10

## 2021-12-20 MED ORDER — APIXABAN 5 MG PO TABS: 5.0000 mg | ORAL_TABLET | Freq: Two times a day (BID) | ORAL | 3 refills | Status: DC

## 2021-12-20 MED ORDER — LABETALOL HCL 5 MG/ML IV SOLN: 10.0000 mg | INTRAVENOUS | Status: DC | PRN

## 2021-12-20 SURGICAL SUPPLY — 15 items
CATH 5FR JL3.5 JR4 ANG PIG MP (CATHETERS) ×1 IMPLANT
CATH BALLN WEDGE 5F 110CM (CATHETERS) ×1 IMPLANT
DEVICE RAD COMP TR BAND LRG (VASCULAR PRODUCTS) ×1 IMPLANT
GLIDESHEATH SLEND SS 6F .021 (SHEATH) ×1 IMPLANT
GUIDEWIRE INQWIRE 1.5J.035X260 (WIRE) IMPLANT
INQWIRE 1.5J .035X260CM (WIRE) ×2
KIT HEART LEFT (KITS) ×2 IMPLANT
PACK CARDIAC CATHETERIZATION (CUSTOM PROCEDURE TRAY) ×2 IMPLANT
SHEATH 6FR 85 DEST SLENDER (SHEATH) ×1 IMPLANT
SHEATH GLIDE SLENDER 4/5FR (SHEATH) ×1 IMPLANT
SHEATH PROBE COVER 6X72 (BAG) ×1 IMPLANT
TRANSDUCER W/STOPCOCK (MISCELLANEOUS) ×2 IMPLANT
TUBING CIL FLEX 10 FLL-RA (TUBING) ×2 IMPLANT
WIRE EMERALD 3MM-J .025X260CM (WIRE) ×1 IMPLANT
WIRE HI TORQ VERSACORE-J 145CM (WIRE) ×1 IMPLANT

## 2021-12-20 NOTE — Interval H&P Note (Signed)
Cath Lab Visit (complete for each Cath Lab visit)  Clinical Evaluation Leading to the Procedure:   ACS: No.  Non-ACS:    Anginal Classification: CCS III  Anti-ischemic medical therapy: Maximal Therapy (2 or more classes of medications)  Non-Invasive Test Results: High-risk stress test findings: cardiac mortality >3%/year  Prior CABG: No previous CABG  Significant mitral regurgitation, possible mitraclip    History and Physical Interval Note:  12/20/2021 7:41 AM  Ricky Meyers  has presented today for surgery, with the diagnosis of mr.  The various methods of treatment have been discussed with the patient and family. After consideration of risks, benefits and other options for treatment, the patient has consented to  Procedure(s): RIGHT/LEFT HEART CATH AND CORONARY ANGIOGRAPHY (N/A) as a surgical intervention.  [name] history has been reviewed, patient examined, no change in status, stable for surgery.  I have reviewed the patient's chart and labs.  Questions were answered to the patient's satisfaction.     Larae Grooms

## 2021-12-21 ENCOUNTER — Encounter (HOSPITAL_COMMUNITY): Payer: Self-pay | Admitting: Interventional Cardiology

## 2021-12-24 ENCOUNTER — Encounter (HOSPITAL_COMMUNITY): Payer: Self-pay | Admitting: Cardiovascular Disease

## 2021-12-24 NOTE — Progress Notes (Signed)
Attempted to obtain medical history via telephone, unable to reach at this time. I left a voicemail to return pre surgical testing department's phone call.  

## 2021-12-27 DIAGNOSIS — I2510 Atherosclerotic heart disease of native coronary artery without angina pectoris: Secondary | ICD-10-CM | POA: Insufficient documentation

## 2021-12-27 NOTE — Progress Notes (Signed)
Cardiology Office Note:    Date:  12/28/2021   ID:  Meyers, Ricky 10-Jun-1946, MRN [id number]  PCP:  [name], MD  [facility] Providers Cardiologist:  Larae Grooms, MD Structural Heart:  [name], MD    Referring MD: [name], MD   Chief Complaint:  Hospitalization Follow-up (S/p cardiac catheterization)    Patient Profile: Severe Mitral Regurgitation Mild non-obstructive coronary artery disease  Cath 12/2021 Hx of idiopathic DVT/pulmonary embolism  Lifelong anticoagulation  S/p retrievable IVC filter in 11/2021 prior to back surgery Hypertension  Hyperlipidemia  GERD  OSA  Prostate CA Gout   Prior CV Studies: Right and left cardiac catheterization 12/20/2021 D1 25 Aortic saturation 95%, PA saturation 69%,PA pressure 35/12, mean PA pressure 22 mmHg, mean PCWP 10 mmHg; CO 6.8, CI 2.96  Echocardiogram 11/13/2021 Severe mitral regurgitation, moderate late systolic prolapse of middle scallop of posterior leaflet,EF 55-60, no RWMA, GR 1 DD, normal RVSF, mild LAE  Myoview 11/13/2021 EF 59, normal fusion; low risk   History of Present Illness:   Ricky Meyers is a 76 y.o. male with the above problem list.  He was evaluated by Dr. Ali Lowe on 12/07/21 for severe MR.  R/L cardiac catheterization was arranged.  This was performed on 12/20/21 and demonstrated mild non-obstructive dz.  He has also been set up for a TEE on 01/01/22.  He returns for follow-up.  He has been doing well since his cardiac catheterization.  He has not had chest pain, syncope, orthopnea, leg edema.  He has shortness of breath with mild to moderate activities.  This is overall stable.      Past Medical History:  Diagnosis Date   Arthritis    neck and back    Chronic neck pain    Depression    ED (erectile dysfunction)    GERD (gastroesophageal reflux disease)    dysphagia   Gout    sees Dr. Leigh Aurora    Hyperlipidemia    Hypertension    OSA (obstructive sleep apnea)  12/18/2017   Severe with AHI at 70.1/hr and is now on CPAP at 11cm H2o   Prostate cancer [facility])    history of   Pulmonary embolism ([place]) 06/2019   Sleep apnea    snoring/never checked   Current Medications: Current Meds  Medication Sig   allopurinol (ZYLOPRIM) 300 MG tablet Take 1 tablet (300 mg total) by mouth daily.   apixaban (ELIQUIS) 5 MG TABS tablet Take 1 tablet (5 mg total) by mouth 2 (two) times daily.   atorvastatin (LIPITOR) 40 MG tablet Take 1 tablet (40 mg total) by mouth daily.   cyclobenzaprine (FLEXERIL) 10 MG tablet TAKE 1 TABLET BY MOUTH 3 TIMES DAILY AS NEEDED FOR MUSCLE SPASMS   famotidine (PEPCID) 20 MG tablet Take 20 mg by mouth daily after supper.   gabapentin (NEURONTIN) 100 MG capsule Take 1 capsule (100 mg total) by mouth 4 (four) times daily.   METAMUCIL FIBER PO Take 1 Dose by mouth 2 (two) times daily. 1 dose = 2 teaspoons   [START ON 01/04/2022] oxyCODONE-acetaminophen (PERCOCET) 10-325 MG tablet Take 1 tablet by mouth every 6 (six) hours as needed for pain.   PARoxetine (PAXIL) 20 MG tablet Take 1 tablet (20 mg total) by mouth every morning.   telmisartan-hydrochlorothiazide (MICARDIS HCT) 80-25 MG tablet TAKE 1 TABLET BY MOUTH EVERY DAY    Allergies:   Patient has no known allergies.   Social History  Tobacco Use   Smoking status: Never   Smokeless tobacco: Never  Vaping Use   Vaping Use: Never used  Substance Use Topics   Alcohol use: Yes    Alcohol/week: 2.0 standard drinks    Types: 2 Cans of beer per week   Drug use: Not Currently    Comment: oxycodone is prescribed    Family Hx: [name] family history includes Breast cancer in his mother; Cancer in an other family member; Dementia in his sister; Diverticulitis in his mother; Heart disease in his father; Hypertension in an other family member; Prostate cancer in his father; Stroke in his father and another family member. There is no history of Colon cancer.  ROS see  HPI  EKGs/Labs/Other Test Reviewed:    EKG:  EKG is not ordered today.  The ekg ordered today demonstrates n/a  Recent Labs: 08/23/2021: ALT 15; TSH 1.32 12/07/2021: Platelets 266 12/14/2021: BUN 21; Creatinine, Ser 1.24 12/20/2021: Hemoglobin 12.2; Potassium 4.0; Sodium 140   Recent Lipid Panel Recent Labs    08/23/21 0940  CHOL 153  TRIG 84.0  HDL 65.20  VLDL 16.8  LDLCALC 71     Risk Assessment/Calculations:         Physical Exam:    VS:  BP 122/62 (BP Location: Right Arm, Patient Position: Sitting, Cuff Size: Normal)    Pulse 78    Ht 6' (1.829 m)    Wt 239 lb (108.4 kg)    SpO2 98%    BMI 32.41 kg/m     Wt Readings from Last 3 Encounters:  12/28/21 239 lb (108.4 kg)  12/20/21 237 lb (107.5 kg)  12/14/21 237 lb (107.5 kg)    Constitutional:      Appearance: Healthy appearance. Not in distress.  Neck:     Vascular: JVD normal.  Pulmonary:     Effort: Pulmonary effort is normal.     Breath sounds: No wheezing. No rales.  Cardiovascular:     Normal rate. Regular rhythm. Normal S1. Normal S2.      Murmurs: There is a grade 3/6 holosystolic murmur at the LLSB.     Comments: Right wrist without hematoma Edema:    Peripheral edema absent.  Abdominal:     Palpations: Abdomen is soft.  Skin:    General: Skin is warm and dry.  Neurological:     General: No focal deficit present.     Mental Status: Alert and oriented to person, place and time.     Cranial Nerves: Cranial nerves are intact.         ASSESSMENT & PLAN:   Severe mitral regurgitation He has symptomatic mitral regurgitation and is currently going through evaluation for MitraClip.  TEE is scheduled for next week.   Coronary artery disease involving native coronary artery of native heart without angina pectoris Minimal nonobstructive disease by cardiac catheterization.  He is not on antiplatelet therapy as he remains on chronic anticoagulation with Apixaban.  He remains on atorvastatin.  He is not having  anginal symptoms.  Essential hypertension Blood pressure is well controlled.  History of pulmonary embolism He remains on chronic anticoagulation with Apixaban.  He has undergone removal of his IVC filter.           Dispo:  Return for follow up with Dr. Hillery Hunter pending TEE results. .   Medication Adjustments/Labs and Tests Ordered: Current medicines are reviewed at length with the patient today.  Concerns regarding medicines are outlined above.  Tests Ordered:  No orders of the defined types were placed in this encounter.  Medication Changes: No orders of the defined types were placed in this encounter.  Signed, Richardson Dopp, PA-C  12/28/2021 8:55 AM    [facility] [place], [place],   [address] Phone: [phone]; Fax: [phone]

## 2021-12-27 NOTE — H&P (View-Only) (Signed)
Cardiology Office Note:    Date:  12/28/2021   ID:  Ricky, Meyers 1945/12/28, MRN [id number]  PCP:  [name], MD  [facility] Providers Cardiologist:  Larae Grooms, MD Structural Heart:  [name], MD    Referring MD: [name], MD   Chief Complaint:  Hospitalization Follow-up (S/p cardiac catheterization)    Patient Profile: Severe Mitral Regurgitation Mild non-obstructive coronary artery disease  Cath 12/2021 Hx of idiopathic DVT/pulmonary embolism  Lifelong anticoagulation  S/p retrievable IVC filter in 11/2021 prior to back surgery Hypertension  Hyperlipidemia  GERD  OSA  Prostate CA Gout   Prior CV Studies: Right and left cardiac catheterization 12/20/2021 D1 25 Aortic saturation 95%, PA saturation 69%,PA pressure 35/12, mean PA pressure 22 mmHg, mean PCWP 10 mmHg; CO 6.8, CI 2.96  Echocardiogram 11/13/2021 Severe mitral regurgitation, moderate late systolic prolapse of middle scallop of posterior leaflet,EF 55-60, no RWMA, GR 1 DD, normal RVSF, mild LAE  Myoview 11/13/2021 EF 59, normal fusion; low risk   History of Present Illness:   Ricky Meyers is a 76 y.o. male with the above problem list.  He was evaluated by Dr. Ali Lowe on 12/07/21 for severe MR.  R/L cardiac catheterization was arranged.  This was performed on 12/20/21 and demonstrated mild non-obstructive dz.  He has also been set up for a TEE on 01/01/22.  He returns for follow-up.  He has been doing well since his cardiac catheterization.  He has not had chest pain, syncope, orthopnea, leg edema.  He has shortness of breath with mild to moderate activities.  This is overall stable.      Past Medical History:  Diagnosis Date   Arthritis    neck and back    Chronic neck pain    Depression    ED (erectile dysfunction)    GERD (gastroesophageal reflux disease)    dysphagia   Gout    sees Dr. Leigh Aurora    Hyperlipidemia    Hypertension    OSA (obstructive sleep apnea)  12/18/2017   Severe with AHI at 70.1/hr and is now on CPAP at 11cm H2o   Prostate cancer [facility])    history of   Pulmonary embolism ([place]) 06/2019   Sleep apnea    snoring/never checked   Current Medications: Current Meds  Medication Sig   allopurinol (ZYLOPRIM) 300 MG tablet Take 1 tablet (300 mg total) by mouth daily.   apixaban (ELIQUIS) 5 MG TABS tablet Take 1 tablet (5 mg total) by mouth 2 (two) times daily.   atorvastatin (LIPITOR) 40 MG tablet Take 1 tablet (40 mg total) by mouth daily.   cyclobenzaprine (FLEXERIL) 10 MG tablet TAKE 1 TABLET BY MOUTH 3 TIMES DAILY AS NEEDED FOR MUSCLE SPASMS   famotidine (PEPCID) 20 MG tablet Take 20 mg by mouth daily after supper.   gabapentin (NEURONTIN) 100 MG capsule Take 1 capsule (100 mg total) by mouth 4 (four) times daily.   METAMUCIL FIBER PO Take 1 Dose by mouth 2 (two) times daily. 1 dose = 2 teaspoons   [START ON 01/04/2022] oxyCODONE-acetaminophen (PERCOCET) 10-325 MG tablet Take 1 tablet by mouth every 6 (six) hours as needed for pain.   PARoxetine (PAXIL) 20 MG tablet Take 1 tablet (20 mg total) by mouth every morning.   telmisartan-hydrochlorothiazide (MICARDIS HCT) 80-25 MG tablet TAKE 1 TABLET BY MOUTH EVERY DAY    Allergies:   Patient has no known allergies.   Social History  Tobacco Use   Smoking status: Never   Smokeless tobacco: Never  Vaping Use   Vaping Use: Never used  Substance Use Topics   Alcohol use: Yes    Alcohol/week: 2.0 standard drinks    Types: 2 Cans of beer per week   Drug use: Not Currently    Comment: oxycodone is prescribed    Family Hx: [name] family history includes Breast cancer in his mother; Cancer in an other family member; Dementia in his sister; Diverticulitis in his mother; Heart disease in his father; Hypertension in an other family member; Prostate cancer in his father; Stroke in his father and another family member. There is no history of Colon cancer.  ROS see  HPI  EKGs/Labs/Other Test Reviewed:    EKG:  EKG is not ordered today.  The ekg ordered today demonstrates n/a  Recent Labs: 08/23/2021: ALT 15; TSH 1.32 12/07/2021: Platelets 266 12/14/2021: BUN 21; Creatinine, Ser 1.24 12/20/2021: Hemoglobin 12.2; Potassium 4.0; Sodium 140   Recent Lipid Panel Recent Labs    08/23/21 0940  CHOL 153  TRIG 84.0  HDL 65.20  VLDL 16.8  LDLCALC 71     Risk Assessment/Calculations:         Physical Exam:    VS:  BP 122/62 (BP Location: Right Arm, Patient Position: Sitting, Cuff Size: Normal)    Pulse 78    Ht 6' (1.829 m)    Wt 239 lb (108.4 kg)    SpO2 98%    BMI 32.41 kg/m     Wt Readings from Last 3 Encounters:  12/28/21 239 lb (108.4 kg)  12/20/21 237 lb (107.5 kg)  12/14/21 237 lb (107.5 kg)    Constitutional:      Appearance: Healthy appearance. Not in distress.  Neck:     Vascular: JVD normal.  Pulmonary:     Effort: Pulmonary effort is normal.     Breath sounds: No wheezing. No rales.  Cardiovascular:     Normal rate. Regular rhythm. Normal S1. Normal S2.      Murmurs: There is a grade 3/6 holosystolic murmur at the LLSB.     Comments: Right wrist without hematoma Edema:    Peripheral edema absent.  Abdominal:     Palpations: Abdomen is soft.  Skin:    General: Skin is warm and dry.  Neurological:     General: No focal deficit present.     Mental Status: Alert and oriented to person, place and time.     Cranial Nerves: Cranial nerves are intact.         ASSESSMENT & PLAN:   Severe mitral regurgitation He has symptomatic mitral regurgitation and is currently going through evaluation for MitraClip.  TEE is scheduled for next week.   Coronary artery disease involving native coronary artery of native heart without angina pectoris Minimal nonobstructive disease by cardiac catheterization.  He is not on antiplatelet therapy as he remains on chronic anticoagulation with Apixaban.  He remains on atorvastatin.  He is not having  anginal symptoms.  Essential hypertension Blood pressure is well controlled.  History of pulmonary embolism He remains on chronic anticoagulation with Apixaban.  He has undergone removal of his IVC filter.           Dispo:  Return for follow up with Dr. Hillery Hunter pending TEE results. .   Medication Adjustments/Labs and Tests Ordered: Current medicines are reviewed at length with the patient today.  Concerns regarding medicines are outlined above.  Tests Ordered:  No orders of the defined types were placed in this encounter.  Medication Changes: No orders of the defined types were placed in this encounter.  Signed, Richardson Dopp, PA-C  12/28/2021 8:55 AM    [facility] [place], [place], [place]  [address] Phone: [phone]; Fax: [phone]

## 2021-12-28 ENCOUNTER — Ambulatory Visit (INDEPENDENT_AMBULATORY_CARE_PROVIDER_SITE_OTHER): Payer: Medicare Other | Admitting: Physician Assistant

## 2021-12-28 ENCOUNTER — Other Ambulatory Visit: Payer: Self-pay

## 2021-12-28 ENCOUNTER — Encounter: Payer: Self-pay | Admitting: Physician Assistant

## 2021-12-28 VITALS — BP 122/62 | HR 78 | Ht 72.0 in | Wt 239.0 lb

## 2021-12-28 DIAGNOSIS — Z86711 Personal history of pulmonary embolism: Secondary | ICD-10-CM | POA: Diagnosis not present

## 2021-12-28 DIAGNOSIS — I340 Nonrheumatic mitral (valve) insufficiency: Secondary | ICD-10-CM

## 2021-12-28 DIAGNOSIS — I10 Essential (primary) hypertension: Secondary | ICD-10-CM | POA: Diagnosis not present

## 2021-12-28 DIAGNOSIS — I2510 Atherosclerotic heart disease of native coronary artery without angina pectoris: Secondary | ICD-10-CM

## 2021-12-28 NOTE — Assessment & Plan Note (Signed)
He has symptomatic mitral regurgitation and is currently going through evaluation for MitraClip.  TEE is scheduled for next week.

## 2021-12-28 NOTE — Patient Instructions (Signed)
Testing/Procedures:  Keep TEE appointment.    Follow-Up: At [facility], you and your health needs are our priority.  As part of our continuing mission to provide you with exceptional heart care, we have created designated Provider Care Teams.  These Care Teams include your primary Cardiologist (physician) and Advanced Practice Providers (APPs -  Physician Assistants and Nurse Practitioners) who all work together to provide you with the care you need, when you need it.  We recommend signing up for the patient portal called "MyChart".  Sign up information is provided on this After Visit Summary.  MyChart is used to connect with patients for Virtual Visits (Telemedicine).  Patients are able to view lab/test results, encounter notes, upcoming appointments, etc.  Non-urgent messages can be sent to your provider as well.   To learn more about what you can do with MyChart, go to [email or web address].    Your next appointment:        The format for your next appointment:     Provider:   With Dr. Hillery Hunter pending TEE.

## 2021-12-28 NOTE — Assessment & Plan Note (Signed)
He remains on chronic anticoagulation with Apixaban.  He has undergone removal of his IVC filter.

## 2021-12-28 NOTE — Assessment & Plan Note (Signed)
Blood pressure is well controlled 

## 2021-12-28 NOTE — Assessment & Plan Note (Signed)
Minimal nonobstructive disease by cardiac catheterization.  He is not on antiplatelet therapy as he remains on chronic anticoagulation with Apixaban.  He remains on atorvastatin.  He is not having anginal symptoms.

## 2021-12-31 ENCOUNTER — Other Ambulatory Visit: Payer: Self-pay | Admitting: Physician Assistant

## 2022-01-01 ENCOUNTER — Encounter (HOSPITAL_COMMUNITY): Payer: Self-pay | Admitting: Cardiovascular Disease

## 2022-01-01 ENCOUNTER — Ambulatory Visit (HOSPITAL_BASED_OUTPATIENT_CLINIC_OR_DEPARTMENT_OTHER): Payer: Medicare Other | Admitting: Certified Registered"

## 2022-01-01 ENCOUNTER — Ambulatory Visit (HOSPITAL_BASED_OUTPATIENT_CLINIC_OR_DEPARTMENT_OTHER)
Admission: RE | Admit: 2022-01-01 | Discharge: 2022-01-01 | Disposition: A | Discharge status: Home / Self Care | Payer: Medicare Other | Source: Ambulatory Visit | Attending: Internal Medicine | Admitting: Internal Medicine

## 2022-01-01 ENCOUNTER — Encounter (HOSPITAL_COMMUNITY)
Admission: RE | Disposition: A | Discharge status: Home / Self Care | Payer: Self-pay | Source: Home / Self Care | Attending: Cardiovascular Disease

## 2022-01-01 ENCOUNTER — Other Ambulatory Visit: Payer: Self-pay

## 2022-01-01 ENCOUNTER — Ambulatory Visit (HOSPITAL_COMMUNITY)
Admission: RE | Admit: 2022-01-01 | Discharge: 2022-01-01 | Disposition: A | Discharge status: Home / Self Care | Payer: Medicare Other | Attending: Cardiovascular Disease | Admitting: Cardiovascular Disease

## 2022-01-01 ENCOUNTER — Ambulatory Visit (HOSPITAL_COMMUNITY): Payer: Medicare Other | Admitting: Certified Registered"

## 2022-01-01 DIAGNOSIS — K219 Gastro-esophageal reflux disease without esophagitis: Secondary | ICD-10-CM | POA: Insufficient documentation

## 2022-01-01 DIAGNOSIS — I511 Rupture of chordae tendineae, not elsewhere classified: Secondary | ICD-10-CM | POA: Diagnosis not present

## 2022-01-01 DIAGNOSIS — I10 Essential (primary) hypertension: Secondary | ICD-10-CM | POA: Diagnosis not present

## 2022-01-01 DIAGNOSIS — E785 Hyperlipidemia, unspecified: Secondary | ICD-10-CM | POA: Insufficient documentation

## 2022-01-01 DIAGNOSIS — Z79899 Other long term (current) drug therapy: Secondary | ICD-10-CM | POA: Insufficient documentation

## 2022-01-01 DIAGNOSIS — I447 Left bundle-branch block, unspecified: Secondary | ICD-10-CM | POA: Insufficient documentation

## 2022-01-01 DIAGNOSIS — I2510 Atherosclerotic heart disease of native coronary artery without angina pectoris: Secondary | ICD-10-CM | POA: Insufficient documentation

## 2022-01-01 DIAGNOSIS — I700 Atherosclerosis of aorta: Secondary | ICD-10-CM | POA: Diagnosis not present

## 2022-01-01 DIAGNOSIS — Q2112 Patent foramen ovale: Secondary | ICD-10-CM | POA: Diagnosis not present

## 2022-01-01 DIAGNOSIS — Z86711 Personal history of pulmonary embolism: Secondary | ICD-10-CM | POA: Diagnosis not present

## 2022-01-01 DIAGNOSIS — Z7901 Long term (current) use of anticoagulants: Secondary | ICD-10-CM | POA: Insufficient documentation

## 2022-01-01 DIAGNOSIS — F32A Depression, unspecified: Secondary | ICD-10-CM

## 2022-01-01 DIAGNOSIS — I340 Nonrheumatic mitral (valve) insufficiency: Secondary | ICD-10-CM

## 2022-01-01 DIAGNOSIS — G4733 Obstructive sleep apnea (adult) (pediatric): Secondary | ICD-10-CM | POA: Insufficient documentation

## 2022-01-01 DIAGNOSIS — I4891 Unspecified atrial fibrillation: Secondary | ICD-10-CM | POA: Diagnosis not present

## 2022-01-01 DIAGNOSIS — I083 Combined rheumatic disorders of mitral, aortic and tricuspid valves: Secondary | ICD-10-CM | POA: Diagnosis not present

## 2022-01-01 HISTORY — PX: TEE WITHOUT CARDIOVERSION: SHX5443

## 2022-01-01 LAB — ECHO TEE: MV Vena cont: 0.48 cm

## 2022-01-01 SURGERY — ECHOCARDIOGRAM, TRANSESOPHAGEAL
Anesthesia: Monitor Anesthesia Care

## 2022-01-01 MED ORDER — LIDOCAINE 2% (20 MG/ML) 5 ML SYRINGE
INTRAMUSCULAR | Status: DC | PRN
  Administered 2022-01-01: 100 mg via INTRAVENOUS

## 2022-01-01 MED ORDER — SODIUM CHLORIDE 0.9 % IV SOLN: INTRAVENOUS | Status: DC

## 2022-01-01 MED ORDER — PROPOFOL 10 MG/ML IV BOLUS
INTRAVENOUS | Status: DC | PRN
  Administered 2022-01-01: 25 mg via INTRAVENOUS
  Administered 2022-01-01: 30 mg via INTRAVENOUS

## 2022-01-01 MED ORDER — PROPOFOL 500 MG/50ML IV EMUL
INTRAVENOUS | Status: DC | PRN
Start: 2022-01-01 — End: 2022-01-01
  Administered 2022-01-01: 150 ug/kg/min via INTRAVENOUS

## 2022-01-01 MED ORDER — EPHEDRINE SULFATE-NACL 50-0.9 MG/10ML-% IV SOSY
PREFILLED_SYRINGE | INTRAVENOUS | Status: DC | PRN
  Administered 2022-01-01: 7.5 mg via INTRAVENOUS
  Administered 2022-01-01: 10 mg via INTRAVENOUS
  Administered 2022-01-01: 7.5 mg via INTRAVENOUS

## 2022-01-01 NOTE — Interval H&P Note (Signed)
History and Physical Interval Note:  01/01/2022 10:36 AM  Ricky Meyers  has presented today for surgery, with the diagnosis of SEVERE MR.  The various methods of treatment have been discussed with the patient and family. After consideration of risks, benefits and other options for treatment, the patient has consented to  Procedure(s): TRANSESOPHAGEAL ECHOCARDIOGRAM (TEE) (N/A) as a surgical intervention.  [name] history has been reviewed, patient examined, no change in status, stable for surgery.  I have reviewed the patient's chart and labs.  Questions were answered to the patient's satisfaction.     Sieara Bremer

## 2022-01-01 NOTE — Transfer of Care (Signed)
Immediate Anesthesia Transfer of Care Note  Patient: Ricky Meyers  Procedure(s) Performed: TRANSESOPHAGEAL ECHOCARDIOGRAM (TEE)  Patient Location: PACU  Anesthesia Type:MAC  Level of Consciousness: awake  Airway & Oxygen Therapy: Patient Spontanous Breathing and Patient connected to nasal cannula oxygen  Post-op Assessment: Report given to RN and Post -op Vital signs reviewed and stable  Post vital signs: Reviewed and stable  Last Vitals:  Vitals Value Taken Time  BP 94/52 01/01/22 1232  Temp    Pulse 74 01/01/22 1236  Resp 21 01/01/22 1236  SpO2 98 % 01/01/22 1236  Vitals shown include unvalidated device data.  Last Pain:  Vitals:   01/01/22 1109  TempSrc: Temporal  PainSc: 0-No pain         Complications: No notable events documented.

## 2022-01-01 NOTE — Anesthesia Postprocedure Evaluation (Signed)
Anesthesia Post Note  Patient: Ricky Meyers  Procedure(s) Performed: TRANSESOPHAGEAL ECHOCARDIOGRAM (TEE)     Patient location during evaluation: PACU Anesthesia Type: MAC Level of consciousness: awake and alert and oriented Pain management: pain level controlled Vital Signs Assessment: post-procedure vital signs reviewed and stable Respiratory status: spontaneous breathing, nonlabored ventilation and respiratory function stable Cardiovascular status: stable and blood pressure returned to baseline Postop Assessment: no apparent nausea or vomiting Anesthetic complications: no   No notable events documented.  Last Vitals:  Vitals:   01/01/22 1250 01/01/22 1305  BP: 112/71 121/65  Pulse: 65 63  Resp: 14 13  Temp:  37 C  SpO2: 98% 96%    Last Pain:  Vitals:   01/01/22 1305  TempSrc:   PainSc: 0-No pain                 Marleen Moret A.

## 2022-01-01 NOTE — Progress Notes (Signed)
°  Echocardiogram Echocardiogram Transesophageal has been performed.  Ricky Meyers 01/01/2022, 12:39 PM

## 2022-01-01 NOTE — Op Note (Signed)
INDICATIONS: Mitral insufficiency  PROCEDURE:   Informed consent was obtained prior to the procedure. The risks, benefits and alternatives for the procedure were discussed and the patient comprehended these risks.  Risks include, but are not limited to, cough, sore throat, vomiting, nausea, somnolence, esophageal and stomach trauma or perforation, bleeding, low blood pressure, aspiration, pneumonia, infection, trauma to the teeth and death.    After a procedural time-out, the oropharynx was anesthetized with 20% benzocaine spray.   During this procedure the patient was administered IV propofol by Anesthesiology, Dr. Royce Macadamia.  The transesophageal probe was inserted in the esophagus and stomach without difficulty and multiple views were obtained.  The patient was kept under observation until the patient left the procedure room.  The patient left the procedure room in stable condition.   Agitated microbubble saline contrast was not administered.  COMPLICATIONS:    There were no immediate complications.  FINDINGS:  Myxomatous mitral leaflets with bileaflet prolapse. There are multiple ruptured chordae tendinae to the middle (P2) and medial (P3) scallops of the posterior leaflet, with severe and highly eccentric mitral insufficiency. Aneurysmal atrial septum with a small PFO with mostly L to R shunt. Normal LV function.  RECOMMENDATIONS:     Discuss surgical MV repair versus Mitraclip.  Time Spent Directly with the Patient:  30 minutes   Ricky Meyers 01/01/2022, 12:24 PM

## 2022-01-01 NOTE — Anesthesia Preprocedure Evaluation (Addendum)
Anesthesia Evaluation  Patient identified by MRN, date of birth, ID band Patient awake    Reviewed: Allergy & Precautions, NPO status , Patient's Chart, lab work & pertinent test results, reviewed documented beta blocker date and time   Airway Mallampati: III  TM Distance: >3 FB Neck ROM: Full    Dental  (+) Teeth Intact, Caps, Dental Advisory Given   Pulmonary sleep apnea ,    Pulmonary exam normal breath sounds clear to auscultation       Cardiovascular hypertension, Pt. on medications + CAD and + DOE  Normal cardiovascular exam+ dysrhythmias Atrial Fibrillation  Rhythm:Regular Rate:Tachycardia  Echo 11/13/21 1. Severe mitral regurgitation.  2. Left ventricular ejection fraction, by estimation, is 55 to 60%. The left ventricle has normal function. The left ventricle has no regional wall motion abnormalities. Left ventricular diastolic parameters are consistent with Grade I diastolic dysfunction (impaired relaxation).  3. Right ventricular systolic function is normal. The right ventricular size is normal.  4. Left atrial size was mildly dilated.  5. PISA radius: 1.4 cm   MR ERO: 0.73 cm2   MR Volume: 123 ml. The mitral valve is normal in structure. Severe mitral valve regurgitation. No evidence of mitral stenosis. There is moderate late systolic prolapse of the middle scallop of the posterior  leaflet of the mitral valve.  6. The aortic valve is normal in structure. Aortic valve regurgitation is not visualized. No aortic stenosis is present.  7. The inferior vena cava is normal in size with greater than 50%  respiratory variability, suggesting right atrial pressure of 3 mmHg.   EKG 12/20/21 NSR 1st deg AV [place], LBBB pattern   Neuro/Psych PSYCHIATRIC DISORDERS Depression negative neurological ROS     GI/Hepatic Neg liver ROS, GERD  Medicated and Controlled,  Endo/Other  Gout Hyperlipidemia  Renal/GU negative  Renal ROS  negative genitourinary   Musculoskeletal  (+) Arthritis , Osteoarthritis,    Abdominal (+) + obese,   Peds  Hematology  (+) Blood dyscrasia, anemia ,   Anesthesia Other Findings   Reproductive/Obstetrics ED                            Anesthesia Physical Anesthesia Plan  ASA: 3  Anesthesia Plan: MAC   Post-op Pain Management: Minimal or no pain anticipated   Induction: Intravenous  PONV Risk Score and Plan: 1 and Treatment may vary due to age or medical condition and Propofol infusion  Airway Management Planned: Natural Airway and Simple Face Mask  Additional Equipment: None  Intra-op Plan:   Post-operative Plan:   Informed Consent: I have reviewed the patients History and Physical, chart, labs and discussed the procedure including the risks, benefits and alternatives for the proposed anesthesia with the patient or authorized representative who has indicated his/her understanding and acceptance.     Dental advisory given  Plan Discussed with: Anesthesiologist and CRNA  Anesthesia Plan Comments:         Anesthesia Quick Evaluation

## 2022-01-01 NOTE — Anesthesia Procedure Notes (Signed)
Procedure Name: MAC Date/Time: 01/01/2022 12:12 PM Performed by: Imagene Riches, CRNA Pre-anesthesia Checklist: Patient identified, Emergency Drugs available, Suction available, Patient being monitored and Timeout performed Patient Re-evaluated:Patient Re-evaluated prior to induction Oxygen Delivery Method: Nasal cannula

## 2022-01-02 ENCOUNTER — Encounter (HOSPITAL_COMMUNITY): Payer: Self-pay | Admitting: Cardiovascular Disease

## 2022-01-03 ENCOUNTER — Other Ambulatory Visit: Payer: Self-pay | Admitting: Family Medicine

## 2022-01-03 ENCOUNTER — Telehealth: Payer: Self-pay

## 2022-01-03 NOTE — Telephone Encounter (Signed)
Abbott review of 01/01/2022 TEE: ? ?"For patient GO: This is primary MR  ? ?This looks like a clippable valve. The fossa looks approachable for transseptal puncture in the SAXB and Bicaval views. LA dimensions are large enough for device steering and straddle. MR is caused by bilateral leaflet flail and prolapse. The MR jet is broad based and crosses the entire valve. The posterior leaflet measures 1.3 cm in the  LVOT grasping view. Gradient was not provided; MVA measures greater than 7 cm2. I'd recommend placing an XTW just medial of A2/P2 and reassessing for second clip.  ? ?*TR noted." ? ? ? ? ?

## 2022-01-08 DIAGNOSIS — H903 Sensorineural hearing loss, bilateral: Secondary | ICD-10-CM | POA: Diagnosis not present

## 2022-01-16 NOTE — Progress Notes (Signed)
? ?   ?[address] ?      [place] [address] ?            [phone]       ? ?Ricky Meyers ?[place] Record #[id number] ?Date of Birth: 01-Sep-1946 ? ?Referring: [name], MD ?Primary Care: [name], MD ?Primary Cardiologist:Jayadeep Irish Lack, MD ? ?Chief Complaint:    ?Chief Complaint  ?Patient presents with  ? Consult  ?  Surgical consult for Mitralclip   ? ? ?History of Present Illness:     ?76 year old male referred for surgical evaluation of severe mitral valve regurgitation.  The patient was recently cervical spine operations, and has remained active.  He does have a several month history of exertional dyspnea.  Last time he played golf he had to stop and fell to his knees because he was so short of breath.  He continues to walk his dog without much difficulty. ? ?He denies any peripheral edema, paroxysmal atrial dyspnea, or orthopnea.   ? ? ?Past Medical History:  ?Diagnosis Date  ? Arthritis   ? neck and back   ? Chronic neck pain   ? Depression   ? ED (erectile dysfunction)   ? GERD (gastroesophageal reflux disease)   ? dysphagia  ? Gout   ? sees Dr. Leigh Aurora   ? Hyperlipidemia   ? Hypertension   ? OSA (obstructive sleep apnea) 12/18/2017  ? Severe with AHI at 70.1/hr and is now on CPAP at 11cm H2o  ? Prostate cancer ([facility])   ? history of  ? Pulmonary embolism ([facility]) 06/2019  ? Sleep apnea   ? snoring/never checked  ? ? ?Past Surgical History:  ?Procedure Laterality Date  ? AMPUTATION Left 07/18/2018  ? Procedure: LEFT RING FINGER REVISION AMPUTATION;  Surgeon: Leanora Cover, MD;  Location: [facility];  Service: Orthopedics;  Laterality: Left;  ? CERVICAL FUSION  12/02/2007  ? per Dr. Sherley Bounds  ? COLONOSCOPY  03/11/2017  ? per Dr. Ardis Hughs, clear, no repeats needed   ? injection lower back Right 09/17/2016  ? IR IVC FILTER PLMT / S&I /IMG GUID/MOD SED  11/15/2021  ? IR IVC FILTER RETRIEVAL / S&I /IMG GUID/MOD SED  12/14/2021  ? IR RADIOLOGIST EVAL & MGMT  10/30/2021   ? LUMBAR LAMINECTOMY/DECOMPRESSION MICRODISCECTOMY Left 11/21/2021  ? Procedure: Laminectomy and Foraminotomy - left - Lumbar three-four;  Surgeon: [name], MD;  Location: [facility];  Service: Neurosurgery;  Laterality: Left;  ? PROSTATECTOMY  2000  ? RIGHT/LEFT HEART CATH AND CORONARY ANGIOGRAPHY N/A 12/20/2021  ? Procedure: RIGHT/LEFT HEART CATH AND CORONARY ANGIOGRAPHY;  Surgeon: [name], MD;  Location: [facility];  Service: Cardiovascular;  Laterality: N/A;  ? ROTATOR CUFF REPAIR Right 11/2017  ? TEE WITHOUT CARDIOVERSION N/A 01/01/2022  ? Procedure: TRANSESOPHAGEAL ECHOCARDIOGRAM (TEE);  Surgeon: [name], MD;  Location: [facility] ENDOSCOPY;  Service: Cardiovascular;  Laterality: N/A;  ? TONSILLECTOMY    ? as a child  ? VASECTOMY    ? ? ?Social History  ? ?Tobacco Use  ?Smoking Status Never  ?Smokeless Tobacco Never  ?  ?Social History  ? ?Substance and Sexual Activity  ?Alcohol Use Yes  ? Alcohol/week: 2.0 standard drinks  ? Types: 2 Cans of beer per week  ? ? ? ?No Known Allergies ? ? ? ?Current Outpatient Medications  ?Medication Sig Dispense Refill  ? allopurinol (ZYLOPRIM) 300 MG tablet TAKE 1 TABLET BY MOUTH EVERY  DAY 90 tablet 0  ? apixaban (ELIQUIS) 5 MG TABS tablet Take 1 tablet (5 mg total) by mouth 2 (two) times daily. 180 tablet 3  ? atorvastatin (LIPITOR) 40 MG tablet TAKE 1 TABLET BY MOUTH EVERY DAY 90 tablet 0  ? cyclobenzaprine (FLEXERIL) 10 MG tablet TAKE 1 TABLET BY MOUTH 3 TIMES DAILY AS NEEDED FOR MUSCLE SPASMS 90 tablet 5  ? famotidine (PEPCID) 20 MG tablet Take 20 mg by mouth daily after supper.    ? gabapentin (NEURONTIN) 100 MG capsule Take 1 capsule (100 mg total) by mouth 4 (four) times daily. 120 capsule 5  ? METAMUCIL FIBER PO Take 1 Dose by mouth 2 (two) times daily. 1 dose = 2 teaspoons    ? oxyCODONE-acetaminophen (PERCOCET) 10-325 MG tablet Take 1 tablet by mouth every 6 (six) hours as needed for pain. 120 tablet 0  ? PARoxetine (PAXIL) 20 MG tablet Take 1  tablet (20 mg total) by mouth every morning. 90 tablet 0  ? telmisartan-hydrochlorothiazide (MICARDIS HCT) 80-25 MG tablet TAKE 1 TABLET BY MOUTH EVERY DAY 30 tablet 11  ? ?No current facility-administered medications for this visit.  ? ? ?(Not in a hospital admission) ? ? ?Family History  ?Problem Relation Age of Onset  ? Cancer Other   ?     breast, porstate  ? Hypertension Other   ? Stroke Other   ? Breast cancer Mother   ? Diverticulitis Mother   ? Heart disease Father   ? Prostate cancer Father   ? Stroke Father   ? Dementia Sister   ? Colon cancer Neg Hx   ? ? ? ?Review of Systems:  ? ?Review of Systems  ?Constitutional:  Positive for malaise/fatigue.  ?Respiratory:  Positive for shortness of breath.   ?Cardiovascular:  Negative for chest pain, palpitations, orthopnea and leg swelling.  ?  ? ?Physical Exam: ?BP 102/66   Pulse 60   Resp 20   Ht 6' (1.829 m)   Wt 237 lb (107.5 kg)   SpO2 96% Comment: RA  BMI 32.14 kg/m?  ?Physical Exam ?Constitutional:   ?   Appearance: He is obese. He is not ill-appearing.  ?Cardiovascular:  ?   Rate and Rhythm: Normal rate.  ?   Heart sounds: Murmur heard.  ?Pulmonary:  ?   Effort: Pulmonary effort is normal. No respiratory distress.  ?Abdominal:  ?   General: There is no distension.  ?Musculoskeletal:  ?   Cervical back: Normal range of motion.  ?Neurological:  ?   Mental Status: He is alert.  ?  ? ? ?Diagnostic Studies & Laboratory data: ?   ?Left Heart Catherization: ?Intervention ? ? ?Echo: ?IMPRESSIONS  ? ? ? 1. Left ventricular ejection fraction, by estimation, is 60 to 65%. The  ?left ventricle has normal function. The left ventricle has no regional  ?wall motion abnormalities.  ? 2. Right ventricular systolic function is normal. The right ventricular  ?size is normal. Tricuspid regurgitation signal is inadequate for assessing  ?PA pressure.  ? 3. Left atrial size was severely dilated. No left atrial/left atrial  ?appendage thrombus was detected.  ? 4. Multiple  ruptured mitral chordae tendinae are seen, attached to a  ?broad segment of the middle (P2) and medial (P3) scallops, but not  ?involving the medial commissure. The total flail width is almost 2 cm. The  ?maximum flail gap is approximately 5 mm.  ?There is sufficient posterior leaflet grasping length (13 mm) and the  ?  leaflets are only mildly thickened. MV area by planimetery is 7 cm sq  ?(probably overestimated, but a large valve area nonetheless). Unable to  ?obtain PISA measurements due to the  ?eccentric nature of the MR. By continuity equation, the regurgitant volume  ?is 75 ml, regurgitant fraction is 57%. MR vena contracta is 4.8 mm. The  ?mitral valve is myxomatous. Severe mitral valve regurgitation. No evidence  ?of mitral stenosis. There is  ?moderate holosystolic prolapse of both leaflets of the mitral valve.  ? 5. The aortic valve is tricuspid. Aortic valve regurgitation is trivial.  ?No aortic stenosis is present.  ? 6. There is mild (Grade II) plaque involving the descending aorta.  ? 7. Evidence of atrial level shunting detected by color flow Doppler.  ?There is a small patent foramen ovale with predominantly left to right  ?shunting across the atrial septum.  ? ?EKG: ?Sinus, 1st D AV block ?I have independently reviewed the above radiologic studies and discussed with the patient  ? ?Recent Lab Findings: ?Lab Results  ?Component Value Date  ? WBC 7.5 12/07/2021  ? HGB 12.2 (L) 12/20/2021  ? HCT 36.0 (L) 12/20/2021  ? PLT 266 12/07/2021  ? GLUCOSE 110 (H) 12/14/2021  ? CHOL 153 08/23/2021  ? TRIG 84.0 08/23/2021  ? HDL 65.20 08/23/2021  ? LDLDIRECT 161.4 09/29/2012  ? [place] 71 08/23/2021  ? ALT 15 08/23/2021  ? AST 17 08/23/2021  ? NA 140 12/20/2021  ? K 4.0 12/20/2021  ? CL 103 12/14/2021  ? CREATININE 1.24 12/14/2021  ? BUN 21 12/14/2021  ? CO2 26 12/14/2021  ? TSH 1.32 08/23/2021  ? INR 0.8 11/19/2021  ? HGBA1C 5.6 08/23/2021  ? ? ? ? ?Assessment / Plan:   ?76 year old male with severe mitral  valve regurgitation.  I personally reviewed his echocardiogram as well as his left heart catheterization.  He has nonobstructive coronary disease.  In regards to his echocardiogram he has preserved biventricular

## 2022-01-18 ENCOUNTER — Other Ambulatory Visit: Payer: Self-pay

## 2022-01-18 ENCOUNTER — Institutional Professional Consult (permissible substitution) (INDEPENDENT_AMBULATORY_CARE_PROVIDER_SITE_OTHER): Payer: Medicare Other | Admitting: Thoracic Surgery (Cardiothoracic Vascular Surgery)

## 2022-01-18 VITALS — BP 102/66 | HR 60 | Resp 20 | Ht 72.0 in | Wt 237.0 lb

## 2022-01-18 DIAGNOSIS — I340 Nonrheumatic mitral (valve) insufficiency: Secondary | ICD-10-CM

## 2022-01-21 ENCOUNTER — Other Ambulatory Visit: Payer: Self-pay | Admitting: Family Medicine

## 2022-01-21 DIAGNOSIS — M5416 Radiculopathy, lumbar region: Secondary | ICD-10-CM | POA: Diagnosis not present

## 2022-01-21 NOTE — Progress Notes (Signed)
Surgical Instructions ? ? ? Your procedure is scheduled on Thursday, March 23rd, 2023. ? ? Report to [facility] Main Entrance "A" at 08:45 A.M., then check in with the Admitting office. ? Call this number if you have problems the morning of surgery: ? [phone] ? ? If you have any questions prior to your surgery date call [phone]: Open Monday-Friday 8am-4pm ? ? ? Remember: ? Do not eat or drink after midnight the night before your surgery ?  ? Take these medicines the morning of surgery with A SIP OF WATER:  ? ?allopurinol (ZYLOPRIM)  ?atorvastatin (LIPITOR)  ?gabapentin (NEURONTIN) ?PARoxetine (PAXIL)  ? ?If needed: ? ?cyclobenzaprine (FLEXERIL) ?oxyCODONE-acetaminophen (PERCOCET)  ? ?Take last dose of Eliquis 3/20 (take PM dose then stop).  ? ?As of today, STOP taking any Aspirin (unless otherwise instructed by your surgeon) Aleve, Naproxen, Ibuprofen, Motrin, Advil, Goody's, BC's, all herbal medications, fish oil, and all vitamins. ? ? ? The day of surgery: ?         ?Do not wear jewelry  ?Do not wear lotions, powders, colognes, or deodorant. ?Men may shave face and neck. ?Do not bring valuables to the hospital. ? ? ?[facility] is not responsible for any belongings or valuables. .  ? ?Do NOT Smoke (Tobacco/Vaping)  24 hours prior to your procedure ? ?If you use a CPAP at night, you may bring your mask for your overnight stay. ?  ?Contacts, glasses, hearing aids, dentures or partials may not be worn into surgery, please bring cases for these belongings ?  ?For patients admitted to the hospital, discharge time will be determined by your treatment team. ?  ?Patients discharged the day of surgery will not be allowed to drive home, and someone needs to stay with them for 24 hours. ? ?NO VISITORS WILL BE ALLOWED IN PRE-OP WHERE PATIENTS ARE PREPPED FOR SURGERY.  ONLY 1 SUPPORT PERSON MAY BE PRESENT IN THE WAITING ROOM WHILE YOU ARE IN SURGERY.  IF YOU ARE TO BE ADMITTED, ONCE YOU ARE IN YOUR ROOM YOU WILL BE  ALLOWED TWO (2) VISITORS. 1 (ONE) VISITOR MAY STAY OVERNIGHT BUT MUST ARRIVE TO THE ROOM BY 8pm.  Minor children may have two parents present. Special consideration for safety and communication needs will be reviewed on a case by case basis. ? ?Special instructions:   ? ?Oral Hygiene is also important to reduce your risk of infection.  Remember - BRUSH YOUR TEETH THE MORNING OF SURGERY WITH YOUR REGULAR TOOTHPASTE ? ? ?[facility]- Preparing For Surgery ? ?Before surgery, you can play an important role. Because skin is not sterile, your skin needs to be as free of germs as possible. You can reduce the number of germs on your skin by washing with CHG (chlorahexidine gluconate) Soap before surgery.  CHG is an antiseptic cleaner which kills germs and bonds with the skin to continue killing germs even after washing.   ? ? ?Please do not use if you have an allergy to CHG or antibacterial soaps. If your skin becomes reddened/irritated stop using the CHG.  ?Do not shave (including legs and underarms) for at least 48 hours prior to first CHG shower. It is OK to shave your face. ? ?Please follow these instructions carefully. ?  ? ? Shower the NIGHT BEFORE SURGERY and the MORNING OF SURGERY with CHG Soap.  ? If you chose to wash your hair, wash your hair first as usual with your normal shampoo. After you shampoo, rinse your  hair and body thoroughly to remove the shampoo.  Then [employer] and genitals (private parts) with your normal soap and rinse thoroughly to remove soap. ? ?After that Use CHG Soap as you would any other liquid soap. You can apply CHG directly to the skin and wash gently with a scrungie or a clean washcloth.  ? ?Apply the CHG Soap to your body ONLY FROM THE NECK DOWN.  Do not use on open wounds or open sores. Avoid contact with your eyes, ears, mouth and genitals (private parts). Wash Face and genitals (private parts)  with your normal soap.  ? ?Wash thoroughly, paying special attention to the area where  your surgery will be performed. ? ?Thoroughly rinse your body with warm water from the neck down. ? ?DO NOT shower/wash with your normal soap after using and rinsing off the CHG Soap. ? ?Pat yourself dry with a CLEAN TOWEL. ? ?Wear CLEAN PAJAMAS to bed the night before surgery ? ?Place CLEAN SHEETS on your bed the night before your surgery ? ?DO NOT SLEEP WITH PETS. ? ? ?Day of Surgery: ? ?Take a shower with CHG soap. ?Wear Clean/Comfortable clothing the morning of surgery ?Do not apply any deodorants/lotions.   ?Remember to brush your teeth WITH YOUR REGULAR TOOTHPASTE. ? ? ? ?COVID testing ? ?If you are going to stay overnight or be admitted after your procedure/surgery and require a pre-op COVID test, please follow these instructions after your COVID test  ? ?You are not required to quarantine however you are required to wear a well-fitting mask when you are out and around people not in your household.  If your mask becomes wet or soiled, replace with a new one. ? ?Wash your hands often with soap and water for 20 seconds or clean your hands with an alcohol-based hand sanitizer that contains at least 60% alcohol. ? ?Do not share personal items. ? ?Notify your provider: ?if you are in close contact with someone who has COVID  ?or if you develop a fever of 100.4 or greater, sneezing, cough, sore throat, shortness of breath or body aches. ? ?  ?Please read over the following fact sheets that you were given.   ?

## 2022-01-22 ENCOUNTER — Encounter (HOSPITAL_COMMUNITY): Payer: Self-pay

## 2022-01-22 ENCOUNTER — Other Ambulatory Visit: Payer: Self-pay

## 2022-01-22 ENCOUNTER — Ambulatory Visit (HOSPITAL_COMMUNITY)
Admission: RE | Admit: 2022-01-22 | Discharge: 2022-01-22 | Disposition: A | Discharge status: Home / Self Care | Payer: Medicare Other | Source: Ambulatory Visit | Attending: Internal Medicine | Admitting: Internal Medicine

## 2022-01-22 ENCOUNTER — Encounter (HOSPITAL_COMMUNITY)
Admission: RE | Admit: 2022-01-22 | Discharge: 2022-01-22 | Disposition: A | Discharge status: Home / Self Care | Payer: Medicare Other | Source: Ambulatory Visit | Attending: Internal Medicine | Admitting: Internal Medicine

## 2022-01-22 VITALS — BP 135/80 | HR 83 | Temp 97.8°F | Resp 18

## 2022-01-22 DIAGNOSIS — M47814 Spondylosis without myelopathy or radiculopathy, thoracic region: Secondary | ICD-10-CM | POA: Diagnosis not present

## 2022-01-22 DIAGNOSIS — Z01818 Encounter for other preprocedural examination: Secondary | ICD-10-CM | POA: Insufficient documentation

## 2022-01-22 DIAGNOSIS — Z86711 Personal history of pulmonary embolism: Secondary | ICD-10-CM | POA: Diagnosis not present

## 2022-01-22 DIAGNOSIS — I4891 Unspecified atrial fibrillation: Secondary | ICD-10-CM | POA: Diagnosis not present

## 2022-01-22 DIAGNOSIS — I088 Other rheumatic multiple valve diseases: Secondary | ICD-10-CM | POA: Diagnosis not present

## 2022-01-22 DIAGNOSIS — Z79899 Other long term (current) drug therapy: Secondary | ICD-10-CM | POA: Diagnosis not present

## 2022-01-22 DIAGNOSIS — Z9889 Other specified postprocedural states: Secondary | ICD-10-CM | POA: Diagnosis not present

## 2022-01-22 DIAGNOSIS — K219 Gastro-esophageal reflux disease without esophagitis: Secondary | ICD-10-CM | POA: Diagnosis not present

## 2022-01-22 DIAGNOSIS — Z8042 Family history of malignant neoplasm of prostate: Secondary | ICD-10-CM | POA: Diagnosis not present

## 2022-01-22 DIAGNOSIS — Z9989 Dependence on other enabling machines and devices: Secondary | ICD-10-CM | POA: Diagnosis not present

## 2022-01-22 DIAGNOSIS — I340 Nonrheumatic mitral (valve) insufficiency: Secondary | ICD-10-CM

## 2022-01-22 DIAGNOSIS — Z954 Presence of other heart-valve replacement: Secondary | ICD-10-CM | POA: Diagnosis not present

## 2022-01-22 DIAGNOSIS — Z7901 Long term (current) use of anticoagulants: Secondary | ICD-10-CM | POA: Diagnosis not present

## 2022-01-22 DIAGNOSIS — Z8249 Family history of ischemic heart disease and other diseases of the circulatory system: Secondary | ICD-10-CM | POA: Diagnosis not present

## 2022-01-22 DIAGNOSIS — J42 Unspecified chronic bronchitis: Secondary | ICD-10-CM | POA: Diagnosis not present

## 2022-01-22 DIAGNOSIS — I10 Essential (primary) hypertension: Secondary | ICD-10-CM | POA: Diagnosis not present

## 2022-01-22 DIAGNOSIS — Z20822 Contact with and (suspected) exposure to covid-19: Secondary | ICD-10-CM | POA: Diagnosis not present

## 2022-01-22 DIAGNOSIS — Z86718 Personal history of other venous thrombosis and embolism: Secondary | ICD-10-CM | POA: Diagnosis not present

## 2022-01-22 DIAGNOSIS — Z803 Family history of malignant neoplasm of breast: Secondary | ICD-10-CM | POA: Diagnosis not present

## 2022-01-22 DIAGNOSIS — Z006 Encounter for examination for normal comparison and control in clinical research program: Secondary | ICD-10-CM | POA: Diagnosis not present

## 2022-01-22 DIAGNOSIS — I2510 Atherosclerotic heart disease of native coronary artery without angina pectoris: Secondary | ICD-10-CM | POA: Diagnosis not present

## 2022-01-22 DIAGNOSIS — E785 Hyperlipidemia, unspecified: Secondary | ICD-10-CM | POA: Diagnosis not present

## 2022-01-22 DIAGNOSIS — G4733 Obstructive sleep apnea (adult) (pediatric): Secondary | ICD-10-CM | POA: Diagnosis not present

## 2022-01-22 DIAGNOSIS — Z823 Family history of stroke: Secondary | ICD-10-CM | POA: Diagnosis not present

## 2022-01-22 DIAGNOSIS — Z8546 Personal history of malignant neoplasm of prostate: Secondary | ICD-10-CM | POA: Diagnosis not present

## 2022-01-22 DIAGNOSIS — M47812 Spondylosis without myelopathy or radiculopathy, cervical region: Secondary | ICD-10-CM | POA: Diagnosis not present

## 2022-01-22 DIAGNOSIS — Z95818 Presence of other cardiac implants and grafts: Secondary | ICD-10-CM | POA: Diagnosis not present

## 2022-01-22 LAB — BLOOD GAS, ARTERIAL
Acid-Base Excess: 2.4 mmol/L — ABNORMAL HIGH (ref 0.0–2.0)
Bicarbonate: 26.4 mmol/L (ref 20.0–28.0)
Drawn by: 58793
O2 Saturation: 98.5 %
Patient temperature: 37
pCO2 arterial: 38 mmHg (ref 32–48)
pH, Arterial: 7.45 (ref 7.35–7.45)
pO2, Arterial: 93 mmHg (ref 83–108)

## 2022-01-22 LAB — SURGICAL PCR SCREEN
MRSA, PCR: NEGATIVE
Staphylococcus aureus: NEGATIVE

## 2022-01-22 LAB — TYPE AND SCREEN
ABO/RH(D): A NEG
Antibody Screen: NEGATIVE

## 2022-01-22 LAB — COMPREHENSIVE METABOLIC PANEL
ALT: 24 U/L (ref 0–44)
AST: 28 U/L (ref 15–41)
Albumin: 3.9 g/dL (ref 3.5–5.0)
Alkaline Phosphatase: 59 U/L (ref 38–126)
Anion gap: 12 (ref 5–15)
BUN: 17 mg/dL (ref 8–23)
CO2: 21 mmol/L — ABNORMAL LOW (ref 22–32)
Calcium: 9.4 mg/dL (ref 8.9–10.3)
Chloride: 102 mmol/L (ref 98–111)
Creatinine, Ser: 1.04 mg/dL (ref 0.61–1.24)
GFR, Estimated: >60 mL/min (ref >60)
Glucose, Bld: 112 mg/dL — ABNORMAL HIGH (ref 70–99)
Potassium: 4.2 mmol/L (ref 3.5–5.1)
Sodium: 135 mmol/L (ref 135–145)
Total Bilirubin: 1.1 mg/dL (ref 0.3–1.2)
Total Protein: 6.8 g/dL (ref 6.5–8.1)

## 2022-01-22 LAB — URINALYSIS, ROUTINE W REFLEX MICROSCOPIC
Bilirubin Urine: NEGATIVE
Glucose, UA: NEGATIVE mg/dL
Hgb urine dipstick: NEGATIVE
Ketones, ur: NEGATIVE mg/dL
Leukocytes,Ua: NEGATIVE
Nitrite: NEGATIVE
Protein, ur: NEGATIVE mg/dL
Specific Gravity, Urine: 1.016 (ref 1.005–1.030)
pH: 6 (ref 5.0–8.0)

## 2022-01-22 LAB — CBC
HCT: 38.8 % — ABNORMAL LOW (ref 39.0–52.0)
Hemoglobin: 12.9 g/dL — ABNORMAL LOW (ref 13.0–17.0)
MCH: 31.4 pg (ref 26.0–34.0)
MCHC: 33.2 g/dL (ref 30.0–36.0)
MCV: 94.4 fL (ref 80.0–100.0)
Platelets: 202 10*3/uL (ref 150–400)
RBC: 4.11 MIL/uL — ABNORMAL LOW (ref 4.22–5.81)
RDW: 14.8 % (ref 11.5–15.5)
WBC: 6.1 10*3/uL (ref 4.0–10.5)
nRBC: 0 % (ref 0.0–0.2)

## 2022-01-22 LAB — BRAIN NATRIURETIC PEPTIDE: B Natriuretic Peptide: 133.8 pg/mL — ABNORMAL HIGH (ref 0.0–100.0)

## 2022-01-22 LAB — SARS CORONAVIRUS 2 (TAT 6-24 HRS): SARS Coronavirus 2: NEGATIVE

## 2022-01-22 LAB — PROTIME-INR
INR: 1.1 (ref 0.8–1.2)
Prothrombin Time: 13.8 seconds (ref 11.4–15.2)

## 2022-01-22 NOTE — Progress Notes (Addendum)
COVID test canceled - patient was tested positive for COVID on 10/30/2021 (home test). [facility] lab notified. ?

## 2022-01-22 NOTE — Progress Notes (Signed)
BNP elevated in PAT - 133.8. Dr. Ali Lowe office was notified Ricky German, RN) ?

## 2022-01-22 NOTE — Progress Notes (Addendum)
PCP - Alysia Penna, MD ?Cardiologist - [name], MD ? ?PPM/ICD - denies ?Device Orders - n/a ?Rep Notified - n/a ? ?Chest x-ray - 01/22/2022 ?EKG - 01/22/2022 ?Stress Test - 11/13/2021 ?ECHO - 01/01/2022 ?Cardiac Cath - 12/20/2021 ? ?Sleep Study - yes, positive for OSA ?CPAP - yea, not regular ? ?Fasting Blood Sugar - n/a ? ?Blood Thinner Instructions: Eliquis - last dose - 01/21/2022 ? ?Aspirin Instructions: Patient was instructed: As of today, STOP taking any Aspirin (unless otherwise instructed by your surgeon) Aleve, Naproxen, Ibuprofen, Motrin, Advil, Goody's, BC's, all herbal medications, fish oil, and all vitamins. ? ?ERAS Protcol - n/a ? ?COVID TEST- no, patient was tested positive for COVID on 10/30/2021 - home test ? ? ?Anesthesia review: yes - history of PE ? ?Patient denies shortness of breath, fever, cough and chest pain at PAT appointment ? ? ?All instructions explained to the patient, with a verbal understanding of the material. Patient agrees to go over the instructions while at home for a better understanding. Patient also instructed to self quarantine after being tested for COVID-19. The opportunity to ask questions was provided. ?  ?

## 2022-01-23 MED ORDER — PHENYLEPHRINE HCL-NACL 20-0.9 MG/250ML-% IV SOLN
30.0000 ug/min | INTRAVENOUS | Status: DC
  Filled 2022-01-23 (×2): qty 250

## 2022-01-23 MED ORDER — TRANEXAMIC ACID (OHS) BOLUS VIA INFUSION
15.0000 mg/kg | INTRAVENOUS | Status: DC
  Filled 2022-01-23 (×3): qty 1613

## 2022-01-23 MED ORDER — CEFAZOLIN SODIUM-DEXTROSE 2-4 GM/100ML-% IV SOLN
2.0000 g | INTRAVENOUS | Status: DC
  Filled 2022-01-23 (×2): qty 100

## 2022-01-23 MED ORDER — MILRINONE LACTATE IN DEXTROSE 20-5 MG/100ML-% IV SOLN
0.3000 ug/kg/min | INTRAVENOUS | Status: DC
  Filled 2022-01-23 (×2): qty 100

## 2022-01-23 MED ORDER — TRANEXAMIC ACID (OHS) PUMP PRIME SOLUTION
2.0000 mg/kg | INTRAVENOUS | Status: DC
  Filled 2022-01-23 (×2): qty 2.15

## 2022-01-23 MED ORDER — HEPARIN 30,000 UNITS/1000 ML (OHS) CELLSAVER SOLUTION
Status: DC
Start: 2022-01-24 — End: 2022-01-24
  Filled 2022-01-23 (×2): qty 1000

## 2022-01-23 MED ORDER — NOREPINEPHRINE 4 MG/250ML-% IV SOLN
0.0000 ug/min | INTRAVENOUS | Status: AC
  Administered 2022-01-24: 1 ug/min via INTRAVENOUS
  Filled 2022-01-23: qty 250

## 2022-01-23 MED ORDER — VANCOMYCIN HCL 1500 MG/300ML IV SOLN
1500.0000 mg | INTRAVENOUS | Status: DC
Start: 2022-01-24 — End: 2022-01-24
  Filled 2022-01-23 (×2): qty 300

## 2022-01-23 MED ORDER — CEFAZOLIN SODIUM-DEXTROSE 2-4 GM/100ML-% IV SOLN
2.0000 g | INTRAVENOUS | Status: AC
  Administered 2022-01-24: 2 g via INTRAVENOUS
  Filled 2022-01-23 (×2): qty 100

## 2022-01-23 MED ORDER — MAGNESIUM SULFATE 50 % IJ SOLN
40.0000 meq | INTRAMUSCULAR | Status: DC
  Filled 2022-01-23 (×2): qty 9.85

## 2022-01-23 MED ORDER — TRANEXAMIC ACID 1000 MG/10ML IV SOLN
1.5000 mg/kg/h | INTRAVENOUS | Status: DC
  Filled 2022-01-23 (×2): qty 25

## 2022-01-23 MED ORDER — PLASMA-LYTE A IV SOLN
INTRAVENOUS | Status: DC
  Filled 2022-01-23 (×2): qty 2.5

## 2022-01-23 MED ORDER — INSULIN REGULAR(HUMAN) IN NACL 100-0.9 UT/100ML-% IV SOLN
INTRAVENOUS | Status: DC
  Filled 2022-01-23 (×2): qty 100

## 2022-01-23 MED ORDER — EPINEPHRINE HCL 5 MG/250ML IV SOLN IN NS
0.0000 ug/min | INTRAVENOUS | Status: DC
  Filled 2022-01-23 (×2): qty 250

## 2022-01-23 MED ORDER — NITROGLYCERIN IN D5W 200-5 MCG/ML-% IV SOLN
2.0000 ug/min | INTRAVENOUS | Status: DC
  Filled 2022-01-23: qty 250

## 2022-01-23 MED ORDER — POTASSIUM CHLORIDE 2 MEQ/ML IV SOLN
80.0000 meq | INTRAVENOUS | Status: DC
Start: 2022-01-24 — End: 2022-01-24
  Filled 2022-01-23 (×2): qty 40

## 2022-01-23 MED ORDER — DEXMEDETOMIDINE HCL IN NACL 400 MCG/100ML IV SOLN
0.1000 ug/kg/h | INTRAVENOUS | Status: DC
  Filled 2022-01-23 (×2): qty 100

## 2022-01-24 ENCOUNTER — Inpatient Hospital Stay (HOSPITAL_COMMUNITY): Payer: Medicare Other | Admitting: Physician Assistant

## 2022-01-24 ENCOUNTER — Inpatient Hospital Stay (HOSPITAL_COMMUNITY): Payer: Medicare Other | Admitting: Critical Care Medicine

## 2022-01-24 ENCOUNTER — Inpatient Hospital Stay (HOSPITAL_COMMUNITY)
Admission: RE | Admit: 2022-01-24 | Discharge: 2022-01-24 | Disposition: A | Discharge status: Home / Self Care | Payer: Medicare Other | Source: Ambulatory Visit | Attending: Internal Medicine | Admitting: Internal Medicine

## 2022-01-24 ENCOUNTER — Encounter (HOSPITAL_COMMUNITY): Payer: Self-pay | Admitting: Internal Medicine

## 2022-01-24 ENCOUNTER — Other Ambulatory Visit: Payer: Self-pay

## 2022-01-24 ENCOUNTER — Inpatient Hospital Stay (HOSPITAL_COMMUNITY)
Admission: RE | Admit: 2022-01-24 | Discharge: 2022-01-25 | DRG: 267 | Disposition: A | Discharge status: Home / Self Care | Payer: Medicare Other | Source: Ambulatory Visit | Attending: Internal Medicine | Admitting: Internal Medicine

## 2022-01-24 ENCOUNTER — Encounter (HOSPITAL_COMMUNITY)
Admission: RE | Disposition: A | Discharge status: Home / Self Care | Payer: Self-pay | Source: Ambulatory Visit | Attending: Internal Medicine

## 2022-01-24 DIAGNOSIS — Z823 Family history of stroke: Secondary | ICD-10-CM

## 2022-01-24 DIAGNOSIS — Z006 Encounter for examination for normal comparison and control in clinical research program: Secondary | ICD-10-CM

## 2022-01-24 DIAGNOSIS — Z9989 Dependence on other enabling machines and devices: Secondary | ICD-10-CM | POA: Diagnosis not present

## 2022-01-24 DIAGNOSIS — Z954 Presence of other heart-valve replacement: Secondary | ICD-10-CM | POA: Diagnosis not present

## 2022-01-24 DIAGNOSIS — E785 Hyperlipidemia, unspecified: Secondary | ICD-10-CM | POA: Diagnosis present

## 2022-01-24 DIAGNOSIS — Z86711 Personal history of pulmonary embolism: Secondary | ICD-10-CM | POA: Diagnosis not present

## 2022-01-24 DIAGNOSIS — I340 Nonrheumatic mitral (valve) insufficiency: Secondary | ICD-10-CM

## 2022-01-24 DIAGNOSIS — I10 Essential (primary) hypertension: Secondary | ICD-10-CM

## 2022-01-24 DIAGNOSIS — I4891 Unspecified atrial fibrillation: Secondary | ICD-10-CM

## 2022-01-24 DIAGNOSIS — Z7901 Long term (current) use of anticoagulants: Secondary | ICD-10-CM | POA: Diagnosis not present

## 2022-01-24 DIAGNOSIS — K219 Gastro-esophageal reflux disease without esophagitis: Secondary | ICD-10-CM | POA: Diagnosis present

## 2022-01-24 DIAGNOSIS — Z95818 Presence of other cardiac implants and grafts: Secondary | ICD-10-CM | POA: Diagnosis not present

## 2022-01-24 DIAGNOSIS — Z86718 Personal history of other venous thrombosis and embolism: Secondary | ICD-10-CM

## 2022-01-24 DIAGNOSIS — Z8042 Family history of malignant neoplasm of prostate: Secondary | ICD-10-CM | POA: Diagnosis not present

## 2022-01-24 DIAGNOSIS — G4733 Obstructive sleep apnea (adult) (pediatric): Secondary | ICD-10-CM | POA: Diagnosis present

## 2022-01-24 DIAGNOSIS — I2510 Atherosclerotic heart disease of native coronary artery without angina pectoris: Secondary | ICD-10-CM | POA: Diagnosis present

## 2022-01-24 DIAGNOSIS — I088 Other rheumatic multiple valve diseases: Secondary | ICD-10-CM | POA: Diagnosis not present

## 2022-01-24 DIAGNOSIS — Z79899 Other long term (current) drug therapy: Secondary | ICD-10-CM | POA: Diagnosis not present

## 2022-01-24 DIAGNOSIS — Z8546 Personal history of malignant neoplasm of prostate: Secondary | ICD-10-CM | POA: Diagnosis not present

## 2022-01-24 DIAGNOSIS — Z8249 Family history of ischemic heart disease and other diseases of the circulatory system: Secondary | ICD-10-CM | POA: Diagnosis not present

## 2022-01-24 DIAGNOSIS — Z20822 Contact with and (suspected) exposure to covid-19: Secondary | ICD-10-CM | POA: Diagnosis present

## 2022-01-24 DIAGNOSIS — I2699 Other pulmonary embolism without acute cor pulmonale: Secondary | ICD-10-CM | POA: Diagnosis present

## 2022-01-24 DIAGNOSIS — Z9889 Other specified postprocedural states: Secondary | ICD-10-CM

## 2022-01-24 DIAGNOSIS — Z803 Family history of malignant neoplasm of breast: Secondary | ICD-10-CM | POA: Diagnosis not present

## 2022-01-24 DIAGNOSIS — I447 Left bundle-branch block, unspecified: Secondary | ICD-10-CM | POA: Diagnosis present

## 2022-01-24 HISTORY — PX: TEE WITHOUT CARDIOVERSION: SHX5443

## 2022-01-24 HISTORY — DX: Other specified postprocedural states: Z98.890

## 2022-01-24 HISTORY — PX: TRANSCATHETER MITRAL EDGE TO EDGE REPAIR: CATH118311

## 2022-01-24 LAB — POCT ACTIVATED CLOTTING TIME
Activated Clotting Time: 263 seconds
Activated Clotting Time: 293 seconds
Activated Clotting Time: 299 seconds
Activated Clotting Time: 281 seconds

## 2022-01-24 LAB — ECHO TEE
MV M vel: 5.07 m/s
MV Peak grad: 102.8 mmHg

## 2022-01-24 LAB — ABO/RH: ABO/RH(D): A NEG

## 2022-01-24 SURGERY — MITRAL VALVE REPAIR
Anesthesia: General

## 2022-01-24 MED ORDER — PROPOFOL 10 MG/ML IV BOLUS
INTRAVENOUS | Status: DC | PRN
  Administered 2022-01-24: 140 mg via INTRAVENOUS
  Administered 2022-01-24: 30 mg via INTRAVENOUS
  Administered 2022-01-24: 20 mg via INTRAVENOUS

## 2022-01-24 MED ORDER — IRBESARTAN 150 MG PO TABS
300.0000 mg | ORAL_TABLET | Freq: Every day | ORAL | Status: DC
  Administered 2022-01-24 – 2022-01-25 (×2): 300 mg via ORAL
  Filled 2022-01-24 (×2): qty 2

## 2022-01-24 MED ORDER — SODIUM CHLORIDE 0.9 % IV SOLN
INTRAVENOUS | Status: AC | PRN
  Administered 2022-01-24: 10 mL/h via INTRAVENOUS

## 2022-01-24 MED ORDER — ACETAMINOPHEN 325 MG PO TABS: 650.0000 mg | ORAL_TABLET | ORAL | Status: DC | PRN

## 2022-01-24 MED ORDER — CHLORHEXIDINE GLUCONATE 0.12 % MT SOLN
OROMUCOSAL | Status: AC
  Administered 2022-01-24: 15 mL via OROMUCOSAL
  Filled 2022-01-24: qty 15

## 2022-01-24 MED ORDER — SODIUM CHLORIDE 0.9 % IV SOLN: INTRAVENOUS | Status: DC

## 2022-01-24 MED ORDER — CHLORHEXIDINE GLUCONATE 0.12 % MT SOLN: 15.0000 mL | Freq: Once | OROMUCOSAL | Status: AC

## 2022-01-24 MED ORDER — ATORVASTATIN CALCIUM 40 MG PO TABS
40.0000 mg | ORAL_TABLET | Freq: Every day | ORAL | Status: DC
  Administered 2022-01-25: 40 mg via ORAL
  Filled 2022-01-24 (×2): qty 1

## 2022-01-24 MED ORDER — PHENYLEPHRINE HCL-NACL 20-0.9 MG/250ML-% IV SOLN
INTRAVENOUS | Status: DC | PRN
  Administered 2022-01-24: 15 ug/min via INTRAVENOUS

## 2022-01-24 MED ORDER — APIXABAN 5 MG PO TABS
5.0000 mg | ORAL_TABLET | Freq: Two times a day (BID) | ORAL | Status: DC
  Administered 2022-01-24 – 2022-01-25 (×2): 5 mg via ORAL
  Filled 2022-01-24 (×2): qty 1

## 2022-01-24 MED ORDER — CHLORHEXIDINE GLUCONATE 4 % EX LIQD: 60.0000 mL | Freq: Once | CUTANEOUS | Status: DC

## 2022-01-24 MED ORDER — HEPARIN (PORCINE) IN NACL 1000-0.9 UT/500ML-% IV SOLN
INTRAVENOUS | Status: AC
  Filled 2022-01-24: qty 500

## 2022-01-24 MED ORDER — SODIUM CHLORIDE 0.9% FLUSH
3.0000 mL | INTRAVENOUS | Status: DC | PRN
Start: 2022-01-24 — End: 2022-01-25

## 2022-01-24 MED ORDER — ROCURONIUM BROMIDE 10 MG/ML (PF) SYRINGE
PREFILLED_SYRINGE | INTRAVENOUS | Status: DC | PRN
  Administered 2022-01-24: 70 mg via INTRAVENOUS
  Administered 2022-01-24: 10 mg via INTRAVENOUS
  Administered 2022-01-24: 20 mg via INTRAVENOUS
  Administered 2022-01-24: 10 mg via INTRAVENOUS

## 2022-01-24 MED ORDER — ALLOPURINOL 300 MG PO TABS
300.0000 mg | ORAL_TABLET | Freq: Every day | ORAL | Status: DC
  Administered 2022-01-25: 300 mg via ORAL
  Filled 2022-01-24 (×2): qty 1

## 2022-01-24 MED ORDER — HYDROCHLOROTHIAZIDE 25 MG PO TABS
25.0000 mg | ORAL_TABLET | Freq: Every day | ORAL | Status: DC
  Administered 2022-01-24 – 2022-01-25 (×2): 25 mg via ORAL
  Filled 2022-01-24 (×2): qty 1

## 2022-01-24 MED ORDER — HEPARIN (PORCINE) IN NACL 1000-0.9 UT/500ML-% IV SOLN
INTRAVENOUS | Status: DC | PRN
  Administered 2022-01-24 (×2): 500 mL

## 2022-01-24 MED ORDER — PAROXETINE HCL 20 MG PO TABS
20.0000 mg | ORAL_TABLET | Freq: Every morning | ORAL | Status: DC
  Administered 2022-01-25: 20 mg via ORAL
  Filled 2022-01-24: qty 1

## 2022-01-24 MED ORDER — LABETALOL HCL 5 MG/ML IV SOLN: 10.0000 mg | INTRAVENOUS | Status: DC | PRN

## 2022-01-24 MED ORDER — FAMOTIDINE 20 MG PO TABS
20.0000 mg | ORAL_TABLET | Freq: Every day | ORAL | Status: DC
  Administered 2022-01-24: 20 mg via ORAL
  Filled 2022-01-24: qty 1

## 2022-01-24 MED ORDER — OXYCODONE-ACETAMINOPHEN 10-325 MG PO TABS: 1.0000 | ORAL_TABLET | Freq: Four times a day (QID) | ORAL | Status: DC | PRN

## 2022-01-24 MED ORDER — PHENYLEPHRINE 40 MCG/ML (10ML) SYRINGE FOR IV PUSH (FOR BLOOD PRESSURE SUPPORT)
PREFILLED_SYRINGE | INTRAVENOUS | Status: DC | PRN
  Administered 2022-01-24 (×4): 40 ug via INTRAVENOUS

## 2022-01-24 MED ORDER — GABAPENTIN 100 MG PO CAPS
200.0000 mg | ORAL_CAPSULE | Freq: Two times a day (BID) | ORAL | Status: DC
  Administered 2022-01-24 – 2022-01-25 (×2): 200 mg via ORAL
  Filled 2022-01-24 (×2): qty 2

## 2022-01-24 MED ORDER — LIDOCAINE 2% (20 MG/ML) 5 ML SYRINGE
INTRAMUSCULAR | Status: DC | PRN
  Administered 2022-01-24: 60 mg via INTRAVENOUS

## 2022-01-24 MED ORDER — EPHEDRINE SULFATE-NACL 50-0.9 MG/10ML-% IV SOSY
PREFILLED_SYRINGE | INTRAVENOUS | Status: DC | PRN
  Administered 2022-01-24 (×2): 2.5 mg via INTRAVENOUS

## 2022-01-24 MED ORDER — SODIUM CHLORIDE 0.9 % IV SOLN: 250.0000 mL | INTRAVENOUS | Status: DC | PRN

## 2022-01-24 MED ORDER — HEPARIN (PORCINE) IN NACL 2000-0.9 UNIT/L-% IV SOLN
INTRAVENOUS | Status: AC
  Filled 2022-01-24: qty 3000

## 2022-01-24 MED ORDER — FENTANYL CITRATE (PF) 100 MCG/2ML IJ SOLN
INTRAMUSCULAR | Status: DC | PRN
  Administered 2022-01-24: 100 ug via INTRAVENOUS

## 2022-01-24 MED ORDER — OXYCODONE HCL 5 MG PO TABS
5.0000 mg | ORAL_TABLET | Freq: Four times a day (QID) | ORAL | Status: DC | PRN
  Administered 2022-01-24: 5 mg via ORAL
  Filled 2022-01-24: qty 1

## 2022-01-24 MED ORDER — TELMISARTAN-HCTZ 80-25 MG PO TABS
1.0000 | ORAL_TABLET | Freq: Every day | ORAL | Status: DC
Start: 2022-01-24 — End: 2022-01-24

## 2022-01-24 MED ORDER — CHLORHEXIDINE GLUCONATE 4 % EX LIQD: 30.0000 mL | CUTANEOUS | Status: DC

## 2022-01-24 MED ORDER — OXYCODONE-ACETAMINOPHEN 5-325 MG PO TABS
1.0000 | ORAL_TABLET | Freq: Four times a day (QID) | ORAL | Status: DC | PRN
  Administered 2022-01-24 – 2022-01-25 (×2): 1 via ORAL
  Filled 2022-01-24 (×2): qty 1

## 2022-01-24 MED ORDER — CEFAZOLIN SODIUM-DEXTROSE 2-4 GM/100ML-% IV SOLN: 2.0000 g | INTRAVENOUS | Status: DC

## 2022-01-24 MED ORDER — LACTATED RINGERS IV SOLN: INTRAVENOUS | Status: DC | PRN

## 2022-01-24 MED ORDER — HEPARIN SODIUM (PORCINE) 1000 UNIT/ML IJ SOLN
INTRAMUSCULAR | Status: DC | PRN
  Administered 2022-01-24: 3000 [IU] via INTRAVENOUS
  Administered 2022-01-24: 15000 [IU] via INTRAVENOUS
  Administered 2022-01-24: 4000 [IU] via INTRAVENOUS

## 2022-01-24 MED ORDER — DEXAMETHASONE SODIUM PHOSPHATE 10 MG/ML IJ SOLN
INTRAMUSCULAR | Status: DC | PRN
  Administered 2022-01-24: 4 mg via INTRAVENOUS

## 2022-01-24 MED ORDER — LACTATED RINGERS IV SOLN: INTRAVENOUS | Status: DC

## 2022-01-24 MED ORDER — ONDANSETRON HCL 4 MG/2ML IJ SOLN
INTRAMUSCULAR | Status: DC | PRN
  Administered 2022-01-24: 4 mg via INTRAVENOUS

## 2022-01-24 MED ORDER — FUROSEMIDE 10 MG/ML IJ SOLN
INTRAMUSCULAR | Status: DC | PRN
  Administered 2022-01-24: 10 mg via INTRAMUSCULAR

## 2022-01-24 MED ORDER — ONDANSETRON HCL 4 MG/2ML IJ SOLN: 4.0000 mg | Freq: Four times a day (QID) | INTRAMUSCULAR | Status: DC | PRN

## 2022-01-24 MED ORDER — SODIUM CHLORIDE 0.9% FLUSH
3.0000 mL | Freq: Two times a day (BID) | INTRAVENOUS | Status: DC
  Administered 2022-01-24 – 2022-01-25 (×2): 3 mL via INTRAVENOUS

## 2022-01-24 MED ORDER — HYDRALAZINE HCL 20 MG/ML IJ SOLN: 5.0000 mg | INTRAMUSCULAR | Status: DC | PRN

## 2022-01-24 MED ORDER — FUROSEMIDE 10 MG/ML IJ SOLN
INTRAMUSCULAR | Status: AC
  Filled 2022-01-24: qty 4

## 2022-01-24 SURGICAL SUPPLY — 21 items
CATH MITRA STEERABLE GUIDE (CATHETERS) ×1 IMPLANT
CLIP MITRA G4 DELIVERY SYS XTW (Clip) ×2 IMPLANT
CLOSURE PERCLOSE PROSTYLE (VASCULAR PRODUCTS) ×2 IMPLANT
DRYSEAL FLEXSHEATH 24FR 33CM (SHEATH) ×1
ELECT DEFIB PAD ADLT CADENCE (PAD) ×1 IMPLANT
GUIDEWIRE SAFE TJ AMPLATZ EXST (WIRE) ×1 IMPLANT
KIT DILATOR VASC 18G NDL (KITS) ×1 IMPLANT
KIT HEART LEFT (KITS) ×4 IMPLANT
KIT VERSACROSS LRG ACCESS (CATHETERS) ×1 IMPLANT
PACK CARDIAC CATHETERIZATION (CUSTOM PROCEDURE TRAY) ×2 IMPLANT
PAD DEFIB RADIO PHYSIO CONN (PAD) ×1 IMPLANT
SHEATH DRYSEAL FLEX 24FR 33CM (SHEATH) IMPLANT
SHEATH PINNACLE 8F 10CM (SHEATH) ×1 IMPLANT
SHEATH PROBE COVER 6X72 (BAG) ×2 IMPLANT
SHIELD RADPAD SCOOP 12X17 (MISCELLANEOUS) ×1 IMPLANT
STOPCOCK MORSE 400PSI 3WAY (MISCELLANEOUS) ×12 IMPLANT
SYSTEM MITRACLIP G4 (SYSTAGENIX WOUND MANAGEMENT) ×1 IMPLANT
TRANSDUCER W/STOPCOCK (MISCELLANEOUS) ×2 IMPLANT
TUBING ART PRESS 72  MALE/FEM (TUBING) ×2
TUBING ART PRESS 72 MALE/FEM (TUBING) ×1 IMPLANT
WIRE SAFARI SM CURVE 275 (WIRE) ×1 IMPLANT

## 2022-01-24 NOTE — Transfer of Care (Signed)
Immediate Anesthesia Transfer of Care Note ? ?Patient: Ricky Meyers ? ?Procedure(s) Performed: MITRAL VALVE REPAIR ?TRANSESOPHAGEAL ECHOCARDIOGRAM (TEE) ? ?Patient Location: Cath Lab ? ?Anesthesia Type:General ? ?Level of Consciousness: awake ? ?Airway & Oxygen Therapy: Patient Spontanous Breathing and Patient connected to nasal cannula oxygen ? ?Post-op Assessment: Report given to RN and Post -op Vital signs reviewed and stable ? ?Post vital signs: Reviewed and stable ? ?Last Vitals:  ?Vitals Value Taken Time  ?BP 124/59 01/24/22 1427  ?Temp    ?Pulse 73 01/24/22 1428  ?Resp 19 01/24/22 1428  ?SpO2 98 % 01/24/22 1428  ?Vitals shown include unvalidated device data. ? ?Last Pain:  ?Vitals:  ? 01/24/22 0855  ?TempSrc:   ?PainSc: 0-No pain  ?   ? ?  ? ?Complications: There were no known notable events for this encounter. ?

## 2022-01-24 NOTE — Op Note (Signed)
?[facility]   ?MULTIDISCIPLINARY HEART TEAM ? ?Date of Procedure:  01/24/2022 ? ?Preoperative Diagnosis: Severe Symptomatic Mitral Regurgitation (Stage D) ? ?Postoperative Diagnosis: Same  ? ?Procedure Performed: ?Ultrasound-guided right transfemoral venous access ?Double PreClose right femoral vein ?Transseptal puncture using Bailess RF needle ?Mitral valve repair with MitraClip XTW x 1 and NTW x1 ? ?Surgeon: Lenna Sciara, MD ?Co-surgeon: Blane Ohara, MD  ? ?Echocardiographer: Rudean Haskell, MD and Jenkins Rouge, MD ? ?Anesthesiologist: Rochele Pages, DO ? ?Device Implant: Mitraclip G4 XTW and Mitraclip G4 NTW ? ?Procedural Indication: ?Severe Non-rheumatic Mitral Regurgitation (Stage D)  ? ?Brief History: ?76 yo male on chronic anticoagulation for DVT/PE who severe symptomatic MR secondary to a flail P2/P3 ? ?Echo Findings: ?Preop:  ?Normal LV systolic function ?Severe MR secondary to flail posterior leaflet, Grade 4+ ?Post-op:  ?Unchanged LV systolic function ?1+ residual MR ? ?Procedural Details: ?Prep ?The patient is brought to the cardiac catheterization lab in the fasting state. General anesthesia is induced. The patient is prepped from the groin to chin. Hemodynamics are monitored via a radial artery line.  ? ?Venous Access ?Using ultrasound guidance, the right femoral vein is punctured. Ultrasound images are captured and stored in the patient's chart. The vein is dilated and 2 Perclose devices are deployed at 10' and 2' positions to 'Preclose' the femoral vein. An 8 Fr sheath is inserted. ? ?Transseptal Puncture ?A [place] Versacross wire is advanced into the SVC ?A [place]lis transseptal dilator is advanced into the SVC, and the VersaCross RF wire is retracted into the dilator  ?The transseptal sheath is retracted into the RA under fluoroscopic and echo guidance to obtain position on the posterior fossa where echo measurements are made to assure appropriate access to the mitral  valve. Once proper position is confirmed by echo, RF energy is delivered and the VersaCross wire is advanced into the LA without resistance. The dilator and sheath are advanced over the wire where proper position is confirmed by echo and pressure measurement ?Weight based IV heparin is administered and a therapeutic ACT > 250 is confirmed ?After the dilator was pulled back into the IVC, we noticed thrombus on the Versacross wire in the right atrium. The ACT was therapeutic at about 290 seconds. The wire is removed from the body after removing the transseptal dilator and changing it out for a 24 Fr DrySeal sheath. The sheath is carefully aspirated. The septum is then crossed with the same dilator and a Safari wire is placed in the posterior aspect of the LA.  ? ?Steerable Guide Catheter Insertion ?The [facility] wire is positioned at the left upper pulmonary vein ?The femoral vein is progressively dilated and the 24 Fr Steerable guide catheter is inserted and then directed across the interatrial septum over the wire. Position is confirmed approximately 3 cm into the left atrium ?The guide is de-aired  ? ?MitraClip Insertion ?The MitraClip XTW is prepped per protocol and inserted via the introducer into the steerable guide catheter ?The Clip Delivery System (CDS) is advanced under fluoro and echo guidance so that the sleeve markers are evenly spaced on each side of the guide marker ? ?MitraClip Positioning in the Left Atrium (Supravalvular Alignment) ?M-knob is applied to bring the Clip towards the mitral valve. Echo guidance is used to avoid contact with LA structures. ?The Clip arms are opened to 180 degrees ?2D and 3D TEE imaging is performed in multiple planes and the Clip is positioned and aligned above the valve using standard  steering techniques  ? ?Entry into the Left Ventricle and Mitral Valve Leaflet Grasp ?The Clip is advanced across the mitral valve into the LV, maintaining proper orientation. The pathology is  on the medial side of P2 and the Clip is aligned directly over the pathology.  ?The Clip arms are opened to 120 degrees and the Clip is slowly retracted  ?Capture of both the anterior and posterior leaflets are visualized by echo and the grippers are dropped ? ?MitraClip Deployment ?After extensive echo evaluation, reduction in mitral regurgitation is felt to be adequate for the first clip as we suspected a 2 Clip strategy would be necessary. ?Following standard protocol, the lock line is removed after testing the lock mechanism. The lock is rechecked and is shown to be intact. The MitraClip device is deployed and the clip delivery system is removed under echo guidance with caution taken to avoid contact with LA structures ? ?Placement of Clip #2 ?MitraClip Insertion ?The MitraClip NTW is prepped per protocol and inserted via the introducer into the steerable guide catheter ?The Clip Delivery System (CDS) is advanced under fluoro and echo guidance so that the sleeve markers are evenly spaced on each side of the guide marker ? ?MitraClip Positioning in the Left Atrium (Supravalvular Alignment) ?M-knob is applied to bring the Clip towards the mitral valve. Echo guidance is used to avoid contact with LA structures. ?Low tidal volume respiration is initiated ?The Clip arms are opened to 180 degrees ?2D and 3D TEE imaging is performed in multiple planes and the Clip is positioned and aligned above the valve using standard steering techniques  ? ?Entry into the Left Ventricle and Mitral Valve Leaflet Grasp ?The Clip is advanced across the mitral valve into the LV, maintaining proper orientation and with caution taken to avoid contact with the first Clip, placing the Clip just lateral to the first Clip now near the center of the valve.  ?The Clip arms are opened further and the Clip is slowly retracted  ?Capture of both the anterior and posterior leaflets are visualized by echo and the grippers are dropped ? ?MitraClip  Deployment ?After extensive echo evaluation, reduction in mitral regurgitation is felt to be adequate ?Following standard protocol, the MitraClip device is deployed, gripper line and lock line are removed ? ?Device Removal ?The clip delivery system is removed under echo guidance ?The steerable guide catheter is retracted into the right atrium and the interatrial septum is assessed by echo without evidence of right-to-left shunting or large ASD ? ?Hemostasis ?The guide catheter is removed over a 0.035" wire and the Perclose sutures are tightened with complete hemostasis and no evidence of hematoma ? ?Estimated blood loss: minimal ? ?There are no immediate procedural complications. The patient is transferred to the post-procedure recovery area in stable condition.  ? ?Conclusion: ?Successful TEER with 2 Mitraclip devices (1 XTW and 1 NTW) both positioned A2/P2, reducing MR from 4+ to 1+ ? ?Sherren Mocha ?01/24/2022 ?2:48 PM ? ? ? ? ? ?  ?

## 2022-01-24 NOTE — Op Note (Signed)
?[facility]   ?MULTIDISCIPLINARY HEART TEAM ? ?Date of Procedure:  01/24/2022 ? ?Preoperative Diagnosis: Severe Symptomatic Mitral Regurgitation (Stage D) ? ?Postoperative Diagnosis: Same  ? ?Procedure Performed: ?Ultrasound-guided right transfemoral venous access ?Double PreClose right femoral vein ?Transseptal puncture using Bailess RF needle ?Mitral valve repair with MitraClip XTW + NTW ? ?Surgeon: Lenna Sciara, MD ? ?Echocardiographer: Renee Harder, MD ? ?Anesthesiologist: Rochele Pages, DO ? ?Device Implant: Mitraclip XTW + NTW  Serial # [id number] ? ?Procedural Indication: ?Severe Non-rheumatic Mitral Regurgitation (Stage D)  ? ?Brief History: ?The patient is a 76 year old male with history of unprovoked pulmonary embolism and DVT on chronic anticoagulation, hypertension, and hyperlipidemia who was evaluated in the outpatient setting due to severe symptomatic mitral regurgitation.  After multidisciplinary review including cardiac surgery evaluation he was thought to be best served by transcatheter edge-to-edge repair.  He is referred for this procedure today. ? ?Echo Findings: ?Preop:  ?Normal LV systolic function ?Severe degenerative MR secondary, Grade 4+ ?Post-op:  ?Unchanged LV systolic function ?1+ residual MR ? ?Procedural Details: ?Prep ?The patient is brought to the cardiac catheterization lab in the fasting state. General anesthesia is induced. The patient is prepped from the groin to chin. A foley catheter is placed. Hemodynamics are monitored via a radial artery line.  ? ?Venous Access ?Using ultrasound guidance, the right femoral vein is punctured. Ultrasound images are captured and stored in the patient's chart. The vein is dilated and 2 Perclose devices are deplyed at 10' and 2' positions to 'Preclose' the femoral vein. An 8 Fr sheath is inserted. ? ?Transseptal Puncture ?A Baylis Versacross wire is advanced into the SVC ?A Baylis transseptal dilator is advanced into  the SVC, and the VersaCross RF wire is retracted into the dilator  ?The transseptal sheath is retracted into the RA under fluoroscopic and echo guidance to obtain position on the posterior fossa where echo measurements are made to assure appropriate access to the mitral valve. Once proper position is confirmed by echo, RF energy is delivered and the VersaCross wire is advanced into the LA without resistance. The dilator and sheath are advanced over the wire where proper position is confirmed by echo and pressure measurement ?Weight based IV heparin is administered and a therapeutic ACT > 250 is confirmed ? ?Steerable Guide Catheter Insertion ?The VersaCross wire is positioned at the left upper pulmonary vein ?The femoral vein is progressively dilated and the 24 Fr Steerable guide catheter is inserted and then directed across the interatrial septum over the wire. Position is confirmed approximately 3 cm into the left atrium ?The guide is de-aired  ? ?MitraClip Insertion ?The MitraClip XTW is prepped per protocol and inserted via the introducer into the steerable guide catheter ?The Clip Delivery System (CDS) is advanced under fluoro and echo guidance so that the sleeve markers are evenly spaced on each side of the guide marker ? ?MitraClip Positioning in the Left Atrium (Supravalvular Alignment) ?M-knob is applied to bring the Clip towards the mitral valve. Echo guidance is used to avoid contact with LA structures. ?The Clip arms are opened to 180 degrees ?2D and 3D TEE imaging is performed in multiple planes and the Clip is positioned and aligned above the valve using standard steering techniques  ? ?Entry into the Left Ventricle and Mitral Valve Leaflet Grasp ?The Clip is advanced across the mitral valve into the LV, maintaining proper orientation ?The Clip arms are opened to 120 degrees and the Clip is slowly retracted  ?Capture of  both the anterior and posterior leaflets are visualized by echo and the grippers  are dropped ? ?MitraClip Deployment ?After extensive echo evaluation, reduction in mitral regurgitation is felt to be adequate ?Following standard protocol, the lock line is removed after testing the lock mechanism. The lock is rechecked and is shown to be intact. The MitraClip device is deployed and the clip delivery system is removed under echo guidance with caution taken to avoid contact with LA structures ? ?Placement of Clip #2 ?MitraClip Insertion ?The MitraClip NTW is prepped per protocol and inserted via the introducer into the steerable guide catheter ?The Clip Delivery System (CDS) is advanced under fluoro and echo guidance so that the sleeve markers are evenly spaced on each side of the guide marker ? ?MitraClip Positioning in the Left Atrium (Supravalvular Alignment) ?M-knob is applied to bring the Clip towards the mitral valve. Echo guidance is used to avoid contact with LA structures. ?Low tidal volume respiration is initiated ?The Clip arms are opened to 180 degrees ?2D and 3D TEE imaging is performed in multiple planes and the Clip is positioned and aligned above the valve using standard steering techniques  ? ?Entry into the Left Ventricle and Mitral Valve Leaflet Grasp ?The Clip is advanced across the mitral valve into the LV, maintaining proper orientation and with caution taken to avoid contact with the first Clip ?The Clip arms are opened further and the Clip is slowly retracted  ?Capture of both the anterior and posterior leaflets are visualized by echo and the grippers are dropped ? ?MitraClip Deployment ?After extensive echo evaluation, reduction in mitral regurgitation is felt to be adequate ?Following standard protocol, the MitraClip device is deployed, gripper line and lock line are removed ? ?Device Removal ?The clip delivery system is removed under echo guidance ?The steerable guide catheter is retracted into the right atrium and the interatrial septum is assessed by echo without  evidence of right-to-left shunting or large ASD ? ?Hemostasis ?The guide catheter is removed over a 0.035" wire and the Perclose sutures are tightened with complete hemostasis and no evidence of hematoma ? ?Estimated blood loss: minimal ? ?There are no immediate procedural complications. The patient is transferred to the post-procedure recovery area in stable condition.  ? ?Ricky Meyers ?01/24/2022 ?2:18 PM ? ? ? ? ? ? ? ?

## 2022-01-24 NOTE — Anesthesia Preprocedure Evaluation (Addendum)
Anesthesia Evaluation  ?Patient identified by MRN, date of birth, ID band ?Patient awake ? ? ? ?Reviewed: ?Allergy & Precautions, NPO status  ? ?Airway ?Mallampati: II ? ?TM Distance: >3 FB ?Neck ROM: Full ? ? ? Dental ?no notable dental hx. ? ?  ?Pulmonary ?sleep apnea and Continuous Positive Airway Pressure Ventilation , PE (2020) ?  ?Pulmonary exam normal ? ? ? ? ? ? ? Cardiovascular ?hypertension, Pt. on medications ?+ CAD  ?+ dysrhythmias (on Eliquis) Atrial Fibrillation + Valvular Problems/Murmurs MR  ?Rhythm:Regular Rate:Normal ?+ Systolic murmurs ? ?  ?Neuro/Psych ?Depression negative neurological ROS ?   ? GI/Hepatic ?Neg liver ROS, GERD  ,  ?Endo/Other  ?negative endocrine ROS ? Renal/GU ?negative Renal ROS  ?negative genitourinary ?  ?Musculoskeletal ? ?(+) Arthritis , Osteoarthritis,   ? Abdominal ?Normal abdominal exam  (+)   ?Peds ? Hematology ? ?(+) Blood dyscrasia, anemia ,   ?Anesthesia Other Findings ? ? Reproductive/Obstetrics ? ?  ? ? ? ? ? ? ? ? ? ? ? ? ? ?  ?  ? ? ? ? ? ? ? ?Anesthesia Physical ?Anesthesia Plan ? ?ASA: 3 ? ?Anesthesia Plan: General  ? ?Post-op Pain Management:   ? ?Induction: Intravenous ? ?PONV Risk Score and Plan: 2 and Ondansetron, Dexamethasone and Treatment may vary due to age or medical condition ? ?Airway Management Planned: Mask and Oral ETT ? ?Additional Equipment: Arterial line ? ?Intra-op Plan:  ? ?Post-operative Plan: Extubation in OR ? ?Informed Consent: I have reviewed the patients History and Physical, chart, labs and discussed the procedure including the risks, benefits and alternatives for the proposed anesthesia with the patient or authorized representative who has indicated his/her understanding and acceptance.  ? ? ? ?Dental advisory given ? ?Plan Discussed with: CRNA ? ?Anesthesia Plan Comments: (Lab Results ?     Component                Value               Date                 ?     WBC                      6.1                  01/22/2022           ?     HGB                      12.9 (L)            01/22/2022           ?     HCT                      38.8 (L)            01/22/2022           ?     MCV                      94.4                01/22/2022           ?     PLT  202                 01/22/2022           ?Lab Results ?     Component                Value               Date                 ?     NA                       135                 01/22/2022           ?     K                        4.2                 01/22/2022           ?     CO2                      21 (L)              01/22/2022           ?     GLUCOSE                  112 (H)             01/22/2022           ?     BUN                      17                  01/22/2022           ?     CREATININE               1.04                01/22/2022           ?     CALCIUM                  9.4                 01/22/2022           ?     EGFR                     81                  12/07/2021           ?     GFRNONAA                 >60                 01/22/2022           ?ECHO 02/23: ??1. Left ventricular ejection fraction, by estimation, is 60 to 65%. The  ?left ventricle has normal function. The left ventricle has no regional  ?wall motion abnormalities.  ??2. Right ventricular systolic function is normal. The right ventricular  ?size is  normal. Tricuspid regurgitation signal is inadequate for assessing  ?PA pressure.  ??3. Left atrial size was severely dilated. No left atrial/left atrial  ?appendage thrombus was detected.  ??4. Multiple ruptured mitral chordae tendinae are seen, attached to a  ?broad segment of the middle (P2) and medial (P3) scallops, but not  ?involving the medial commissure. The total flail width is almost 2 cm. The  ?maximum flail gap is approximately 5 mm.  ?There is sufficient posterior leaflet grasping length (13 mm) and the  ?leaflets are only mildly thickened. MV area by planimetery is 7 cm sq  ?(probably overestimated, but a  large valve area nonetheless). Unable to  ?obtain PISA measurements due to the  ?eccentric nature of the MR. By continuity equation, the regurgitant volume  ?is 75 ml, regurgitant fraction is 57%. MR vena contracta is 4.8 mm. The  ?mitral valve is myxomatous. Severe mitral valve regurgitation. No evidence  ?of mitral stenosis. There is  ?moderate holosystolic prolapse of both leaflets of the mitral valve.  ??5. The aortic valve is tricuspid. Aortic valve regurgitation is trivial.  ?No aortic stenosis is present.  ??6. There is mild (Grade II) plaque involving the descending aorta.  ??7. Evidence of atrial level shunting detected by color flow Doppler.  ?There is a small patent foramen ovale with predominantly left to right  ?shunting across the atrial septum. )  ? ? ? ? ? ? ?Anesthesia Quick Evaluation ? ?

## 2022-01-24 NOTE — Progress Notes (Signed)
?  [facility]   ?MULTIDISCIPLINARY HEART VALVE TEAM ? ?Patient doing well s/p TEER. He is hemodynamically stable. Groin site is stable. Plan for early ambulation after bedrest completed and hopeful discharge over the next 24-48 hours. He will have a post-clip echocardiogram prior to discharge tomorrow.  ? ?Kathyrn Drown NP-C ?Structural Heart Team  ?Pager: [phone] ?Phone: [phone] ? ?

## 2022-01-24 NOTE — Anesthesia Procedure Notes (Signed)
Arterial Line Insertion ?Start/End3/23/2023 9:00 AM, 01/24/2022 9:20 AM ?Performed by: Imagene Riches, CRNA, CRNA ? Patient location: Pre-op. ?Preanesthetic checklist: patient identified, IV checked, site marked, risks and benefits discussed, surgical consent, monitors and equipment checked, pre-op evaluation, timeout performed and anesthesia consent ?Right was placed ?Catheter size: 20 G ?Hand hygiene performed , maximum sterile barriers used  and Seldinger technique used ? ?Attempts: 2 ?Procedure performed using ultrasound guided technique. ?Ultrasound Notes:anatomy identified, needle tip was noted to be adjacent to the nerve/plexus identified and no ultrasound evidence of intravascular and/or intraneural injection ?Following insertion, dressing applied and Biopatch. ?Post procedure assessment: normal ? ?Patient tolerated the procedure well with no immediate complications. ? ? ?

## 2022-01-24 NOTE — Interval H&P Note (Signed)
History and Physical Interval Note: ? ?01/24/2022 ?10:48 AM ? ?NDREW CREASON  has presented today for surgery, with the diagnosis of severe mitral insufficiency.  The various methods of treatment have been discussed with the patient and family. After consideration of risks, benefits and other options for treatment, the patient has consented to  Procedure(s): ?MITRAL VALVE REPAIR (N/A) ?TRANSESOPHAGEAL ECHOCARDIOGRAM (TEE) (N/A) as a surgical intervention.  [name] history has been reviewed, patient examined, no change in status, stable for surgery.  I have reviewed the patient's chart and labs.  Questions were answered to the patient's satisfaction.   ? ? ?[name] ? ? ?

## 2022-01-24 NOTE — Anesthesia Procedure Notes (Signed)
Procedure Name: Intubation ?Date/Time: 01/24/2022 11:47 AM ?Performed by: Wilburn Cornelia, CRNA ?Pre-anesthesia Checklist: Patient identified, Emergency Drugs available, Suction available, Patient being monitored and Timeout performed ?Patient Re-evaluated:Patient Re-evaluated prior to induction ?Oxygen Delivery Method: Circle system utilized ?Preoxygenation: Pre-oxygenation with 100% oxygen ?Induction Type: IV induction ?Ventilation: Mask ventilation without difficulty and Oral airway inserted - appropriate to patient size ?Laryngoscope Size: Mac and 4 ?Grade View: Grade I ?Tube type: Oral ?Tube size: 7.5 mm ?Number of attempts: 1 ?Airway Equipment and Method: Stylet ?Placement Confirmation: ETT inserted through vocal cords under direct vision, positive ETCO2, CO2 detector and breath sounds checked- equal and bilateral ?Secured at: 23 cm ?Tube secured with: Tape ?Dental Injury: Teeth and Oropharynx as per pre-operative assessment  ? ? ? ? ?

## 2022-01-24 NOTE — H&P (Signed)
History and Physical: ? ?The patient is a 76 year old male with history of unprovoked pulmonary embolism and DVT on chronic anticoagulation, hypertension, and hyperlipidemia who was evaluated in the outpatient setting due to severe symptomatic mitral regurgitation with NYHA 2-3 symptoms.  After multidisciplinary review including cardiac surgery evaluation he was thought to be best served by transcatheter edge-to-edge repair.  He is referred for this procedure today. ? ?Ricky Meyers  has presented today for surgery, with the diagnosis of severe mitral insufficiency.  The various methods of treatment have been discussed with the patient and family. After consideration of risks, benefits and other options for treatment, the patient has consented to  Procedure(s): ?MITRAL VALVE REPAIR (N/A) ?TRANSESOPHAGEAL ECHOCARDIOGRAM (TEE) (N/A) as a surgical intervention.  [name] history has been reviewed, patient examined, no change in status, stable for surgery.  I have reviewed the patient's chart and labs.  Questions were answered to the patient's satisfaction.  ?

## 2022-01-25 ENCOUNTER — Encounter (HOSPITAL_COMMUNITY): Payer: Self-pay | Admitting: Internal Medicine

## 2022-01-25 ENCOUNTER — Inpatient Hospital Stay (HOSPITAL_COMMUNITY): Payer: Medicare Other

## 2022-01-25 ENCOUNTER — Other Ambulatory Visit: Payer: Self-pay | Admitting: Cardiology

## 2022-01-25 ENCOUNTER — Encounter: Payer: Medicare Other | Admitting: Thoracic Surgery (Cardiothoracic Vascular Surgery)

## 2022-01-25 DIAGNOSIS — I340 Nonrheumatic mitral (valve) insufficiency: Secondary | ICD-10-CM

## 2022-01-25 DIAGNOSIS — Z95818 Presence of other cardiac implants and grafts: Secondary | ICD-10-CM

## 2022-01-25 DIAGNOSIS — E785 Hyperlipidemia, unspecified: Secondary | ICD-10-CM | POA: Diagnosis not present

## 2022-01-25 DIAGNOSIS — Z954 Presence of other heart-valve replacement: Secondary | ICD-10-CM

## 2022-01-25 DIAGNOSIS — Z20822 Contact with and (suspected) exposure to covid-19: Secondary | ICD-10-CM | POA: Diagnosis not present

## 2022-01-25 DIAGNOSIS — Z006 Encounter for examination for normal comparison and control in clinical research program: Secondary | ICD-10-CM | POA: Diagnosis not present

## 2022-01-25 DIAGNOSIS — Z9889 Other specified postprocedural states: Secondary | ICD-10-CM | POA: Diagnosis not present

## 2022-01-25 LAB — CBC
HCT: 36.3 % — ABNORMAL LOW (ref 39.0–52.0)
Hemoglobin: 12.2 g/dL — ABNORMAL LOW (ref 13.0–17.0)
MCH: 31.4 pg (ref 26.0–34.0)
MCHC: 33.6 g/dL (ref 30.0–36.0)
MCV: 93.3 fL (ref 80.0–100.0)
Platelets: 179 10*3/uL (ref 150–400)
RBC: 3.89 MIL/uL — ABNORMAL LOW (ref 4.22–5.81)
RDW: 14.7 % (ref 11.5–15.5)
WBC: 9.8 10*3/uL (ref 4.0–10.5)
nRBC: 0 % (ref 0.0–0.2)

## 2022-01-25 LAB — BASIC METABOLIC PANEL
Anion gap: 9 (ref 5–15)
BUN: 13 mg/dL (ref 8–23)
CO2: 23 mmol/L (ref 22–32)
Calcium: 9.5 mg/dL (ref 8.9–10.3)
Chloride: 106 mmol/L (ref 98–111)
Creatinine, Ser: 1.1 mg/dL (ref 0.61–1.24)
GFR, Estimated: >60 mL/min (ref >60)
Glucose, Bld: 135 mg/dL — ABNORMAL HIGH (ref 70–99)
Potassium: 4.1 mmol/L (ref 3.5–5.1)
Sodium: 138 mmol/L (ref 135–145)

## 2022-01-25 MED ORDER — MAGNESIUM HYDROXIDE 400 MG/5ML PO SUSP
30.0000 mL | Freq: Every day | ORAL | Status: DC | PRN
Start: 2022-01-25 — End: 2022-01-25
  Administered 2022-01-25: 30 mL via ORAL
  Filled 2022-01-25: qty 30

## 2022-01-25 MED FILL — Heparin Sod (Porcine)-NaCl IV Soln 2000 Unit/L-0.9%: INTRAVENOUS | Qty: 1000 | Status: AC

## 2022-01-25 NOTE — Progress Notes (Signed)
CARDIAC REHAB PHASE I  ? ?PRE:  Rate/Rhythm: 81 SR ? ?BP:  Sitting: 121/73     ? ?SaO2: 95 RA ? ?MODE:  Ambulation: 200 ft  ? ?POST:  Rate/Rhythm: 98 SR ? ?BP:  Sitting: 144/86 ? ?  SaO2: 94 RA ? ? ?Pt assisted out of BR and ambulated 287f in hallway standby assist with front wheel walker. Pt able to maintain conversation throughout ambulation, stating breathing is much improved. Reviewed site care, restrictions, and exercise guidelines. Will refer to CRP II [place]. Pt hopeful to d/c today. ? ?[id number] ?TRufina Falco RN BSN ?01/25/2022 ?10:46 AM ? ?

## 2022-01-25 NOTE — Discharge Summary (Addendum)
?[facility]   ?MULTIDISCIPLINARY HEART VALVE TEAM ? ?Discharge Summary  ?  ?Patient ID: Ricky Meyers ?MRN: [id number]; DOB: 21-Oct-1946 ? ?Admit date: 01/24/2022 ?Discharge date: 01/25/2022 ? ?Primary Care Provider: [name], MD  ?Primary Cardiologist: Larae Grooms, MD/ Dr. Ali Lowe, MD & Dr. Burt Knack, MD (TEER) ? ?Discharge Diagnoses  ?  ?Principal Problem: ?  S/P mitral valve clip implantation ?Active Problems: ?  Hyperlipemia ?  Essential hypertension ?  LBBB (left bundle branch block) ?  Pulmonary embolus ([place]) ?  History of pulmonary embolism ?  S/P lumbar laminectomy ?  Severe mitral regurgitation ? ?Allergies ?No Known Allergies ? ?Diagnostic Studies/Procedures  ?  ?Date of Procedure:                01/24/2022 ?  ?Preoperative Diagnosis:      Severe Symptomatic Mitral Regurgitation (Stage D) ?  ?Postoperative Diagnosis:    Same  ?  ?Procedure Performed: ?Ultrasound-guided right transfemoral venous access ?Double PreClose right femoral vein ?Transseptal puncture using Bailess RF needle ?Mitral valve repair with MitraClip XTW + NTW ?  ?Surgeon: Lenna Sciara, MD ?  ?Echocardiographer: Renee Harder, MD ?  ?Anesthesiologist: Rochele Pages, DO ?  ?Device Implant: Mitraclip XTW + NTW  Serial # [id number] ?_____________ ?  ?Echo 01/25/22: Completed but pending formal read at the time of discharge  ? ?History of Present Illness   ?  ?Ricky Meyers is a 76 y.o. male with a history of mild non-obstructive coronary artery disease s/p cath 12/2021, hx of idiopathic DVT/pulmonary embolism s/p retrievable IVC filter in 11/2021 placed prior to back surgery (removed 2/10), hypertension, hyperlipidemia, GERD, OSA, prostate CA, gout, and severe mitral regurgitation who presented to [facility] on 01/24/22 for planned TEER.  ? ?He was referred to Dr. Ali Lowe after echocardiogram showed severe mitral regurgitation. He recently underwent lumbar neurosurgery without complications. Over the 6 months prior,  the patient developed dyspnea felt to be multifactorial with an element of deconditioning, body habitus, and severe degenerative mitral regurgitation all contributing. Echocardiogram showed degenerative mitral regurgitation with posterior prolapse. He was set up for [facility] which showed Mild, nonobstructive CAD. Subsequent TEE showed LVEF at 60-65% with multiple ruptured mitral chordae tendinae are seen, attached to a broad segment of the middle (P2) and medial (P3) scallops, but not involving the medial commissure.  ? ?He was evaluated by the multidisciplinary valve team and felt to have severe, symptomatic mitral regurgitation and to be a suitable candidate for TEER, which was set up for 01/24/22.  ?  ?Hospital Course  ?  ?Severe mitral regurgitation: s/p successful TEER with a MitraClip XTW and NTW using ultrasound guided right transfemoral venous access reducing MR from grade 4+ to 1+. Post operative echo pending. Groin site is are stable. The patient has been seen by [facility] and ambulated without complication. He is eager to participate in [place]. Post clip medication plan is to continue home Eliquis. He has been set up for one week TOC follow up with myself. SBE discussed and will be RX'ed at follow up next week. Post procedure site care and restrictions reviewed.  ? ?Idiopathic DVT/pulmonary embolism: s/p retrievable IVC filter in 11/2021>>removed 12/2021: No current issues. Will continue Eliquis. CBC stable with Hb at 12.2.  ? ?Recent back surgery: Eager to begin CRII and increased activity level.  ? ?Hypertension: Stable, with last BP 113/54. No changes in therapy at this time.  ? ?Hyperlipidemia: Last LDL, 71. Continue Lipitor ? ?Consultants: None  ? ?  The patient was seen and examined by Dr. Ali Lowe and felt to be stable and ready for discharge today, 01/25/22.  ?_____________ ? ?Discharge Vitals ?Blood pressure 110/64, pulse 84, temperature 98.2 ?F (36.8 ?C), temperature source Oral, resp. rate 20, height 6' (1.829  m), weight 108.4 kg, SpO2 95 %.  Danley Danker Weights  ? 01/24/22 0840  ?Weight: 108.4 kg  ? ?General: Well developed, well nourished, NAD ?Neck: Negative for carotid bruits. No JVD ?Lungs:Clear to ausculation bilaterally. No wheezes, rales, or rhonchi. Breathing is unlabored. ?Cardiovascular: RRR with S1 S2. Soft murmur ?Extremities: No edema. ?Neuro: Alert and oriented. No focal deficits. No facial asymmetry. MAE spontaneously. ?Psych: Responds to questions appropriately with normal affect.   ? ?Labs & Radiologic Studies  ?  ?CBC ?Recent Labs  ?  01/22/22 ?1430 01/25/22 ?0040  ?WBC 6.1 9.8  ?HGB 12.9* 12.2*  ?HCT 38.8* 36.3*  ?MCV 94.4 93.3  ?PLT 202 179  ? ?Basic Metabolic Panel ?Recent Labs  ?  01/22/22 ?1430 01/25/22 ?0040  ?NA 135 138  ?K 4.2 4.1  ?CL 102 106  ?CO2 21* 23  ?GLUCOSE 112* 135*  ?BUN 17 13  ?CREATININE 1.04 1.10  ?CALCIUM 9.4 9.5  ? ?Liver Function Tests ?Recent Labs  ?  01/22/22 ?1430  ?AST 28  ?ALT 24  ?ALKPHOS 59  ?BILITOT 1.1  ?PROT 6.8  ?ALBUMIN 3.9  ? ?No results for input(s): LIPASE, AMYLASE in the last 72 hours. ?Cardiac Enzymes ?No results for input(s): CKTOTAL, CKMB, CKMBINDEX, TROPONINI in the last 72 hours. ?BNP ?Invalid input(s): POCBNP ?D-Dimer ?No results for input(s): DDIMER in the last 72 hours. ?Hemoglobin A1C ?No results for input(s): HGBA1C in the last 72 hours. ?Fasting Lipid Panel ?No results for input(s): CHOL, HDL, LDLCALC, TRIG, CHOLHDL, LDLDIRECT in the last 72 hours. ?Thyroid Function Tests ?No results for input(s): TSH, T4TOTAL, T3FREE, THYROIDAB in the last 72 hours. ? ?Invalid input(s): FREET3 ?_____________  ?DG Chest 2 View ? ?Result Date: 01/25/2022 ?CLINICAL DATA:  Pre mitral valve repair evaluation. Severe mitral regurgitation. EXAM: CHEST - 2 VIEW COMPARISON:  03/30/2020 FINDINGS: Normal sized heart. Tortuous and mildly calcified thoracic aorta. Stable minimal peribronchial thickening. Clear lungs with normal vascularity. Thoracic spine degenerative changes and  cervical spine fixation hardware. IMPRESSION: 1. No acute abnormality. 2. Stable minimal chronic bronchitic changes. Electronically Signed   By: Claudie Revering M.D.   On: 01/25/2022 10:19  ? ?ECHO TEE ? ?Result Date: 01/24/2022 ?   TRANSESOPHOGEAL ECHO REPORT   Patient Name:   Ricky Meyers Date of Exam: 01/24/2022 Medical Rec #:  [id number]       Height:       72.0 in Accession #:    [id number]      Weight:       237.0 lb Date of Birth:  09-14-1946       BSA:          2.290 m? Patient Age:    35 years        BP:           124/59 mmHg Patient Gender: M               HR:           86 bpm. Exam Location:  Inpatient Procedure: Transesophageal Echo, Cardiac Doppler, Color Doppler and 3D Echo Indications:     Severe mitral insufficiency [[id number]  History:         Patient has prior history of Echocardiogram examinations, most  recent 01/01/2022. Risk Factors:Sleep Apnea, Hypertension and                  Dyslipidemia. GERD. History of Pulmonary embolism.  Sonographer:     Darlina Sicilian RDCS Referring Phys:  [id number] [name] Diagnosing Phys: Rudean Haskell MD PROCEDURE: After discussion of the risks and benefits of a TEE, an informed consent was obtained from the patient. The patient was intubated. The transesophogeal probe was passed without difficulty through the esophogus of the patient. Imaged were obtained with the patient in a supine position. Sedation performed by different physician. Image quality was good. [name] vital signs; including heart rate, blood pressure, and oxygen saturation; remained stable throughout the procedure. The patient developed no complications during the procedure. IMPRESSIONS  1. Interventional TEE for Mitra-Clip Procedure.  2. Prior to TEER procedure: The mitral valve is abnormal. There were multuple torn chords and bi-leafelet prolapse consisent with Barlow's disease. There was significant broad posterior prolapse involving the P2 and P3 segments. There was  severe eccentric mitral valve regurgitation. Posterior length 1.3 cm. Pulmonary vein blunting.  3. After wire crossing but before initiation of the guide catheter, a 3.5 cm thrombus was observed that was adhe

## 2022-01-25 NOTE — Plan of Care (Signed)
  Problem: Education: Goal: Knowledge of General Education information will improve Description: Including pain rating scale, medication(s)/side effects and non-pharmacologic comfort measures Outcome: Progressing   Problem: Health Behavior/Discharge Planning: Goal: Ability to manage health-related needs will improve Outcome: Progressing   Problem: Activity: Goal: Risk for activity intolerance will decrease Outcome: Progressing   Problem: Pain Managment: Goal: General experience of comfort will improve Outcome: Progressing   Problem: Skin Integrity: Goal: Risk for impaired skin integrity will decrease Outcome: Progressing   

## 2022-01-25 NOTE — Plan of Care (Signed)
?  Problem: Education: ?Goal: Knowledge of General Education information will improve ?Description: Including pain rating scale, medication(s)/side effects and non-pharmacologic comfort measures ?01/25/2022 1331 by Thressa Sheller, RN ?Outcome: Adequate for Discharge ?01/25/2022 1053 by Thressa Sheller, RN ?Outcome: Progressing ?  ?Problem: Health Behavior/Discharge Planning: ?Goal: Ability to manage health-related needs will improve ?01/25/2022 1331 by Thressa Sheller, RN ?Outcome: Adequate for Discharge ?01/25/2022 1053 by Thressa Sheller, RN ?Outcome: Progressing ?  ?Problem: Clinical Measurements: ?Goal: Ability to maintain clinical measurements within normal limits will improve ?Outcome: Adequate for Discharge ?Goal: Will remain free from infection ?Outcome: Adequate for Discharge ?Goal: Diagnostic test results will improve ?Outcome: Adequate for Discharge ?Goal: Respiratory complications will improve ?Outcome: Adequate for Discharge ?Goal: Cardiovascular complication will be avoided ?Outcome: Adequate for Discharge ?  ?Problem: Activity: ?Goal: Risk for activity intolerance will decrease ?01/25/2022 1331 by Thressa Sheller, RN ?Outcome: Adequate for Discharge ?01/25/2022 1053 by Thressa Sheller, RN ?Outcome: Progressing ?  ?Problem: Nutrition: ?Goal: Adequate nutrition will be maintained ?Outcome: Adequate for Discharge ?  ?Problem: Coping: ?Goal: Level of anxiety will decrease ?Outcome: Adequate for Discharge ?  ?Problem: Elimination: ?Goal: Will not experience complications related to bowel motility ?Outcome: Adequate for Discharge ?Goal: Will not experience complications related to urinary retention ?Outcome: Adequate for Discharge ?  ?Problem: Pain Managment: ?Goal: General experience of comfort will improve ?01/25/2022 1331 by Thressa Sheller, RN ?Outcome: Adequate for Discharge ?01/25/2022 1053 by Thressa Sheller, RN ?Outcome: Progressing ?  ?Problem: Safety: ?Goal: Ability to remain free  from injury will improve ?Outcome: Adequate for Discharge ?  ?Problem: Skin Integrity: ?Goal: Risk for impaired skin integrity will decrease ?01/25/2022 1331 by Thressa Sheller, RN ?Outcome: Adequate for Discharge ?01/25/2022 1053 by Thressa Sheller, RN ?Outcome: Progressing ?  ?

## 2022-01-28 ENCOUNTER — Telehealth: Payer: Self-pay

## 2022-01-28 NOTE — Telephone Encounter (Signed)
?  [facility]   ?Watchman Team ? ?Contacted the patient regarding discharge from [facility] on 01/25/2022 ? ?The patient understands to follow up with Kathyrn Drown on 02/04/2022 at 1230. ? ?The patient understands discharge instructions? Yes ? ?The patient understands medications and regimen? Yes  ? ?The patient reports he feels great and that is he breathing "so much better." He understands to call with any questions or concerns prior to scheduled visit.  ? ?

## 2022-01-30 ENCOUNTER — Telehealth (HOSPITAL_COMMUNITY): Payer: Self-pay

## 2022-01-30 NOTE — Telephone Encounter (Signed)
Pt insurance is active and benefits verified through Medicare a/b Co-pay 0, DED $226/$226 met, out of pocket 0/0 met, co-insurance 20%. no pre-authorization required. Passport, 01/30/2022_0 :48am, REF# [id number] ?  ?Will contact patient to see if he is interested in the Cardiac Rehab Program. If interested, patient will need to complete follow up appt. Once completed, patient will be contacted for scheduling upon review by the RN Navigator. ?

## 2022-01-30 NOTE — Telephone Encounter (Signed)
Recv'ed phone call from pt wife Izora Gala, who stated pt is interested. Explained scheduling process and went over insurance, she verbalized understanding. Will contact patient for scheduling once f/u has been completed.  ?

## 2022-01-30 NOTE — Telephone Encounter (Signed)
Attempted to call patient in regards to Cardiac Rehab - LM on VM 

## 2022-01-31 ENCOUNTER — Other Ambulatory Visit: Payer: Self-pay | Admitting: Family Medicine

## 2022-01-31 DIAGNOSIS — M5416 Radiculopathy, lumbar region: Secondary | ICD-10-CM | POA: Diagnosis not present

## 2022-02-01 NOTE — Progress Notes (Signed)
?[facility]   ?[place] ?                                    ?Cardiology Office Note:   ? ?Date:  02/05/2022  ? ?ID:  Ricky Meyers, DOB 06/05/1946, MRN [id number] ? ?PCP:  [name], MD  ?[facility] Cardiologist:  Larae Grooms, MD  ? ?Referring MD: [name], MD  ? ?Chief Complaint  ?Patient presents with  ? Follow-up  ?  1 week s/p TEER   ? ?History of Present Illness:   ? ?Ricky Meyers is a 76 y.o. male with a hx of mild non-obstructive coronary artery disease s/p cath 12/2021, hx of idiopathic DVT/pulmonary embolism s/p retrievable IVC filter in 11/2021 placed prior to back surgery (removed 2/10), hypertension, hyperlipidemia, GERD, OSA, prostate CA, gout, and severe mitral regurgitation who presented to [facility] on 01/24/22 for planned TEER.  ?  ?He was referred to Dr. Ali Lowe after echocardiogram showed severe mitral regurgitation. He recently underwent lumbar neurosurgery without complications. Over the 6 months prior, the patient developed dyspnea felt to be multifactorial with an element of deconditioning, body habitus, and severe degenerative mitral regurgitation all contributing. Echocardiogram showed degenerative mitral regurgitation with posterior prolapse. He was set up for [facility] which showed Mild, nonobstructive CAD. Subsequent TEE showed LVEF at 60-65% with multiple ruptured mitral chordae tendinae are seen, attached to a broad segment of the middle (P2) and medial (P3) scallops, but not involving the medial commissure.  ?  ?He was evaluated by the multidisciplinary valve team and felt to have severe, symptomatic mitral regurgitation and to be a suitable candidate for TEER, which was set up for 01/24/22.  ? ?He underwent successful TEER with a MitraClip XTW and NTW using ultrasound guided right transfemoral venous access reducing MR from grade 4+ to 1+. Post operative echo with stable clip placement x2 with XTW medially and NTW laterally. There  was mild residual MR with MVA 2.9 cm2 with mean gradient 5 mmHg at HR 76 bp. Post clip medication plan is to continue home Eliquis. SBE discussed and RX'ed today with Amoxicillin 2 g one hour prior to dental cleanings and procedures.  ? ?Today he is here with his wife an reports that he has done extremely well after clip. He reports that his SOB has improved drastically. He denies chest pain, palpitations, LE edema, orthopnea, dizziness, or syncope. He has started increasing his activity level but admits that he needs to focus on this more.  ? ?Past Medical History:  ?Diagnosis Date  ? Arthritis   ? neck and back   ? Chronic neck pain   ? Depression   ? ED (erectile dysfunction)   ? GERD (gastroesophageal reflux disease)   ? dysphagia  ? Gout   ? sees Dr. Leigh Aurora   ? Hyperlipidemia   ? Hypertension   ? OSA (obstructive sleep apnea) 12/18/2017  ? Severe with AHI at 70.1/hr and is now on CPAP at 11cm H2o  ? Prostate cancer ([place])   ? history of  ? Pulmonary embolism ([place]) 06/2019  ? S/P mitral valve clip implantation 01/24/2022  ? s/p Mitraclip XTW + NTW with Dr. Burt Knack and Dr. Ali Lowe  ? Sleep apnea   ? snoring/never checked  ? ? ?Past Surgical History:  ?Procedure Laterality Date  ? AMPUTATION Left 07/18/2018  ? Procedure: LEFT RING  FINGER REVISION AMPUTATION;  Surgeon: Leanora Cover, MD;  Location: [place];  Service: Orthopedics;  Laterality: Left;  ? BACK SURGERY    ? CERVICAL FUSION  12/02/2007  ? per Dr. Sherley Bounds  ? COLONOSCOPY  03/11/2017  ? per Dr. Ardis Hughs, clear, no repeats needed   ? injection lower back Right 09/17/2016  ? IR IVC FILTER PLMT / S&I /IMG GUID/MOD SED  11/15/2021  ? IR IVC FILTER RETRIEVAL / S&I /IMG GUID/MOD SED  12/14/2021  ? IR RADIOLOGIST EVAL & MGMT  10/30/2021  ? LUMBAR LAMINECTOMY/DECOMPRESSION MICRODISCECTOMY Left 11/21/2021  ? Procedure: Laminectomy and Foraminotomy - left - Lumbar three-four;  Surgeon: [name], MD;  Location: [place];  Service: Neurosurgery;  Laterality:  Left;  ? MITRAL VALVE REPAIR N/A 01/24/2022  ? Procedure: MITRAL VALVE REPAIR;  Surgeon: [name], MD;  Location: [place] [facility];  Service: Cardiovascular;  Laterality: N/A;  ? PROSTATECTOMY  2000  ? RIGHT/LEFT HEART CATH AND CORONARY ANGIOGRAPHY N/A 12/20/2021  ? Procedure: RIGHT/LEFT HEART CATH AND CORONARY ANGIOGRAPHY;  Surgeon: [name], MD;  Location: [place] [facility];  Service: Cardiovascular;  Laterality: N/A;  ? ROTATOR CUFF REPAIR Right 11/2017  ? TEE WITHOUT CARDIOVERSION N/A 01/01/2022  ? Procedure: TRANSESOPHAGEAL ECHOCARDIOGRAM (TEE);  Surgeon: [name], MD;  Location: [place];  Service: Cardiovascular;  Laterality: N/A;  ? TEE WITHOUT CARDIOVERSION N/A 01/24/2022  ? Procedure: TRANSESOPHAGEAL ECHOCARDIOGRAM (TEE);  Surgeon: [name], MD;  Location: [place] [facility];  Service: Cardiovascular;  Laterality: N/A;  ? TONSILLECTOMY    ? as a child  ? VASECTOMY    ? ? ?Current Medications: ?Current Meds  ?Medication Sig  ? allopurinol (ZYLOPRIM) 300 MG tablet TAKE 1 TABLET BY MOUTH EVERY DAY  ? apixaban (ELIQUIS) 5 MG TABS tablet Take 1 tablet (5 mg total) by mouth 2 (two) times daily.  ? atorvastatin (LIPITOR) 40 MG tablet TAKE 1 TABLET BY MOUTH EVERY DAY  ? cyclobenzaprine (FLEXERIL) 10 MG tablet TAKE 1 TABLET BY MOUTH 3 TIMES DAILY AS NEEDED FOR MUSCLE SPASMS (Patient taking differently: Take 10 mg by mouth 2 (two) times daily.)  ? famotidine (PEPCID) 20 MG tablet Take 20 mg by mouth daily after supper.  ? gabapentin (NEURONTIN) 100 MG capsule Take 1 capsule (100 mg total) by mouth 4 (four) times daily. (Patient taking differently: Take 200 mg by mouth 2 (two) times daily.)  ? METAMUCIL FIBER PO Take 1 Dose by mouth daily as needed (Constipation).  ? PARoxetine (PAXIL) 20 MG tablet TAKE 1 TABLET BY MOUTH EVERY MORNING  ? polyethylene glycol (MIRALAX / GLYCOLAX) 17 g packet Take 17 g by mouth daily as needed for moderate constipation.  ?  telmisartan-hydrochlorothiazide (MICARDIS HCT) 80-25 MG tablet TAKE 1 TABLET BY MOUTH EVERY DAY  ?  ? ?Allergies:   Patient has no known allergies.  ? ?Social History  ? ?Socioeconomic History  ? Marital status: Divorced  ?  Spouse name: Not on file  ? Number of children: 2  ? Years of education: Not on file  ? Highest education level: Not on file  ?Occupational History  ? Occupation: retired  ?Tobacco Use  ? Smoking status: Never  ? Smokeless tobacco: Never  ?Vaping Use  ? Vaping Use: Never used  ?Substance and Sexual Activity  ? Alcohol use: Not Currently  ?  Comment: 5 drinks/week  ? Drug use: Not Currently  ?  Comment: oxycodone is prescribed  ? Sexual  activity: Not on file  ?Other Topics Concern  ? Not on file  ?Social History Narrative  ? Not on file  ? ?Social Determinants of Health  ? ?Financial Resource Strain: Low Risk   ? Difficulty of Paying Living Expenses: Not hard at all  ?Food Insecurity: No Food Insecurity  ? Worried About Charity fundraiser in the Last Year: Never true  ? Ran Out of Food in the Last Year: Never true  ?Transportation Needs: No Transportation Needs  ? Lack of Transportation (Medical): No  ? Lack of Transportation (Non-Medical): No  ?Physical Activity: Inactive  ? Days of Exercise per Week: 0 days  ? Minutes of Exercise per Session: 0 min  ?Stress: No Stress Concern Present  ? Feeling of Stress : Not at all  ?Social Connections: Not on file  ?  ? ?Family History: ?[name] family history includes Breast cancer in his mother; Cancer in an other family member; Dementia in his sister; Diverticulitis in his mother; Heart disease in his father; Hypertension in an other family member; Prostate cancer in his father; Stroke in his father and another family member. There is no history of Colon cancer. ? ?ROS:   ?Please see the history of present illness.    ?All other systems reviewed and are negative. ? ?EKGs/Labs/Other Studies Reviewed:   ? ?The following studies were reviewed  today: ? ?Date of Procedure:                01/24/2022 ?  ?Preoperative Diagnosis:      Severe Symptomatic Mitral Regurgitation (Stage D) ?  ?Postoperative Diagnosis:    Same  ?  ?Procedure Performed: ?Ultrasound-guided right transfemo

## 2022-02-03 NOTE — Anesthesia Postprocedure Evaluation (Signed)
Anesthesia Post Note ? ?Patient: Ricky Meyers ? ?Procedure(s) Performed: MITRAL VALVE REPAIR ?TRANSESOPHAGEAL ECHOCARDIOGRAM (TEE) ? ?  ? ?Anesthesia Type: General ?Anesthetic complications: no ? ? ?There were no known notable events for this encounter. ? ?Last Vitals:  ?Vitals:  ? 01/25/22 0721 01/25/22 1105  ?BP: 107/69 110/64  ?Pulse: 81 84  ?Resp: 14 20  ?Temp: 36.8 ?C 36.8 ?C  ?SpO2: 97% 95%  ?  ?Last Pain:  ?Vitals:  ? 01/25/22 1105  ?TempSrc: Oral  ?PainSc:   ? ? ?  ?  ?  ?  ?  ?  ? ?[name] ? ? ? ? ?

## 2022-02-04 ENCOUNTER — Ambulatory Visit (INDEPENDENT_AMBULATORY_CARE_PROVIDER_SITE_OTHER): Payer: Medicare Other | Admitting: Cardiology

## 2022-02-04 VITALS — BP 102/60 | HR 88 | Ht 72.0 in | Wt 240.0 lb

## 2022-02-04 DIAGNOSIS — Z95818 Presence of other cardiac implants and grafts: Secondary | ICD-10-CM | POA: Diagnosis not present

## 2022-02-04 DIAGNOSIS — I2510 Atherosclerotic heart disease of native coronary artery without angina pectoris: Secondary | ICD-10-CM

## 2022-02-04 DIAGNOSIS — I10 Essential (primary) hypertension: Secondary | ICD-10-CM | POA: Diagnosis not present

## 2022-02-04 DIAGNOSIS — Z86711 Personal history of pulmonary embolism: Secondary | ICD-10-CM | POA: Diagnosis not present

## 2022-02-04 DIAGNOSIS — I340 Nonrheumatic mitral (valve) insufficiency: Secondary | ICD-10-CM

## 2022-02-04 DIAGNOSIS — E782 Mixed hyperlipidemia: Secondary | ICD-10-CM

## 2022-02-04 DIAGNOSIS — Z9889 Other specified postprocedural states: Secondary | ICD-10-CM | POA: Diagnosis not present

## 2022-02-04 NOTE — Patient Instructions (Signed)
Medication Instructions:  ?Your physician recommends that you continue on your current medications as directed. Please refer to the Current Medication list given to you today.  ?*If you need a refill on your cardiac medications before your next appointment, please call your pharmacy* ? ? ?Lab Work: ?NONE ?If you have labs (blood work) drawn today and your tests are completely normal, you will receive your results only by: ?MyChart Message (if you have MyChart) OR ?A paper copy in the mail ?If you have any lab test that is abnormal or we need to change your treatment, we will call you to review the results. ? ? ?Testing/Procedures: ?NONE ? ? ?Follow-Up: ?At [facility], you and your health needs are our priority.  As part of our continuing mission to provide you with exceptional heart care, we have created designated Provider Care Teams.  These Care Teams include your primary Cardiologist (physician) and Advanced Practice Providers (APPs -  Physician Assistants and Nurse Practitioners) who all work together to provide you with the care you need, when you need it. ? ?We recommend signing up for the patient portal called "MyChart".  Sign up information is provided on this After Visit Summary.  MyChart is used to connect with patients for Virtual Visits (Telemedicine).  Patients are able to view lab/test results, encounter notes, upcoming appointments, etc.  Non-urgent messages can be sent to your provider as well.   ?To learn more about what you can do with MyChart, go to [email or web address].   ? ?Your next appointment:   ?KEEP SCHEDULED FOLLOW-UP ?

## 2022-02-05 LAB — ECHOCARDIOGRAM COMPLETE
AR max vel: 2.73 cm2
AV Peak grad: 8.3 mmHg
Ao pk vel: 1.44 m/s
Area-P 1/2: 2.93 cm2
Calc EF: 56.7 %
Height: 72 in
MV M vel: 4.86 m/s
MV Peak grad: 94.5 mmHg
MV VTI: 1.87 cm2
S' Lateral: 3.2 cm
Single Plane A2C EF: 57.2 %
Single Plane A4C EF: 56.8 %
Weight: 3824 oz

## 2022-02-07 DIAGNOSIS — M5416 Radiculopathy, lumbar region: Secondary | ICD-10-CM | POA: Diagnosis not present

## 2022-02-14 DIAGNOSIS — M5416 Radiculopathy, lumbar region: Secondary | ICD-10-CM | POA: Diagnosis not present

## 2022-02-15 ENCOUNTER — Encounter: Payer: Self-pay | Admitting: Family Medicine

## 2022-02-15 ENCOUNTER — Other Ambulatory Visit: Payer: Self-pay | Admitting: Family Medicine

## 2022-02-15 ENCOUNTER — Ambulatory Visit (INDEPENDENT_AMBULATORY_CARE_PROVIDER_SITE_OTHER): Payer: Medicare Other | Admitting: Family Medicine

## 2022-02-15 VITALS — BP 128/80 | HR 80 | Temp 98.0°F | Wt 242.0 lb

## 2022-02-15 DIAGNOSIS — F1190 Opioid use, unspecified, uncomplicated: Secondary | ICD-10-CM | POA: Diagnosis not present

## 2022-02-15 DIAGNOSIS — M159 Polyosteoarthritis, unspecified: Secondary | ICD-10-CM | POA: Diagnosis not present

## 2022-02-15 DIAGNOSIS — I2510 Atherosclerotic heart disease of native coronary artery without angina pectoris: Secondary | ICD-10-CM

## 2022-02-15 MED ORDER — OXYCODONE-ACETAMINOPHEN 10-325 MG PO TABS: 1.0000 | ORAL_TABLET | Freq: Four times a day (QID) | ORAL | 0 refills | Status: DC | PRN

## 2022-02-15 NOTE — Telephone Encounter (Signed)
Last OV- 11/06/2021 ?Medication not on med list ? ?No future OV scheduled. ?

## 2022-02-15 NOTE — Progress Notes (Signed)
? ?  Subjective:  ? ? Patient ID: Ricky Meyers, male    DOB: 08/27/1946, 76 y.o.   MRN: [id number] ? ?HPI ?Here for pain management. He is doing fairly well. His back surgery was cancelled due to a heart issue. He has begun to get dry needling sessions for his back. It too early to tell if they will help or not.  ? ? ?Review of Systems  ?Constitutional: Negative.   ?Respiratory: Negative.    ?Cardiovascular: Negative.   ?Musculoskeletal:  Positive for back pain.  ? ?   ?Objective:  ? Physical Exam ?Constitutional:   ?   Appearance: Normal appearance.  ?Neurological:  ?   Mental Status: He is alert.  ? ? ? ? ? ?   ?Assessment & Plan:  ?Pain management. ?Indication for chronic opioid: OA ?Medication and dose: Percocet 10-325 ?# pills per month: 120 ?Last UDS date: 11-06-21 ?Opioid Treatment Agreement signed (Y/N): 01-15-19 ?Opioid Treatment Agreement last reviewed with patient:  02-15-22 ?N[facility]SRS reviewed this encounter (include red flags): Yes ?Meds were refilled.  ?Alysia Penna, MD ? ? ?

## 2022-02-21 DIAGNOSIS — M5416 Radiculopathy, lumbar region: Secondary | ICD-10-CM | POA: Diagnosis not present

## 2022-02-22 NOTE — Progress Notes (Signed)
?[facility]   ?[place] ?                                    ?Cardiology Office Note:   ? ?Date:  02/26/2022  ? ?ID:  Ricky Meyers, DOB June 04, 1946, MRN [id number] ? ?PCP:  [name], MD  ?[name] Cardiologist:  Larae Grooms, MD/ Dr. Ali Lowe, MD and Dr. Burt Knack, MD (TEER) ?[place] Electrophysiologist:  None  ? ?Referring MD: [name], MD  ? ?Chief Complaint  ?Patient presents with  ? Follow-up  ?  1 month s/p TEER  ? ?History of Present Illness:   ? ?Ricky Meyers is a 76 y.o. male with a hx of mild non-obstructive coronary artery disease s/p cath 12/2021, hx of idiopathic DVT/pulmonary embolism s/p retrievable IVC filter in 11/2021 placed prior to back surgery (removed 2/10), hypertension, hyperlipidemia, GERD, OSA, prostate CA, gout, and severe mitral regurgitation who presents today for 1 month f/u s/p TEER 01/24/22.  ?  ?He was referred to Dr. Ali Lowe after echocardiogram showed severe mitral regurgitation. He recently underwent lumbar neurosurgery without complications. Over the 6 months prior, the patient developed dyspnea felt to be multifactorial with an element of deconditioning, body habitus, and severe degenerative mitral regurgitation all contributing. Echocardiogram showed degenerative mitral regurgitation with posterior prolapse. He was set up for [facility] which showed Mild, nonobstructive CAD. Subsequent TEE showed LVEF at 60-65% with multiple ruptured mitral chordae tendinae are seen, attached to a broad segment of the middle (P2) and medial (P3) scallops, but not involving the medial commissure.  ?  ?He was evaluated by the multidisciplinary valve team and felt to have severe, symptomatic mitral regurgitation and to be a suitable candidate. He underwent successful TEER 01/24/22 with a MitraClip XTW and NTW using ultrasound guided right transfemoral venous access reducing MR from grade 4+ to 1+. Post operative echo with stable clip  placement x2 with XTW medially and NTW laterally. There was mild residual MR with MVA 2.9 cm2 with mean gradient 5 mmHg at HR 76 bp. Post clip medication plan was to continue home Eliquis. SBE discussed and RX'ed with Amoxicillin 2 g one hour prior to dental cleanings and procedures.  ? ?He was last seen by myself and was doing very since his procedure with improved symptoms. Today he comes alone and states that he continues to do very well with no chest pain, SOB, LE edema, palpitations, orthopnea, or DOE. He is asking about restarting an exercise program at the [facility] or [employer] which will be great. He is tolerating medications without issues. SBE discussed.  Echo today with stable Mitraclip XTW and NTW in stable position with mean gradient at 26mHg.  ? ?Past Medical History:  ?Diagnosis Date  ? Arthritis   ? neck and back   ? Chronic neck pain   ? Depression   ? ED (erectile dysfunction)   ? GERD (gastroesophageal reflux disease)   ? dysphagia  ? Gout   ? sees Dr. [name]  ? Hyperlipidemia   ? Hypertension   ? OSA (obstructive sleep apnea) 12/18/2017  ? Severe with AHI at 70.1/hr and is now on CPAP at 11cm H2o  ? [name]   ? history of  ? [name]  ? S/P mitral valve clip implantation 01/24/2022  ? s/p Mitraclip XTW + NTW with Dr.  Cooper and Dr. Ali Lowe  ? Sleep apnea   ? snoring/never checked  ? ? ?Past Surgical History:  ?Procedure Laterality Date  ? AMPUTATION Left 07/18/2018  ? Procedure: LEFT RING FINGER REVISION AMPUTATION;  Surgeon: Leanora Cover, MD;  Location: [place];  Service: Orthopedics;  Laterality: Left;  ? BACK SURGERY    ? CERVICAL FUSION  12/02/2007  ? per Dr. Sherley Bounds  ? COLONOSCOPY  03/11/2017  ? per Dr. Ardis Hughs, clear, no repeats needed   ? injection lower back Right 09/17/2016  ? IR IVC FILTER PLMT / S&I /IMG GUID/MOD SED  11/15/2021  ? IR IVC FILTER RETRIEVAL / S&I /IMG GUID/MOD SED  12/14/2021  ? IR RADIOLOGIST EVAL & MGMT  10/30/2021  ?  LUMBAR LAMINECTOMY/DECOMPRESSION MICRODISCECTOMY Left 11/21/2021  ? Procedure: Laminectomy and Foraminotomy - left - Lumbar three-four;  Surgeon: [name], MD;  Location: [place];  Service: Neurosurgery;  Laterality: Left;  ? MITRAL VALVE REPAIR N/A 01/24/2022  ? Procedure: MITRAL VALVE REPAIR;  Surgeon: [name], MD;  Location: [place] [facility];  Service: Cardiovascular;  Laterality: N/A;  ? PROSTATECTOMY  2000  ? RIGHT/LEFT HEART CATH AND CORONARY ANGIOGRAPHY N/A 12/20/2021  ? Procedure: RIGHT/LEFT HEART CATH AND CORONARY ANGIOGRAPHY;  Surgeon: [name], MD;  Location: [place] [facility];  Service: Cardiovascular;  Laterality: N/A;  ? ROTATOR CUFF REPAIR Right 11/2017  ? TEE WITHOUT CARDIOVERSION N/A 01/01/2022  ? Procedure: TRANSESOPHAGEAL ECHOCARDIOGRAM (TEE);  Surgeon: [name], MD;  Location: [place];  Service: Cardiovascular;  Laterality: N/A;  ? TEE WITHOUT CARDIOVERSION N/A 01/24/2022  ? Procedure: TRANSESOPHAGEAL ECHOCARDIOGRAM (TEE);  Surgeon: [name], MD;  Location: [place] [facility];  Service: Cardiovascular;  Laterality: N/A;  ? TONSILLECTOMY    ? as a child  ? VASECTOMY    ? ? ?Current Medications: ?Current Meds  ?Medication Sig  ? allopurinol (ZYLOPRIM) 300 MG tablet TAKE 1 TABLET BY MOUTH EVERY DAY  ? amoxicillin (AMOXIL) 500 MG capsule Take 4 capsules (2,000 mg total) by mouth as directed. Take (4) capsules 1 hour before dental work  ? apixaban (ELIQUIS) 5 MG TABS tablet Take 1 tablet (5 mg total) by mouth 2 (two) times daily.  ? atorvastatin (LIPITOR) 40 MG tablet TAKE 1 TABLET BY MOUTH EVERY DAY  ? cyclobenzaprine (FLEXERIL) 10 MG tablet TAKE 1 TABLET BY MOUTH 3 TIMES DAILY AS NEEDED FOR MUSCLE SPASMS  ? famotidine (PEPCID) 20 MG tablet Take 20 mg by mouth daily after supper.  ? gabapentin (NEURONTIN) 100 MG capsule Take 1 capsule (100 mg total) by mouth 4 (four) times daily. (Patient taking differently: Take 200 mg by mouth 2 (two) times daily.)  ?  METAMUCIL FIBER PO Take 1 Dose by mouth daily as needed (Constipation).  ? [START ON 04/17/2022] oxyCODONE-acetaminophen (PERCOCET) 10-325 MG tablet Take 1 tablet by mouth every 6 (six) hours as needed for pain.  ? PARoxetine (PAXIL) 20 MG tablet TAKE 1 TABLET BY MOUTH EVERY MORNING  ? polyethylene glycol (MIRALAX / GLYCOLAX) 17 g packet Take 17 g by mouth daily as needed for moderate constipation.  ? telmisartan-hydrochlorothiazide (MICARDIS HCT) 80-25 MG tablet TAKE 1 TABLET BY MOUTH EVERY DAY  ?  ?Allergies:   Patient has no known allergies.  ? ?Social History  ? ?Socioeconomic History  ? Marital status: Divorced  ?  Spouse name: Not on file  ? Number of children: 2  ? Years of education: Not on file  ?  Highest education level: Not on file  ?Occupational History  ? Occupation: retired  ?Tobacco Use  ? Smoking status: Never  ? Smokeless tobacco: Never  ?Vaping Use  ? Vaping Use: Never used  ?Substance and Sexual Activity  ? Alcohol use: Not Currently  ?  Comment: 5 drinks/week  ? Drug use: Not Currently  ?  Comment: oxycodone is prescribed  ? Sexual activity: Not on file  ?Other Topics Concern  ? Not on file  ?Social History Narrative  ? Not on file  ? ?Social Determinants of Health  ? ?Financial Resource Strain: Low Risk   ? Difficulty of Paying Living Expenses: Not hard at all  ?Food Insecurity: No Food Insecurity  ? Worried About Charity fundraiser in the Last Year: Never true  ? Ran Out of Food in the Last Year: Never true  ?Transportation Needs: No Transportation Needs  ? Lack of Transportation (Medical): No  ? Lack of Transportation (Non-Medical): No  ?Physical Activity: Inactive  ? Days of Exercise per Week: 0 days  ? Minutes of Exercise per Session: 0 min  ?Stress: No Stress Concern Present  ? Feeling of Stress : Not at all  ?Social Connections: Not on file  ?  ?Family History: ?[name] family history includes Breast cancer in his mother; Cancer in an other family member; Dementia in his sister;  Diverticulitis in his mother; Heart disease in his father; Hypertension in an other family member; Prostate cancer in his father; Stroke in his father and another family member. There is no history of Colon

## 2022-02-25 ENCOUNTER — Other Ambulatory Visit: Payer: Self-pay | Admitting: Cardiology

## 2022-02-25 ENCOUNTER — Ambulatory Visit (HOSPITAL_COMMUNITY): Payer: Medicare Other | Attending: Internal Medicine

## 2022-02-25 ENCOUNTER — Ambulatory Visit (INDEPENDENT_AMBULATORY_CARE_PROVIDER_SITE_OTHER): Payer: Medicare Other | Admitting: Cardiology

## 2022-02-25 VITALS — BP 130/70 | HR 80 | Ht 72.0 in | Wt 240.0 lb

## 2022-02-25 DIAGNOSIS — I340 Nonrheumatic mitral (valve) insufficiency: Secondary | ICD-10-CM | POA: Insufficient documentation

## 2022-02-25 DIAGNOSIS — Z9889 Other specified postprocedural states: Secondary | ICD-10-CM | POA: Diagnosis not present

## 2022-02-25 DIAGNOSIS — I2699 Other pulmonary embolism without acute cor pulmonale: Secondary | ICD-10-CM | POA: Diagnosis not present

## 2022-02-25 DIAGNOSIS — E782 Mixed hyperlipidemia: Secondary | ICD-10-CM | POA: Diagnosis not present

## 2022-02-25 DIAGNOSIS — Z95828 Presence of other vascular implants and grafts: Secondary | ICD-10-CM

## 2022-02-25 DIAGNOSIS — Z95818 Presence of other cardiac implants and grafts: Secondary | ICD-10-CM

## 2022-02-25 DIAGNOSIS — I2510 Atherosclerotic heart disease of native coronary artery without angina pectoris: Secondary | ICD-10-CM

## 2022-02-25 DIAGNOSIS — I10 Essential (primary) hypertension: Secondary | ICD-10-CM

## 2022-02-25 LAB — ECHOCARDIOGRAM COMPLETE
Area-P 1/2: 3.54 cm2
MV M vel: 5.21 m/s
MV Peak grad: 108.6 mmHg
MV VTI: 2 cm2
P 1/2 time: 410 msec
S' Lateral: 3.1 cm

## 2022-02-25 MED ORDER — AMOXICILLIN 500 MG PO CAPS: 2000.0000 mg | ORAL_CAPSULE | ORAL | 3 refills | Status: DC

## 2022-02-25 NOTE — Patient Instructions (Signed)
Medication Instructions:  ?The current medical regimen is effective;  continue present plan and medications. ? ?Please take antibiotic as directed before any dental work. ? ?*If you need a refill on your cardiac medications before your next appointment, please call your pharmacy* ? ? ?Lab Work: ?None  ?If you have labs (blood work) drawn today and your tests are completely normal, you will receive your results only by: ?MyChart Message (if you have MyChart) OR ?A paper copy in the mail ?If you have any lab test that is abnormal or we need to change your treatment, we will call you to review the results. ? ? ?Testing/Procedures: ?None  ? ? ?Follow-Up: ?At [facility], you and your health needs are our priority.  As part of our continuing mission to provide you with exceptional heart care, we have created designated Provider Care Teams.  These Care Teams include your primary Cardiologist (physician) and Advanced Practice Providers (APPs -  Physician Assistants and Nurse Practitioners) who all work together to provide you with the care you need, when you need it. ? ?We recommend signing up for the patient portal called "MyChart".  Sign up information is provided on this After Visit Summary.  MyChart is used to connect with patients for Virtual Visits (Telemedicine).  Patients are able to view lab/test results, encounter notes, upcoming appointments, etc.  Non-urgent messages can be sent to your provider as well.   ?To learn more about what you can do with MyChart, go to [email or web address].   ? ?Your next appointment:   ?6 month(s) ? ?The format for your next appointment:   ?In Person ? ?Provider:   ?Larae Grooms, MD   ? ? ?Thank you for choosing [facility]!! ? ? ? ?Important Information About [place] ? ? ? ? ?  ?

## 2022-02-28 DIAGNOSIS — M5416 Radiculopathy, lumbar region: Secondary | ICD-10-CM | POA: Diagnosis not present

## 2022-03-07 DIAGNOSIS — M5416 Radiculopathy, lumbar region: Secondary | ICD-10-CM | POA: Diagnosis not present

## 2022-03-14 ENCOUNTER — Other Ambulatory Visit: Payer: Self-pay | Admitting: Family Medicine

## 2022-03-14 DIAGNOSIS — M5416 Radiculopathy, lumbar region: Secondary | ICD-10-CM | POA: Diagnosis not present

## 2022-03-21 DIAGNOSIS — M5416 Radiculopathy, lumbar region: Secondary | ICD-10-CM | POA: Diagnosis not present

## 2022-03-25 ENCOUNTER — Encounter: Payer: Self-pay | Admitting: Family Medicine

## 2022-03-28 ENCOUNTER — Telehealth (HOSPITAL_COMMUNITY): Payer: Self-pay

## 2022-03-28 DIAGNOSIS — M5416 Radiculopathy, lumbar region: Secondary | ICD-10-CM | POA: Diagnosis not present

## 2022-03-28 NOTE — Telephone Encounter (Signed)
Pt insurance is active and benefits verified through Medicare a/b Co-pay 0, DED $226/$226 met, out of pocket 0/0 met, co-insurance 20%. no pre-authorization required. Passport, 03/28/2022'@9' :15am, REF# [id number]   2ndary insurance is active and benefits verified through [place]. Co-pay 0, DED 0/0 met, out of pocket 0/0 met, co-insurance 0%. No pre-authorization required. Passport, 03/28/2022'@9' :25am, REF# [id number]

## 2022-03-29 ENCOUNTER — Ambulatory Visit (INDEPENDENT_AMBULATORY_CARE_PROVIDER_SITE_OTHER): Payer: Medicare Other

## 2022-03-29 ENCOUNTER — Encounter: Payer: Self-pay | Admitting: Family Medicine

## 2022-03-29 ENCOUNTER — Ambulatory Visit (INDEPENDENT_AMBULATORY_CARE_PROVIDER_SITE_OTHER): Payer: Medicare Other | Admitting: Family Medicine

## 2022-03-29 VITALS — BP 98/64 | HR 56 | Temp 97.9°F | Wt 241.0 lb

## 2022-03-29 DIAGNOSIS — I2510 Atherosclerotic heart disease of native coronary artery without angina pectoris: Secondary | ICD-10-CM | POA: Diagnosis not present

## 2022-03-29 DIAGNOSIS — M25552 Pain in left hip: Secondary | ICD-10-CM

## 2022-03-29 NOTE — Progress Notes (Signed)
   Subjective:    Patient ID: Ricky Meyers, male    DOB: Feb 01, 1946, 76 y.o.   MRN: [id number]  HPI Here for worsening pain in the left hip. This started about 4 years ago and has gotten to the point that he can barely walk some days. He takes chronic pain medication, but this only helps to a point. He had hip Xrays in 2015 that showed moderate degenerative changes.    Review of Systems  Constitutional: Negative.   Respiratory: Negative.    Cardiovascular: Negative.   Musculoskeletal:  Positive for arthralgias and back pain.      Objective:   Physical Exam Constitutional:      Comments: He has a pronounced limp   Cardiovascular:     Rate and Rhythm: Normal rate and regular rhythm.     Pulses: Normal pulses.     Heart sounds: Normal heart sounds.  Pulmonary:     Effort: Pulmonary effort is normal.     Breath sounds: Normal breath sounds.  Musculoskeletal:     Comments: Left hip is mildly tender posteriorly. The ROM is quite limited by pain   Neurological:     Mental Status: He is alert.          Assessment & Plan:  Left hip pain, likely due to advanced OA. We will get Xrays today and refer him to Orthopedics.  Alysia Penna, MD

## 2022-04-02 ENCOUNTER — Encounter: Payer: Self-pay | Admitting: Family Medicine

## 2022-04-02 ENCOUNTER — Telehealth: Payer: Self-pay | Admitting: Pharmacist

## 2022-04-02 NOTE — Chronic Care Management (AMB) (Unsigned)
Chronic Care Management Pharmacy Assistant   Name: Ricky Meyers  MRN: [id number] DOB: Mar 30, 1946  Reason for Encounter: Disease State / Hypertension Assessment Call   Conditions to be addressed/monitored: HTN  Recent office visits:  03/29/2022 Alysia Penna MD - Patient was seen for left hip pain. Referral to Orthopedic Surgery. No medication changes. No follow up noted.   02/15/2022 Alysia Penna MD - Patient was seen for chronic narcotic use and an additional issue. Stared Percocet 10/325 mg 1 tablet every 6 hours prn. No follow up noted.   Recent consult visits:  02/25/2022 Kathyrn Drown NP (cardiology) - Patient was seen for S/P mitral valve clip implantation and additional issues. Started Amoxicillin 2000 mg prior to dental work. Follow up in 6 months.   02/21/2022 Clydell Hakim MD (neurosurgery) - Patient was seen for Body mass index [BMI] 32.0-32.9, adult and Lumbar radiculopathy. No other chart notes.   02/04/2022 Kathyrn Drown NP (cardiology) - Patient was seen for S/P mitral valve clip implantation and additional issues. No medication changes. Follow up as scheduled.   01/24/2022 Lenna Sciara MD (cardiology) - Patient was seen for a procedure, Mitral Valve Repair.   01/18/2022 Melodie Bouillon MD (cardiothoracic surgery) - Patient was seen for severe mitral regurgitation. No medication changes. No follow up noted.   01/08/2022 Eulah Pont (audiology) - Patient was seen for sensorineural hearing loss bilateral. No other chart notes.   12/28/2021 Richardson Dopp PA-C (cardiology) - Patient was seen for Severe mitral regurgitation and additional issues. No medication changes. Return for follow up with Dr. Hillery Hunter pending TEE results    Hospital visits:  Admitted to [facility] on 01/24/2022 due to S/P mitral valve clip implantation. Discharge date was 01/25/2022.   New?Medications Started at [facility] Discharge:?? No medications started Medication Changes  at Hospital Discharge: No medication changes Medications Discontinued at Hospital Discharge: No medications discontinued Medications that remain the same after Hospital Discharge:??  -All other medications will remain the same.     Admitted to [facility] on 01/01/2022 (3 hours) due to transesophageal echocardiogram.  New?Medications Started at [facility] Discharge:?? No medications started Medication Changes at Hospital Discharge: No medication changes Medications Discontinued at Hospital Discharge: No medications discontinued Medications that remain the same after Hospital Discharge:??  -All other medications will remain the same.      Admitted to [facility] on 12/20/2021 (5hours) due to Nonrhrumatic mitral valve regurgitation.    New?Medications Started at [facility] Discharge:?? No medications started Medication Changes at Hospital Discharge: No medication changes Medications Discontinued at Hospital Discharge: diazepam 5 MG tablet (VALIUM) Medications that remain the same after Hospital Discharge:??  -All other medications will remain the same.    Medications: Outpatient Encounter Medications as of 04/02/2022  Medication Sig   allopurinol (ZYLOPRIM) 300 MG tablet TAKE 1 TABLET BY MOUTH EVERY DAY   amoxicillin (AMOXIL) 500 MG capsule Take 4 capsules (2,000 mg total) by mouth as directed. Take (4) capsules 1 hour before dental work   apixaban (ELIQUIS) 5 MG TABS tablet Take 1 tablet (5 mg total) by mouth 2 (two) times daily.   atorvastatin (LIPITOR) 40 MG tablet TAKE 1 TABLET BY MOUTH EVERY DAY   cyclobenzaprine (FLEXERIL) 10 MG tablet TAKE 1 TABLET BY MOUTH 3 TIMES DAILY AS NEEDED FOR MUSCLE SPASMS   famotidine (PEPCID) 20 MG tablet Take 20 mg by mouth daily after supper.   gabapentin (NEURONTIN) 100 MG capsule Take 1 capsule (100 mg total)  by mouth 4 (four) times daily. (Patient taking differently: Take 200 mg by mouth 2 (two) times daily.)    METAMUCIL FIBER PO Take 1 Dose by mouth daily as needed (Constipation).   [START ON 04/17/2022] oxyCODONE-acetaminophen (PERCOCET) 10-325 MG tablet Take 1 tablet by mouth every 6 (six) hours as needed for pain.   PARoxetine (PAXIL) 20 MG tablet TAKE 1 TABLET BY MOUTH EVERY MORNING   polyethylene glycol (MIRALAX / GLYCOLAX) 17 g packet Take 17 g by mouth daily as needed for moderate constipation.   telmisartan-hydrochlorothiazide (MICARDIS HCT) 80-25 MG tablet TAKE 1 TABLET BY MOUTH EVERY DAY   No facility-administered encounter medications on file as of 04/02/2022.   Fill History:  allopurinol 300 mg tablet 01/21/2022 90   atorvastatin 40 mg tablet 01/21/2022 90   cyclobenzaprine 10 mg tablet 02/20/2022 30   gabapentin 100 mg capsule 03/14/2022 30   paroxetine 20 mg tablet 01/21/2022 90   telmisartan 80 mg-hydrochlorothiazide 25 mg tablet 01/21/2022 90   oxycodone-acetaminophen 10 mg-325 mg tablet 03/16/2022 30    Reviewed chart prior to disease state call. Spoke with patient regarding BP  Recent Office Vitals: BP Readings from Last 3 Encounters:  03/29/22 98/64  02/25/22 130/70  02/15/22 128/80   Pulse Readings from Last 3 Encounters:  03/29/22 (!) 56  02/25/22 80  02/15/22 80    Wt Readings from Last 3 Encounters:  03/29/22 241 lb (109.3 kg)  02/25/22 240 lb (108.9 kg)  02/15/22 242 lb (109.8 kg)     Kidney Function Lab Results  Component Value Date/Time   CREATININE 1.10 01/25/2022 12:40 AM   CREATININE 1.04 01/22/2022 02:30 PM   CREATININE 1.20 (H) 08/22/2020 12:08 PM   GFR 55.52 (L) 08/23/2021 09:40 AM   GFRNONAA >60 01/25/2022 12:40 AM   GFRAA >60 06/17/2019 06:05 AM       Latest Ref Rng & Units 01/25/2022   12:40 AM 01/22/2022    2:30 PM 12/20/2021    8:11 AM  BMP  Glucose 70 - 99 mg/dL 135   112     BUN 8 - 23 mg/dL 13   17     Creatinine 0.61 - 1.24 mg/dL 1.10   1.04     Sodium 135 - 145 mmol/L 138   135   140    Potassium 3.5 - 5.1 mmol/L 4.1   4.2    4.0    Chloride 98 - 111 mmol/L 106   102     CO2 22 - 32 mmol/L 23   21     Calcium 8.9 - 10.3 mg/dL 9.5   9.4       Current antihypertensive regimen:  Micardis HCT 80/25 mg daily  How often are you checking your Blood Pressure? {CHL HP BP Monitoring Frequency:[phone]}  Current home BP readings: ***  What recent interventions/DTPs have been made by any provider to improve Blood Pressure control since last CPP Visit: Patient has had a S/P mitral valve clip implantation.  Any recent hospitalizations or ED visits since last visit with CPP? Patient has had recent hospital visits/procedures.   What diet changes have been made to improve Blood Pressure Control?  ***  What exercise is being done to improve your Blood Pressure Control?  ***  Adherence Review: Is the patient currently on ACE/ARB medication? Yes Does the patient have >5 day gap between last estimated fill dates? No  Care Gaps: AWV - scheduled 11/18/2022 Last BP - 98/64 on 03/29/2022 Covid booster -  overdue Hep C Screen - postponed  Star Rating Drugs: Atrovastatin 40 mg - last filled 01/21/2022 90 DS at [facility]. Telmisartan HCTZ 80/25 mg - last filled 01/21/2022 90 DS at [place] [facility] Pharmacist Assistant [phone]

## 2022-04-04 ENCOUNTER — Telehealth (HOSPITAL_COMMUNITY): Payer: Self-pay

## 2022-04-04 ENCOUNTER — Telehealth (HOSPITAL_COMMUNITY): Payer: Self-pay | Admitting: *Deleted

## 2022-04-04 ENCOUNTER — Encounter (HOSPITAL_COMMUNITY): Payer: Self-pay

## 2022-04-04 DIAGNOSIS — M1612 Unilateral primary osteoarthritis, left hip: Secondary | ICD-10-CM | POA: Diagnosis not present

## 2022-04-04 DIAGNOSIS — M25552 Pain in left hip: Secondary | ICD-10-CM | POA: Diagnosis not present

## 2022-04-04 DIAGNOSIS — M7918 Myalgia, other site: Secondary | ICD-10-CM | POA: Diagnosis not present

## 2022-04-04 NOTE — Telephone Encounter (Signed)
Pt wife returned call from message left on yesterday. Pt was contacted for scheduling cardiac rehab. In speaking with Izora Gala, who is listed on his DPR, pt received steroid injection  to his hip on yesterday.  Per wife, pt may need hip replacement for "bone on bone" Unsure if the injection will work and he could do cardiac rehab( individualized to his ability) will hold off on scheduling and regroup after he completes his follow up with the orthopedic surgeon - Dr. Berenice Primas on 6/15 to determine next steps. Cherre Huger, BSN Cardiac and Training and development officer

## 2022-04-04 NOTE — Telephone Encounter (Signed)
Attempted to call patient in regards to Cardiac Rehab - LM on VM Mailed letter 

## 2022-04-17 ENCOUNTER — Other Ambulatory Visit: Payer: Self-pay | Admitting: Family Medicine

## 2022-04-17 ENCOUNTER — Other Ambulatory Visit: Payer: Self-pay | Admitting: Internal Medicine

## 2022-04-18 DIAGNOSIS — M1612 Unilateral primary osteoarthritis, left hip: Secondary | ICD-10-CM | POA: Diagnosis not present

## 2022-04-19 ENCOUNTER — Telehealth (HOSPITAL_COMMUNITY): Payer: Self-pay | Admitting: *Deleted

## 2022-04-19 NOTE — Telephone Encounter (Signed)
Pt completed follow up appt on 6/15 with the orthopedist.  Unable to review notes in [place].  Called and spoke to pt regarding this app and plan of care.  Informed that he will be scheduled for hip replacement.  The date is to be determined. Pt informed that recovery time is about 4-[redacted] weeks along with 3 additional weeks for complete recovery and therapy.  Will hold off on cardiac rehab until ok to proceed once cleared to do so.  Asked pt to please contact us with the date of this procedure so that we can continue to follow along.  Ppwk in my medical hold bin. Cherre Huger, BSN Cardiac and Training and development officer

## 2022-04-23 ENCOUNTER — Encounter: Payer: Self-pay | Admitting: Family Medicine

## 2022-04-24 NOTE — Telephone Encounter (Signed)
Please set up a 30 minute surgical clearance visit so we can talk about these issues

## 2022-04-25 ENCOUNTER — Encounter: Payer: Self-pay | Admitting: Family Medicine

## 2022-04-25 ENCOUNTER — Other Ambulatory Visit: Payer: Self-pay | Admitting: Family Medicine

## 2022-04-25 ENCOUNTER — Ambulatory Visit (INDEPENDENT_AMBULATORY_CARE_PROVIDER_SITE_OTHER): Payer: Medicare Other | Admitting: Family Medicine

## 2022-04-25 VITALS — BP 120/80 | HR 89 | Temp 98.9°F | Wt 243.0 lb

## 2022-04-25 DIAGNOSIS — G8929 Other chronic pain: Secondary | ICD-10-CM

## 2022-04-25 DIAGNOSIS — I10 Essential (primary) hypertension: Secondary | ICD-10-CM

## 2022-04-25 DIAGNOSIS — I2510 Atherosclerotic heart disease of native coronary artery without angina pectoris: Secondary | ICD-10-CM | POA: Diagnosis not present

## 2022-04-25 DIAGNOSIS — M25552 Pain in left hip: Secondary | ICD-10-CM

## 2022-04-25 DIAGNOSIS — Z86711 Personal history of pulmonary embolism: Secondary | ICD-10-CM

## 2022-04-25 MED ORDER — FAMOTIDINE 20 MG PO TABS: 20.0000 mg | ORAL_TABLET | Freq: Every day | ORAL | 3 refills | Status: AC

## 2022-04-25 MED ORDER — ENOXAPARIN SODIUM 100 MG/ML IJ SOSY: PREFILLED_SYRINGE | INTRAMUSCULAR | 0 refills | Status: DC

## 2022-04-25 NOTE — Progress Notes (Signed)
   Subjective:    Patient ID: Ricky Meyers, male    DOB: [birth year], 76 y.o.   MRN: [id number]  HPI Here for a surgical clearance exam. He feels fine except for his hip pain. He is scheduled to have a left total hip arthroplasty per Dr. Dorna Leitz on 05-27-22. His BP has been stable. He has done well after the mitral valve repair he had performed,  and his last ECHO on 02-25-22 showed the mitral valve to be functioning adequately. His LVEF was 60%. He takes Eliquis, and he will be on lifetime anticoagulation due to a hx of pulmonary emboli.    Review of Systems  Constitutional: Negative.   Respiratory: Negative.    Cardiovascular: Negative.   Gastrointestinal: Negative.   Genitourinary: Negative.   Musculoskeletal:  Positive for arthralgias.  Neurological: Negative.        Objective:   Physical Exam Constitutional:      Appearance: Normal appearance.     Comments: Walks with a limp   Cardiovascular:     Rate and Rhythm: Normal rate and regular rhythm.     Pulses: Normal pulses.     Heart sounds: Normal heart sounds.  Pulmonary:     Effort: Pulmonary effort is normal.     Breath sounds: Normal breath sounds.  Musculoskeletal:     Right lower leg: No edema.     Left lower leg: No edema.  Neurological:     General: No focal deficit present.     Mental Status: He is alert and oriented to person, place, and time.           Assessment & Plan:  He is cleared for his hip replacement surgery, but I suggest he be put on a Lovenox bridge. He will stop taking the Eliquis 7 days prior to the surgery, and he may resume taking the Eliquis the day after surgery. During the 7 days prior to the surgery he will take Eliquis shots BID. He will take the last Lovenox shot the evening before the surgery. He will follow up with [place] as needed. We spent a total of ( 35  ) minutes reviewing records and discussing these issues.  Alysia Penna, MD

## 2022-04-25 NOTE — Addendum Note (Signed)
Addended by: Alysia Penna A on: 04/25/2022 10:50 AM   Modules accepted: Orders

## 2022-04-30 ENCOUNTER — Telehealth: Payer: Self-pay | Admitting: Interventional Cardiology

## 2022-04-30 ENCOUNTER — Other Ambulatory Visit: Payer: Self-pay | Admitting: Orthopedic Surgery

## 2022-04-30 ENCOUNTER — Telehealth: Payer: Self-pay | Admitting: *Deleted

## 2022-05-02 ENCOUNTER — Ambulatory Visit (INDEPENDENT_AMBULATORY_CARE_PROVIDER_SITE_OTHER): Payer: Medicare Other | Admitting: Physician Assistant

## 2022-05-02 DIAGNOSIS — Z01810 Encounter for preprocedural cardiovascular examination: Secondary | ICD-10-CM | POA: Diagnosis not present

## 2022-05-02 DIAGNOSIS — M25652 Stiffness of left hip, not elsewhere classified: Secondary | ICD-10-CM | POA: Diagnosis not present

## 2022-05-02 DIAGNOSIS — R262 Difficulty in walking, not elsewhere classified: Secondary | ICD-10-CM | POA: Diagnosis not present

## 2022-05-02 DIAGNOSIS — M1612 Unilateral primary osteoarthritis, left hip: Secondary | ICD-10-CM | POA: Diagnosis not present

## 2022-05-02 NOTE — Progress Notes (Addendum)
Virtual Visit via Telephone Note   Because of Ricky Meyers's co-morbid illnesses, he is at least at moderate risk for complications without adequate follow up.  This format is felt to be most appropriate for this patient at this time.  The patient did not have access to video technology/had technical difficulties with video requiring transitioning to audio format only (telephone).  All issues noted in this document were discussed and addressed.  No physical exam could be performed with this format.  Please refer to the patient's chart for his consent to telehealth for [facility].  Evaluation Performed:  Preoperative cardiovascular risk assessment _____________   Date:  05/02/2022   Patient ID:  Ricky Meyers, Ricky Meyers [birth year], MRN [id number] Patient Location:  Home Provider location:   Office  Primary Care Provider:  [name], MD Primary Cardiologist:  Larae Grooms, MD  Chief Complaint / Patient Profile   76 y.o. y/o male with a h/o mild nonobstructive coronary artery disease status post cath 12/2021, history of idiopathic DVT/pulmonary embolism status post retrievable IVC filter in 11/2021 placed prior to back surgery (removed 2/10), hypertension, hyperlipidemia, GERD, OSA, prostate cancer, gout, and severe mitral regurgitation status post TEER 01/24/2022 who is pending left anterior hip arthroplasty on 05/27/2022 and presents today for telephonic preoperative cardiovascular risk assessment.  Past Medical History    Past Medical History:  Diagnosis Date   Arthritis    neck and back    Chronic neck pain    Depression    ED (erectile dysfunction)    GERD (gastroesophageal reflux disease)    dysphagia   Gout    sees Dr. Leigh Aurora    Hyperlipidemia    Hypertension    OSA (obstructive sleep apnea) 12/18/2017   Severe with AHI at 70.1/hr and is now on CPAP at 11cm H2o   Prostate cancer [facility])    history of   Pulmonary embolism ([place]) 06/2019   S/P mitral valve  clip implantation 01/24/2022   s/p Mitraclip XTW + NTW with Dr. Burt Knack and Dr. Ali Lowe   Sleep apnea    snoring/never checked   Past Surgical History:  Procedure Laterality Date   AMPUTATION Left 07/18/2018   Procedure: LEFT RING FINGER REVISION AMPUTATION;  Surgeon: Leanora Cover, MD;  Location: [place];  Service: Orthopedics;  Laterality: Left;   BACK SURGERY     CERVICAL FUSION  12/02/2007   per Dr. Sherley Bounds   COLONOSCOPY  03/11/2017   per Dr. Ardis Hughs, clear, no repeats needed    injection lower back Right 09/17/2016   IR IVC FILTER PLMT / S&I /IMG GUID/MOD SED  11/15/2021   IR IVC FILTER RETRIEVAL / S&I /IMG GUID/MOD SED  12/14/2021   IR RADIOLOGIST EVAL & MGMT  10/30/2021   LUMBAR LAMINECTOMY/DECOMPRESSION MICRODISCECTOMY Left 11/21/2021   Procedure: Laminectomy and Foraminotomy - left - Lumbar three-four;  Surgeon: [name], MD;  Location: [place];  Service: Neurosurgery;  Laterality: Left;   MITRAL VALVE REPAIR N/A 01/24/2022   Procedure: MITRAL VALVE REPAIR;  Surgeon: [name], MD;  Location: [place] [facility];  Service: Cardiovascular;  Laterality: N/A;   PROSTATECTOMY  2000   RIGHT/LEFT HEART CATH AND CORONARY ANGIOGRAPHY N/A 12/20/2021   Procedure: RIGHT/LEFT HEART CATH AND CORONARY ANGIOGRAPHY;  Surgeon: [name], MD;  Location: [place] [facility];  Service: Cardiovascular;  Laterality: N/A;   ROTATOR CUFF REPAIR Right 11/2017   TEE WITHOUT CARDIOVERSION N/A 01/01/2022   Procedure: TRANSESOPHAGEAL  ECHOCARDIOGRAM (TEE);  Surgeon: [name], MD;  Location: [place];  Service: Cardiovascular;  Laterality: N/A;   TEE WITHOUT CARDIOVERSION N/A 01/24/2022   Procedure: TRANSESOPHAGEAL ECHOCARDIOGRAM (TEE);  Surgeon: [name], MD;  Location: [place] [facility];  Service: Cardiovascular;  Laterality: N/A;   TONSILLECTOMY     as a child   VASECTOMY      Allergies  No Known Allergies  History of Present Illness    Ricky Meyers is a  76 y.o. male who presents via audio/video conferencing for a telehealth visit today.  Pt was last seen in cardiology clinic on 02/25/22 by Kathyrn Drown, NP.  At that time SOLOMON SKOWRONEK was doing well .  The patient is now pending procedure as outlined above. Since his last visit, he has been doing well from a cardiac standpoint.  His activity level has been somewhat limited due to his hip.  He is able to walk around his house and do mild to moderate household chores.  He also is able to walk but requires rest due to his hip not due to his heart.  The same scenario goes for going up and down stairs.  He is limited due to his hip and not his cardiovascular health.  He has not done a whole lot of activity since his appointment back in April and he has been afraid to get his heart rate up since his valve surgery.  We discussed slowly increasing his activity level.  He does plan to get involved cardiac rehab following his hip replacement at the end of July.  He is able to mow his lawn on his riding mower but tries to avoid weeding or raking at this time.  Reports no shortness of breath nor dyspnea on exertion. Reports no chest pain, pressure, or tightness. No edema, orthopnea, PND. Reports no palpitations.    PCP to send recommendations regarding holding Eliquis since he takes medication for history of DVT/PE.  He tells me today that he met with his PCP recently and he was advised to hold Eliquis 7 days prior to his procedure and he will be on Lovenox injections during that time.   Home Medications    Prior to Admission medications   Medication Sig Start Date End Date Taking? Authorizing Provider  allopurinol (ZYLOPRIM) 300 MG tablet TAKE 1 TABLET BY MOUTH EVERY DAY 04/17/22   [name], MD  amoxicillin (AMOXIL) 500 MG capsule Take 4 capsules (2,000 mg total) by mouth as directed. Take (4) capsules 1 hour before dental work 02/25/22   Burnell Blanks, MD  apixaban (ELIQUIS) 5 MG TABS tablet Take  1 tablet (5 mg total) by mouth 2 (two) times daily. 12/21/21   [name], MD  atorvastatin (LIPITOR) 40 MG tablet TAKE 1 TABLET BY MOUTH EVERY DAY 04/17/22   [name], MD  cyclobenzaprine (FLEXERIL) 10 MG tablet TAKE 1 TABLET BY MOUTH 3 TIMES DAILY AS NEEDED FOR MUSCLE SPASMS 10/22/21   [name], MD  enoxaparin (LOVENOX) 100 MG/ML injection Take one injection twice daily beginning 7 days before surgery and ending the evening before surgery 04/25/22   [name], MD  famotidine (PEPCID) 20 MG tablet Take 1 tablet (20 mg total) by mouth daily after supper. 04/25/22   [name], MD  gabapentin (NEURONTIN) 100 MG capsule TAKE 1 CAPSULE BY MOUTH 4 TIMES DAILY 04/18/22   [name], MD  METAMUCIL FIBER PO Take 1 Dose  by mouth daily as needed (Constipation).    [provider]  oxyCODONE-acetaminophen (PERCOCET) 10-325 MG tablet Take 1 tablet by mouth every 6 (six) hours as needed for pain. 04/17/22 05/17/22  [name], MD  PARoxetine (PAXIL) 20 MG tablet TAKE 1 TABLET BY MOUTH EVERY MORNING 03/14/22   [name], MD  polyethylene glycol (MIRALAX / GLYCOLAX) 17 g packet Take 17 g by mouth daily as needed for moderate constipation.    [provider]  telmisartan-hydrochlorothiazide (MICARDIS HCT) 80-25 MG tablet TAKE 1 TABLET BY MOUTH EVERY DAY 07/02/21   Tanda Rockers, MD    Physical Exam    Vital Signs:  ORACIO GALEN does not have vital signs available for review today.  Given telephonic nature of communication, physical exam is limited. AAOx3. NAD. Normal affect.  Speech and respirations are unlabored.  Accessory Clinical Findings    None  Assessment & Plan    1.  Preoperative Cardiovascular Risk Assessment:  Mr. Shostak perioperative risk of a major cardiac event is 0.9% according to the Revised Cardiac Risk Index (RCRI).  Therefore, he is at low risk for perioperative complications.   His functional capacity is fair at 4.06 METs  according to the Duke Activity Status Index (DASI). Recommendations: According to ACC/AHA guidelines, no further cardiovascular testing needed.  The patient may proceed to surgery at acceptable risk.   Anticoagulation recommendations will come from PCP.  A copy of this note will be routed to requesting surgeon.  Time:   Today, I have spent 15 minutes with the patient with telehealth technology discussing medical history, symptoms, and management plan.     Elgie Collard, PA-C  05/02/2022, 10:34 AM

## 2022-05-08 ENCOUNTER — Telehealth: Payer: Self-pay | Admitting: Internal Medicine

## 2022-05-08 NOTE — Telephone Encounter (Signed)
Fax received from Dr. Dorna Leitz with [facility] to perform a Left total hip arthroplasty on patient.  Patient needs surgery clearance. Surgery is 05/27/2022. Patient was seen on 06/05/2021. Office protocol is a risk assessment can be sent to surgeon if patient has been seen in 60 days or less.   Sending to Dr. Melvyn Novas for risk assessment or recommendations if patient needs to be seen in office prior to surgical procedure.

## 2022-05-08 NOTE — Telephone Encounter (Signed)
I have not seen in almost a year so needs ov for clearance

## 2022-05-13 ENCOUNTER — Telehealth (INDEPENDENT_AMBULATORY_CARE_PROVIDER_SITE_OTHER): Payer: Medicare Other | Admitting: Family Medicine

## 2022-05-13 ENCOUNTER — Encounter: Payer: Self-pay | Admitting: Family Medicine

## 2022-05-13 DIAGNOSIS — F1190 Opioid use, unspecified, uncomplicated: Secondary | ICD-10-CM

## 2022-05-13 DIAGNOSIS — M159 Polyosteoarthritis, unspecified: Secondary | ICD-10-CM

## 2022-05-13 DIAGNOSIS — I2510 Atherosclerotic heart disease of native coronary artery without angina pectoris: Secondary | ICD-10-CM | POA: Diagnosis not present

## 2022-05-13 MED ORDER — OXYCODONE-ACETAMINOPHEN 10-325 MG PO TABS: 1.0000 | ORAL_TABLET | Freq: Four times a day (QID) | ORAL | 0 refills | Status: DC | PRN

## 2022-05-13 NOTE — Care Plan (Signed)
Ortho Bundle Case Management Note  Patient Details  Name: Ricky Meyers MRN: [id number] Date of Birth: Dec 02, 1945   Spoke with patient prior to surgery. He will discharge to home with family to assist. Rolling walker ordered for home use. OPPT set up with [place]. Patient and MD in agreement with plan. Choice offered                     DME Arranged:  Walker rolling DME Agency:  [employer]  HH Arranged:    [place] Agency:     Additional Comments: Please contact me with any questions of if this plan should need to change.  Ladell Heads,  [place] Specialist  [phone] 05/13/2022, 11:01 AM

## 2022-05-13 NOTE — Progress Notes (Signed)
   Subjective:    Patient ID: Ricky Meyers, male    DOB: 1946-10-03, 76 y.o.   MRN: [id number]  HPI Virtual Visit via Telephone Note  I connected with the patient on 05/13/22 at 10:45 AM EDT by telephone and verified that I am speaking with the correct person using two identifiers.   I discussed the limitations, risks, security and privacy concerns of performing an evaluation and management service by telephone and the availability of in person appointments. I also discussed with the patient that there may be a patient responsible charge related to this service. The patient expressed understanding and agreed to proceed.  Location patient: home Location provider: work or home office Participants present for the call: patient, provider Patient did not have a visit in the prior 7 days to address this/these issue(s).   History of Present Illness: Here for pain management. His main source of pain now is the left hip. He is scheduled for replacement surgery in the next few weeks.    Observations/Objective: Patient sounds cheerful and well on the phone. I do not appreciate any SOB. Speech and thought processing are grossly intact. Patient reported vitals:  Assessment and Plan: Pain management. Indication for chronic opioid: OA Medication and dose: Percocet 10-325 # pills per month: 120 Last UDS date: 11-06-21 Opioid Treatment Agreement signed (Y/N): 01-15-19 Opioid Treatment Agreement last reviewed with patient:  05-13-22 [facility] reviewed this encounter (include red flags): Yes Meds were refilled.  Alysia Penna, MD   Follow Up Instructions:     [phone] 5-10 [phone] 11-20 [id number] 21-30 I did not refer this patient for an OV in the next 24 hours for this/these issue(s).  I discussed the assessment and treatment plan with the patient. The patient was provided an opportunity to ask questions and all were answered. The patient agreed with the plan and demonstrated an understanding of the  instructions.   The patient was advised to call back or seek an in-person evaluation if the symptoms worsen or if the condition fails to improve as anticipated.  I provided 19 minutes of non-face-to-face time during this encounter.   Alysia Penna, MD     Review of Systems     Objective:   Physical Exam        Assessment & Plan:

## 2022-05-14 ENCOUNTER — Other Ambulatory Visit: Payer: Self-pay | Admitting: Family Medicine

## 2022-05-14 NOTE — Patient Instructions (Addendum)
DUE TO COVID-19 ONLY TWO VISITORS  (aged 78 and older)  ARE ALLOWED TO COME WITH YOU AND STAY IN THE WAITING ROOM ONLY DURING PRE OP AND PROCEDURE.   **NO VISITORS ARE ALLOWED IN THE SHORT STAY AREA OR RECOVERY ROOM!!**  IF YOU WILL BE ADMITTED INTO THE HOSPITAL YOU ARE ALLOWED ONLY FOUR SUPPORT PEOPLE DURING VISITATION HOURS ONLY (7 AM -8PM)   The support person(s) must pass our screening, gel in and out, and wear a mask at all times, including in the patient's room. Patients must also wear a mask when staff or their support person are in the room. Visitors GUEST BADGE MUST BE WORN VISIBLY  One adult visitor may remain with you overnight and MUST be in the room by 8 P.M.     Your procedure is scheduled on: 05/27/22   Report to [facility] Main Entrance    Report to admitting at : 7:30 AM   Call this number if you have problems the morning of surgery [phone]   Do not eat food :After Midnight.   After Midnight you may have the following liquids until : 7:00 AM DAY OF SURGERY  Water Black Coffee (sugar ok, NO MILK/CREAM OR CREAMERS)  Tea (sugar ok, NO MILK/CREAM OR CREAMERS) regular and decaf                             Plain Jell-O (NO RED)                                           Fruit ices (not with fruit pulp, NO RED)                                     Popsicles (NO RED)                                                                  Juice: apple, WHITE grape, WHITE cranberry Sports drinks like Gatorade (NO RED) Clear broth(vegetable,chicken,beef)  Drink  Ensure drink AT : 7:00 AM the day of surgery.     The day of surgery:  Drink ONE (1) Pre-Surgery Clear Ensure or G2 at AM the morning of surgery. Drink in one sitting. Do not sip.  This drink was given to you during your hospital  pre-op appointment visit. Nothing else to drink after completing the  Pre-Surgery Clear Ensure or G2.          If you have questions, please contact your surgeon's office.     Oral Hygiene is also important to reduce your risk of infection.                                    Remember - BRUSH YOUR TEETH THE MORNING OF SURGERY WITH YOUR REGULAR TOOTHPASTE   Do NOT smoke after Midnight   Take these medicines the morning of surgery with A SIP OF WATER: gabapentin,paroxetine,allopurinol,famotidine.  DO NOT TAKE ANY  ORAL DIABETIC MEDICATIONS DAY OF YOUR SURGERY  Bring CPAP mask and tubing day of surgery.                              You may not have any metal on your body including hair pins, jewelry, and body piercing             Do not wear lotions, powders, perfumes/cologne, or deodorant              Men may shave face and neck.   Do not bring valuables to the hospital. [place].   Contacts, dentures or bridgework may not be worn into surgery.   Bring small overnight bag day of surgery.   DO NOT [place]. PHARMACY WILL DISPENSE MEDICATIONS LISTED ON YOUR MEDICATION LIST TO YOU DURING YOUR ADMISSION [place]!    Patients discharged on the day of surgery will not be allowed to drive home.  Someone NEEDS to stay with you for the first 24 hours after anesthesia.   Special Instructions: Bring a copy of your healthcare power of attorney and living will documents         the day of surgery if you haven't scanned them before.              Please read over the following fact sheets you were given: IF YOU HAVE QUESTIONS ABOUT YOUR PRE-OP INSTRUCTIONS PLEASE CALL [phone]     [facility] - Preparing for Surgery Before surgery, you can play an important role.  Because skin is not sterile, your skin needs to be as free of germs as possible.  You can reduce the number of germs on your skin by washing with CHG (chlorahexidine gluconate) soap before surgery.  CHG is an antiseptic cleaner which kills germs and bonds with the skin to continue killing germs even after  washing. Please DO NOT use if you have an allergy to CHG or antibacterial soaps.  If your skin becomes reddened/irritated stop using the CHG and inform your nurse when you arrive at Short Stay. Do not shave (including legs and underarms) for at least 48 hours prior to the first CHG shower.  You may shave your face/neck. Please follow these instructions carefully:  1.  Shower with CHG Soap the night before surgery and the  morning of Surgery.  2.  If you choose to wash your hair, wash your hair first as usual with your  normal  shampoo.  3.  After you shampoo, rinse your hair and body thoroughly to remove the  shampoo.                           4.  Use CHG as you would any other liquid soap.  You can apply chg directly  to the skin and wash                       Gently with a scrungie or clean washcloth.  5.  Apply the CHG Soap to your body ONLY FROM THE NECK DOWN.   Do not use on face/ open  Wound or open sores. Avoid contact with eyes, ears mouth and genitals (private parts).                       Wash face,  Genitals (private parts) with your normal soap.             6.  Wash thoroughly, paying special attention to the area where your surgery  will be performed.  7.  Thoroughly rinse your body with warm water from the neck down.  8.  DO NOT shower/wash with your normal soap after using and rinsing off  the CHG Soap.                9.  Pat yourself dry with a clean towel.            10.  Wear clean pajamas.            11.  Place clean sheets on your bed the night of your first shower and do not  sleep with pets. Day of Surgery : Do not apply any lotions/deodorants the morning of surgery.  Please wear clean clothes to the hospital/surgery center.  FAILURE TO FOLLOW THESE INSTRUCTIONS MAY RESULT IN THE CANCELLATION OF YOUR SURGERY PATIENT SIGNATURE_________________________________  NURSE  SIGNATURE__________________________________  ________________________________________________________________________   Adam Phenix  An incentive spirometer is a tool that can help keep your lungs clear and active. This tool measures how well you are filling your lungs with each breath. Taking long deep breaths may help reverse or decrease the chance of developing breathing (pulmonary) problems (especially infection) following: A long period of time when you are unable to move or be active. BEFORE THE PROCEDURE  If the spirometer includes an indicator to show your best effort, your nurse or respiratory therapist will set it to a desired goal. If possible, sit up straight or lean slightly forward. Try not to slouch. Hold the incentive spirometer in an upright position. INSTRUCTIONS FOR USE  Sit on the edge of your bed if possible, or sit up as far as you can in bed or on a chair. Hold the incentive spirometer in an upright position. Breathe out normally. Place the mouthpiece in your mouth and seal your lips tightly around it. Breathe in slowly and as deeply as possible, raising the piston or the ball toward the top of the column. Hold your breath for 3-5 seconds or for as long as possible. Allow the piston or ball to fall to the bottom of the column. Remove the mouthpiece from your mouth and breathe out normally. Rest for a few seconds and repeat Steps 1 through 7 at least 10 times every 1-2 hours when you are awake. Take your time and take a few normal breaths between deep breaths. The spirometer may include an indicator to show your best effort. Use the indicator as a goal to work toward during each repetition. After each set of 10 deep breaths, practice coughing to be sure your lungs are clear. If you have an incision (the cut made at the time of surgery), support your incision when coughing by placing a pillow or rolled up towels firmly against it. Once you are able to get out of  bed, walk around indoors and cough well. You may stop using the incentive spirometer when instructed by your caregiver.  RISKS AND COMPLICATIONS Take your time so you do not get dizzy or light-headed. If you are in pain, you may need to take or  ask for pain medication before doing incentive spirometry. It is harder to take a deep breath if you are having pain. AFTER USE Rest and breathe slowly and easily. It can be helpful to keep track of a log of your progress. Your caregiver can provide you with a simple table to help with this. If you are using the spirometer at home, follow these instructions: [place] IF:  You are having difficultly using the spirometer. You have trouble using the spirometer as often as instructed. Your pain medication is not giving enough relief while using the spirometer. You develop fever of 100.5 F (38.1 C) or higher. SEEK IMMEDIATE MEDICAL CARE IF:  You cough up bloody sputum that had not been present before. You develop fever of 102 F (38.9 C) or greater. You develop worsening pain at or near the incision site. MAKE SURE YOU:  Understand these instructions. Will watch your condition. Will get help right away if you are not doing well or get worse. Document Released: 03/03/2007 Document Revised: 01/13/2012 Document Reviewed: 05/04/2007 [facility] Patient Information 2014 [place], [place].   ________________________________________________________________________

## 2022-05-15 ENCOUNTER — Encounter (HOSPITAL_COMMUNITY): Payer: Self-pay

## 2022-05-15 ENCOUNTER — Other Ambulatory Visit: Payer: Self-pay

## 2022-05-15 ENCOUNTER — Encounter (HOSPITAL_COMMUNITY)
Admission: RE | Admit: 2022-05-15 | Discharge: 2022-05-15 | Disposition: A | Discharge status: Home / Self Care | Payer: Medicare Other | Source: Ambulatory Visit | Attending: Orthopedic Surgery | Admitting: Orthopedic Surgery

## 2022-05-15 VITALS — BP 111/75 | HR 82 | Temp 97.8°F | Resp 16 | Ht 72.0 in | Wt 240.5 lb

## 2022-05-15 DIAGNOSIS — Z01812 Encounter for preprocedural laboratory examination: Secondary | ICD-10-CM | POA: Diagnosis not present

## 2022-05-15 DIAGNOSIS — G4730 Sleep apnea, unspecified: Secondary | ICD-10-CM | POA: Insufficient documentation

## 2022-05-15 DIAGNOSIS — I10 Essential (primary) hypertension: Secondary | ICD-10-CM

## 2022-05-15 DIAGNOSIS — Z01818 Encounter for other preprocedural examination: Secondary | ICD-10-CM

## 2022-05-15 DIAGNOSIS — Z8546 Personal history of malignant neoplasm of prostate: Secondary | ICD-10-CM | POA: Insufficient documentation

## 2022-05-15 DIAGNOSIS — M1612 Unilateral primary osteoarthritis, left hip: Secondary | ICD-10-CM | POA: Diagnosis not present

## 2022-05-15 LAB — CBC
HCT: 41 % (ref 39.0–52.0)
Hemoglobin: 13.8 g/dL (ref 13.0–17.0)
MCH: 31.4 pg (ref 26.0–34.0)
MCHC: 33.7 g/dL (ref 30.0–36.0)
MCV: 93.4 fL (ref 80.0–100.0)
Platelets: 166 10*3/uL (ref 150–400)
RBC: 4.39 MIL/uL (ref 4.22–5.81)
RDW: 15.6 % — ABNORMAL HIGH (ref 11.5–15.5)
WBC: 7.4 10*3/uL (ref 4.0–10.5)
nRBC: 0 % (ref 0.0–0.2)

## 2022-05-15 LAB — SURGICAL PCR SCREEN
MRSA, PCR: NEGATIVE
Staphylococcus aureus: POSITIVE — AB

## 2022-05-15 LAB — BASIC METABOLIC PANEL
Anion gap: 7 (ref 5–15)
BUN: 19 mg/dL (ref 8–23)
CO2: 25 mmol/L (ref 22–32)
Calcium: 10 mg/dL (ref 8.9–10.3)
Chloride: 106 mmol/L (ref 98–111)
Creatinine, Ser: 1.21 mg/dL (ref 0.61–1.24)
GFR, Estimated: >60 mL/min (ref >60)
Glucose, Bld: 105 mg/dL — ABNORMAL HIGH (ref 70–99)
Potassium: 4.5 mmol/L (ref 3.5–5.1)
Sodium: 138 mmol/L (ref 135–145)

## 2022-05-15 LAB — TYPE AND SCREEN
ABO/RH(D): A NEG
Antibody Screen: NEGATIVE

## 2022-05-15 NOTE — Telephone Encounter (Signed)
Next Appt With Pulmonology Martyn Ehrich, NP) 05/16/2022 at 9:00 AM

## 2022-05-15 NOTE — Progress Notes (Addendum)
For Short Stay: [place] appointment date: Date of COVID positive in last 26 days:  Bowel Prep reminder:   For Anesthesia: PCP - Dr. Alysia Penna Cardiologist - Dr. Larae Grooms. Clearance: Nicholes Rough: PAC: 05/02/22: EPIC Chest x-ray - 01/25/22 EKG - 01/25/22 Stress Test -  ECHO - 02/25/22 Cardiac Cath -  Pacemaker/ICD device last checked: Pacemaker orders received: Device Rep notified:  Spinal Cord Stimulator:  Sleep Study - Yes CPAP - NO  Fasting Blood Sugar -  Checks Blood Sugar _____ times a day Date and result of last Hgb A1c-  Blood Thinner Instructions:Eliquis will be hold 7 days before surgery as per Dr. Sarajane Jews instructions. Aspirin Instructions: Last Dose:  Activity level: Can go up a flight of stairs and activities of daily living without stopping and without chest pain and/or shortness of breath   Able to exercise without chest pain and/or shortness of breath   Unable to go up a flight of stairs without chest pain and/or shortness of breath     Anesthesia review: Hx: HTN,PE,OSA(NO CPAP). Pt gets SOB after climbing a flight of stairs.  Patient denies shortness of breath, fever, cough and chest pain at PAT appointment   Patient verbalized understanding of instructions that were given to them at the PAT appointment. Patient was also instructed that they will need to review over the PAT instructions again at home before surgery.

## 2022-05-15 NOTE — Progress Notes (Signed)
@Patient  ID: Scarlette Slice, male    DOB: 20-Oct-1946, 76 y.o.   MRN: [id number]  Chief Complaint  Patient presents with   Follow-up    Surgical clearance-Left hip surg. Scheduled 05/27/2022.    Referring provider: Nelwyn Salisbury, MD  HPI: 76 year old male, smoked.  Past medical history significant for hypertension, OSA,  dyslipidemia, coronary artery disease, pulmonary embolism, severe mitral regurgitation, osteoarthritis, gout. Patient of Dr. Sherene Sires, last seen in office on 06/05/2021 for upper airway cough syndrome.  05/16/2022 Patient presents today for surgical risk assessment.  Planning to have total left hip arthroplasty under spinal anesthesia with Dr. Luiz Blare on 05/27/2022.  Patient is on Eliquis 5mg  twice daily for history of DVT/PE. Anticoagulation recommendations have come from PCP. Dr. Clent Ridges. Patient will be bridged with Lovenox 1 year prior to surgery and resume Eliquis after.   He is doing well today. No acute respiratory complaints. He previously was following with Dr. Sherene Sires for upper airway cough. He is not bothered by his cough, only really notices it while talking on the phone. He has been taking reflux medication as directed with effect. Pcp filled famotidine prescription. He has hx severe sleep apnea, sleep study in 2018 showed AHI 70/1/hr. OSA was being managed by Dr. Mayford Knife. He was on CPAP at one times but has been off therapy for several years. No particular reason why he stopped wearing CPAP. He is interested in repeating sleep study after having hip surgery. Denies fever, chills, sweats, shortness of breath, chest tightness or wheezing.    No Known Allergies  Immunization History  Administered Date(s) Administered   Fluad Quad(high Dose 65+) 08/22/2020, 07/30/2021   Influenza Split 09/05/2011, 11/09/2012   Influenza Whole 10/04/2009, 09/24/2010   Influenza, High Dose Seasonal PF 07/17/2015, 09/01/2017, 07/15/2018, 07/28/2019   Influenza,inj,Quad PF,6+ Mos 11/24/2013,  11/16/2014   Influenza-Unspecified 07/28/2019   PFIZER(Purple Top)SARS-COV-2 Vaccination 11/19/2019, 12/10/2019, 09/26/2020   Pneumococcal Conjugate-13 07/17/2015   Pneumococcal Polysaccharide-23 10/04/2009   Pneumococcal-Unspecified 09/03/2017   Td 03/10/2009   Tdap 07/18/2018   Zoster Recombinat (Shingrix) 06/05/2021, 08/21/2021   Zoster, Live 09/05/2011    Past Medical History:  Diagnosis Date   Arthritis    neck and back    Chronic neck pain    Depression    ED (erectile dysfunction)    GERD (gastroesophageal reflux disease)    dysphagia   Gout    sees Dr. Alben Deeds    Hyperlipidemia    Hypertension    OSA (obstructive sleep apnea) 12/18/2017   Severe with AHI at 70.1/hr and is now on CPAP at 11cm H2o   Prostate cancer [facility]    history of   Pulmonary embolism (HCC) 06/2019   S/P mitral valve clip implantation 01/24/2022   s/p Mitraclip XTW + NTW with Dr. Excell Seltzer and Dr. Lynnette Caffey   Sleep apnea    snoring/never checked    Tobacco History: Social History   Tobacco Use  Smoking Status Never  Smokeless Tobacco Never   Counseling given: Not Answered   Outpatient Medications Prior to Visit  Medication Sig Dispense Refill   allopurinol (ZYLOPRIM) 300 MG tablet TAKE 1 TABLET BY MOUTH EVERY DAY 90 tablet 0   amoxicillin (AMOXIL) 500 MG capsule Take 4 capsules (2,000 mg total) by mouth as directed. Take (4) capsules 1 hour before dental work 4 capsule 3   apixaban (ELIQUIS) 5 MG TABS tablet Take 1 tablet (5 mg total) by mouth 2 (two) times daily. 180 tablet 3  atorvastatin (LIPITOR) 40 MG tablet TAKE 1 TABLET BY MOUTH EVERY DAY 90 tablet 0   cyclobenzaprine (FLEXERIL) 10 MG tablet TAKE 1 TABLET BY MOUTH 3 TIMES DAILY AS NEEDED FOR MUSCLE SPASMS 90 tablet 5   enoxaparin (LOVENOX) 100 MG/ML injection Take one injection twice daily beginning 7 days before surgery and ending the evening before surgery 14 mL 0   famotidine (PEPCID) 20 MG tablet Take 1 tablet (20 mg  total) by mouth daily after supper. 90 tablet 3   gabapentin (NEURONTIN) 100 MG capsule TAKE 1 CAPSULE BY MOUTH 4 TIMES DAILY (Patient taking differently: Take 100 mg by mouth 2 (two) times daily.) 120 capsule 2   Melatonin 12 MG TABS Take 12 mg by mouth at bedtime as needed (sleep).     METAMUCIL FIBER PO Take 1 Dose by mouth See admin instructions. Take 1 dose daily, may take a second dose as needed for constipation     [START ON 07/18/2022] oxyCODONE-acetaminophen (PERCOCET) 10-325 MG tablet Take 1 tablet by mouth every 6 (six) hours as needed for pain. 120 tablet 0   PARoxetine (PAXIL) 20 MG tablet TAKE 1 TABLET BY MOUTH EVERY MORNING 90 tablet 0   telmisartan-hydrochlorothiazide (MICARDIS HCT) 80-25 MG tablet TAKE 1 TABLET BY MOUTH EVERY DAY 30 tablet 11   No facility-administered medications prior to visit.   Review of Systems  Review of Systems  Constitutional: Negative.   HENT: Negative.    Respiratory:  Positive for cough. Negative for chest tightness, shortness of breath and wheezing.      Physical Exam  BP 120/70 (BP Location: Left Arm, Cuff Size: Normal)   Pulse 90   Temp 98.2 F (36.8 C) (Temporal)   Ht 6' (1.829 m)   Wt 242 lb 9.6 oz (110 kg)   SpO2 97%   BMI 32.90 kg/m  Physical Exam Constitutional:      General: He is not in acute distress.    Appearance: Normal appearance. He is obese. He is not ill-appearing.  HENT:     Head: Normocephalic and atraumatic.     Mouth/Throat:     Mouth: Mucous membranes are moist.     Pharynx: Oropharynx is clear.  Cardiovascular:     Rate and Rhythm: Normal rate and regular rhythm.  Pulmonary:     Effort: Pulmonary effort is normal.     Breath sounds: Normal breath sounds.     Comments: CTA Musculoskeletal:        General: Normal range of motion.     Cervical back: Normal range of motion and neck supple.  Skin:    General: Skin is warm and dry.  Neurological:     General: No focal deficit present.     Mental Status:  He is alert and oriented to person, place, and time. Mental status is at baseline.  Psychiatric:        Mood and Affect: Mood normal.        Behavior: Behavior normal.        Thought Content: Thought content normal.        Judgment: Judgment normal.      Lab Results:  CBC    Component Value Date/Time   WBC 7.4 05/15/2022 1104   RBC 4.39 05/15/2022 1104   HGB 13.8 05/15/2022 1104   HGB 12.7 (L) 12/07/2021 1150   HCT 41.0 05/15/2022 1104   HCT 37.3 (L) 12/07/2021 1150   PLT 166 05/15/2022 1104   PLT 266 12/07/2021 1150  MCV 93.4 05/15/2022 1104   MCV 91 12/07/2021 1150   MCH 31.4 05/15/2022 1104   MCHC 33.7 05/15/2022 1104   RDW 15.6 (H) 05/15/2022 1104   RDW 14.0 12/07/2021 1150   LYMPHSABS 1.2 08/23/2021 0940   MONOABS 1.3 (H) 08/23/2021 0940   EOSABS 0.2 08/23/2021 0940   BASOSABS 0.0 08/23/2021 0940    BMET    Component Value Date/Time   NA 138 05/15/2022 1104   NA 136 12/07/2021 1150   K 4.5 05/15/2022 1104   CL 106 05/15/2022 1104   CO2 25 05/15/2022 1104   GLUCOSE 105 (H) 05/15/2022 1104   BUN 19 05/15/2022 1104   BUN 18 12/07/2021 1150   CREATININE 1.21 05/15/2022 1104   CREATININE 1.20 (H) 08/22/2020 1208   CALCIUM 10.0 05/15/2022 1104   GFRNONAA >60 05/15/2022 1104   GFRAA >60 06/17/2019 0605    BNP    Component Value Date/Time   BNP 133.8 (H) 01/22/2022 1414    ProBNP No results found for: "PROBNP"  Imaging: No results found.   Assessment & Plan:   OSA (obstructive sleep apnea) - Sleep study in 2018 showed severe OSA, AHI 70.1/hour. CPAP was previously being managed by Dr. Mayford Knife. Patient is no longer on PAP therapy. He is interested in repeating sleep study. Epworth 7. He has symptoms of snoring. No significant daytime sleepiness. We will order split night sleep study to be completed in August/September.   History of pulmonary embolism - Anticoagulation being managed by PCP. Patient is bridging with lovenox prior to hip surgery and  will then resume Eliquis.   Upper airway cough syndrome - Occasional throat clearing, especially when talking for long periods of time - Continue Neurontin 100mg  BID and famotidine 20mg  at bedtime  - Checking routine CXR today   Pre-operative respiratory examination - Patient is low-intermediate risk for prolonged mechanical ventilation and/or postop pulmonary complications due to history of severe sleep apnea not currently on CPAP.  He has no acute respiratory symptoms.  Exam today was benign.  Lungs are clear.  Vital signs are stable.  We will check preop chest x-ray today.  Ultimate clearance will be decided upon by anesthesiology and surgeon.    1) RISK FOR PROLONGED MECHANICAL VENTILAION - > 48h  1A) Arozullah - Prolonged mech ventilation risk Arozullah Postperative Pulmonary Risk Score - for mech ventilation dependence >48h USAA, Ann Surg 2000, major non-cardiac surgery) Comment Score  Type of surgery - abd ao aneurysm (27), thoracic (21), neurosurgery / upper abdominal / vascular (21), neck (11) Hip arthroplasty  5  Emergency Surgery - (11)  0  ALbumin < 3 or poor nutritional state - (9)  0  BUN > 30 -  (8)  0  Partial or completely dependent functional status - (7)  0  COPD -  (6)  0  Age - 60 to 69 (4), > 70  (6)  6  TOTAL  11  Risk Stratifcation scores  - < 10 (0.5%), 11-19 (1.8%), 20-27 (4.2%), 28-40 (10.1%), >40 (26.6%)  1.8% risk prolonged mech ventilation       1B) GUPTA - Prolonged Mech Vent Risk Score source Risk  Guptal post op prolonged mech ventilation > 48h or reintubation < 30 days - ACS 2007-2008 dataset - [email or web address] 0.3 % Risk of mechanical ventilation for >48 hrs after surgery, or unplanned intubation ?30 days of surgery    2) RISK FOR POST OP PNEUMONIA Score source Risk  Chales Abrahams - Post Op Pnemounia  risk  [email or web address] 0.4 % Risk of postoperative  pneumonia     R3) ISK FOR ANY POST-OP PULMONARY COMPLICATION Score source Risk  CANET/ARISCAT Score - risk for ANY/ALl pulmonary complications - > risk of in-hospital post-op pulmonary complications (composite including respiratory failure, respiratory infection, pleural effusion, atelectasis, pneumothorax, bronchospasm, aspiration pneumonitis) [employer]es - based on age, anemia, pulse ox, resp infection prior 30d, incision site, duration of surgery, and emergency v elective surgery Low risk 1.6% risk of in-hospital post-op pulmonary complications (composite including respiratory failure, respiratory infection, pleural effusion, atelectasis, pneumothorax, bronchospasm, aspiration pneumonitis)      Glenford Bayley, NP 05/16/2022

## 2022-05-15 NOTE — Progress Notes (Signed)
PCR: + STAPH °

## 2022-05-16 ENCOUNTER — Ambulatory Visit (INDEPENDENT_AMBULATORY_CARE_PROVIDER_SITE_OTHER): Payer: Medicare Other

## 2022-05-16 ENCOUNTER — Encounter: Payer: Self-pay | Admitting: Primary Care

## 2022-05-16 ENCOUNTER — Ambulatory Visit (INDEPENDENT_AMBULATORY_CARE_PROVIDER_SITE_OTHER): Payer: Medicare Other | Admitting: Primary Care

## 2022-05-16 VITALS — BP 120/70 | HR 90 | Temp 98.2°F | Ht 72.0 in | Wt 242.6 lb

## 2022-05-16 DIAGNOSIS — Z01818 Encounter for other preprocedural examination: Secondary | ICD-10-CM | POA: Diagnosis not present

## 2022-05-16 DIAGNOSIS — G4733 Obstructive sleep apnea (adult) (pediatric): Secondary | ICD-10-CM | POA: Diagnosis not present

## 2022-05-16 DIAGNOSIS — Z01811 Encounter for preprocedural respiratory examination: Secondary | ICD-10-CM | POA: Insufficient documentation

## 2022-05-16 DIAGNOSIS — R058 Other specified cough: Secondary | ICD-10-CM

## 2022-05-16 DIAGNOSIS — Z86711 Personal history of pulmonary embolism: Secondary | ICD-10-CM

## 2022-05-16 NOTE — Assessment & Plan Note (Addendum)
-   Occasional throat clearing, especially when talking for long periods of time - Continue Neurontin '100mg'$  BID and famotidine '20mg'$  at bedtime  - Checking routine CXR today

## 2022-05-16 NOTE — Assessment & Plan Note (Signed)
-   Sleep study in 2018 showed severe OSA, AHI 70.1/hour. CPAP was previously being managed by Dr. Radford Pax. Patient is no longer on PAP therapy. He is interested in repeating sleep study. Epworth 7. He has symptoms of snoring. No significant daytime sleepiness. We will order split night sleep study to be completed in August/September.

## 2022-05-16 NOTE — Patient Instructions (Addendum)
You are intermediate risk for respiratory failure and/or postop pulmonary complications due to untreated sleep apnea  Recommendations: Encourage early ambulation and wear compression stockings post surgery Use incentive spirometer 10x/hour  Focus on side sleeping position or elevate head of bed 30 degrees while sleeping Do not drive if sleepy  We will repeat sleep study in September   Orders: CXR re: pre-op   Follow-up 3 months with Dr. Melvyn Novas

## 2022-05-16 NOTE — Assessment & Plan Note (Signed)
-   Patient is low-intermediate risk for prolonged mechanical ventilation and/or postop pulmonary complications due to history of severe sleep apnea not currently on CPAP.  He has no acute respiratory symptoms.  Exam today was benign.  Lungs are clear.  Vital signs are stable.  We will check preop chest x-ray today.  Ultimate clearance will be decided upon by anesthesiology and surgeon.

## 2022-05-16 NOTE — Assessment & Plan Note (Signed)
-   Anticoagulation being managed by PCP. Patient is bridging with lovenox prior to hip surgery and will then resume Eliquis.

## 2022-05-17 NOTE — Telephone Encounter (Signed)
OV notes and clearance form have been faxed back to [facility]. Nothing further needed at this time.  

## 2022-05-20 NOTE — Progress Notes (Signed)
Anesthesia Chart Review   Case: [id number] Date/Time: 05/27/22 0939   Procedure: TOTAL HIP ARTHROPLASTY ANTERIOR APPROACH (Left: Hip)   Anesthesia type: Spinal   Pre-op diagnosis: LEFT HIP DEGENERATIVE JOINT DISEASE   Location: [facility]   Surgeons: Dorna Leitz, MD       DISCUSSION:76 y.o. never smoker with h/o HTN, sleep apnea, s/p mitral valve clip 01/2022, PE, prostate cancer, left hip djd scheduled for above procedure 05/27/2022 with Dr. Dorna Leitz.   Pt last seen by pulmonologist 05/16/2022. Per OV note, "Pre-operative respiratory examination - Patient is low-intermediate risk for prolonged mechanical ventilation and/or postop pulmonary complications due to history of severe sleep apnea not currently on CPAP.  He has no acute respiratory symptoms.  Exam today was benign.  Lungs are clear.  Vital signs are stable.  We will check preop chest x-ray today.  Ultimate clearance will be decided upon by anesthesiology and surgeon. "  Pt last seen by cardiology.  Per OV note, "[facility]'s perioperative risk of a major cardiac event is 0.9% according to the Revised Cardiac Risk Index (RCRI).  Therefore, he is at low risk for perioperative complications.   His functional capacity is fair at 4.06 METs according to the Duke Activity Status Index (DASI). Recommendations: According to ACC/AHA guidelines, no further cardiovascular testing needed.  The patient may proceed to surgery at acceptable risk.  "  Pt reports he was advised to hold Eliquis 7 days prior to procedure by PCP.   Anticipate pt can proceed with planned procedure barring acute status change.   VS: BP 111/75   Pulse 82   Temp 36.6 C (Oral)   Resp 16   Ht 6' (1.829 m)   Wt 109.1 kg   SpO2 99%   BMI 32.62 kg/m   PROVIDERS: [name], MD is PCP    LABS: Labs reviewed: Acceptable for surgery. (all labs ordered are listed, but only abnormal results are displayed)  Labs Reviewed  SURGICAL PCR SCREEN - Abnormal;  Notable for the following components:      Result Value   Staphylococcus aureus POSITIVE (*)    All other components within normal limits  BASIC METABOLIC PANEL - Abnormal; Notable for the following components:   Glucose, Bld 105 (*)    All other components within normal limits  CBC - Abnormal; Notable for the following components:   RDW 15.6 (*)    All other components within normal limits  TYPE AND SCREEN     IMAGES:   EKG: 01/25/2022 Rate 81 bpm  Sinus rhythm with frequent Premature ventricular complexes Left bundle branch block Abnormal ECG  CV: Echo 02/25/2022 1. Left ventricular ejection fraction, by estimation, is 60%%. The left  ventricle has normal function. Left ventricular diastolic parameters are  indeterminate.   2. Right ventricular systolic function is normal. The right ventricular  size is normal. There is normal pulmonary artery systolic pressure.   3. Left atrial size was mildly dilated.   4. S/p MitraClip XTW and NTW. (01/24/22). Mean gradient through the valve  is 4 mm Hg and MVA is calculated at 2.78 cm2 (HR 72 bpm). MR is very  eccentric, directed anteriorly along wall of left atrium, probably mild.  Compared to echo from March 2023, no  significant change . The mitral valve has been repaired/replaced. Mild  mitral valve regurgitation. Procedure Date: 01/24/2022.   5. Aortic valve regurgitation is trivial. Aortic valve sclerosis is  present, with no evidence  of aortic valve stenosis.   6. The inferior vena cava is normal in size with greater than 50%  respiratory variability, suggesting right atrial pressure of 3 mmHg.  Myocardial Perfusion 11/13/2021   EKG shows no changes during Lexiscan stress   Myoview scan with probable normal perfusion and mild soft tissue attenuation (diaphragm)  No ischemia or scar   LVEF 59%.   Low risk study   No significant change from study from 2018. Past Medical History:  Diagnosis Date   Arthritis    neck and back     Chronic neck pain    Depression    ED (erectile dysfunction)    GERD (gastroesophageal reflux disease)    dysphagia   Gout    sees Dr. Leigh Aurora    Hyperlipidemia    Hypertension    OSA (obstructive sleep apnea) 12/18/2017   Severe with AHI at 70.1/hr and is now on CPAP at 11cm H2o   Prostate cancer [facility])    history of   Pulmonary embolism ([place]) 06/2019   S/P mitral valve clip implantation 01/24/2022   s/p Mitraclip XTW + NTW with Dr. Burt Knack and Dr. Ali Lowe   Sleep apnea    snoring/never checked    Past Surgical History:  Procedure Laterality Date   AMPUTATION Left 07/18/2018   Procedure: LEFT RING FINGER REVISION AMPUTATION;  Surgeon: Leanora Cover, MD;  Location: [place];  Service: Orthopedics;  Laterality: Left;   BACK SURGERY     CERVICAL FUSION  12/02/2007   per Dr. Sherley Bounds   COLONOSCOPY  03/11/2017   per Dr. Ardis Hughs, clear, no repeats needed    injection lower back Right 09/17/2016   IR IVC FILTER PLMT / S&I /IMG GUID/MOD SED  11/15/2021   IR IVC FILTER RETRIEVAL / S&I /IMG GUID/MOD SED  12/14/2021   IR RADIOLOGIST EVAL & MGMT  10/30/2021   LUMBAR LAMINECTOMY/DECOMPRESSION MICRODISCECTOMY Left 11/21/2021   Procedure: Laminectomy and Foraminotomy - left - Lumbar three-four;  Surgeon: [name], MD;  Location: [place];  Service: Neurosurgery;  Laterality: Left;   MITRAL VALVE REPAIR N/A 01/24/2022   Procedure: MITRAL VALVE REPAIR;  Surgeon: [name], MD;  Location: [place] CV LAB;  Service: Cardiovascular;  Laterality: N/A;   PROSTATECTOMY  2000   RIGHT/LEFT HEART CATH AND CORONARY ANGIOGRAPHY N/A 12/20/2021   Procedure: RIGHT/LEFT HEART CATH AND CORONARY ANGIOGRAPHY;  Surgeon: [name], MD;  Location: [place] [facility];  Service: Cardiovascular;  Laterality: N/A;   ROTATOR CUFF REPAIR Right 11/2017   TEE WITHOUT CARDIOVERSION N/A 01/01/2022   Procedure: TRANSESOPHAGEAL ECHOCARDIOGRAM (TEE);  Surgeon: [name], MD;  Location: [place];  Service: Cardiovascular;  Laterality: N/A;   TEE WITHOUT CARDIOVERSION N/A 01/24/2022   Procedure: TRANSESOPHAGEAL ECHOCARDIOGRAM (TEE);  Surgeon: [name], MD;  Location: [place] [facility];  Service: Cardiovascular;  Laterality: N/A;   TONSILLECTOMY     as a child   VASECTOMY      MEDICATIONS:  allopurinol (ZYLOPRIM) 300 MG tablet   amoxicillin (AMOXIL) 500 MG capsule   apixaban (ELIQUIS) 5 MG TABS tablet   atorvastatin (LIPITOR) 40 MG tablet   cyclobenzaprine (FLEXERIL) 10 MG tablet   enoxaparin (LOVENOX) 100 MG/ML injection   famotidine (PEPCID) 20 MG tablet   gabapentin (NEURONTIN) 100 MG capsule   Melatonin 12 MG TABS   METAMUCIL FIBER PO   [START ON 07/18/2022] oxyCODONE-acetaminophen (PERCOCET) 10-325 MG tablet   PARoxetine (PAXIL) 20 MG tablet  telmisartan-hydrochlorothiazide (MICARDIS HCT) 80-25 MG tablet   No current facility-administered medications for this encounter.     Ricky Felix Ward, PA-C [facility] Pre-Surgical Testing [phone]

## 2022-05-20 NOTE — Progress Notes (Signed)
Please let patient know CXR showed no active cardiopulmonary disease. This was for pre-op clearance.

## 2022-05-23 DIAGNOSIS — M1612 Unilateral primary osteoarthritis, left hip: Secondary | ICD-10-CM | POA: Diagnosis not present

## 2022-05-23 NOTE — Progress Notes (Addendum)
Updated date of surgery: 05/27/22  Updated time of arrival:0515 AM  Drink Pre surgery ensure at 4:15 AM  Patient will be discharged from hospital and monitored at home for 24 hours by: Rowe Clack  Patient denies any changes in allergies, medications, medical history since pre op appointment on:  Pre op instructions reviewed, follow up questions addressed and patient verbalized understanding at this time.

## 2022-05-27 ENCOUNTER — Encounter (HOSPITAL_COMMUNITY)
Admission: RE | Disposition: A | Discharge status: Home / Self Care | Payer: Self-pay | Source: Home / Self Care | Attending: Orthopedic Surgery

## 2022-05-27 ENCOUNTER — Other Ambulatory Visit: Payer: Self-pay

## 2022-05-27 ENCOUNTER — Ambulatory Visit (HOSPITAL_COMMUNITY): Payer: Medicare Other

## 2022-05-27 ENCOUNTER — Encounter (HOSPITAL_COMMUNITY): Payer: Self-pay | Admitting: Orthopedic Surgery

## 2022-05-27 ENCOUNTER — Ambulatory Visit (HOSPITAL_BASED_OUTPATIENT_CLINIC_OR_DEPARTMENT_OTHER): Payer: Medicare Other | Admitting: Certified Registered"

## 2022-05-27 ENCOUNTER — Ambulatory Visit (HOSPITAL_COMMUNITY): Payer: Medicare Other | Admitting: Physician Assistant

## 2022-05-27 ENCOUNTER — Ambulatory Visit (HOSPITAL_COMMUNITY)
Admission: RE | Admit: 2022-05-27 | Discharge: 2022-05-27 | Disposition: A | Discharge status: Home / Self Care | Payer: Medicare Other | Attending: Orthopedic Surgery | Admitting: Orthopedic Surgery

## 2022-05-27 DIAGNOSIS — I10 Essential (primary) hypertension: Secondary | ICD-10-CM | POA: Diagnosis not present

## 2022-05-27 DIAGNOSIS — G4733 Obstructive sleep apnea (adult) (pediatric): Secondary | ICD-10-CM | POA: Diagnosis not present

## 2022-05-27 DIAGNOSIS — N289 Disorder of kidney and ureter, unspecified: Secondary | ICD-10-CM | POA: Insufficient documentation

## 2022-05-27 DIAGNOSIS — G4730 Sleep apnea, unspecified: Secondary | ICD-10-CM | POA: Diagnosis not present

## 2022-05-27 DIAGNOSIS — M1612 Unilateral primary osteoarthritis, left hip: Secondary | ICD-10-CM

## 2022-05-27 DIAGNOSIS — Z96642 Presence of left artificial hip joint: Secondary | ICD-10-CM

## 2022-05-27 DIAGNOSIS — R0600 Dyspnea, unspecified: Secondary | ICD-10-CM | POA: Insufficient documentation

## 2022-05-27 DIAGNOSIS — Z86711 Personal history of pulmonary embolism: Secondary | ICD-10-CM | POA: Diagnosis not present

## 2022-05-27 DIAGNOSIS — I2510 Atherosclerotic heart disease of native coronary artery without angina pectoris: Secondary | ICD-10-CM

## 2022-05-27 DIAGNOSIS — D759 Disease of blood and blood-forming organs, unspecified: Secondary | ICD-10-CM | POA: Diagnosis not present

## 2022-05-27 HISTORY — PX: TOTAL HIP ARTHROPLASTY: SHX124

## 2022-05-27 SURGERY — ARTHROPLASTY, HIP, TOTAL, ANTERIOR APPROACH
Anesthesia: General | Site: Hip | Laterality: Left

## 2022-05-27 MED ORDER — WATER FOR IRRIGATION, STERILE IR SOLN
Status: DC | PRN
  Administered 2022-05-27: 2000 mL

## 2022-05-27 MED ORDER — ONDANSETRON HCL 4 MG/2ML IJ SOLN
INTRAMUSCULAR | Status: DC | PRN
  Administered 2022-05-27: 4 mg via INTRAVENOUS

## 2022-05-27 MED ORDER — FENTANYL CITRATE PF 50 MCG/ML IJ SOSY
PREFILLED_SYRINGE | INTRAMUSCULAR | Status: AC
  Administered 2022-05-27: 50 ug via INTRAVENOUS
  Filled 2022-05-27: qty 3

## 2022-05-27 MED ORDER — ACETAMINOPHEN 10 MG/ML IV SOLN: 1000.0000 mg | Freq: Once | INTRAVENOUS | Status: DC | PRN

## 2022-05-27 MED ORDER — HYDROMORPHONE HCL 1 MG/ML IJ SOLN
0.2500 mg | INTRAMUSCULAR | Status: DC | PRN
  Administered 2022-05-27: 0.5 mg via INTRAVENOUS

## 2022-05-27 MED ORDER — PHENYLEPHRINE HCL (PRESSORS) 10 MG/ML IV SOLN
INTRAVENOUS | Status: AC
  Filled 2022-05-27: qty 1

## 2022-05-27 MED ORDER — BUPIVACAINE-EPINEPHRINE 0.25% -1:200000 IJ SOLN
INTRAMUSCULAR | Status: DC | PRN
  Administered 2022-05-27: 30 mL

## 2022-05-27 MED ORDER — OXYCODONE HCL 5 MG PO TABS
ORAL_TABLET | ORAL | Status: AC
  Filled 2022-05-27: qty 1

## 2022-05-27 MED ORDER — OXYCODONE HCL 5 MG PO TABS
5.0000 mg | ORAL_TABLET | Freq: Once | ORAL | Status: AC | PRN
  Administered 2022-05-27: 5 mg via ORAL

## 2022-05-27 MED ORDER — OXYCODONE HCL 5 MG PO TABS: 5.0000 mg | ORAL_TABLET | Freq: Once | ORAL | Status: AC | PRN

## 2022-05-27 MED ORDER — KETOROLAC TROMETHAMINE 30 MG/ML IJ SOLN
INTRAMUSCULAR | Status: DC | PRN
  Administered 2022-05-27: 30 mg via INTRAVENOUS

## 2022-05-27 MED ORDER — VANCOMYCIN HCL IN DEXTROSE 1-5 GM/200ML-% IV SOLN
INTRAVENOUS | Status: AC
  Filled 2022-05-27: qty 200

## 2022-05-27 MED ORDER — CYCLOBENZAPRINE HCL 10 MG PO TABS: 10.0000 mg | ORAL_TABLET | Freq: Three times a day (TID) | ORAL | 5 refills | Status: DC | PRN

## 2022-05-27 MED ORDER — LACTATED RINGERS IV SOLN: INTRAVENOUS | Status: DC

## 2022-05-27 MED ORDER — DEXAMETHASONE SODIUM PHOSPHATE 10 MG/ML IJ SOLN
INTRAMUSCULAR | Status: AC
  Filled 2022-05-27: qty 1

## 2022-05-27 MED ORDER — POVIDONE-IODINE 10 % EX SWAB: 2.0000 | Freq: Once | CUTANEOUS | Status: DC

## 2022-05-27 MED ORDER — TRANEXAMIC ACID-NACL 1000-0.7 MG/100ML-% IV SOLN
1000.0000 mg | INTRAVENOUS | Status: AC
  Administered 2022-05-27: 1000 mg via INTRAVENOUS
  Filled 2022-05-27: qty 100

## 2022-05-27 MED ORDER — BUPIVACAINE LIPOSOME 1.3 % IJ SUSP
INTRAMUSCULAR | Status: AC
  Filled 2022-05-27: qty 10

## 2022-05-27 MED ORDER — PROPOFOL 10 MG/ML IV BOLUS
INTRAVENOUS | Status: DC | PRN
  Administered 2022-05-27: 30 mg via INTRAVENOUS
  Administered 2022-05-27: 110 mg via INTRAVENOUS

## 2022-05-27 MED ORDER — OXYCODONE HCL 5 MG PO TABS
ORAL_TABLET | ORAL | Status: AC
  Administered 2022-05-27: 5 mg via ORAL
  Filled 2022-05-27: qty 1

## 2022-05-27 MED ORDER — KETAMINE HCL 50 MG/5ML IJ SOSY
PREFILLED_SYRINGE | INTRAMUSCULAR | Status: AC
  Filled 2022-05-27: qty 5

## 2022-05-27 MED ORDER — ACETAMINOPHEN 500 MG PO TABS: 1000.0000 mg | ORAL_TABLET | Freq: Once | ORAL | Status: DC | PRN

## 2022-05-27 MED ORDER — PHENYLEPHRINE HCL-NACL 20-0.9 MG/250ML-% IV SOLN
INTRAVENOUS | Status: DC | PRN
  Administered 2022-05-27: 30 ug/min via INTRAVENOUS

## 2022-05-27 MED ORDER — DEXAMETHASONE SODIUM PHOSPHATE 10 MG/ML IJ SOLN
INTRAMUSCULAR | Status: DC | PRN
  Administered 2022-05-27: 8 mg via INTRAVENOUS

## 2022-05-27 MED ORDER — PHENYLEPHRINE HCL (PRESSORS) 10 MG/ML IV SOLN
INTRAVENOUS | Status: DC | PRN
  Administered 2022-05-27: 80 ug via INTRAVENOUS

## 2022-05-27 MED ORDER — FENTANYL CITRATE PF 50 MCG/ML IJ SOSY
25.0000 ug | PREFILLED_SYRINGE | INTRAMUSCULAR | Status: DC | PRN
  Administered 2022-05-27 (×2): 50 ug via INTRAVENOUS

## 2022-05-27 MED ORDER — SUCCINYLCHOLINE CHLORIDE 200 MG/10ML IV SOSY
PREFILLED_SYRINGE | INTRAVENOUS | Status: DC | PRN
  Administered 2022-05-27: 120 mg via INTRAVENOUS

## 2022-05-27 MED ORDER — HYDROMORPHONE HCL 1 MG/ML IJ SOLN
INTRAMUSCULAR | Status: DC | PRN
  Administered 2022-05-27 (×3): .4 mg via INTRAVENOUS

## 2022-05-27 MED ORDER — FENTANYL CITRATE (PF) 100 MCG/2ML IJ SOLN
INTRAMUSCULAR | Status: DC | PRN
  Administered 2022-05-27 (×2): 50 ug via INTRAVENOUS
  Administered 2022-05-27: 100 ug via INTRAVENOUS

## 2022-05-27 MED ORDER — ACETAMINOPHEN 160 MG/5ML PO SOLN: 1000.0000 mg | Freq: Once | ORAL | Status: DC | PRN

## 2022-05-27 MED ORDER — FENTANYL CITRATE (PF) 100 MCG/2ML IJ SOLN
INTRAMUSCULAR | Status: AC
  Filled 2022-05-27: qty 2

## 2022-05-27 MED ORDER — METHOCARBAMOL 500 MG PO TABS: 500.0000 mg | ORAL_TABLET | Freq: Four times a day (QID) | ORAL | Status: DC | PRN

## 2022-05-27 MED ORDER — LIDOCAINE 2% (20 MG/ML) 5 ML SYRINGE
INTRAMUSCULAR | Status: DC | PRN
  Administered 2022-05-27: 60 mg via INTRAVENOUS

## 2022-05-27 MED ORDER — APIXABAN 2.5 MG PO TABS: 2.5000 mg | ORAL_TABLET | Freq: Two times a day (BID) | ORAL | 0 refills | Status: AC

## 2022-05-27 MED ORDER — ACETAMINOPHEN 10 MG/ML IV SOLN
INTRAVENOUS | Status: DC | PRN
  Administered 2022-05-27: 1000 mg via INTRAVENOUS

## 2022-05-27 MED ORDER — ORAL CARE MOUTH RINSE: 15.0000 mL | Freq: Once | OROMUCOSAL | Status: AC

## 2022-05-27 MED ORDER — KETAMINE HCL 10 MG/ML IJ SOLN
INTRAMUSCULAR | Status: DC | PRN
  Administered 2022-05-27: 40 mg via INTRAVENOUS

## 2022-05-27 MED ORDER — OXYCODONE HCL 5 MG/5ML PO SOLN: 5.0000 mg | Freq: Once | ORAL | Status: AC | PRN

## 2022-05-27 MED ORDER — METHOCARBAMOL 500 MG IVPB - SIMPLE MED
500.0000 mg | Freq: Four times a day (QID) | INTRAVENOUS | Status: DC | PRN
  Administered 2022-05-27: 500 mg via INTRAVENOUS

## 2022-05-27 MED ORDER — ROCURONIUM BROMIDE 10 MG/ML (PF) SYRINGE
PREFILLED_SYRINGE | INTRAVENOUS | Status: DC | PRN
  Administered 2022-05-27: 50 mg via INTRAVENOUS

## 2022-05-27 MED ORDER — HYDROMORPHONE HCL 2 MG/ML IJ SOLN
INTRAMUSCULAR | Status: AC
  Filled 2022-05-27: qty 1

## 2022-05-27 MED ORDER — ONDANSETRON HCL 4 MG/2ML IJ SOLN
INTRAMUSCULAR | Status: AC
  Filled 2022-05-27: qty 2

## 2022-05-27 MED ORDER — BUPIVACAINE LIPOSOME 1.3 % IJ SUSP: 10.0000 mL | Freq: Once | INTRAMUSCULAR | Status: DC

## 2022-05-27 MED ORDER — OXYCODONE-ACETAMINOPHEN 10-325 MG PO TABS: 1.0000 | ORAL_TABLET | ORAL | 0 refills | Status: AC | PRN

## 2022-05-27 MED ORDER — HYDROMORPHONE HCL 1 MG/ML IJ SOLN
INTRAMUSCULAR | Status: AC
  Administered 2022-05-27: 0.5 mg via INTRAVENOUS
  Filled 2022-05-27: qty 1

## 2022-05-27 MED ORDER — CEFAZOLIN SODIUM-DEXTROSE 2-4 GM/100ML-% IV SOLN
2.0000 g | INTRAVENOUS | Status: AC
  Administered 2022-05-27: 2 g via INTRAVENOUS
  Filled 2022-05-27: qty 100

## 2022-05-27 MED ORDER — BUPIVACAINE LIPOSOME 1.3 % IJ SUSP
INTRAMUSCULAR | Status: DC | PRN
  Administered 2022-05-27: 10 mL

## 2022-05-27 MED ORDER — METHOCARBAMOL 500 MG IVPB - SIMPLE MED
INTRAVENOUS | Status: AC
  Filled 2022-05-27: qty 55

## 2022-05-27 MED ORDER — SODIUM CHLORIDE 0.9 % IR SOLN
Status: DC | PRN
  Administered 2022-05-27: 1000 mL

## 2022-05-27 MED ORDER — BUPIVACAINE-EPINEPHRINE (PF) 0.25% -1:200000 IJ SOLN
INTRAMUSCULAR | Status: AC
  Filled 2022-05-27: qty 30

## 2022-05-27 MED ORDER — ACETAMINOPHEN 10 MG/ML IV SOLN
INTRAVENOUS | Status: AC
  Filled 2022-05-27: qty 100

## 2022-05-27 MED ORDER — CHLORHEXIDINE GLUCONATE 0.12 % MT SOLN
15.0000 mL | Freq: Once | OROMUCOSAL | Status: AC
  Administered 2022-05-27: 15 mL via OROMUCOSAL

## 2022-05-27 SURGICAL SUPPLY — 47 items
APL SKNCLS STERI-STRIP NONHPOA (GAUZE/BANDAGES/DRESSINGS)
ARTICULEZE HEAD (Hips) ×2 IMPLANT
BAG COUNTER SPONGE SURGICOUNT (BAG) IMPLANT
BAG SPEC THK2 15X12 ZIP CLS (MISCELLANEOUS)
BAG SPNG CNTER NS LX DISP (BAG)
BAG ZIPLOCK 12X15 (MISCELLANEOUS) IMPLANT
BENZOIN TINCTURE PRP APPL 2/3 (GAUZE/BANDAGES/DRESSINGS) IMPLANT
BLADE SAW SGTL 18X1.27X75 (BLADE) ×2 IMPLANT
BLADE SURG SZ10 CARB STEEL (BLADE) ×4 IMPLANT
COVER PERINEAL POST (MISCELLANEOUS) ×2 IMPLANT
COVER SURGICAL LIGHT HANDLE (MISCELLANEOUS) ×2 IMPLANT
DRAPE FOOT SWITCH (DRAPES) ×2 IMPLANT
DRAPE STERI IOBAN 125X83 (DRAPES) ×2 IMPLANT
DRAPE U-SHAPE 47X51 STRL (DRAPES) ×4 IMPLANT
DRESSING AQUACEL AG SP 3.5X10 (GAUZE/BANDAGES/DRESSINGS) IMPLANT
DRSG AQUACEL AG ADV 3.5X 6 (GAUZE/BANDAGES/DRESSINGS) ×2 IMPLANT
DRSG AQUACEL AG SP 3.5X10 (GAUZE/BANDAGES/DRESSINGS) ×2
DURAPREP 26ML APPLICATOR (WOUND CARE) ×2 IMPLANT
ELECT BLADE TIP CTD 4 INCH (ELECTRODE) ×2 IMPLANT
ELECT REM PT RETURN 15FT ADLT (MISCELLANEOUS) ×2 IMPLANT
ELIMINATOR HOLE APEX DEPUY (Hips) ×1 IMPLANT
GAUZE XEROFORM 1X8 LF (GAUZE/BANDAGES/DRESSINGS) IMPLANT
GLOVE BIOGEL PI IND STRL 8 (GLOVE) ×2 IMPLANT
GLOVE BIOGEL PI INDICATOR 8 (GLOVE) ×2
GLOVE ECLIPSE 7.5 STRL STRAW (GLOVE) ×4 IMPLANT
GOWN STRL REUS W/ TWL XL LVL3 (GOWN DISPOSABLE) ×2 IMPLANT
GOWN STRL REUS W/TWL XL LVL3 (GOWN DISPOSABLE) ×4
HEAD ARTICULEZE (Hips) IMPLANT
HOLDER FOLEY CATH W/STRAP (MISCELLANEOUS) ×2 IMPLANT
HOOD PEEL AWAY FLYTE STAYCOOL (MISCELLANEOUS) ×4 IMPLANT
KIT TURNOVER KIT A (KITS) IMPLANT
LINER NEUTRAL 52MMX36MMX56N (Liner) ×1 IMPLANT
NEEDLE HYPO 22GX1.5 SAFETY (NEEDLE) ×2 IMPLANT
PACK ANTERIOR HIP CUSTOM (KITS) ×2 IMPLANT
PENCIL SMOKE EVACUATOR (MISCELLANEOUS) IMPLANT
PIN SECT CUP 56MM (Hips) ×1 IMPLANT
SPIKE FLUID TRANSFER (MISCELLANEOUS) ×2 IMPLANT
STAPLER VISISTAT 35W (STAPLE) IMPLANT
STEM FEMORAL SZ9 HIGH ACTIS (Stem) ×1 IMPLANT
STRIP CLOSURE SKIN 1/2X4 (GAUZE/BANDAGES/DRESSINGS) IMPLANT
SUT ETHIBOND NAB CT1 #1 30IN (SUTURE) ×4 IMPLANT
SUT MNCRL AB 3-0 PS2 18 (SUTURE) IMPLANT
SUT VIC AB 0 CT1 36 (SUTURE) ×2 IMPLANT
SUT VIC AB 1 CT1 36 (SUTURE) ×2 IMPLANT
SUT VIC AB 2-0 CT1 27 (SUTURE) ×2
SUT VIC AB 2-0 CT1 TAPERPNT 27 (SUTURE) ×1 IMPLANT
TRAY FOLEY MTR SLVR 16FR STAT (SET/KITS/TRAYS/PACK) ×2 IMPLANT

## 2022-05-27 NOTE — H&P (Signed)
TOTAL HIP ADMISSION H&P  Patient is admitted for left total hip arthroplasty.  Subjective:  Chief Complaint: left hip pain  HPI: Ricky Meyers, 76 y.o. male, has a history of pain and functional disability in the left hip(s) due to arthritis and patient has failed non-surgical conservative treatments for greater than 12 weeks to include NSAID's and/or analgesics, corticosteriod injections, flexibility and strengthening excercises, weight reduction as appropriate, and activity modification.  Onset of symptoms was gradual starting 5 years ago with gradually worsening course since that time.The patient noted no past surgery on the left hip(s).  Patient currently rates pain in the left hip at 10 out of 10 with activity. Patient has night pain, worsening of pain with activity and weight bearing, trendelenberg gait, pain that interfers with activities of daily living, and joint swelling. Patient has evidence of subchondral cysts, subchondral sclerosis, periarticular osteophytes, and joint space narrowing by imaging studies. This condition presents safety issues increasing the risk of falls. This patient has had  failure of all reasonable conservative care .  There is no current active infection.  Patient Active Problem List   Diagnosis Date Noted   Pre-operative respiratory examination 05/16/2022   S/P mitral valve clip implantation 01/24/2022   Coronary artery disease involving native coronary artery of native heart without angina pectoris 12/27/2021   Severe mitral regurgitation    S/P lumbar laminectomy 11/21/2021   COVID-19 virus infection 10/31/2021   Upper airway cough syndrome 12/04/2020   Pulmonary embolus ([place]) 06/15/2019   History of pulmonary embolism 06/15/2019   Osteoarthritis 10/14/2018   OSA (obstructive sleep apnea) 12/18/2017   Obesity (BMI 30.0-34.9) 06/17/2017   Snoring 06/17/2017   LBBB (left bundle branch block) 10/22/2016   DOE (dyspnea on exertion) 10/22/2016   Ectopic  beat, ventricular 10/22/2016   Anemia, iron deficiency 12/03/2014   Pseudogout of elbow 12/02/2014   Hyponatremia 12/02/2014   Constipation 12/02/2014   Suspected Septic arthritis 12/02/2014   Depression 03/04/2008   Hyperlipemia 06/30/2007   Gout 06/30/2007   Essential hypertension 06/30/2007   NECK PAIN 06/30/2007   PROSTATE CANCER, HX OF 06/30/2007   Past Medical History:  Diagnosis Date   Arthritis    neck and back    Chronic neck pain    Depression    ED (erectile dysfunction)    GERD (gastroesophageal reflux disease)    dysphagia   Gout    sees Dr. Leigh Aurora    Hyperlipidemia    Hypertension    OSA (obstructive sleep apnea) 12/18/2017   Severe with AHI at 70.1/hr and is now on CPAP at 11cm H2o   Prostate cancer [facility])    history of   Pulmonary embolism ([place]) 06/2019   S/P mitral valve clip implantation 01/24/2022   s/p Mitraclip XTW + NTW with Dr. Burt Knack and Dr. Ali Lowe   Sleep apnea    snoring/never checked    Past Surgical History:  Procedure Laterality Date   AMPUTATION Left 07/18/2018   Procedure: LEFT RING FINGER REVISION AMPUTATION;  Surgeon: Leanora Cover, MD;  Location: [place];  Service: Orthopedics;  Laterality: Left;   BACK SURGERY     CERVICAL FUSION  12/02/2007   per Dr. Sherley Bounds   COLONOSCOPY  03/11/2017   per Dr. Ardis Hughs, clear, no repeats needed    injection lower back Right 09/17/2016   IR IVC FILTER PLMT / S&I /IMG GUID/MOD SED  11/15/2021   IR IVC FILTER RETRIEVAL / S&I Burke Keels GUID/MOD SED  12/14/2021  IR RADIOLOGIST EVAL & MGMT  10/30/2021   LUMBAR LAMINECTOMY/DECOMPRESSION MICRODISCECTOMY Left 11/21/2021   Procedure: Laminectomy and Foraminotomy - left - Lumbar three-four;  Surgeon: [name], MD;  Location: [place];  Service: Neurosurgery;  Laterality: Left;   MITRAL VALVE REPAIR N/A 01/24/2022   Procedure: MITRAL VALVE REPAIR;  Surgeon: [name], MD;  Location: [place] [facility];  Service: Cardiovascular;  Laterality: N/A;    PROSTATECTOMY  2000   RIGHT/LEFT HEART CATH AND CORONARY ANGIOGRAPHY N/A 12/20/2021   Procedure: RIGHT/LEFT HEART CATH AND CORONARY ANGIOGRAPHY;  Surgeon: [name], MD;  Location: [place] [facility];  Service: Cardiovascular;  Laterality: N/A;   ROTATOR CUFF REPAIR Right 11/2017   TEE WITHOUT CARDIOVERSION N/A 01/01/2022   Procedure: TRANSESOPHAGEAL ECHOCARDIOGRAM (TEE);  Surgeon: [name], MD;  Location: [place];  Service: Cardiovascular;  Laterality: N/A;   TEE WITHOUT CARDIOVERSION N/A 01/24/2022   Procedure: TRANSESOPHAGEAL ECHOCARDIOGRAM (TEE);  Surgeon: [name], MD;  Location: [place] [facility];  Service: Cardiovascular;  Laterality: N/A;   TONSILLECTOMY     as a child   VASECTOMY      Current Facility-Administered Medications  Medication Dose Route Frequency Provider Last Rate Last Admin   bupivacaine liposome (EXPAREL) 1.3 % injection 133 mg  10 mL Other Once Dorna Leitz, MD       ceFAZolin (ANCEF) IVPB 2g/100 mL premix  2 g Intravenous On Call to OR Dorna Leitz, MD       lactated ringers infusion   Intravenous Continuous Nolon Nations, MD 10 mL/hr at 05/27/22 0549 New Bag at 05/27/22 0549   povidone-iodine 10 % swab 2 Application  2 Application Topical Once Dorna Leitz, MD       tranexamic acid (CYKLOKAPRON) IVPB 1,000 mg  1,000 mg Intravenous To OR Dorna Leitz, MD       No Known Allergies  Social History   Tobacco Use   Smoking status: Never   Smokeless tobacco: Never  Substance Use Topics   Alcohol use: Yes    Comment: 5 drinks/week    Family History  Problem Relation Age of Onset   Cancer Other        breast, porstate   Hypertension Other    Stroke Other    Breast cancer Mother    Diverticulitis Mother    Heart disease Father    Prostate cancer Father    Stroke Father    Dementia Sister    Colon cancer Neg Hx      Review of Systems ROS: I have reviewed the patient's review of systems thoroughly and there are no  positive responses as relates to the HPI.  Objective:  Physical Exam  Vital signs in last 24 hours: Temp:  [98.3 F (36.8 C)] 98.3 F (36.8 C) (07/24 0619) Pulse Rate:  [88] 88 (07/24 0619) Resp:  [18] 18 (07/24 0619) BP: (158)/(90) 158/90 (07/24 0619) SpO2:  [98 %] 98 % (07/24 0619) Weight:  [110 kg] 110 kg (07/24 0554) Well-developed well-nourished patient in no acute distress. Alert and oriented x3 HEENT:within normal limits Cardiac: Regular rate and rhythm Pulmonary: Lungs clear to auscultation Abdomen: Soft and nontender.  Normal active bowel sounds  Musculoskeletal: (Left hip: Painful range of motion.  Limited range of motion.  No instability.  Neurovascular intact distally.)  Labs: Recent Results (from the past 2160 hour(s))  Type and screen Order type and screen if day of surgery is less than 15 days from draw of  preadmission visit or order morning of surgery if day of surgery is greater than 6 days from preadmission visit.     Status: None   Collection Time: 05/15/22 11:03 AM  Result Value Ref Range   ABO/RH(D) A NEG    Antibody Screen NEG    Sample Expiration 05/29/2022,2359    Extend sample reason      NO TRANSFUSIONS OR PREGNANCY IN THE PAST 3 MONTHS Performed at [facility], [place] [address] [place], [place] [address]   Surgical pcr screen     Status: Abnormal   Collection Time: 05/15/22 11:04 AM   Specimen: Nasal Mucosa; Nasal Swab  Result Value Ref Range   MRSA, PCR NEGATIVE NEGATIVE   Staphylococcus aureus POSITIVE (A) NEGATIVE    Comment: (NOTE) The Xpert SA Assay (FDA approved for NASAL specimens in patients 86 years of age and older), is one component of a comprehensive surveillance program. It is not intended to diagnose infection nor to guide or monitor treatment. Performed at [facility], [place] [address] [place], [place] [address]   Basic metabolic panel per protocol     Status: Abnormal   Collection Time:  05/15/22 11:04 AM  Result Value Ref Range   Sodium 138 135 - 145 mmol/L   Potassium 4.5 3.5 - 5.1 mmol/L   Chloride 106 98 - 111 mmol/L   CO2 25 22 - 32 mmol/L   Glucose, Bld 105 (H) 70 - 99 mg/dL    Comment: Glucose reference range applies only to samples taken after fasting for at least 8 hours.   BUN 19 8 - 23 mg/dL   Creatinine, Ser 1.21 0.61 - 1.24 mg/dL   Calcium 10.0 8.9 - 10.3 mg/dL   GFR, Estimated >60 >60 mL/min    Comment: (NOTE) Calculated using the CKD-EPI Creatinine Equation (2021)    Anion gap 7 5 - 15    Comment: Performed at [facility], [place] [address] [place], [place] [address]  CBC per protocol     Status: Abnormal   Collection Time: 05/15/22 11:04 AM  Result Value Ref Range   WBC 7.4 4.0 - 10.5 K/uL   RBC 4.39 4.22 - 5.81 MIL/uL   Hemoglobin 13.8 13.0 - 17.0 g/dL   HCT 41.0 39.0 - 52.0 %   MCV 93.4 80.0 - 100.0 fL   MCH 31.4 26.0 - 34.0 pg   MCHC 33.7 30.0 - 36.0 g/dL   RDW 15.6 (H) 11.5 - 15.5 %   Platelets 166 150 - 400 K/uL   nRBC 0.0 0.0 - 0.2 %    Comment: Performed at [facility], [place] [address], [place],  [address]     Estimated body mass index is 32.9 kg/m as calculated from the following:   Height as of this encounter: 6' (1.829 m).   Weight as of this encounter: 110 kg.   Imaging Review Plain radiographs demonstrate severe degenerative joint disease of the left hip(s). The bone quality appears to be fair for age and reported activity level.      Assessment/Plan:  End stage arthritis, left hip(s)  The patient history, physical examination, clinical judgement of the provider and imaging studies are consistent with end stage degenerative joint disease of the left hip(s) and total hip arthroplasty is deemed medically necessary. The treatment options including medical management, injection therapy, arthroscopy and arthroplasty were discussed at length. The risks and benefits of total hip  arthroplasty were presented and reviewed.  The risks due to aseptic loosening, infection, stiffness, dislocation/subluxation,  thromboembolic complications and other imponderables were discussed.  The patient acknowledged the explanation, agreed to proceed with the plan and consent was signed. Patient is being admitted for inpatient treatment for surgery, pain control, PT, OT, prophylactic antibiotics, VTE prophylaxis, progressive ambulation and ADL's and discharge planning.The patient is planning to be discharged home with home health services

## 2022-05-27 NOTE — Anesthesia Preprocedure Evaluation (Signed)
Anesthesia Evaluation  Patient identified by MRN, date of birth, ID band Patient awake    Reviewed: Allergy & Precautions, NPO status , Patient's Chart, lab work & pertinent test results  History of Anesthesia Complications Negative for: history of anesthetic complications  Airway Mallampati: IV  TM Distance: >3 FB Neck ROM: Full    Dental  (+) Dental Advisory Given   Pulmonary neg shortness of breath, sleep apnea , neg COPD, neg recent URI, PE   breath sounds clear to auscultation       Cardiovascular hypertension, Pt. on medications (-) angina+ CAD and + DOE  (-) Past MI and (-) Cardiac Stents  Rhythm:Regular  1. Left ventricular ejection fraction, by estimation, is 60%%. The left  ventricle has normal function. Left ventricular diastolic parameters are  indeterminate.  2. Right ventricular systolic function is normal. The right ventricular  size is normal. There is normal pulmonary artery systolic pressure.  3. Left atrial size was mildly dilated.  4. S/p MitraClip XTW and NTW. (01/24/22). Mean gradient through the valve  is 4 mm Hg and MVA is calculated at 2.78 cm2 (HR 72 bpm). MR is very  eccentric, directed anteriorly along wall of left atrium, probably mild.  Compared to echo from March 2023, no  significant change . The mitral valve has been repaired/replaced. Mild  mitral valve regurgitation. Procedure Date: 01/24/2022.  5. Aortic valve regurgitation is trivial. Aortic valve sclerosis is  present, with no evidence of aortic valve stenosis.  6. The inferior vena cava is normal in size with greater than 50%  respiratory variability, suggesting right atrial pressure of 3 mmHg.    Neuro/Psych neg Seizures PSYCHIATRIC DISORDERS Depression  Neuromuscular disease    GI/Hepatic Neg liver ROS, GERD  Medicated and Controlled,  Endo/Other  negative endocrine ROS  Renal/GU Renal InsufficiencyRenal diseaseLab Results       Component                Value               Date                      CREATININE               1.21                05/15/2022                Musculoskeletal  (+) Arthritis ,   Abdominal   Peds  Hematology  (+) Blood dyscrasia, , Lab Results      Component                Value               Date                      WBC                      7.4                 05/15/2022                HGB                      13.8                05/15/2022  HCT                      41.0                05/15/2022                MCV                      93.4                05/15/2022                PLT                      166                 05/15/2022            eliquis s/p PE held 1 week, transitioned to Lovenox bid last dose 05/26/22 at 1900   Anesthesia Other Findings   Reproductive/Obstetrics                             Anesthesia Physical Anesthesia Plan  ASA: 3  Anesthesia Plan: General   Post-op Pain Management: Ofirmev IV (intra-op)* and Toradol IV (intra-op)*   Induction: Intravenous  PONV Risk Score and Plan: 2 and Ondansetron and Dexamethasone  Airway Management Planned: Oral ETT  Additional Equipment: None  Intra-op Plan:   Post-operative Plan: Extubation in OR  Informed Consent: I have reviewed the patients History and Physical, chart, labs and discussed the procedure including the risks, benefits and alternatives for the proposed anesthesia with the patient or authorized representative who has indicated his/her understanding and acceptance.     Dental advisory given  Plan Discussed with: CRNA  Anesthesia Plan Comments:         Anesthesia Quick Evaluation

## 2022-05-27 NOTE — Discharge Instructions (Signed)

## 2022-05-27 NOTE — Transfer of Care (Signed)
Immediate Anesthesia Transfer of Care Note  Patient: Ricky Meyers  Procedure(s) Performed: TOTAL HIP ARTHROPLASTY ANTERIOR APPROACH (Left: Hip)  Patient Location: PACU  Anesthesia Type:General  Level of Consciousness: awake, alert , oriented and patient cooperative  Airway & Oxygen Therapy: Patient Spontanous Breathing and Patient connected to nasal cannula oxygen  Post-op Assessment: Report given to RN and Post -op Vital signs reviewed and stable  Post vital signs: Reviewed and stable  Last Vitals:  Vitals Value Taken Time  BP 131/65 05/27/22 0938  Temp 37.5 C 05/27/22 0938  Pulse 88 05/27/22 0942  Resp 22 05/27/22 0943  SpO2 96 % 05/27/22 0942  Vitals shown include unvalidated device data.  Last Pain:  Vitals:   05/27/22 0619  TempSrc: Oral  PainSc:       Patients Stated Pain Goal: 3 (45/62/56 3893)  Complications: No notable events documented.

## 2022-05-27 NOTE — Evaluation (Signed)
Physical Therapy Evaluation Patient Details Name: Ricky Meyers MRN: [id number] DOB: 1946-02-19 Today's Date: 05/27/2022  History of Present Illness  76 yo male S/P L THA, through direct anterior approach. PMH: PE, OSA, MVR. HTN,back surceries, IVC filter and retrieval.  Clinical Impression  The patient  reports 2/10 left hip/incision pain, Patient ambulated using RW and min assist for safety. Family present and aware of safety guarding on steps and ambulating in home.sign .  Patient tolerated well, no dizziness. Patient ready to Dc home, to start OPPT 7/26.     Recommendations for follow up therapy are one component of a multi-disciplinary discharge planning process, led by the attending physician.  Recommendations may be updated based on patient status, additional functional criteria and insurance authorization.  Follow Up Recommendations Follow physician's recommendations for discharge plan and follow up therapies      Assistance Recommended at Discharge Frequent or constant Supervision/Assistance  Patient can return home with the following  A little help with walking and/or transfers;Assistance with cooking/housework;Assist for transportation;A little help with bathing/dressing/bathroom;Help with stairs or ramp for entrance    Equipment Recommendations Rolling walker (2 wheels)  Recommendations for Other Services       Functional Status Assessment Patient has had a recent decline in their functional status and demonstrates the ability to make significant improvements in function in a reasonable and predictable amount of time.     Precautions / Restrictions Precautions Precautions: Fall Restrictions Weight Bearing Restrictions: No      Mobility  Bed Mobility Overal bed mobility: Needs Assistance Bed Mobility: Supine to Sit, Sit to Supine     Supine to sit: Min guard Sit to supine: Min assist   General bed mobility comments: assist  with LLE off and onto the  bed     Transfers Overall transfer level: Needs assistance Equipment used: Rolling walker (2 wheels) Transfers: Sit to/from Stand Sit to Stand: Min assist           General transfer comment: cues for safety and hand placement    Ambulation/Gait Ambulation/Gait assistance: Min guard Gait Distance (Feet): 120 Feet Assistive device: Rolling walker (2 wheels) Gait Pattern/deviations: Step-to pattern, Step-through pattern Gait velocity: decreased     General Gait Details: cues to roll RW, not pick up. slight balance  backward when first picked up RW.  Stairs Stairs: Yes Stairs assistance: Min assist Stair Management: One rail Left, Forwards Number of Stairs: 3 General stair comments: spouse present to  asssist with safety. simulated  with use of rail and cane  Wheelchair Mobility    Modified Rankin (Stroke Patients Only)       Balance Overall balance assessment: Needs assistance Sitting-balance support: Bilateral upper extremity supported, Feet supported Sitting balance-Leahy Scale: Good     Standing balance support: During functional activity, Bilateral upper extremity supported, Reliant on assistive device for balance Standing balance-Leahy Scale: Fair Standing balance comment: able to stand to urinate                             Pertinent Vitals/Pain Pain Assessment Pain Assessment: 0-10 Pain Score: 2  Pain Location: left anterior thigh Pain Descriptors / Indicators: Discomfort    Home Living Family/patient expects to be discharged to:: Private residence Living Arrangements: Spouse/significant other Available Help at Discharge: Family;Available 24 hours/day Type of Home: House Home Access: Stairs to enter Entrance Stairs-Rails: Psychiatric nurse of Steps: 4   Home Layout: Two level;Able to  live on main level with bedroom/bathroom Home Equipment: None;Cane - single point      Prior Function Prior Level of Function :  Independent/Modified Independent                     Hand Dominance   Dominant Hand: Right    Extremity/Trunk Assessment   Upper Extremity Assessment Upper Extremity Assessment: Overall WFL for tasks assessed    Lower Extremity Assessment Lower Extremity Assessment: Overall WFL for tasks assessed    Cervical / Trunk Assessment Cervical / Trunk Assessment: Normal  Communication   Communication: No difficulties  Cognition Arousal/Alertness: Awake/alert Behavior During Therapy: WFL for tasks assessed/performed, Impulsive Overall Cognitive Status: Within Functional Limits for tasks assessed                                 General Comments: cues for safety        General Comments      Exercises Total Joint Exercises Ankle Circles/Pumps: AROM, Both, 10 reps Long Arc Quad: AROM, Both, 10 reps, Seated   Assessment/Plan    PT Assessment All further PT needs can be met in the next venue of care  PT Problem List Decreased strength;Decreased mobility;Decreased safety awareness;Decreased range of motion;Decreased knowledge of precautions;Decreased activity tolerance;Pain       PT Treatment Interventions DME instruction;Therapeutic activities;Gait training;Therapeutic exercise;Patient/family education;Functional mobility training;Stair training    PT Goals (Current goals can be found in the Care Plan section)  Acute Rehab PT Goals Patient Stated Goal: to  go home PT Goal Formulation: All assessment and education complete, DC therapy    Frequency       Co-evaluation               AM-PAC PT "6 Clicks" Mobility  Outcome Measure Help needed turning from your back to your side while in a flat bed without using bedrails?: A Little Help needed moving from lying on your back to sitting on the side of a flat bed without using bedrails?: A Little Help needed moving to and from a bed to a chair (including a wheelchair)?: A Little Help needed standing  up from a chair using your arms (e.g., wheelchair or bedside chair)?: A Little Help needed to walk in hospital room?: A Little Help needed climbing 3-5 steps with a railing? : A Little 6 Click Score: 18    End of Session Equipment Utilized During Treatment: Gait belt Activity Tolerance: Patient tolerated treatment well Patient left: in bed;with call bell/phone within reach;with family/visitor present Nurse Communication: Mobility status PT Visit Diagnosis: Unsteadiness on feet (R26.81);Pain;Difficulty in walking, not elsewhere classified (R26.2) Pain - Right/Left: Right Pain - part of body: Hip    Time: 8257-4935 PT Time Calculation (min) (ACUTE ONLY): 27 min   Charges:   PT Evaluation $PT Eval Low Complexity: 1 Low PT Treatments $Gait Training: 8-22 mins       [place] Office [phone] Weekend [phone]   Claretha Cooper 05/27/2022, 2:17 PM

## 2022-05-27 NOTE — Op Note (Signed)
PATIENT ID:      Ricky Meyers  MRN:     [id number] DOB/AGE:    12-13-1945 / 76 y.o.       OPERATIVE REPORT    DATE OF PROCEDURE:  05/27/2022       PREOPERATIVE DIAGNOSIS:  LEFT HIP DEGENERATIVE JOINT DISEASE                                                       Estimated body mass index is 32.9 kg/m as calculated from the following:   Height as of this encounter: 6' (1.829 m).   Weight as of this encounter: 110 kg.     POSTOPERATIVE DIAGNOSIS:  LEFT HIP DEGENERATIVE JOINT DISEASE                                                           PROCEDURE:  1. left total hip arthroplasty using a 56 mm DePuy Pinnacle  Cup, [employer],  neutral liner, a +1.5 36 mm metal head,  and a #9  Actis high offset stem, 2.interpretation of multiple intraoperative fluoroscopic images   SURGEON: Alta Corning    ASSISTANT:   eric PhillipsPA-C  (present throughout entire procedure and necessary for timely completion of the procedure)  ANESTHESIA: general  BLOOD LOSS: 350 cc Tranexamic Acid: 1 gram IV DRAINS: None COMPLICATIONS: None    NDICATIONS FOR PROCEDURE:Patient with end-stage arthritis of the right hip.  X-rays show bone-on-bone arthritic changes. Despite conservative measures with observation, anti-inflammatory medicine, narcotics, use of a cane, has severe unremitting pain and can ambulate only less than 1 block before resting.  Patient desires elective right total hip arthroplasty to decrease pain and increase function. The risks, benefits, and alternatives were discussed at length including but not limited to the risks of infection, bleeding, nerve injury, stiffness, blood clots, the need for revision surgery, cardiopulmonary complications, among others, and they were willing to proceed.Benefits have been discussed. Questions answered.     PROCEDURE IN DETAIL: The patient was identified by armband,  received preoperative IV antibiotics in the holding area at [facility],  taken to the operating room , appropriate anesthetic monitors  were attached and spinal anesthesia was induced.  The patient was placed onto the hot bed and all bony prominences were well-padded.The right hip was prepped and draped for an anterior approach to the hip.  An incision was made and the subcutaneous dissection was down to the level of the tensor fascia.  The fascia was opened and finger dissected.  The bleeders coming across the anterior portion of the hip were identified and cauterized. Retractors were put in place above and below the femoral neck.  The capsule was opened and tagged and a provisional neck cut was made.  The head was removed and sized on the back table.  The acetabulum was sequentially reamed to a level of 55 mm and a 56 mm porous-coated pinnacle cup was hammered into place with 45 of lateral opening and 30 of anteversion.fluoroscopy was used to ensure this position of the cup.  Attention was turned towards the femur where the leg  was actually rotated, extended, and adduction did.  The femur was sequentially broached until a size of 9 broach gave a perfect fit and fill.at this point a  1.5 mm metal hip ball was placed and the hip reduced.  Fluoroscopic images were taken to assess the leg length, fit and fill of the stem, and cup position.  We were happy with the construct at this point.  The 9 broach was removed and a final Actis stem with high offset  and a 1.5 mm metal hip ball was placed and reduced.  Final images were taken to make certain there were happy with the position at this point.   The capsule was closed with #1 Vicryl suture.  The tensor fascia was closed with 0 Vicryl suture.  The skin was then closed with combination of 0 and 2-0 Vicryl suture.  The top layer was with 3-0 Monocryl suture.  Benzoin and Steri-Strips were applied  and a sterile compressive dressing was applied and the patient taken to recovery room she noted be in satisfactory condition.  Past medical  Motion for the procedure was approximately 300 cc.  Of note Ricky Meyers was with the entire case and assisted by retraction of tissues, manipulation of the leg, and closing the minimize or time.    Alta Corning 04/29/2021, 6:15 PM  Alta Corning 05/27/2022, 9:05 AM

## 2022-05-27 NOTE — Anesthesia Procedure Notes (Signed)
Procedure Name: Intubation Date/Time: 05/27/2022 7:34 AM  Performed by: Cleda Daub, CRNAPre-anesthesia Checklist: Patient identified, Emergency Drugs available, Suction available and Patient being monitored Patient Re-evaluated:Patient Re-evaluated prior to induction Oxygen Delivery Method: Circle system utilized Preoxygenation: Pre-oxygenation with 100% oxygen Induction Type: IV induction Laryngoscope Size: Mac and 4 Grade View: Grade I Tube type: Oral Tube size: 7.5 mm Number of attempts: 1 Airway Equipment and Method: Stylet and Oral airway Placement Confirmation: ETT inserted through vocal cords under direct vision, positive ETCO2 and breath sounds checked- equal and bilateral Secured at: 22 cm Tube secured with: Tape Dental Injury: Teeth and Oropharynx as per pre-operative assessment

## 2022-05-29 ENCOUNTER — Encounter (HOSPITAL_COMMUNITY): Payer: Self-pay | Admitting: Orthopedic Surgery

## 2022-05-29 DIAGNOSIS — Z96642 Presence of left artificial hip joint: Secondary | ICD-10-CM | POA: Diagnosis not present

## 2022-05-29 DIAGNOSIS — M25652 Stiffness of left hip, not elsewhere classified: Secondary | ICD-10-CM | POA: Diagnosis not present

## 2022-05-29 DIAGNOSIS — M6281 Muscle weakness (generalized): Secondary | ICD-10-CM | POA: Diagnosis not present

## 2022-05-29 NOTE — Anesthesia Postprocedure Evaluation (Signed)
Anesthesia Post Note  Patient: Ricky Meyers  Procedure(s) Performed: TOTAL HIP ARTHROPLASTY ANTERIOR APPROACH (Left: Hip)     Patient location during evaluation: PACU Anesthesia Type: General Level of consciousness: awake and alert Pain management: pain level controlled Vital Signs Assessment: post-procedure vital signs reviewed and stable Respiratory status: spontaneous breathing, nonlabored ventilation, respiratory function stable and patient connected to nasal cannula oxygen Cardiovascular status: blood pressure returned to baseline and stable Postop Assessment: no apparent nausea or vomiting Anesthetic complications: no   No notable events documented.  Last Vitals:  Vitals:   05/27/22 1252 05/27/22 1300  BP: 120/76   Pulse: 93   Resp:  20  Temp: 37.2 C 37.2 C  SpO2: 94% 92%    Last Pain:  Vitals:   05/27/22 1300  TempSrc:   PainSc: 2                  Nancylee Gaines

## 2022-06-03 DIAGNOSIS — M25652 Stiffness of left hip, not elsewhere classified: Secondary | ICD-10-CM | POA: Diagnosis not present

## 2022-06-03 DIAGNOSIS — M6281 Muscle weakness (generalized): Secondary | ICD-10-CM | POA: Diagnosis not present

## 2022-06-03 DIAGNOSIS — Z96642 Presence of left artificial hip joint: Secondary | ICD-10-CM | POA: Diagnosis not present

## 2022-06-05 DIAGNOSIS — M6281 Muscle weakness (generalized): Secondary | ICD-10-CM | POA: Diagnosis not present

## 2022-06-05 DIAGNOSIS — M25652 Stiffness of left hip, not elsewhere classified: Secondary | ICD-10-CM | POA: Diagnosis not present

## 2022-06-05 DIAGNOSIS — Z96642 Presence of left artificial hip joint: Secondary | ICD-10-CM | POA: Diagnosis not present

## 2022-06-07 DIAGNOSIS — M25652 Stiffness of left hip, not elsewhere classified: Secondary | ICD-10-CM | POA: Diagnosis not present

## 2022-06-07 DIAGNOSIS — Z96642 Presence of left artificial hip joint: Secondary | ICD-10-CM | POA: Diagnosis not present

## 2022-06-07 DIAGNOSIS — M6281 Muscle weakness (generalized): Secondary | ICD-10-CM | POA: Diagnosis not present

## 2022-06-11 DIAGNOSIS — M25652 Stiffness of left hip, not elsewhere classified: Secondary | ICD-10-CM | POA: Diagnosis not present

## 2022-06-11 DIAGNOSIS — Z96642 Presence of left artificial hip joint: Secondary | ICD-10-CM | POA: Diagnosis not present

## 2022-06-11 DIAGNOSIS — M6281 Muscle weakness (generalized): Secondary | ICD-10-CM | POA: Diagnosis not present

## 2022-06-11 DIAGNOSIS — Z471 Aftercare following joint replacement surgery: Secondary | ICD-10-CM | POA: Diagnosis not present

## 2022-06-14 DIAGNOSIS — Z96642 Presence of left artificial hip joint: Secondary | ICD-10-CM | POA: Diagnosis not present

## 2022-06-14 DIAGNOSIS — M6281 Muscle weakness (generalized): Secondary | ICD-10-CM | POA: Diagnosis not present

## 2022-06-14 DIAGNOSIS — M25652 Stiffness of left hip, not elsewhere classified: Secondary | ICD-10-CM | POA: Diagnosis not present

## 2022-06-17 ENCOUNTER — Telehealth: Payer: Self-pay | Admitting: Pharmacist

## 2022-06-17 DIAGNOSIS — M25652 Stiffness of left hip, not elsewhere classified: Secondary | ICD-10-CM | POA: Diagnosis not present

## 2022-06-17 DIAGNOSIS — Z96642 Presence of left artificial hip joint: Secondary | ICD-10-CM | POA: Diagnosis not present

## 2022-06-17 DIAGNOSIS — M6281 Muscle weakness (generalized): Secondary | ICD-10-CM | POA: Diagnosis not present

## 2022-06-17 NOTE — Chronic Care Management (AMB) (Signed)
    Chronic Care Management Pharmacy Assistant   Name: Ricky Meyers  MRN: [id number] DOB: 05/24/1946  06/18/2022 APPOINTMENT REMINDER  [name] was reminded to have all medications, supplements and any blood glucose and blood pressure readings available for review with Jeni Salles, Pharm. D, at his telephone visit on 06/18/2022 at 4:00.  Care Gaps: AWV - scheduled 11/18/2022 Last BP - 120/70 on 05/16/2022 Hep C Screen - never done Flu - overdue Covid booster - Postponed  Star Rating Drug: Atrovastatin 40 mg - last filled 04/17/2022 90 DS at [facility] Telmisartan HCTZ 80/25 mg - last filled 04/17/2022 60 DS at [place]  Any gaps in medications fill history? No  [name]  Catering manager [phone]

## 2022-06-18 ENCOUNTER — Ambulatory Visit (INDEPENDENT_AMBULATORY_CARE_PROVIDER_SITE_OTHER): Payer: Medicare Other | Admitting: Pharmacist

## 2022-06-18 ENCOUNTER — Other Ambulatory Visit: Payer: Self-pay | Admitting: Internal Medicine

## 2022-06-18 DIAGNOSIS — I10 Essential (primary) hypertension: Secondary | ICD-10-CM

## 2022-06-18 DIAGNOSIS — M159 Polyosteoarthritis, unspecified: Secondary | ICD-10-CM

## 2022-06-18 NOTE — Patient Instructions (Signed)
Hi [place],  It was great to catch up with you again! Just remember, slow and steady wins the race! I hope you feel better soon.  Don't forget to start back on checking your blood pressure regularly at home so we can make sure your medications are working properly.  Please reach out to me if you have any questions or need anything before our follow up!  Best, Maddie  Jeni Salles, PharmD, [facility]   Visit Information   Goals Addressed   None    Patient Care Plan: CCM Pharmacy Care Plan     Problem Identified: Problem: Hypertension, Hyperlipidemia, Depression, Gout, and Pain, History of PE      Long-Range Goal: Patient-Specific Goal   Start Date: 12/19/2021  Expected End Date: 12/19/2022  Recent Progress: On track  Priority: High  Note:   Current Barriers:  Unable to independently monitor therapeutic efficacy  Pharmacist Clinical Goal(s):  Patient will achieve adherence to monitoring guidelines and medication adherence to achieve therapeutic efficacy through collaboration with PharmD and provider.   Interventions: 1:1 collaboration with [name], MD regarding development and update of comprehensive plan of care as evidenced by provider attestation and co-signature Inter-disciplinary care team collaboration (see longitudinal plan of care) Comprehensive medication review performed; medication list updated in electronic medical record  Hypertension (BP goal <130/80) -Controlled -Current treatment: Telmisartan-HCTZ 80-25 mg 1 tablet daily - Appropriate, Effective, Safe, Accessible -Medications previously tried: amlodipine (hypotension)  -Current home readings: could not provide readings; has not been checking lately -Current dietary habits: did not discuss -Current exercise habits: did not discuss -Denies hypotensive/hypertensive symptoms -Educated on BP goals and benefits of medications for  prevention of heart attack, stroke and kidney damage; Exercise goal of 150 minutes per week; Importance of home blood pressure monitoring; -Counseled to monitor BP at home weekly, document, and provide log at future appointments -Recommended to continue current medication  Hyperlipidemia: (LDL goal < 70) -Not ideally controlled -Current treatment: Atorvastatin 40 mg 1 tablet daily - Appropriate, Effective, Safe, Accessible -Medications previously tried: none  -Current dietary patterns: did not discuss -Current exercise habits: did not discuss -Educated on Cholesterol goals;  Benefits of statin for ASCVD risk reduction; -Counseled on diet and exercise extensively Recommended to continue current medication  Depression/Anxiety (Goal: minimize symptoms) -Controlled -Current treatment: Paroxetine 20 mg 1 tablet daily - Appropriate, Effective, Safe, Accessible Diazepam 5 mg 1 tablet prior to MRIs - Appropriate, Effective, Safe, Accessible -Medications previously tried/failed: none -PHQ9: 0 -GAD7: n/a -Educated on Benefits of medication for symptom control Benefits of cognitive-behavioral therapy with or without medication -Recommended to continue current medication  GERD (Goal: minimize symptoms) -Controlled -Current treatment  Famotidine 20 mg 1 tablet daily after supper - Appropriate, Effective, Safe, Accessible -Medications previously tried: none  -Recommended to continue current medication  History of PE (Goal: prevent blood clots) -Controlled -Current treatment  Eliquis 5 mg 1 tablet twice daily - Appropriate, Effective, Safe, Accessible -Medications previously tried: Xarelto (cost/denied by PAP)  -Recommended to continue current medication  Pain (Goal: minimize pain) -Controlled -Current treatment  Cyclobenzaprine 10 mg 1 tablet 3 times daily as needed - Appropriate, Effective, Safe, Accessible Oxycodone-APAP 10-325 mg 1 tablet every 6 hours as needed - Appropriate,  Effective, Safe, Accessible -Medications previously tried: none  -Recommended to continue current medication  Gout (Goal: prevent flare ups and uric acid < 6) -Controlled -Current treatment  Allopurinol 300 mg 1 tablet daily - Appropriate, Effective, Safe,  Accessible -Medications previously tried: none  -Recommended to continue current medication   Health Maintenance -Vaccine gaps: COVID booster -Current therapy:  None -Educated on Cost vs benefit of each product must be carefully weighed by individual consumer -Patient is satisfied with current therapy and denies issues -Recommended to continue as is.  Patient Goals/Self-Care Activities Patient will:  - take medications as prescribed as evidenced by patient report and record review check blood pressure weekly, document, and provide at future appointments  Follow Up Plan: Telephone follow up appointment with care management team member scheduled for: 6 months       Patient verbalizes understanding of instructions and care plan provided today and agrees to view in [place]. Active MyChart status and patient understanding of how to access instructions and care plan via MyChart confirmed with patient.    Telephone follow up appointment with pharmacy team member scheduled for: 6 months  Viona Gilmore, [facility]

## 2022-06-18 NOTE — Progress Notes (Signed)
Chronic Care Management Pharmacy Note  06/18/2022 Name:  Ricky Meyers MRN:  [id number] DOB:  1946/01/01  Summary: BP is mostly at goal < 130/80 per home readings  Recommendations/Changes made from today's visit: -Recommended restarting BP monitoring at home weekly -Schedule CPE in October   Plan: BP assessment in 1-2 months Re-apply for PAP in late November Follow up in 6 months  Subjective: Ricky Meyers is an 76 y.o. year old male who is a primary patient of [name], MD.  The CCM team was consulted for assistance with disease management and care coordination needs.    Engaged with patient by telephone for follow up visit in response to provider referral for pharmacy case management and/or care coordination services.   Consent to Services:  The patient was given information about Chronic Care Management services, agreed to services, and gave verbal consent prior to initiation of services.  Please see initial visit note for detailed documentation.   Patient Care Team: [name], MD as PCP - General [name], MD as PCP - Cardiology (Cardiology) [name], MD as PCP - Structural Heart (Cardiology) Viona Gilmore, [facility] as Pharmacist (Pharmacist)  Recent office visits: 05/13/22 Alysia Penna, MD: Patient presented for pain management visit. Refilled oxycodone/APAP.  04/25/22 Alysia Penna, MD: Patient presented for history of PE. Plan for Lovenox bridge prior to procedure and holding Eliquis 7 days.  03/29/2022 Alysia Penna MD - Patient was seen for left hip pain. Referral to Orthopedic Surgery. No medication changes. No follow up noted.    02/15/2022 Alysia Penna MD: Patient was seen for chronic narcotic use. Refilled Percocet 10/325 mg 1 tablet every 6 hours prn.  Recent consult visits: 05/16/22 Geraldo Pitter, NP (pulmonary): Patient presented for OSA follow up and surgical clearance.  Follow up in 3 months.  05/02/22 Nicholes Rough, PA-C  (cardiology): Patient presented for pre-op exam.  02/25/2022 Kathyrn Drown NP (cardiology) - Patient was seen for S/P mitral valve clip implantation and additional issues. Started Amoxicillin 2000 mg prior to dental work. Follow up in 6 months.    02/21/2022 Clydell Hakim MD (neurosurgery) - Patient was seen for Body mass index [BMI] 32.0-32.9, adult and Lumbar radiculopathy. No other chart notes.    02/04/2022 Kathyrn Drown NP (cardiology) - Patient was seen for S/P mitral valve clip implantation and additional issues. No medication changes. Follow up as scheduled.    01/24/2022 Lenna Sciara MD (cardiology) - Patient was seen for a procedure, Mitral Valve Repair.   12/07/21 Lenna Sciara, MD (cardiology): Patient presented for severe mitral regurgitation follow up. Plan for cath.  Hospital visits: 05/27/22 Patient admitted to [facility] for 9 hours for left total hip arthroplasty.  Admitted to [facility] on 01/24/2022 due to S/P mitral valve clip implantation. Discharge date was 01/25/2022.   New?Medications Started at [facility] Discharge:?? No medications started Medication Changes at Hospital Discharge: No medication changes Medications Discontinued at Hospital Discharge: No medications discontinued Medications that remain the same after Hospital Discharge:??  -All other medications will remain the same.       Admitted to [facility] on 01/01/2022 (3 hours) due to transesophageal echocardiogram.  New?Medications Started at [facility] Discharge:?? No medications started Medication Changes at Hospital Discharge: No medication changes Medications Discontinued at Hospital Discharge: No medications discontinued Medications that remain the same after Hospital Discharge:??  -All other medications will remain the same.        Admitted to  [facility] on 12/20/2021 (5hours) due to Nonrhrumatic mitral valve regurgitation.     New?Medications Started at [facility] Discharge:?? No medications started Medication Changes at Hospital Discharge: No medication changes Medications Discontinued at Hospital Discharge: diazepam 5 MG tablet (VALIUM) Medications that remain the same after Hospital Discharge:??  -All other medications will remain the same.    Objective:  Lab Results  Component Value Date   CREATININE 1.21 05/15/2022   BUN 19 05/15/2022   GFR 55.52 (L) 08/23/2021   EGFR 81 12/07/2021   GFRNONAA >60 05/15/2022   GFRAA >60 06/17/2019   NA 138 05/15/2022   K 4.5 05/15/2022   CALCIUM 10.0 05/15/2022   CO2 25 05/15/2022   GLUCOSE 105 (H) 05/15/2022    Lab Results  Component Value Date/Time   HGBA1C 5.6 08/23/2021 09:40 AM   GFR 55.52 (L) 08/23/2021 09:40 AM   GFR 70.13 07/03/2020 09:54 AM    Last diabetic Eye exam: No results found for: "HMDIABEYEEXA"  Last diabetic Foot exam: No results found for: "HMDIABFOOTEX"   Lab Results  Component Value Date   CHOL 153 08/23/2021   HDL 65.20 08/23/2021   LDLCALC 71 08/23/2021   LDLDIRECT 161.4 09/29/2012   TRIG 84.0 08/23/2021   CHOLHDL 2 08/23/2021       Latest Ref Rng & Units 01/22/2022    2:30 PM 08/23/2021    9:40 AM 08/22/2020   12:08 PM  Hepatic Function  Total Protein 6.5 - 8.1 g/dL 6.8  6.7  7.1   Albumin 3.5 - 5.0 g/dL 3.9  4.2    AST 15 - 41 U/L '28  17  21   ' ALT 0 - 44 U/L '24  15  23   ' Alk Phosphatase 38 - 126 U/L 59  58    Total Bilirubin 0.3 - 1.2 mg/dL 1.1  1.1  0.8   Bilirubin, Direct 0.0 - 0.3 mg/dL  0.2  0.2     Lab Results  Component Value Date/Time   TSH 1.32 08/23/2021 09:40 AM   TSH 1.21 08/22/2020 12:08 PM       Latest Ref Rng & Units 05/15/2022   11:04 AM 01/25/2022   12:40 AM 01/22/2022    2:30 PM  CBC  WBC 4.0 - 10.5 K/uL 7.4  9.8  6.1   Hemoglobin 13.0 - 17.0 g/dL 13.8  12.2  12.9   Hematocrit 39.0 - 52.0 % 41.0  36.3  38.8   Platelets 150 - 400 K/uL 166  179  202     No results found for:  "VD25OH"  Clinical ASCVD: No  The 10-year ASCVD risk score (Arnett DK, et al., 2019) is: 27.1%   Values used to calculate the score:     Age: 44 years     Sex: Male     Is Non-Hispanic African American: No     Diabetic: No     Tobacco smoker: No     Systolic Blood Pressure: 537 mmHg     Is BP treated: Yes     HDL Cholesterol: 65.2 mg/dL     Total Cholesterol: 153 mg/dL       02/15/2022    1:57 PM 11/12/2021   10:47 AM 11/08/2020    9:03 AM  Depression screen PHQ 2/9  Decreased Interest 0 0 0  Down, Depressed, Hopeless 0 0 0  PHQ - 2 Score 0 0 0  Altered sleeping 0    Tired, decreased energy 2    Change  in appetite 0    Feeling bad or failure about yourself  0    Trouble concentrating 0    Moving slowly or fidgety/restless 0    Suicidal thoughts 0    PHQ-9 Score 2    Difficult doing work/chores Not difficult at all       Social History   Tobacco Use  Smoking Status Never  Smokeless Tobacco Never   BP Readings from Last 3 Encounters:  05/27/22 120/76  05/16/22 120/70  05/15/22 111/75   Pulse Readings from Last 3 Encounters:  05/27/22 93  05/16/22 90  05/15/22 82   Wt Readings from Last 3 Encounters:  05/27/22 242 lb 9.6 oz (110 kg)  05/16/22 242 lb 9.6 oz (110 kg)  05/15/22 240 lb 8 oz (109.1 kg)   BMI Readings from Last 3 Encounters:  05/27/22 32.90 kg/m  05/16/22 32.90 kg/m  05/15/22 32.62 kg/m    Assessment/Interventions: Review of patient past medical history, allergies, medications, health status, including review of consultants reports, laboratory and other test data, was performed as part of comprehensive evaluation and provision of chronic care management services.   SDOH:  (Social Determinants of Health) assessments and interventions performed: Yes SDOH Interventions    Flowsheet Row Most Recent Value  SDOH Interventions   Financial Strain Interventions Intervention Not Indicated      SDOH Screenings   Alcohol Screen: Low Risk   (04/25/2022)   Alcohol Screen    Last Alcohol Screening Score (AUDIT): 4  Depression (PHQ2-9): Low Risk  (02/15/2022)   Depression (PHQ2-9)    PHQ-2 Score: 2  Financial Resource Strain: Low Risk  (06/18/2022)   Overall Financial Resource Strain (CARDIA)    Difficulty of Paying Living Expenses: Not very hard  Food Insecurity: No Food Insecurity (04/25/2022)   Hunger Vital Sign    Worried About Running Out of Food in the Last Year: Never true    Mineral in the Last Year: Never true  Housing: Low Risk  (04/25/2022)   Housing    Last Housing Risk Score: 0  Physical Activity: Inactive (04/25/2022)   Exercise Vital Sign    Days of Exercise per Week: 0 days    Minutes of Exercise per Session: 0 min  Social Connections: Unknown (04/25/2022)   Social Connection and Isolation Panel [NHANES]    Frequency of Communication with Friends and Family: More than three times a week    Frequency of Social Gatherings with Friends and Family: More than three times a week    Attends Religious Services: Patient refused    Active Member of Clubs or Organizations: Patient refused    Attends Archivist Meetings: Not on file    Marital Status: Divorced  Stress: No Stress Concern Present (11/12/2021)   [employer] [place] of Stress : Not at all  Tobacco Use: Low Risk  (05/29/2022)   Patient History    Smoking Tobacco Use: Never    Smokeless Tobacco Use: Never    Passive Exposure: Not on file  Transportation Needs: No Transportation Needs (04/25/2022)   PRAPARE - Transportation    Lack of Transportation (Medical): No    Lack of Transportation (Non-Medical): No    CCM Care Plan  No Known Allergies  Medications Reviewed Today     Reviewed by Sheralyn Boatman, RN (Registered Nurse) on 05/27/22 at [phone]  Med List Status: Complete   Medication Order Taking? Sig Documenting  Provider Last Dose Status Informant  allopurinol  (ZYLOPRIM) 300 MG tablet [id number] Yes TAKE 1 TABLET BY MOUTH EVERY DAY [name], MD 05/27/2022 Active Spouse/Significant Other  amoxicillin (AMOXIL) 500 MG capsule [id number] Yes Take 4 capsules (2,000 mg total) by mouth as directed. Take (4) capsules 1 hour before dental work Burnell Blanks, MD  Active Spouse/Significant Other           Med Note Ramon Dredge, JESSICA K   Mon May 27, 2022  5:51 AM) Hasnt used  apixaban (ELIQUIS) 5 MG TABS tablet [id number] Yes Take 1 tablet (5 mg total) by mouth 2 (two) times daily. [name], MD 05/20/2022 Active Spouse/Significant Other  atorvastatin (LIPITOR) 40 MG tablet [id number] Yes TAKE 1 TABLET BY MOUTH EVERY DAY [name], MD  Active Spouse/Significant Other  cyclobenzaprine (FLEXERIL) 10 MG tablet [id number] Yes TAKE 1 TABLET BY MOUTH 3 TIMES DAILY AS NEEDED FOR MUSCLE SPASMS [name], MD  Active Spouse/Significant Other  enoxaparin (LOVENOX) 100 MG/ML injection [id number] Yes Take one injection twice daily beginning 7 days before surgery and ending the evening before surgery [name], MD 05/26/2022 Active Spouse/Significant Other           Med Note Caryn Section, KYLE A   Wed May 08, 2022  4:50 PM) Hasnt started  famotidine (PEPCID) 20 MG tablet [id number] Yes Take 1 tablet (20 mg total) by mouth daily after supper. [name], MD  Active Spouse/Significant Other  gabapentin (NEURONTIN) 100 MG capsule [id number] Yes TAKE 1 CAPSULE BY MOUTH 4 TIMES DAILY  Patient taking differently: Take 100 mg by mouth 2 (two) times daily.   [name], MD  Active Spouse/Significant Other  Melatonin 12 MG TABS [id number] Yes Take 12 mg by mouth at bedtime as needed (sleep). [provider] 05/26/2022 Active Spouse/Significant Other  METAMUCIL FIBER PO [id number] Yes Take 1 Dose by mouth See admin instructions. Take 1 dose daily, may take a second dose as needed for constipation [provider] 05/26/2022 Active Spouse/Significant  Other  oxyCODONE-acetaminophen (PERCOCET) 10-325 MG tablet [id number]  Take 1 tablet by mouth every 6 (six) hours as needed for pain. [name], MD  Active   PARoxetine (PAXIL) 20 MG tablet [id number] Yes TAKE 1 TABLET BY MOUTH EVERY MORNING [name], MD  Active Spouse/Significant Other  telmisartan-hydrochlorothiazide (MICARDIS HCT) 80-25 MG tablet [id number] Yes TAKE 1 TABLET BY MOUTH EVERY DAY Tanda Rockers, MD 05/26/2022 Active Spouse/Significant Other            Patient Active Problem List   Diagnosis Date Noted   History of total hip arthroplasty, left 05/27/2022   Pre-operative respiratory examination 05/16/2022   S/P mitral valve clip implantation 01/24/2022   Coronary artery disease involving native coronary artery of native heart without angina pectoris 12/27/2021   Severe mitral regurgitation    S/P lumbar laminectomy 11/21/2021   COVID-19 virus infection 10/31/2021   Upper airway cough syndrome 12/04/2020   Pulmonary embolus ([place]) 06/15/2019   History of pulmonary embolism 06/15/2019   Osteoarthritis 10/14/2018   OSA (obstructive sleep apnea) 12/18/2017   Obesity (BMI 30.0-34.9) 06/17/2017   Snoring 06/17/2017   LBBB (left bundle branch block) 10/22/2016   DOE (dyspnea on exertion) 10/22/2016   Ectopic beat, ventricular 10/22/2016   Anemia, iron deficiency 12/03/2014   Pseudogout of elbow 12/02/2014   Hyponatremia 12/02/2014   Constipation 12/02/2014   Suspected Septic arthritis 12/02/2014   Depression 03/04/2008   Hyperlipemia  06/30/2007   Gout 06/30/2007   Essential hypertension 06/30/2007   NECK PAIN 06/30/2007   PROSTATE CANCER, HX OF 06/30/2007    Immunization History  Administered Date(s) Administered   Fluad Quad(high Dose 65+) 08/22/2020, 07/30/2021   Influenza Split 09/05/2011, 11/09/2012   Influenza Whole 10/04/2009, 09/24/2010   Influenza, High Dose Seasonal PF 07/17/2015, 09/01/2017, 07/15/2018, 07/28/2019   Influenza,inj,Quad PF,6+  Mos 11/24/2013, 11/16/2014   Influenza-Unspecified 07/28/2019   PFIZER(Purple Top)SARS-COV-2 Vaccination 11/19/2019, 12/10/2019, 09/26/2020   Pneumococcal Conjugate-13 07/17/2015   Pneumococcal Polysaccharide-23 10/04/2009   Pneumococcal-Unspecified 09/03/2017   Td 03/10/2009   Tdap 07/18/2018   Zoster Recombinat (Shingrix) 06/05/2021, 08/21/2021   Zoster, Live 09/05/2011   Patient is going to PT 2-3 days a week. Patient is using a walker or cane pretty regularly. Patient reports the process is gradual with the healing process. Patient had 4 surgeries this year. Patient gets worn down in not time and reports he is breathing harder lately with exercise.  Patient reports these PT people are keeping him on his feet. Patient reports he hasn't been doing these exercises on the off days.   Patient has lost 10 lbs over the last month or two. He hasn't been eating as much. Patient reports he was eating too much before and thinks this is good. Patient is wanting to get back down to the 200s.   Patient reports he is lazy and often forgets to check his blood pressure at home. Patient has had some dizziness but not very often. He will plan to start checking once a week on Saturdays and keep a record of it.  Conditions to be addressed/monitored:  Hypertension, Hyperlipidemia, Depression, Gout, and Pain, History of PE  Conditions addressed this visit: Hypertension, pain  Care Plan : [place]  Updates made by Viona Gilmore, [place] since 06/18/2022 12:00 AM     Problem: Problem: Hypertension, Hyperlipidemia, Depression, Gout, and Pain, History of PE      Long-Range Goal: Patient-Specific Goal   Start Date: 12/19/2021  Expected End Date: 12/19/2022  Recent Progress: On track  Priority: High  Note:   Current Barriers:  Unable to independently monitor therapeutic efficacy  Pharmacist Clinical Goal(s):  Patient will achieve adherence to monitoring guidelines and medication adherence  to achieve therapeutic efficacy through collaboration with PharmD and provider.   Interventions: 1:1 collaboration with [name], MD regarding development and update of comprehensive plan of care as evidenced by provider attestation and co-signature Inter-disciplinary care team collaboration (see longitudinal plan of care) Comprehensive medication review performed; medication list updated in electronic medical record  Hypertension (BP goal <130/80) -Controlled -Current treatment: Telmisartan-HCTZ 80-25 mg 1 tablet daily - Appropriate, Effective, Safe, Accessible -Medications previously tried: amlodipine (hypotension)  -Current home readings: could not provide readings; has not been checking lately -Current dietary habits: did not discuss -Current exercise habits: did not discuss -Denies hypotensive/hypertensive symptoms -Educated on BP goals and benefits of medications for prevention of heart attack, stroke and kidney damage; Exercise goal of 150 minutes per week; Importance of home blood pressure monitoring; -Counseled to monitor BP at home weekly, document, and provide log at future appointments -Recommended to continue current medication  Hyperlipidemia: (LDL goal < 70) -Not ideally controlled -Current treatment: Atorvastatin 40 mg 1 tablet daily - Appropriate, Effective, Safe, Accessible -Medications previously tried: none  -Current dietary patterns: did not discuss -Current exercise habits: did not discuss -Educated on Cholesterol goals;  Benefits of statin for ASCVD risk reduction; -Counseled on  diet and exercise extensively Recommended to continue current medication  Depression/Anxiety (Goal: minimize symptoms) -Controlled -Current treatment: Paroxetine 20 mg 1 tablet daily - Appropriate, Effective, Safe, Accessible Diazepam 5 mg 1 tablet prior to MRIs - Appropriate, Effective, Safe, Accessible -Medications previously tried/failed: none -PHQ9: 0 -GAD7:  n/a -Educated on Benefits of medication for symptom control Benefits of cognitive-behavioral therapy with or without medication -Recommended to continue current medication  GERD (Goal: minimize symptoms) -Controlled -Current treatment  Famotidine 20 mg 1 tablet daily after supper - Appropriate, Effective, Safe, Accessible -Medications previously tried: none  -Recommended to continue current medication  History of PE (Goal: prevent blood clots) -Controlled -Current treatment  Eliquis 5 mg 1 tablet twice daily - Appropriate, Effective, Safe, Accessible -Medications previously tried: Xarelto (cost/denied by PAP)  -Recommended to continue current medication  Pain (Goal: minimize pain) -Controlled -Current treatment  Cyclobenzaprine 10 mg 1 tablet 3 times daily as needed - Appropriate, Effective, Safe, Accessible Oxycodone-APAP 10-325 mg 1 tablet every 6 hours as needed - Appropriate, Effective, Safe, Accessible -Medications previously tried: none  -Recommended to continue current medication  Gout (Goal: prevent flare ups and uric acid < 6) -Controlled -Current treatment  Allopurinol 300 mg 1 tablet daily - Appropriate, Effective, Safe, Accessible -Medications previously tried: none  -Recommended to continue current medication   Health Maintenance -Vaccine gaps: COVID booster -Current therapy:  None -Educated on Cost vs benefit of each product must be carefully weighed by individual consumer -Patient is satisfied with current therapy and denies issues -Recommended to continue as is.  Patient Goals/Self-Care Activities Patient will:  - take medications as prescribed as evidenced by patient report and record review check blood pressure weekly, document, and provide at future appointments  Follow Up Plan: Telephone follow up appointment with care management team member scheduled for: 6 months       Medication Assistance:  Eliquis obtained through [employer] medication  assistance program.  Enrollment ends 11/03/22  Compliance/Adherence/Medication fill history: Care Gaps: COVID booster, influenza, Hep C screening Last BP - 120/70 on 05/16/2022  Star-Rating Drugs: Atorvastatin 40 mg - last filled 04/17/2022 90 DS at [facility] Telmisartan HCTZ 80/25 mg - last filled 04/17/2022 60 DS at [facility]  Patient's preferred pharmacy is:  [facility] [place], [place] - [address] [place] [address] Phone: [phone] Fax: [place] [address] [place] [address] Phone: [phone] Fax: [phone]  Uses pill box? Yes Pt endorses 99% compliance  We discussed: Current pharmacy is preferred with insurance plan and patient is satisfied with pharmacy services Patient decided to: Continue current medication management strategy  Care Plan and Follow Up Patient Decision:  Patient agrees to Care Plan and Follow-up.  Plan: Telephone follow up appointment with care management team member scheduled for:  6 months  Jeni Salles, PharmD, [place] at [place] [phone]

## 2022-06-19 DIAGNOSIS — M6281 Muscle weakness (generalized): Secondary | ICD-10-CM | POA: Diagnosis not present

## 2022-06-19 DIAGNOSIS — M25652 Stiffness of left hip, not elsewhere classified: Secondary | ICD-10-CM | POA: Diagnosis not present

## 2022-06-19 DIAGNOSIS — Z96642 Presence of left artificial hip joint: Secondary | ICD-10-CM | POA: Diagnosis not present

## 2022-06-21 DIAGNOSIS — M25652 Stiffness of left hip, not elsewhere classified: Secondary | ICD-10-CM | POA: Diagnosis not present

## 2022-06-21 DIAGNOSIS — Z96642 Presence of left artificial hip joint: Secondary | ICD-10-CM | POA: Diagnosis not present

## 2022-06-21 DIAGNOSIS — M6281 Muscle weakness (generalized): Secondary | ICD-10-CM | POA: Diagnosis not present

## 2022-06-24 DIAGNOSIS — M25652 Stiffness of left hip, not elsewhere classified: Secondary | ICD-10-CM | POA: Diagnosis not present

## 2022-06-24 DIAGNOSIS — Z96642 Presence of left artificial hip joint: Secondary | ICD-10-CM | POA: Diagnosis not present

## 2022-06-24 DIAGNOSIS — M6281 Muscle weakness (generalized): Secondary | ICD-10-CM | POA: Diagnosis not present

## 2022-06-25 ENCOUNTER — Encounter (HOSPITAL_BASED_OUTPATIENT_CLINIC_OR_DEPARTMENT_OTHER): Payer: Medicare Other | Admitting: Pulmonary Disease

## 2022-06-26 DIAGNOSIS — M25652 Stiffness of left hip, not elsewhere classified: Secondary | ICD-10-CM | POA: Diagnosis not present

## 2022-06-26 DIAGNOSIS — M6281 Muscle weakness (generalized): Secondary | ICD-10-CM | POA: Diagnosis not present

## 2022-06-26 DIAGNOSIS — Z08 Encounter for follow-up examination after completed treatment for malignant neoplasm: Secondary | ICD-10-CM | POA: Diagnosis not present

## 2022-06-26 DIAGNOSIS — Z96642 Presence of left artificial hip joint: Secondary | ICD-10-CM | POA: Diagnosis not present

## 2022-06-26 DIAGNOSIS — C44329 Squamous cell carcinoma of skin of other parts of face: Secondary | ICD-10-CM | POA: Diagnosis not present

## 2022-06-26 DIAGNOSIS — R229 Localized swelling, mass and lump, unspecified: Secondary | ICD-10-CM | POA: Diagnosis not present

## 2022-06-26 DIAGNOSIS — L570 Actinic keratosis: Secondary | ICD-10-CM | POA: Diagnosis not present

## 2022-06-26 DIAGNOSIS — L821 Other seborrheic keratosis: Secondary | ICD-10-CM | POA: Diagnosis not present

## 2022-06-26 DIAGNOSIS — Z85828 Personal history of other malignant neoplasm of skin: Secondary | ICD-10-CM | POA: Diagnosis not present

## 2022-06-28 DIAGNOSIS — Z96642 Presence of left artificial hip joint: Secondary | ICD-10-CM | POA: Diagnosis not present

## 2022-06-28 DIAGNOSIS — M6281 Muscle weakness (generalized): Secondary | ICD-10-CM | POA: Diagnosis not present

## 2022-06-28 DIAGNOSIS — M25652 Stiffness of left hip, not elsewhere classified: Secondary | ICD-10-CM | POA: Diagnosis not present

## 2022-07-01 DIAGNOSIS — M25652 Stiffness of left hip, not elsewhere classified: Secondary | ICD-10-CM | POA: Diagnosis not present

## 2022-07-01 DIAGNOSIS — M6281 Muscle weakness (generalized): Secondary | ICD-10-CM | POA: Diagnosis not present

## 2022-07-01 DIAGNOSIS — Z96642 Presence of left artificial hip joint: Secondary | ICD-10-CM | POA: Diagnosis not present

## 2022-07-03 DIAGNOSIS — M6281 Muscle weakness (generalized): Secondary | ICD-10-CM | POA: Diagnosis not present

## 2022-07-03 DIAGNOSIS — Z96642 Presence of left artificial hip joint: Secondary | ICD-10-CM | POA: Diagnosis not present

## 2022-07-03 DIAGNOSIS — M25652 Stiffness of left hip, not elsewhere classified: Secondary | ICD-10-CM | POA: Diagnosis not present

## 2022-07-04 DIAGNOSIS — F32A Depression, unspecified: Secondary | ICD-10-CM | POA: Diagnosis not present

## 2022-07-04 DIAGNOSIS — E785 Hyperlipidemia, unspecified: Secondary | ICD-10-CM

## 2022-07-05 DIAGNOSIS — Z96642 Presence of left artificial hip joint: Secondary | ICD-10-CM | POA: Diagnosis not present

## 2022-07-05 DIAGNOSIS — M6281 Muscle weakness (generalized): Secondary | ICD-10-CM | POA: Diagnosis not present

## 2022-07-05 DIAGNOSIS — M25652 Stiffness of left hip, not elsewhere classified: Secondary | ICD-10-CM | POA: Diagnosis not present

## 2022-07-10 ENCOUNTER — Other Ambulatory Visit: Payer: Self-pay | Admitting: Internal Medicine

## 2022-07-10 DIAGNOSIS — Z96642 Presence of left artificial hip joint: Secondary | ICD-10-CM | POA: Diagnosis not present

## 2022-07-10 DIAGNOSIS — M6281 Muscle weakness (generalized): Secondary | ICD-10-CM | POA: Diagnosis not present

## 2022-07-10 DIAGNOSIS — M25651 Stiffness of right hip, not elsewhere classified: Secondary | ICD-10-CM | POA: Diagnosis not present

## 2022-07-12 DIAGNOSIS — Z96642 Presence of left artificial hip joint: Secondary | ICD-10-CM | POA: Diagnosis not present

## 2022-07-12 DIAGNOSIS — M25652 Stiffness of left hip, not elsewhere classified: Secondary | ICD-10-CM | POA: Diagnosis not present

## 2022-07-12 DIAGNOSIS — M6281 Muscle weakness (generalized): Secondary | ICD-10-CM | POA: Diagnosis not present

## 2022-07-15 DIAGNOSIS — M6281 Muscle weakness (generalized): Secondary | ICD-10-CM | POA: Diagnosis not present

## 2022-07-15 DIAGNOSIS — M25652 Stiffness of left hip, not elsewhere classified: Secondary | ICD-10-CM | POA: Diagnosis not present

## 2022-07-15 DIAGNOSIS — Z96642 Presence of left artificial hip joint: Secondary | ICD-10-CM | POA: Diagnosis not present

## 2022-07-17 ENCOUNTER — Other Ambulatory Visit: Payer: Self-pay | Admitting: Internal Medicine

## 2022-07-17 ENCOUNTER — Other Ambulatory Visit: Payer: Self-pay | Admitting: Family Medicine

## 2022-07-18 DIAGNOSIS — M25652 Stiffness of left hip, not elsewhere classified: Secondary | ICD-10-CM | POA: Diagnosis not present

## 2022-07-18 DIAGNOSIS — Z96642 Presence of left artificial hip joint: Secondary | ICD-10-CM | POA: Diagnosis not present

## 2022-07-18 DIAGNOSIS — M6281 Muscle weakness (generalized): Secondary | ICD-10-CM | POA: Diagnosis not present

## 2022-07-24 DIAGNOSIS — Z96642 Presence of left artificial hip joint: Secondary | ICD-10-CM | POA: Diagnosis not present

## 2022-07-24 DIAGNOSIS — M25652 Stiffness of left hip, not elsewhere classified: Secondary | ICD-10-CM | POA: Diagnosis not present

## 2022-07-24 DIAGNOSIS — M6281 Muscle weakness (generalized): Secondary | ICD-10-CM | POA: Diagnosis not present

## 2022-07-30 ENCOUNTER — Telehealth (HOSPITAL_COMMUNITY): Payer: Self-pay | Admitting: Family Medicine

## 2022-07-30 DIAGNOSIS — Z96642 Presence of left artificial hip joint: Secondary | ICD-10-CM | POA: Diagnosis not present

## 2022-07-30 DIAGNOSIS — M6281 Muscle weakness (generalized): Secondary | ICD-10-CM | POA: Diagnosis not present

## 2022-07-30 DIAGNOSIS — M25652 Stiffness of left hip, not elsewhere classified: Secondary | ICD-10-CM | POA: Diagnosis not present

## 2022-08-06 ENCOUNTER — Telehealth: Payer: Self-pay | Admitting: Pharmacist

## 2022-08-06 NOTE — Chronic Care Management (AMB) (Signed)
    Chronic Care Management Pharmacy Assistant   Name: Ricky Meyers  MRN: [id number] DOB: [birth year]  Reason for Encounter: Follow up home blood pressure readings.    Recent Office Vitals: BP Readings from Last 3 Encounters:  05/27/22 120/76  05/16/22 120/70  05/15/22 111/75   Current antihypertensive regimen:  Micardis HCT 80/25 mg daily  How often are you checking your Blood Pressure? Patient was planning to check blood pressures weekly.   Current home BP readings:   Unable to reach patient after several attempts.  [place] Pharmacist Assistant [phone]

## 2022-08-08 ENCOUNTER — Other Ambulatory Visit: Payer: Self-pay | Admitting: Family Medicine

## 2022-08-08 ENCOUNTER — Telehealth (INDEPENDENT_AMBULATORY_CARE_PROVIDER_SITE_OTHER): Payer: Medicare Other | Admitting: Family Medicine

## 2022-08-08 ENCOUNTER — Encounter: Payer: Self-pay | Admitting: Family Medicine

## 2022-08-08 ENCOUNTER — Telehealth (HOSPITAL_COMMUNITY): Payer: Self-pay | Admitting: Family Medicine

## 2022-08-08 DIAGNOSIS — I2510 Atherosclerotic heart disease of native coronary artery without angina pectoris: Secondary | ICD-10-CM | POA: Diagnosis not present

## 2022-08-08 DIAGNOSIS — M159 Polyosteoarthritis, unspecified: Secondary | ICD-10-CM | POA: Diagnosis not present

## 2022-08-08 DIAGNOSIS — M25552 Pain in left hip: Secondary | ICD-10-CM | POA: Diagnosis not present

## 2022-08-08 DIAGNOSIS — G8929 Other chronic pain: Secondary | ICD-10-CM | POA: Diagnosis not present

## 2022-08-08 DIAGNOSIS — F1190 Opioid use, unspecified, uncomplicated: Secondary | ICD-10-CM | POA: Diagnosis not present

## 2022-08-08 MED ORDER — OXYCODONE-ACETAMINOPHEN 10-325 MG PO TABS: 1.0000 | ORAL_TABLET | Freq: Four times a day (QID) | ORAL | 0 refills | Status: AC | PRN

## 2022-08-08 MED ORDER — OXYCODONE-ACETAMINOPHEN 10-325 MG PO TABS: 1.0000 | ORAL_TABLET | Freq: Four times a day (QID) | ORAL | 0 refills | Status: DC | PRN

## 2022-08-08 NOTE — Progress Notes (Signed)
Subjective:    Patient ID: Ricky Meyers, male    DOB: 1945-11-14, 76 y.o.   MRN: [id number]  HPI Virtual Visit via Video Note  I connected with the patient on 08/08/22 at  9:00 AM EDT by a video enabled telemedicine application and verified that I am speaking with the correct person using two identifiers.  Location patient: home Location provider:work or home office Persons participating in the virtual visit: patient, provider  I discussed the limitations of evaluation and management by telemedicine and the availability of in person appointments. The patient expressed understanding and agreed to proceed.   HPI: Here for pain management. He has recovered well from his hip surgery, and he has completed the rehab therapy. His pain levels are well managed. He now plans to start cardiac rehab as soon as he can.   ROS: See pertinent positives and negatives per HPI.  Past Medical History:  Diagnosis Date   Arthritis    neck and back    Chronic neck pain    Depression    ED (erectile dysfunction)    GERD (gastroesophageal reflux disease)    dysphagia   Gout    sees Dr. Leigh Aurora    Hyperlipidemia    Hypertension    OSA (obstructive sleep apnea) 12/18/2017   Severe with AHI at 70.1/hr and is now on CPAP at 11cm H2o   Prostate cancer [facility])    history of   Pulmonary embolism ([place]) 06/2019   S/P mitral valve clip implantation 01/24/2022   s/p Mitraclip XTW + NTW with Dr. Burt Knack and Dr. Ali Lowe   Sleep apnea    snoring/never checked    Past Surgical History:  Procedure Laterality Date   AMPUTATION Left 07/18/2018   Procedure: LEFT RING FINGER REVISION AMPUTATION;  Surgeon: Leanora Cover, MD;  Location: [place];  Service: Orthopedics;  Laterality: Left;   BACK SURGERY     CERVICAL FUSION  12/02/2007   per Dr. Sherley Bounds   COLONOSCOPY  03/11/2017   per Dr. Ardis Hughs, clear, no repeats needed    injection lower back Right 09/17/2016   IR IVC FILTER PLMT / S&I /IMG GUID/MOD  SED  11/15/2021   IR IVC FILTER RETRIEVAL / S&I /IMG GUID/MOD SED  12/14/2021   IR RADIOLOGIST EVAL & MGMT  10/30/2021   LUMBAR LAMINECTOMY/DECOMPRESSION MICRODISCECTOMY Left 11/21/2021   Procedure: Laminectomy and Foraminotomy - left - Lumbar three-four;  Surgeon: [name], MD;  Location: [place];  Service: Neurosurgery;  Laterality: Left;   MITRAL VALVE REPAIR N/A 01/24/2022   Procedure: MITRAL VALVE REPAIR;  Surgeon: Meyers Osmond, MD;  Location: [place] [facility];  Service: Cardiovascular;  Laterality: N/A;   PROSTATECTOMY  2000   RIGHT/LEFT HEART CATH AND CORONARY ANGIOGRAPHY N/A 12/20/2021   Procedure: RIGHT/LEFT HEART CATH AND CORONARY ANGIOGRAPHY;  Surgeon: [name], MD;  Location: [place] [facility];  Service: Cardiovascular;  Laterality: N/A;   ROTATOR CUFF REPAIR Right 11/2017   TEE WITHOUT CARDIOVERSION N/A 01/01/2022   Procedure: TRANSESOPHAGEAL ECHOCARDIOGRAM (TEE);  Surgeon: [name], MD;  Location: [place];  Service: Cardiovascular;  Laterality: N/A;   TEE WITHOUT CARDIOVERSION N/A 01/24/2022   Procedure: TRANSESOPHAGEAL ECHOCARDIOGRAM (TEE);  Surgeon: Meyers Osmond, MD;  Location: [place] [facility];  Service: Cardiovascular;  Laterality: N/A;   TONSILLECTOMY     as a child   TOTAL HIP ARTHROPLASTY Left 05/27/2022   Procedure: TOTAL HIP ARTHROPLASTY ANTERIOR APPROACH;  Surgeon: Dorna Leitz,  MD;  [facility];  Service: Orthopedics;  Laterality: Left;   VASECTOMY      Family History  Problem Relation Age of Onset   Cancer Other        breast, porstate   Hypertension Other    Stroke Other    Breast cancer Mother    Diverticulitis Mother    Heart disease Father    Prostate cancer Father    Stroke Father    Dementia Sister    Colon cancer Neg Hx      Current Outpatient Medications:    allopurinol (ZYLOPRIM) 300 MG tablet, TAKE 1 TABLET BY MOUTH EVERY DAY, Disp: 90 tablet, Rfl: 0   amoxicillin (AMOXIL) 500 MG capsule, Take 4  capsules (2,000 mg total) by mouth as directed. Take (4) capsules 1 hour before dental work, Disp: 4 capsule, Rfl: 3   apixaban (ELIQUIS) 2.5 MG TABS tablet, Take 1 tablet (2.5 mg total) by mouth 2 (two) times daily., Disp: 30 tablet, Rfl: 0   atorvastatin (LIPITOR) 40 MG tablet, TAKE 1 TABLET BY MOUTH EVERY DAY, Disp: 90 tablet, Rfl: 0   cyclobenzaprine (FLEXERIL) 10 MG tablet, Take 1 tablet (10 mg total) by mouth 3 (three) times daily as needed for muscle spasms., Disp: 90 tablet, Rfl: 5   famotidine (PEPCID) 20 MG tablet, Take 1 tablet (20 mg total) by mouth daily after supper., Disp: 90 tablet, Rfl: 3   gabapentin (NEURONTIN) 100 MG capsule, TAKE 1 CAPSULE BY MOUTH 4 TIMES DAILY (Patient taking differently: Take 100 mg by mouth 2 (two) times daily.), Disp: 120 capsule, Rfl: 2   Melatonin 12 MG TABS, Take 12 mg by mouth at bedtime as needed (sleep)., Disp: , Rfl:    METAMUCIL FIBER PO, Take 1 Dose by mouth See admin instructions. Take 1 dose daily, may take a second dose as needed for constipation, Disp: , Rfl:    PARoxetine (PAXIL) 20 MG tablet, TAKE 1 TABLET BY MOUTH EVERY MORNING, Disp: 90 tablet, Rfl: 0   telmisartan-hydrochlorothiazide (MICARDIS HCT) 80-25 MG tablet, TAKE 1 TABLET BY MOUTH EVERY DAY, Disp: 30 tablet, Rfl: 0   [START ON 10/08/2022] oxyCODONE-acetaminophen (PERCOCET) 10-325 MG tablet, Take 1 tablet by mouth every 6 (six) hours as needed for pain., Disp: 120 tablet, Rfl: 0  EXAM:  VITALS per patient if applicable:  GENERAL: alert, oriented, appears well and in no acute distress  HEENT: atraumatic, conjunttiva clear, no obvious abnormalities on inspection of external nose and ears  NECK: normal movements of the head and neck  LUNGS: on inspection no signs of respiratory distress, breathing rate appears normal, no obvious gross SOB, gasping or wheezing  CV: no obvious cyanosis  MS: moves all visible extremities without noticeable abnormality  PSYCH/NEURO: pleasant and  cooperative, no obvious depression or anxiety, speech and thought processing grossly intact  ASSESSMENT AND PLAN: Pain management. Indication for chronic opioid: OA Medication and dose: Percocet 10-325 # pills per month: 120 Last UDS date: 11-06-21 Opioid Treatment Agreement signed (Y/N): 01-15-19 Opioid Treatment Agreement last reviewed with patient:  08-08-22 [place] reviewed this encounter (include red flags): Yes Meds were refilled.  Alysia Penna, MD  Discussed the following assessment and plan:  No diagnosis found.     I discussed the assessment and treatment plan with the patient. The patient was provided an opportunity to ask questions and all were answered. The patient agreed with the plan and demonstrated an understanding of the instructions.   The patient was advised to  call back or seek an in-person evaluation if the symptoms worsen or if the condition fails to improve as anticipated.      Review of Systems     Objective:   Physical Exam        Assessment & Plan:

## 2022-08-13 ENCOUNTER — Telehealth (HOSPITAL_COMMUNITY): Payer: Self-pay | Admitting: *Deleted

## 2022-08-13 NOTE — Telephone Encounter (Signed)
Returned pt call from message left earlier.  Pt inquiring getting scheduled for Cardiac Rehab.  Pt had total hip replacement on 7/24.  Per pt he is doing very well.  No longer under the care of PT or using assistive device.  Vladislav is driving and is eager to get started.  Has upcoming appt with Dr. Irish Lack on 10/17.  Would like for pt to complete this appt with cardiology since his cardiac event was in march.  Pt verbalized understanding. Cherre Huger, BSN Cardiac and Training and development officer

## 2022-08-19 NOTE — Progress Notes (Unsigned)
Cardiology Office Note   Date:  08/20/2022   ID:  Ricky Meyers, Ricky Meyers [birth year], MRN [id number]  PCP:  [name], MD    No chief complaint on file.  CAD  Wt Readings from Last 3 Encounters:  08/20/22 242 lb (109.8 kg)  05/27/22 242 lb 9.6 oz (110 kg)  05/16/22 242 lb 9.6 oz (110 kg)       History of Present Illness: Ricky Meyers is a 76 y.o. male     Who has had PVCs in the past.   He had some DOE  And was referred for nuclear stress test and echocardiogram.  No ischemia noted but he did have slightly decreased EF.     2018 echo showed: Technically difficult study. Definity contrast given. LVEF 45-50%   with anterior and anteroseptal hypokinesis to dyskinesis, mild   LVH, grade 1 DD, normal LV filling pressure, trivial MR, normal   LA size, normal IVC.   He has had issues with lack of exercise in the past.    He was seen by Dr. Radford Pax for OSA. Had DOE in the past thought to be due to deconditioning.   He was diagnosed with DVT/PE in 06/2019.  He has been on XArelto.  Breathing has been better.  MVP: eccentric MR noted by echo in 06/2019.     06/2019 echo: "The left ventricle has normal systolic function, with an ejection fraction of 55-60%. The cavity size was normal. There is mildly increased left ventricular wall thickness. Left ventricular diastolic Doppler parameters are consistent with impaired  relaxation.  2. The right ventricle has moderately reduced systolic function. The cavity was mildly enlarged.  3. The aortic valve has an indeterminate number of cusps. No stenosis of the aortic valve.  4. The aorta is normal in size and structure.  5. The inferior vena cava was dilated in size with >50% respiratory variability.  6. There is left bowing of the interatrial septum, suggestive of elevated right atrial pressure.  7. Technically difficult; normal LV systolic function; mild diastolic dysfunction; mild LVH; mitral valve not well visualize but there is  eccentric MR directed anteriorly suggesting prolapse of posterior MV leaflet; mild RVE; moderate RV dysfunction."   2021 echo: "Left ventricular ejection fraction, by estimation, is 55 to 60%. The  left ventricle has normal function. The left ventricle has no regional  wall motion abnormalities. Left ventricular diastolic parameters are  consistent with Grade I diastolic  dysfunction (impaired relaxation).   2. Right ventricular systolic function is normal. The right ventricular  size is normal.   3. There is severe prolapse of the P2 segment of the PMVL. There is at  least moderate MR, that is eccentric and anteriorly directed. The LA is  normal size, the LV is normal size, and the MV inflow is A dominant.  Suspect this is moderate MR. If there are   clinical concerns for more severe regurgitation, would recommend TEE. The  MR does appear similar to 2018. The mitral valve is myxomatous. Moderate  mitral valve regurgitation. No evidence of mitral stenosis.   4. The aortic valve is tricuspid. Aortic valve regurgitation is not  visualized. No aortic stenosis is present.   5. The inferior vena cava is normal in size with greater than 50%  respiratory variability, suggesting right atrial pressure of 3 mmHg. "     Back surgery in 11/2021.   Mild nonobstructive CAD by cath in 2023.   Had Mitraclip  in early 2023.  Had total hip replacement in 7/23.   He has done well since that time.  Still not walking much but tries to stay active.  Denies : Chest pain. Dizziness. Leg edema. Nitroglycerin use. Orthopnea. Palpitations. Paroxysmal nocturnal dyspnea. Shortness of breath. Syncope.        Past Medical History:  Diagnosis Date   Arthritis    neck and back    Chronic neck pain    Depression    ED (erectile dysfunction)    GERD (gastroesophageal reflux disease)    dysphagia   Gout    sees Dr. Leigh Aurora    Hyperlipidemia    Hypertension    OSA (obstructive sleep apnea)  12/18/2017   Severe with AHI at 70.1/hr and is now on CPAP at 11cm H2o   Prostate cancer [facility])    history of   Pulmonary embolism ([place]) 06/2019   S/P mitral valve clip implantation 01/24/2022   s/p Mitraclip XTW + NTW with Dr. Burt Knack and Dr. Ali Lowe   Sleep apnea    snoring/never checked    Past Surgical History:  Procedure Laterality Date   AMPUTATION Left 07/18/2018   Procedure: LEFT RING FINGER REVISION AMPUTATION;  Surgeon: Leanora Cover, MD;  Location: [place];  Service: Orthopedics;  Laterality: Left;   BACK SURGERY     CERVICAL FUSION  12/02/2007   per Dr. Sherley Bounds   COLONOSCOPY  03/11/2017   per Dr. Ardis Hughs, clear, no repeats needed    injection lower back Right 09/17/2016   IR IVC FILTER PLMT / S&I /IMG GUID/MOD SED  11/15/2021   IR IVC FILTER RETRIEVAL / S&I /IMG GUID/MOD SED  12/14/2021   IR RADIOLOGIST EVAL & MGMT  10/30/2021   LUMBAR LAMINECTOMY/DECOMPRESSION MICRODISCECTOMY Left 11/21/2021   Procedure: Laminectomy and Foraminotomy - left - Lumbar three-four;  Surgeon: [name], MD;  Location: [place];  Service: Neurosurgery;  Laterality: Left;   MITRAL VALVE REPAIR N/A 01/24/2022   Procedure: MITRAL VALVE REPAIR;  Surgeon: [name], MD;  Location: [place] [facility];  Service: Cardiovascular;  Laterality: N/A;   PROSTATECTOMY  2000   RIGHT/LEFT HEART CATH AND CORONARY ANGIOGRAPHY N/A 12/20/2021   Procedure: RIGHT/LEFT HEART CATH AND CORONARY ANGIOGRAPHY;  Surgeon: [name], MD;  Location: [place] [facility];  Service: Cardiovascular;  Laterality: N/A;   ROTATOR CUFF REPAIR Right 11/2017   TEE WITHOUT CARDIOVERSION N/A 01/01/2022   Procedure: TRANSESOPHAGEAL ECHOCARDIOGRAM (TEE);  Surgeon: [name], MD;  Location: [place];  Service: Cardiovascular;  Laterality: N/A;   TEE WITHOUT CARDIOVERSION N/A 01/24/2022   Procedure: TRANSESOPHAGEAL ECHOCARDIOGRAM (TEE);  Surgeon: [name], MD;  Location: [place] [facility];  Service:  Cardiovascular;  Laterality: N/A;   TONSILLECTOMY     as a child   TOTAL HIP ARTHROPLASTY Left 05/27/2022   Procedure: TOTAL HIP ARTHROPLASTY ANTERIOR APPROACH;  Surgeon: Dorna Leitz, MD;  [facility];  Service: Orthopedics;  Laterality: Left;   VASECTOMY       Current Outpatient Medications  Medication Sig Dispense Refill   allopurinol (ZYLOPRIM) 300 MG tablet TAKE 1 TABLET BY MOUTH EVERY DAY 90 tablet 0   amoxicillin (AMOXIL) 500 MG capsule Take 4 capsules (2,000 mg total) by mouth as directed. Take (4) capsules 1 hour before dental work 4 capsule 3   apixaban (ELIQUIS) 2.5 MG TABS tablet Take 1 tablet (2.5 mg total) by mouth 2 (two) times daily. 30 tablet 0   atorvastatin (  LIPITOR) 40 MG tablet TAKE 1 TABLET BY MOUTH EVERY DAY 90 tablet 0   cyclobenzaprine (FLEXERIL) 10 MG tablet Take 1 tablet (10 mg total) by mouth 3 (three) times daily as needed for muscle spasms. 90 tablet 5   famotidine (PEPCID) 20 MG tablet Take 1 tablet (20 mg total) by mouth daily after supper. 90 tablet 3   gabapentin (NEURONTIN) 100 MG capsule TAKE 1 CAPSULE BY MOUTH 4 TIMES DAILY (Patient taking differently: Take 100 mg by mouth 2 (two) times daily.) 120 capsule 2   Melatonin 12 MG TABS Take 12 mg by mouth at bedtime as needed (sleep).     METAMUCIL FIBER PO Take 1 Dose by mouth See admin instructions. Take 1 dose daily, may take a second dose as needed for constipation     [START ON 10/08/2022] oxyCODONE-acetaminophen (PERCOCET) 10-325 MG tablet Take 1 tablet by mouth every 6 (six) hours as needed for pain. 120 tablet 0   PARoxetine (PAXIL) 20 MG tablet TAKE 1 TABLET BY MOUTH EVERY MORNING 90 tablet 0   telmisartan-hydrochlorothiazide (MICARDIS HCT) 80-25 MG tablet TAKE 1 TABLET BY MOUTH EVERY DAY 30 tablet 0   No current facility-administered medications for this visit.    Allergies:   Patient has no known allergies.    Social History:  The patient  reports that he has never smoked. He has never used  smokeless tobacco. He reports current alcohol use. He reports that he does not currently use drugs.   Family History:  [name] family history includes Breast cancer in his mother; Cancer in an other family member; Dementia in his sister; Diverticulitis in his mother; Heart disease in his father; Hypertension in an other family member; Prostate cancer in his father; Stroke in his father and another family member.    ROS:  Please see the history of present illness.   Otherwise, review of systems are positive for hip pain- improved.   All other systems are reviewed and negative.    PHYSICAL EXAM: VS:  BP (!) 138/90   Pulse 83   Ht 6' (1.829 m)   Wt 242 lb (109.8 kg)   SpO2 96%   BMI 32.82 kg/m  , BMI Body mass index is 32.82 kg/m. GEN: Well nourished, well developed, in no acute distress HEENT: normal Neck: no JVD, carotid bruits, or masses Cardiac: RRR; no murmurs, rubs, or gallops,no edema  Respiratory:  clear to auscultation bilaterally, normal work of breathing GI: soft, nontender, nondistended, + BS, obese MS: no deformity or atrophy Skin: warm and dry, no rash Neuro:  Strength and sensation are intact Psych: euthymic mood, full affect     Recent Labs: 08/23/2021: TSH 1.32 01/22/2022: ALT 24; B Natriuretic Peptide 133.8 05/15/2022: BUN 19; Creatinine, Ser 1.21; Hemoglobin 13.8; Platelets 166; Potassium 4.5; Sodium 138   Lipid Panel    Component Value Date/Time   CHOL 153 08/23/2021 0940   TRIG 84.0 08/23/2021 0940   HDL 65.20 08/23/2021 0940   CHOLHDL 2 08/23/2021 0940   VLDL 16.8 08/23/2021 0940   LDLCALC 71 08/23/2021 0940   LDLCALC 88 08/22/2020 1208   LDLDIRECT 161.4 09/29/2012 0811     Other studies Reviewed: Additional studies/ records that were reviewed today with results demonstrating: echo results reviewed.   ASSESSMENT AND PLAN:  S/p Mitraclip:  No CHF. Echo in 4/24.  Needs SBE prophylaxis. Mild MR on 4/23 echo.  Hypertension: Home readings are  in the 120s/70s typically. The current medical regimen is effective;  continue present plan and medications. Hyperlipidemia:  LDL 71 in 08/2021.  Continue atorvastatin. LFTs normal in March.  Try to eat healthy diet.  He admits to dietary indiscretion.  Did not have obstructive CAD on his catheterization before TEER.    Current medicines are reviewed at length with the patient today.  The patient concerns regarding his medicines were addressed.  The following changes have been made:  No change  Labs/ tests ordered today include:  No orders of the defined types were placed in this encounter.   Recommend 150 minutes/week of aerobic exercise Low fat, low carb, high fiber diet recommended  Disposition:   FU in 1 year   Signed, Larae Grooms, MD  08/20/2022 11:46 AM    [facility] [address], [place], [place]  [address] Phone: [phone]; Fax: [phone]

## 2022-08-20 ENCOUNTER — Encounter: Payer: Self-pay | Admitting: Interventional Cardiology

## 2022-08-20 ENCOUNTER — Ambulatory Visit: Payer: Medicare Other | Attending: Interventional Cardiology | Admitting: Interventional Cardiology

## 2022-08-20 VITALS — BP 138/90 | HR 83 | Ht 72.0 in | Wt 242.0 lb

## 2022-08-20 DIAGNOSIS — I2510 Atherosclerotic heart disease of native coronary artery without angina pectoris: Secondary | ICD-10-CM

## 2022-08-20 DIAGNOSIS — Z9889 Other specified postprocedural states: Secondary | ICD-10-CM | POA: Diagnosis not present

## 2022-08-20 DIAGNOSIS — E782 Mixed hyperlipidemia: Secondary | ICD-10-CM | POA: Insufficient documentation

## 2022-08-20 DIAGNOSIS — I10 Essential (primary) hypertension: Secondary | ICD-10-CM | POA: Diagnosis not present

## 2022-08-20 DIAGNOSIS — Z95818 Presence of other cardiac implants and grafts: Secondary | ICD-10-CM | POA: Diagnosis not present

## 2022-08-20 NOTE — Patient Instructions (Signed)
Medication Instructions:  Your physician recommends that you continue on your current medications as directed. Please refer to the Current Medication list given to you today.  *If you need a refill on your cardiac medications before your next appointment, please call your pharmacy*   Lab Work: none If you have labs (blood work) drawn today and your tests are completely normal, you will receive your results only by: [place] (if you have MyChart) OR A paper copy in the mail If you have any lab test that is abnormal or we need to change your treatment, we will call you to review the results.   Testing/Procedures: Your physician has requested that you have an echocardiogram. Echocardiography is a painless test that uses sound waves to create images of your heart. It provides your doctor with information about the size and shape of your heart and how well your heart's chambers and valves are working. This procedure takes approximately one hour. There are no restrictions for this procedure. Please do NOT wear cologne, perfume, aftershave, or lotions (deodorant is allowed). Please arrive 15 minutes prior to your appointment time. To be done on January 27, 2023 as scheduled   Follow-Up: At [facility], you and your health needs are our priority.  As part of our continuing mission to provide you with exceptional heart care, we have created designated Provider Care Teams.  These Care Teams include your primary Cardiologist (physician) and Advanced Practice Providers (APPs -  Physician Assistants and Nurse Practitioners) who all work together to provide you with the care you need, when you need it.  We recommend signing up for the patient portal called "MyChart".  Sign up information is provided on this After Visit Summary.  MyChart is used to connect with patients for Virtual Visits (Telemedicine).  Patients are able to view lab/test results, encounter notes, upcoming appointments, etc.   Non-urgent messages can be sent to your provider as well.   To learn more about what you can do with MyChart, go to [email or web address].    Your next appointment:   12 month(s)  The format for your next appointment:   In Person  Provider:   Larae Grooms, MD     Other Instructions    Important Information About Sugar

## 2022-08-22 ENCOUNTER — Encounter (HOSPITAL_COMMUNITY): Payer: Self-pay

## 2022-08-23 ENCOUNTER — Other Ambulatory Visit: Payer: Self-pay

## 2022-08-23 ENCOUNTER — Encounter: Payer: Self-pay | Admitting: Family Medicine

## 2022-08-23 ENCOUNTER — Other Ambulatory Visit: Payer: Self-pay | Admitting: Family Medicine

## 2022-08-23 DIAGNOSIS — I10 Essential (primary) hypertension: Secondary | ICD-10-CM

## 2022-08-23 MED ORDER — TELMISARTAN-HCTZ 80-25 MG PO TABS: 1.0000 | ORAL_TABLET | Freq: Every day | ORAL | 1 refills | Status: DC

## 2022-08-23 NOTE — Telephone Encounter (Signed)
Yes please call in a 30 day refill

## 2022-08-26 ENCOUNTER — Ambulatory Visit (INDEPENDENT_AMBULATORY_CARE_PROVIDER_SITE_OTHER): Payer: Medicare Other | Admitting: Family Medicine

## 2022-08-26 ENCOUNTER — Other Ambulatory Visit: Payer: Self-pay | Admitting: Family Medicine

## 2022-08-26 ENCOUNTER — Encounter: Payer: Self-pay | Admitting: Family Medicine

## 2022-08-26 ENCOUNTER — Telehealth (HOSPITAL_COMMUNITY): Payer: Self-pay

## 2022-08-26 VITALS — BP 140/98 | HR 74 | Temp 97.7°F | Ht 72.0 in | Wt 250.0 lb

## 2022-08-26 DIAGNOSIS — Z8546 Personal history of malignant neoplasm of prostate: Secondary | ICD-10-CM | POA: Diagnosis not present

## 2022-08-26 DIAGNOSIS — G4733 Obstructive sleep apnea (adult) (pediatric): Secondary | ICD-10-CM

## 2022-08-26 DIAGNOSIS — Z86711 Personal history of pulmonary embolism: Secondary | ICD-10-CM | POA: Diagnosis not present

## 2022-08-26 DIAGNOSIS — M109 Gout, unspecified: Secondary | ICD-10-CM | POA: Diagnosis not present

## 2022-08-26 DIAGNOSIS — D509 Iron deficiency anemia, unspecified: Secondary | ICD-10-CM | POA: Diagnosis not present

## 2022-08-26 DIAGNOSIS — R739 Hyperglycemia, unspecified: Secondary | ICD-10-CM | POA: Diagnosis not present

## 2022-08-26 DIAGNOSIS — K5900 Constipation, unspecified: Secondary | ICD-10-CM | POA: Diagnosis not present

## 2022-08-26 DIAGNOSIS — E782 Mixed hyperlipidemia: Secondary | ICD-10-CM | POA: Diagnosis not present

## 2022-08-26 DIAGNOSIS — Z23 Encounter for immunization: Secondary | ICD-10-CM | POA: Diagnosis not present

## 2022-08-26 DIAGNOSIS — I2510 Atherosclerotic heart disease of native coronary artery without angina pectoris: Secondary | ICD-10-CM

## 2022-08-26 DIAGNOSIS — I10 Essential (primary) hypertension: Secondary | ICD-10-CM | POA: Diagnosis not present

## 2022-08-26 DIAGNOSIS — M159 Polyosteoarthritis, unspecified: Secondary | ICD-10-CM | POA: Diagnosis not present

## 2022-08-26 DIAGNOSIS — F32A Depression, unspecified: Secondary | ICD-10-CM | POA: Diagnosis not present

## 2022-08-26 DIAGNOSIS — I340 Nonrheumatic mitral (valve) insufficiency: Secondary | ICD-10-CM

## 2022-08-26 LAB — CBC WITH DIFFERENTIAL/PLATELET
Basophils Absolute: 0 10*3/uL (ref 0.0–0.1)
Basophils Relative: 0.3 % (ref 0.0–3.0)
Eosinophils Absolute: 0.1 10*3/uL (ref 0.0–0.7)
Eosinophils Relative: 2.3 % (ref 0.0–5.0)
HCT: 40 % (ref 39.0–52.0)
Hemoglobin: 13.1 g/dL (ref 13.0–17.0)
Lymphocytes Relative: 23.9 % (ref 12.0–46.0)
Lymphs Abs: 1.5 10*3/uL (ref 0.7–4.0)
MCHC: 32.7 g/dL (ref 30.0–36.0)
MCV: 95.3 fl (ref 78.0–100.0)
Monocytes Absolute: 0.8 10*3/uL (ref 0.1–1.0)
Monocytes Relative: 12.2 % — ABNORMAL HIGH (ref 3.0–12.0)
Neutro Abs: 4 10*3/uL (ref 1.4–7.7)
Neutrophils Relative %: 61.3 % (ref 43.0–77.0)
Platelets: 164 10*3/uL (ref 150.0–400.0)
RBC: 4.2 Mil/uL — ABNORMAL LOW (ref 4.22–5.81)
RDW: 16.1 % — ABNORMAL HIGH (ref 11.5–15.5)
WBC: 6.5 10*3/uL (ref 4.0–10.5)

## 2022-08-26 LAB — LIPID PANEL
Cholesterol: 160 mg/dL (ref 0–200)
HDL: 66 mg/dL (ref >39.00)
LDL Cholesterol: 71 mg/dL (ref 0–99)
NonHDL: 94.33
Total CHOL/HDL Ratio: 2
Triglycerides: 118 mg/dL (ref 0.0–149.0)
VLDL: 23.6 mg/dL (ref 0.0–40.0)

## 2022-08-26 LAB — HEPATIC FUNCTION PANEL
ALT: 24 U/L (ref 0–53)
AST: 26 U/L (ref 0–37)
Albumin: 4.2 g/dL (ref 3.5–5.2)
Alkaline Phosphatase: 80 U/L (ref 39–117)
Bilirubin, Direct: 0.2 mg/dL (ref 0.0–0.3)
Total Bilirubin: 0.9 mg/dL (ref 0.2–1.2)
Total Protein: 6.9 g/dL (ref 6.0–8.3)

## 2022-08-26 LAB — URIC ACID: Uric Acid, Serum: 4.2 mg/dL (ref 4.0–7.8)

## 2022-08-26 LAB — BASIC METABOLIC PANEL
BUN: 15 mg/dL (ref 6–23)
CO2: 28 mEq/L (ref 19–32)
Calcium: 9.5 mg/dL (ref 8.4–10.5)
Chloride: 104 mEq/L (ref 96–112)
Creatinine, Ser: 1 mg/dL (ref 0.40–1.50)
GFR: 72.05 mL/min (ref >60.00)
Glucose, Bld: 103 mg/dL — ABNORMAL HIGH (ref 70–99)
Potassium: 4.7 mEq/L (ref 3.5–5.1)
Sodium: 138 mEq/L (ref 135–145)

## 2022-08-26 LAB — TSH: TSH: 1.8 u[IU]/mL (ref 0.35–5.50)

## 2022-08-26 LAB — PSA: PSA: 0.01 ng/mL — ABNORMAL LOW (ref 0.10–4.00)

## 2022-08-26 LAB — HEMOGLOBIN A1C: Hgb A1c MFr Bld: 5.6 % (ref 4.6–6.5)

## 2022-08-26 MED ORDER — PAROXETINE HCL 20 MG PO TABS: 20.0000 mg | ORAL_TABLET | Freq: Every morning | ORAL | 3 refills | Status: DC

## 2022-08-26 MED ORDER — GABAPENTIN 100 MG PO CAPS: 100.0000 mg | ORAL_CAPSULE | Freq: Four times a day (QID) | ORAL | 3 refills | Status: DC

## 2022-08-26 MED ORDER — TELMISARTAN-HCTZ 80-25 MG PO TABS: 1.0000 | ORAL_TABLET | Freq: Every day | ORAL | 3 refills | Status: DC

## 2022-08-26 NOTE — Addendum Note (Signed)
Addended by: Wyvonne Lenz on: 08/26/2022 05:31 PM   Modules accepted: Orders

## 2022-08-26 NOTE — Progress Notes (Signed)
Subjective:    Patient ID: Ricky Meyers, male    DOB: 12/08/1945, 76 y.o.   MRN: [id number]  HPI Here to follow up on issues. He has no complaints today. His BP is controlled at home, but he ran out of medication last week, hence the high reading today. He has had an eventful year, with having 2 mitral valve clips placed on 01-24-22 to treat regurgitation and with having a left total hip arthroplasty on 05-27-22. He will be starting cardiac rehab 3 days a week soon. His gout is well controlled. His OA is reasonably managed with pain medication. His depression is stable. He uses his CPAP every night.    Review of Systems  Constitutional: Negative.   HENT: Negative.    Eyes: Negative.   Respiratory: Negative.    Cardiovascular: Negative.   Gastrointestinal: Negative.   Genitourinary: Negative.   Musculoskeletal:  Positive for arthralgias and back pain.  Skin: Negative.   Neurological: Negative.   Psychiatric/Behavioral: Negative.         Objective:   Physical Exam Constitutional:      General: He is not in acute distress.    Appearance: Normal appearance. He is well-developed. He is not diaphoretic.  HENT:     Head: Normocephalic and atraumatic.     Right Ear: External ear normal.     Left Ear: External ear normal.     Nose: Nose normal.     Mouth/Throat:     Pharynx: No oropharyngeal exudate.  Eyes:     General: No scleral icterus.       Right eye: No discharge.        Left eye: No discharge.     Conjunctiva/sclera: Conjunctivae normal.     Pupils: Pupils are equal, round, and reactive to light.  Neck:     Thyroid: No thyromegaly.     Vascular: No JVD.     Trachea: No tracheal deviation.  Cardiovascular:     Rate and Rhythm: Normal rate and regular rhythm.     Heart sounds: Normal heart sounds. No murmur heard.    No friction rub. No gallop.  Pulmonary:     Effort: Pulmonary effort is normal. No respiratory distress.     Breath sounds: Normal breath sounds. No  wheezing or rales.  Chest:     Chest wall: No tenderness.  Abdominal:     General: Bowel sounds are normal. There is no distension.     Palpations: Abdomen is soft. There is no mass.     Tenderness: There is no abdominal tenderness. There is no guarding or rebound.  Genitourinary:    Penis: No tenderness.   Musculoskeletal:        General: No tenderness. Normal range of motion.     Cervical back: Neck supple.  Lymphadenopathy:     Cervical: No cervical adenopathy.  Skin:    General: Skin is warm and dry.     Coloration: Skin is not pale.     Findings: No erythema or rash.  Neurological:     Mental Status: He is alert and oriented to person, place, and time.     Cranial Nerves: No cranial nerve deficit.     Motor: No abnormal muscle tone.     Coordination: Coordination normal.     Deep Tendon Reflexes: Reflexes are normal and symmetric. Reflexes normal.  Psychiatric:        Behavior: Behavior normal.        Thought Content:  Thought content normal.        Judgment: Judgment normal.           Assessment & Plan:  His HTN and depression are stable. His sleep apnea and OA are stable. His gout is well controlled. Get fasting labs to check lipids, PSA, etc. We spent a total of ( 34  ) minutes reviewing records and discussing these issues.  Alysia Penna, MD

## 2022-08-26 NOTE — Telephone Encounter (Signed)
CARDIAC REHAB PHASE 2  Called to confirm cardiac rehab appointment. Pt has instructions on how to get here, what to wear and our phone number in case he needs to contact us. Pt verbalized understanding.  Ricky Meyers ACSM-CEP 08/26/2022 4:43 PM

## 2022-08-27 ENCOUNTER — Encounter (HOSPITAL_COMMUNITY): Payer: Self-pay

## 2022-08-27 ENCOUNTER — Encounter (HOSPITAL_COMMUNITY)
Admission: RE | Admit: 2022-08-27 | Discharge: 2022-08-27 | Disposition: A | Discharge status: Home / Self Care | Payer: Medicare Other | Source: Ambulatory Visit | Attending: Interventional Cardiology | Admitting: Interventional Cardiology

## 2022-08-27 VITALS — BP 134/86 | HR 85 | Ht 69.75 in | Wt 245.6 lb

## 2022-08-27 DIAGNOSIS — Z9889 Other specified postprocedural states: Secondary | ICD-10-CM | POA: Insufficient documentation

## 2022-08-27 NOTE — Progress Notes (Signed)
Update from ITP. Telemetry rhythm, Sinus with a bundle branch block this has been previously documented.Harrell Gave RN BSN

## 2022-08-27 NOTE — Progress Notes (Signed)
Cardiac Rehab Medication Review by a Nurse  Does the patient  feel that his/her medications are working for him/her?  YES   Has the patient been experiencing any side effects to the medications prescribed?  NO  Does the patient measure his/her own blood pressure or blood glucose at home?  YES   Does the patient have any problems obtaining medications due to transportation or finances?   NO  Understanding of regimen: good Understanding of indications: good Potential of compliance: good    Nurse comments: Ricky Meyers is taking his medications as prescribed and has a good understanding of what his medications are for. Selwyn checks his blood pressure once a week.    Christa See Meloni Hinz RN 08/27/2022 8:45 AM

## 2022-08-27 NOTE — Progress Notes (Signed)
Cardiac Individual Treatment Plan  Patient Details  Name: Ricky Meyers MRN: [id number] Date of Birth: 07/05/1946 Referring Provider:   Flowsheet Row INTENSIVE CARDIAC REHAB ORIENT from 08/27/2022 in [place]  Referring Provider Larae Grooms, MD       Initial Encounter Date:  [place] from 08/27/2022 in [place]  Date 08/27/22       Visit Diagnosis: 01/24/22 S/P Mitraclip, Mitral valve repair  Patient's Home Medications on Admission:  Current Outpatient Medications:    allopurinol (ZYLOPRIM) 300 MG tablet, TAKE 1 TABLET BY MOUTH EVERY DAY, Disp: 90 tablet, Rfl: 0   amoxicillin (AMOXIL) 500 MG capsule, Take 4 capsules (2,000 mg total) by mouth as directed. Take (4) capsules 1 hour before dental work, Disp: 4 capsule, Rfl: 3   apixaban (ELIQUIS) 2.5 MG TABS tablet, Take 1 tablet (2.5 mg total) by mouth 2 (two) times daily., Disp: 30 tablet, Rfl: 0   atorvastatin (LIPITOR) 40 MG tablet, TAKE 1 TABLET BY MOUTH EVERY DAY, Disp: 90 tablet, Rfl: 0   cyclobenzaprine (FLEXERIL) 10 MG tablet, Take 1 tablet (10 mg total) by mouth 3 (three) times daily as needed for muscle spasms. (Patient taking differently: Take 10 mg by mouth See admin instructions. Take 1 tablet (10 mg) by mouth scheduled in the morning & take 1 tablet (10 mg) by mouth scheduled in the evening, may take an additional dose in in the afternoon if needed for spasms/pain.), Disp: 90 tablet, Rfl: 5   famotidine (PEPCID) 20 MG tablet, Take 1 tablet (20 mg total) by mouth daily after supper., Disp: 90 tablet, Rfl: 3   Melatonin 12 MG TABS, Take 12 mg by mouth at bedtime., Disp: , Rfl:    METAMUCIL FIBER PO, Take 1 Dose by mouth See admin instructions. Take 1 dose by mouth (scheduled) daily, may take a second dose in the afternoon as needed for constipation, Disp: , Rfl:    [START ON 10/08/2022] oxyCODONE-acetaminophen  (PERCOCET) 10-325 MG tablet, Take 1 tablet by mouth every 6 (six) hours as needed for pain., Disp: 120 tablet, Rfl: 0   gabapentin (NEURONTIN) 100 MG capsule, Take 1 capsule (100 mg total) by mouth 4 (four) times daily. TAKE 1 CAPSULE BY MOUTH 4 TIMES DAILY Strength: 100 mg, Disp: 360 capsule, Rfl: 3   PARoxetine (PAXIL) 20 MG tablet, Take 1 tablet (20 mg total) by mouth every morning., Disp: 90 tablet, Rfl: 3   telmisartan-hydrochlorothiazide (MICARDIS HCT) 80-25 MG tablet, Take 1 tablet by mouth daily., Disp: 90 tablet, Rfl: 3  Past Medical History: Past Medical History:  Diagnosis Date   Arthritis    neck and back    Chronic neck pain    Depression    ED (erectile dysfunction)    GERD (gastroesophageal reflux disease)    dysphagia   Gout    sees Dr. Leigh Aurora    Hyperlipidemia    Hypertension    OSA (obstructive sleep apnea) 12/18/2017   Severe with AHI at 70.1/hr and is now on CPAP at 11cm H2o   Prostate cancer [facility])    history of   Pulmonary embolism ([place]) 06/2019   S/P mitral valve clip implantation 01/24/2022   s/p Mitraclip XTW + NTW with Dr. Burt Knack and Dr. Ali Lowe   Sleep apnea    snoring/never checked    Tobacco Use: Social History   Tobacco Use  Smoking Status Never  Smokeless Tobacco Never  Labs: Review Flowsheet  More data exists      Latest Ref Rng & Units 08/22/2020 08/23/2021 12/20/2021 01/22/2022 08/26/2022  Labs for ITP Cardiac and Pulmonary Rehab  Cholestrol 0 - 200 mg/dL 180  153  - - 160   LDL (calc) 0 - 99 mg/dL 88  71  - - 71   HDL-C >39.00 mg/dL 73  65.20  - - 66.00   Trlycerides 0.0 - 149.0 mg/dL 96  84.0  - - 118.0   Hemoglobin A1c 4.6 - 6.5 % - 5.6  - - 5.6   PH, Arterial 7.35 - 7.45 - - 7.394  7.45  -  PCO2 arterial 32 - 48 mmHg - - 40.8  38  -  Bicarbonate 20.0 - 28.0 mmol/L - - 26.8  26.9  25.0  26.4  -  TCO2 22 - 32 mmol/L - - '28  28  26  '$ - -  O2 Saturation % - - 67  71  95  98.5  -    Capillary Blood Glucose: Lab Results   Component Value Date   GLUCAP 127 (H) 12/02/2014     Exercise Target Goals: Exercise Program Goal: Individual exercise prescription set using results from initial 6 min walk test and THRR while considering  patient's activity barriers and safety.   Exercise Prescription Goal: Initial exercise prescription builds to 30-45 minutes a day of aerobic activity, 2-3 days per week.  Home exercise guidelines will be given to patient during program as part of exercise prescription that the participant will acknowledge.  Activity Barriers & Risk Stratification:  Activity Barriers & Cardiac Risk Stratification - 08/27/22 0942       Activity Barriers & Cardiac Risk Stratification   Activity Barriers Shortness of Breath;Muscular Weakness;Deconditioning;Joint Problems;Neck/Spine Problems;Left Hip Replacement;Back Problems;Decreased Ventricular Function    Cardiac Risk Stratification High             6 Minute Walk:  6 Minute Walk     Row Name 08/27/22 0848         6 Minute Walk   Phase Initial     Distance 1048 feet     Walk Time 6 minutes     # of Rest Breaks 0     MPH 1.98     METS 2     RPE 12     Perceived Dyspnea  3     VO2 Peak 6.96     Symptoms Yes (comment)     Comments SOB, RPD = 3     Resting HR 92 bpm     Resting BP 134/86     Resting Oxygen Saturation  95 %     Exercise Oxygen Saturation  during 6 min walk 94 %     Max Ex. HR 125 bpm     Max Ex. BP 150/90     2 Minute Post BP 142/86              Oxygen Initial Assessment:   Oxygen Re-Evaluation:   Oxygen Discharge (Final Oxygen Re-Evaluation):   Initial Exercise Prescription:  Initial Exercise Prescription - 08/27/22 0900       Date of Initial Exercise RX and Referring Provider   Date 08/27/22    Referring Provider Larae Grooms, MD    Expected Discharge Date 11/01/22      NuStep   Level 1    SPM 75    Minutes 25    METs 2      Prescription  Details   Frequency (times per week) 3     Duration Progress to 30 minutes of continuous aerobic without signs/symptoms of physical distress      Intensity   THRR 40-80% of Max Heartrate 57-114    Ratings of Perceived Exertion 11-13    Perceived Dyspnea 0-4      Progression   Progression Continue progressive overload as per policy without signs/symptoms or physical distress.      Resistance Training   Training Prescription Yes    Weight 3 lbs    Reps 10-15             Perform Capillary Blood Glucose checks as needed.  Exercise Prescription Changes:   Exercise Comments:   Exercise Goals and Review:   Exercise Goals     Row Name 08/27/22 0943             Exercise Goals   Increase Physical Activity Yes       Intervention Provide advice, education, support and counseling about physical activity/exercise needs.;Develop an individualized exercise prescription for aerobic and resistive training based on initial evaluation findings, risk stratification, comorbidities and participant's personal goals.       Expected Outcomes Short Term: Attend rehab on a regular basis to increase amount of physical activity.;Long Term: Add in home exercise to make exercise part of routine and to increase amount of physical activity.;Long Term: Exercising regularly at least 3-5 days a week.       Increase Strength and Stamina Yes       Intervention Provide advice, education, support and counseling about physical activity/exercise needs.;Develop an individualized exercise prescription for aerobic and resistive training based on initial evaluation findings, risk stratification, comorbidities and participant's personal goals.       Expected Outcomes Long Term: Improve cardiorespiratory fitness, muscular endurance and strength as measured by increased METs and functional capacity (6MWT);Short Term: Perform resistance training exercises routinely during rehab and add in resistance training at home;Short Term: Increase workloads from initial  exercise prescription for resistance, speed, and METs.       Able to understand and use rate of perceived exertion (RPE) scale Yes       Intervention Provide education and explanation on how to use RPE scale       Expected Outcomes Short Term: Able to use RPE daily in rehab to express subjective intensity level;Long Term:  Able to use RPE to guide intensity level when exercising independently       Knowledge and understanding of Target Heart Rate Range (THRR) Yes       Intervention Provide education and explanation of THRR including how the numbers were predicted and where they are located for reference       Expected Outcomes Short Term: Able to state/look up THRR;Short Term: Able to use daily as guideline for intensity in rehab;Long Term: Able to use THRR to govern intensity when exercising independently       Understanding of Exercise Prescription Yes       Intervention Provide education, explanation, and written materials on patient's individual exercise prescription       Expected Outcomes Short Term: Able to explain program exercise prescription;Long Term: Able to explain home exercise prescription to exercise independently                Exercise Goals Re-Evaluation :   Discharge Exercise Prescription (Final Exercise Prescription Changes):   Nutrition:  Target Goals: Understanding of nutrition guidelines, daily intake of sodium '1500mg'$ , cholesterol '200mg'$ ,  calories 30% from fat and 7% or less from saturated fats, daily to have 5 or more servings of fruits and vegetables.  Biometrics:  Pre Biometrics - 08/27/22 0800       Pre Biometrics   Waist Circumference 46.25 inches    Hip Circumference 47.5 inches    Waist to Hip Ratio 0.97 %    Triceps Skinfold 13 mm    % Body Fat 32.9 %    Grip Strength 33 kg    Flexibility --   Not done, recent hip replacement   Single Leg Stand 29 seconds              Nutrition Therapy Plan and Nutrition Goals:   Nutrition  Assessments:  MEDIFICTS Score Key: ?70 Need to make dietary changes  40-70 Heart Healthy Diet ? 40 Therapeutic Level Cholesterol Diet    Picture Your Plate Scores: <37 Unhealthy dietary pattern with much room for improvement. 41-50 Dietary pattern unlikely to meet recommendations for good health and room for improvement. 51-60 More healthful dietary pattern, with some room for improvement.  >60 Healthy dietary pattern, although there may be some specific behaviors that could be improved.    Nutrition Goals Re-Evaluation:   Nutrition Goals Re-Evaluation:   Nutrition Goals Discharge (Final Nutrition Goals Re-Evaluation):   Psychosocial: Target Goals: Acknowledge presence or absence of significant depression and/or stress, maximize coping skills, provide positive support system. Participant is able to verbalize types and ability to use techniques and skills needed for reducing stress and depression.  Initial Review & Psychosocial Screening:  Initial Psych Review & Screening - 08/27/22 0841       Initial Review   Current issues with History of Depression      Family Dynamics   Good Support System? Yes   Patsy has his domestic partner [place] for support. Gustavus Messing also has two daughters     Barriers   Psychosocial barriers to participate in program The patient should benefit from training in stress management and relaxation.      Screening Interventions   Interventions Encouraged to exercise             Quality of Life Scores:  Quality of Life - 08/27/22 0937       Quality of Life   Select Quality of Life      Quality of Life Scores   Health/Function Pre 21.87 %    Socioeconomic Pre 24.83 %    Psych/Spiritual Pre 23.57 %    Family Pre 24.1 %    GLOBAL Pre 23.11 %            Scores of 19 and below usually indicate a poorer quality of life in these areas.  A difference of  2-3 points is a clinically meaningful difference.  A difference of 2-3 points in the  total score of the Quality of Life Index has been associated with significant improvement in overall quality of life, self-image, physical symptoms, and general health in studies assessing change in quality of life.  PHQ-9: Review Flowsheet  More data exists      08/27/2022 08/26/2022 02/15/2022 11/12/2021 11/08/2020  Depression screen PHQ 2/9  Decreased Interest 0 0 0 0 0  Down, Depressed, Hopeless 0 0 0 0 0  PHQ - 2 Score 0 0 0 0 0  Altered sleeping - 0 0 - -  Tired, decreased energy - 2 2 - -  Change in appetite - 0 0 - -  Feeling bad or failure  about yourself  - 0 0 - -  Trouble concentrating - 0 0 - -  Moving slowly or fidgety/restless - 0 0 - -  Suicidal thoughts - 0 0 - -  PHQ-9 Score - 2 2 - -  Difficult doing work/chores - Not difficult at all Not difficult at all - -   Interpretation of Total Score  Total Score Depression Severity:  1-4 = Minimal depression, 5-9 = Mild depression, 10-14 = Moderate depression, 15-19 = Moderately severe depression, 20-27 = Severe depression   Psychosocial Evaluation and Intervention:   Psychosocial Re-Evaluation:   Psychosocial Discharge (Final Psychosocial Re-Evaluation):   Vocational Rehabilitation: Provide vocational rehab assistance to qualifying candidates.   Vocational Rehab Evaluation & Intervention:  Vocational Rehab - 08/27/22 0842       Initial Vocational Rehab Evaluation & Intervention   Assessment shows need for Vocational Rehabilitation No   Gustavus Messing is retired and does not need vocational rehab a this time            Education: Education Goals: Education classes will be provided on a weekly basis, covering required topics. Participant will state understanding/return demonstration of topics presented.     Core Videos: Exercise    Move It!  Clinical staff conducted group or individual video education with verbal and written material and guidebook.  Patient learns the recommended Pritikin exercise program.  Exercise with the goal of living a long, healthy life. Some of the health benefits of exercise include controlled diabetes, healthier blood pressure levels, improved cholesterol levels, improved heart and lung capacity, improved sleep, and better body composition. Everyone should speak with their doctor before starting or changing an exercise routine.  Biomechanical Limitations Clinical staff conducted group or individual video education with verbal and written material and guidebook.  Patient learns how biomechanical limitations can impact exercise and how we can mitigate and possibly overcome limitations to have an impactful and balanced exercise routine.  Body Composition Clinical staff conducted group or individual video education with verbal and written material and guidebook.  Patient learns that body composition (ratio of muscle mass to fat mass) is a key component to assessing overall fitness, rather than body weight alone. Increased fat mass, especially visceral belly fat, can put [place] at increased risk for metabolic syndrome, type 2 diabetes, heart disease, and even death. It is recommended to combine diet and exercise (cardiovascular and resistance training) to improve your body composition. Seek guidance from your physician and exercise physiologist before implementing an exercise routine.  Exercise Action Plan Clinical staff conducted group or individual video education with verbal and written material and guidebook.  Patient learns the recommended strategies to achieve and enjoy long-term exercise adherence, including variety, self-motivation, self-efficacy, and positive decision making. Benefits of exercise include fitness, good health, weight management, more energy, better sleep, less stress, and overall well-being.  Medical   Heart Disease Risk Reduction Clinical staff conducted group or individual video education with verbal and written material and guidebook.  Patient learns our  heart is our most vital organ as it circulates oxygen, nutrients, white blood cells, and hormones throughout the entire body, and carries waste away. Data supports a plant-based eating plan like the Pritikin Program for its effectiveness in slowing progression of and reversing heart disease. The video provides a number of recommendations to address heart disease.   Metabolic Syndrome and Belly Fat  Clinical staff conducted group or individual video education with verbal and written material and guidebook.  Patient learns what metabolic  syndrome is, how it leads to heart disease, and how one can reverse it and keep it from coming back. You have metabolic syndrome if you have 3 of the following 5 criteria: abdominal obesity, high blood pressure, high triglycerides, low HDL cholesterol, and high blood sugar.  Hypertension and Heart Disease Clinical staff conducted group or individual video education with verbal and written material and guidebook.  Patient learns that high blood pressure, or hypertension, is very common in the [place]. Hypertension is largely due to excessive salt intake, but other important risk factors include being overweight, physical inactivity, drinking too much alcohol, smoking, and not eating enough potassium from fruits and vegetables. High blood pressure is a leading risk factor for heart attack, stroke, congestive heart failure, dementia, kidney failure, and premature death. Long-term effects of excessive salt intake include stiffening of the arteries and thickening of heart muscle and organ damage. Recommendations include ways to reduce hypertension and the risk of heart disease.  Diseases of Our Time - Focusing on Diabetes Clinical staff conducted group or individual video education with verbal and written material and guidebook.  Patient learns why the best way to stop diseases of our time is prevention, through food and other lifestyle changes. Medicine (such as  prescription pills and surgeries) is often only a Band-Aid on the problem, not a long-term solution. Most common diseases of our time include obesity, type 2 diabetes, hypertension, heart disease, and cancer. The Pritikin Program is recommended and has been proven to help reduce, reverse, and/or prevent the damaging effects of metabolic syndrome.  Nutrition   Overview of the Pritikin Eating Plan  Clinical staff conducted group or individual video education with verbal and written material and guidebook.  Patient learns about the [place] for disease risk reduction. The [place] emphasizes a wide variety of unrefined, minimally-processed carbohydrates, like fruits, vegetables, whole grains, and legumes. Go, Caution, and Stop food choices are explained. Plant-based and lean animal proteins are emphasized. Rationale provided for low sodium intake for blood pressure control, low added sugars for blood sugar stabilization, and low added fats and oils for coronary artery disease risk reduction and weight management.  Calorie Density  Clinical staff conducted group or individual video education with verbal and written material and guidebook.  Patient learns about calorie density and how it impacts the Pritikin Eating Plan. Knowing the characteristics of the food you choose will help you decide whether those foods will lead to weight gain or weight loss, and whether you want to consume more or less of them. Weight loss is usually a side effect of the Pritikin Eating Plan because of its focus on low calorie-dense foods.  Label Reading  Clinical staff conducted group or individual video education with verbal and written material and guidebook.  Patient learns about the Pritikin recommended label reading guidelines and corresponding recommendations regarding calorie density, added sugars, sodium content, and whole grains.  Dining Out - Part 1  Clinical staff conducted group or individual  video education with verbal and written material and guidebook.  Patient learns that restaurant meals can be sabotaging because they can be so high in calories, fat, sodium, and/or sugar. Patient learns recommended strategies on how to positively address this and avoid unhealthy pitfalls.  Facts on Fats  Clinical staff conducted group or individual video education with verbal and written material and guidebook.  Patient learns that lifestyle modifications can be just as effective, if not more so, as many medications for lowering  your risk of heart disease. A Pritikin lifestyle can help to reduce your risk of inflammation and atherosclerosis (cholesterol build-up, or plaque, in the artery walls). Lifestyle interventions such as dietary choices and physical activity address the cause of atherosclerosis. A review of the types of fats and their impact on blood cholesterol levels, along with dietary recommendations to reduce fat intake is also included.  Nutrition Action Plan  Clinical staff conducted group or individual video education with verbal and written material and guidebook.  Patient learns how to incorporate Pritikin recommendations into their lifestyle. Recommendations include planning and keeping personal health goals in mind as an important part of their success.  Healthy Mind-Set    Healthy Minds, Bodies, Hearts  Clinical staff conducted group or individual video education with verbal and written material and guidebook.  Patient learns how to identify when they are stressed. Video will discuss the impact of that stress, as well as the many benefits of stress management. Patient will also be introduced to stress management techniques. The way we think, act, and feel has an impact on our hearts.  How Our Thoughts Can Heal Our Hearts  Clinical staff conducted group or individual video education with verbal and written material and guidebook.  Patient learns that negative thoughts can cause  depression and anxiety. This can result in negative lifestyle behavior and serious health problems. Cognitive behavioral therapy is an effective method to help control our thoughts in order to change and improve our emotional outlook.  Additional Videos:  Exercise    Improving Performance  Clinical staff conducted group or individual video education with verbal and written material and guidebook.  Patient learns to use a non-linear approach by alternating intensity levels and lengths of time spent exercising to help burn more calories and lose more body fat. Cardiovascular exercise helps improve heart health, metabolism, hormonal balance, blood sugar control, and recovery from fatigue. Resistance training improves strength, endurance, balance, coordination, reaction time, metabolism, and muscle mass. Flexibility exercise improves circulation, posture, and balance. Seek guidance from your physician and exercise physiologist before implementing an exercise routine and learn your capabilities and proper form for all exercise.  Introduction to Yoga  Clinical staff conducted group or individual video education with verbal and written material and guidebook.  Patient learns about yoga, a discipline of the coming together of mind, breath, and body. The benefits of yoga include improved flexibility, improved range of motion, better posture and core strength, increased lung function, weight loss, and positive self-image. Yoga's heart health benefits include lowered blood pressure, healthier heart rate, decreased cholesterol and triglyceride levels, improved immune function, and reduced stress. Seek guidance from your physician and exercise physiologist before implementing an exercise routine and learn your capabilities and proper form for all exercise.  Medical   Aging: Enhancing Your Quality of Life  Clinical staff conducted group or individual video education with verbal and written material and guidebook.   Patient learns key strategies and recommendations to stay in good physical health and enhance quality of life, such as prevention strategies, having an advocate, securing a Kenneth, and keeping a list of medications and system for tracking them. It also discusses how to avoid risk for bone loss.  Biology of Weight Control  Clinical staff conducted group or individual video education with verbal and written material and guidebook.  Patient learns that weight gain occurs because we consume more calories than we burn (eating more, moving less). Even if your body  weight is normal, you may have higher ratios of fat compared to muscle mass. Too much body fat puts you at increased risk for cardiovascular disease, heart attack, stroke, type 2 diabetes, and obesity-related cancers. In addition to exercise, following the [place] can help reduce your risk.  Decoding Lab Results  Clinical staff conducted group or individual video education with verbal and written material and guidebook.  Patient learns that lab test reflects one measurement whose values change over time and are influenced by many factors, including medication, stress, sleep, exercise, food, hydration, pre-existing medical conditions, and more. It is recommended to use the knowledge from this video to become more involved with your lab results and evaluate your numbers to speak with your doctor.   Diseases of Our Time - Overview  Clinical staff conducted group or individual video education with verbal and written material and guidebook.  Patient learns that according to the CDC, 50% to 70% of chronic diseases (such as obesity, type 2 diabetes, elevated lipids, hypertension, and heart disease) are avoidable through lifestyle improvements including healthier food choices, listening to satiety cues, and increased physical activity.  Sleep Disorders Clinical staff conducted group or individual video  education with verbal and written material and guidebook.  Patient learns how good quality and duration of sleep are important to overall health and well-being. Patient also learns about sleep disorders and how they impact health along with recommendations to address them, including discussing with a physician.  Nutrition  Dining Out - Part 2 Clinical staff conducted group or individual video education with verbal and written material and guidebook.  Patient learns how to plan ahead and communicate in order to maximize their dining experience in a healthy and nutritious manner. Included are recommended food choices based on the type of restaurant the patient is visiting.   Fueling a Best boy conducted group or individual video education with verbal and written material and guidebook.  There is a strong connection between our food choices and our health. Diseases like obesity and type 2 diabetes are very prevalent and are in large-part due to lifestyle choices. The Pritikin Eating Plan provides plenty of food and hunger-curbing satisfaction. It is easy to follow, affordable, and helps reduce health risks.  Menu Workshop  Clinical staff conducted group or individual video education with verbal and written material and guidebook.  Patient learns that restaurant meals can sabotage health goals because they are often packed with calories, fat, sodium, and sugar. Recommendations include strategies to plan ahead and to communicate with the manager, chef, or server to help order a healthier meal.  Planning Your Eating Strategy  Clinical staff conducted group or individual video education with verbal and written material and guidebook.  Patient learns about the [place] and its benefit of reducing the risk of disease. The [place] does not focus on calories. Instead, it emphasizes high-quality, nutrient-rich foods. By knowing the characteristics of the foods, we  choose, we can determine their calorie density and make informed decisions.  Targeting Your Nutrition Priorities  Clinical staff conducted group or individual video education with verbal and written material and guidebook.  Patient learns that lifestyle habits have a tremendous impact on disease risk and progression. This video provides eating and physical activity recommendations based on your personal health goals, such as reducing LDL cholesterol, losing weight, preventing or controlling type 2 diabetes, and reducing high blood pressure.  Vitamins and Minerals  Clinical staff conducted group or  individual video education with verbal and written material and guidebook.  Patient learns different ways to obtain key vitamins and minerals, including through a recommended healthy diet. It is important to discuss all supplements you take with your doctor.   Healthy Mind-Set    Smoking Cessation  Clinical staff conducted group or individual video education with verbal and written material and guidebook.  Patient learns that cigarette smoking and tobacco addiction pose a serious health risk which affects millions of people. Stopping smoking will significantly reduce the risk of heart disease, lung disease, and many forms of cancer. Recommended strategies for quitting are covered, including working with your doctor to develop a successful plan.  Culinary   Becoming a Financial trader conducted group or individual video education with verbal and written material and guidebook.  Patient learns that cooking at home can be healthy, cost-effective, quick, and puts them in control. Keys to cooking healthy recipes will include looking at your recipe, assessing your equipment needs, planning ahead, making it simple, choosing cost-effective seasonal ingredients, and limiting the use of added fats, salts, and sugars.  Cooking - Breakfast and Snacks  Clinical staff conducted group or individual  video education with verbal and written material and guidebook.  Patient learns how important breakfast is to satiety and nutrition through the entire day. Recommendations include key foods to eat during breakfast to help stabilize blood sugar levels and to prevent overeating at meals later in the day. Planning ahead is also a key component.  Cooking - Human resources officer conducted group or individual video education with verbal and written material and guidebook.  Patient learns eating strategies to improve overall health, including an approach to cook more at home. Recommendations include thinking of animal protein as a side on your plate rather than center stage and focusing instead on lower calorie dense options like vegetables, fruits, whole grains, and plant-based proteins, such as beans. Making sauces in large quantities to freeze for later and leaving the skin on your vegetables are also recommended to maximize your experience.  Cooking - Healthy Salads and Dressing Clinical staff conducted group or individual video education with verbal and written material and guidebook.  Patient learns that vegetables, fruits, whole grains, and legumes are the foundations of the [place]. Recommendations include how to incorporate each of these in flavorful and healthy salads, and how to create homemade salad dressings. Proper handling of ingredients is also covered. Cooking - Soups and [employer] - Soups and Desserts Clinical staff conducted group or individual video education with verbal and written material and guidebook.  Patient learns that Pritikin soups and desserts make for easy, nutritious, and delicious snacks and meal components that are low in sodium, fat, sugar, and calorie density, while high in vitamins, minerals, and filling fiber. Recommendations include simple and healthy ideas for soups and desserts.   Overview     The Pritikin Solution Program  Overview Clinical staff conducted group or individual video education with verbal and written material and guidebook.  Patient learns that the results of the [place] Program have been documented in more than 100 articles published in peer-reviewed journals, and the benefits include reducing risk factors for (and, in some cases, even reversing) high cholesterol, high blood pressure, type 2 diabetes, obesity, and more! An overview of the three key pillars of the Pritikin Program will be covered: eating well, doing regular exercise, and having a healthy mind-set.  WORKSHOPS  Exercise: Exercise  Basics: Building Your Action Plan Clinical staff led group instruction and group discussion with PowerPoint presentation and patient guidebook. To enhance the learning environment the use of posters, models and videos may be added. At the conclusion of this workshop, patients will comprehend the difference between physical activity and exercise, as well as the benefits of incorporating both, into their routine. Patients will understand the FITT (Frequency, Intensity, Time, and Type) principle and how to use it to build an exercise action plan. In addition, safety concerns and other considerations for exercise and cardiac rehab will be addressed by the presenter. The purpose of this lesson is to promote a comprehensive and effective weekly exercise routine in order to improve patients' overall level of fitness.   Managing Heart Disease: Your Path to a Healthier Heart Clinical staff led group instruction and group discussion with PowerPoint presentation and patient guidebook. To enhance the learning environment the use of posters, models and videos may be added.At the conclusion of this workshop, patients will understand the anatomy and physiology of the heart. Additionally, they will understand how Pritikin's three pillars impact the risk factors, the progression, and the management of heart disease.  The  purpose of this lesson is to provide a high-level overview of the heart, heart disease, and how the Pritikin lifestyle positively impacts risk factors.  Exercise Biomechanics Clinical staff led group instruction and group discussion with PowerPoint presentation and patient guidebook. To enhance the learning environment the use of posters, models and videos may be added. Patients will learn how the structural parts of their bodies function and how these functions impact their daily activities, movement, and exercise. Patients will learn how to promote a neutral spine, learn how to manage pain, and identify ways to improve their physical movement in order to promote healthy living. The purpose of this lesson is to expose patients to common physical limitations that impact physical activity. Participants will learn practical ways to adapt and manage aches and pains, and to minimize their effect on regular exercise. Patients will learn how to maintain good posture while sitting, walking, and lifting.  Balance Training and Fall Prevention  Clinical staff led group instruction and group discussion with PowerPoint presentation and patient guidebook. To enhance the learning environment the use of posters, models and videos may be added. At the conclusion of this workshop, patients will understand the importance of their sensorimotor skills (vision, proprioception, and the vestibular system) in maintaining their ability to balance as they age. Patients will apply a variety of balancing exercises that are appropriate for their current level of function. Patients will understand the common causes for poor balance, possible solutions to these problems, and ways to modify their physical environment in order to minimize their fall risk. The purpose of this lesson is to teach patients about the importance of maintaining balance as they age and ways to minimize their risk of falling.  WORKSHOPS   Nutrition:   Fueling a Scientist, research (physical sciences) led group instruction and group discussion with PowerPoint presentation and patient guidebook. To enhance the learning environment the use of posters, models and videos may be added. Patients will review the foundational principles of the [place] and understand what constitutes a serving size in each of the food groups. Patients will also learn Pritikin-friendly foods that are better choices when away from home and review make-ahead meal and snack options. Calorie density will be reviewed and applied to three nutrition priorities: weight maintenance, weight loss, and weight gain. The  purpose of this lesson is to reinforce (in a group setting) the key concepts around what patients are recommended to eat and how to apply these guidelines when away from home by planning and selecting Pritikin-friendly options. Patients will understand how calorie density may be adjusted for different weight management goals.  Mindful Eating  Clinical staff led group instruction and group discussion with PowerPoint presentation and patient guidebook. To enhance the learning environment the use of posters, models and videos may be added. Patients will briefly review the concepts of the [place] and the importance of low-calorie dense foods. The concept of mindful eating will be introduced as well as the importance of paying attention to internal hunger signals. Triggers for non-hunger eating and techniques for dealing with triggers will be explored. The purpose of this lesson is to provide patients with the opportunity to review the basic principles of the [place], discuss the value of eating mindfully and how to measure internal cues of hunger and fullness using the Hunger Scale. Patients will also discuss reasons for non-hunger eating and learn strategies to use for controlling emotional eating.  Targeting Your Nutrition Priorities Clinical staff led  group instruction and group discussion with PowerPoint presentation and patient guidebook. To enhance the learning environment the use of posters, models and videos may be added. Patients will learn how to determine their genetic susceptibility to disease by reviewing their family history. Patients will gain insight into the importance of diet as part of an overall healthy lifestyle in mitigating the impact of genetics and other environmental insults. The purpose of this lesson is to provide patients with the opportunity to assess their personal nutrition priorities by looking at their family history, their own health history and current risk factors. Patients will also be able to discuss ways of prioritizing and modifying the [place] for their highest risk areas  Menu  Clinical staff led group instruction and group discussion with PowerPoint presentation and patient guidebook. To enhance the learning environment the use of posters, models and videos may be added. Using menus brought in from [employer], or printed from [employer], patients will apply the [place] dining out guidelines that were presented in the [employer] video. Patients will also be able to practice these guidelines in a variety of provided scenarios. The purpose of this lesson is to provide patients with the opportunity to practice hands-on learning of the [place] with actual menus and practice scenarios.  Label Reading Clinical staff led group instruction and group discussion with PowerPoint presentation and patient guidebook. To enhance the learning environment the use of posters, models and videos may be added. Patients will review and discuss the Pritikin label reading guidelines presented in Pritikin's Label Reading Educational series video. Using fool labels brought in from local grocery stores and markets, patients will apply the label reading guidelines and determine  if the packaged food meet the Pritikin guidelines. The purpose of this lesson is to provide patients with the opportunity to review, discuss, and practice hands-on learning of the Pritikin Label Reading guidelines with actual packaged food labels. [place] Workshops are designed to teach patients ways to prepare quick, simple, and affordable recipes at home. The importance of nutrition's role in chronic disease risk reduction is reflected in its emphasis in the overall Pritikin program. By learning how to prepare essential core Pritikin Eating Plan recipes, patients will increase control over what they eat; be  able to customize the flavor of foods without the use of added salt, sugar, or fat; and improve the quality of the food they consume. By learning a set of core recipes which are easily assembled, quickly prepared, and affordable, patients are more likely to prepare more healthy foods at home. These workshops focus on convenient breakfasts, simple entres, side dishes, and desserts which can be prepared with minimal effort and are consistent with nutrition recommendations for cardiovascular risk reduction. Cooking [employer] are taught by a Engineer, materials (RD) who has been trained by the [employer]. The chef or RD has a clear understanding of the importance of minimizing - if not completely eliminating - added fat, sugar, and sodium in recipes. Throughout the series of [place] Workshop sessions, patients will learn about healthy ingredients and efficient methods of cooking to build confidence in their capability to prepare    Cooking School weekly topics:  Adding Flavor- Sodium-Free  Fast and Healthy Breakfasts  Powerhouse Plant-Based Proteins  Satisfying Salads and Dressings  Simple Sides and Sauces  International Cuisine-Spotlight on the [place] Zones  Delicious Desserts  Savory Soups  Efficiency Cooking - Meals in a  Snap  Tasty Appetizers and Snacks  Comforting Weekend Breakfasts  One-Pot Wonders   Fast Evening Meals  Easy Lely (Psychosocial): New Thoughts, New Behaviors Clinical staff led group instruction and group discussion with PowerPoint presentation and patient guidebook. To enhance the learning environment the use of posters, models and videos may be added. Patients will learn and practice techniques for developing effective health and lifestyle goals. Patients will be able to effectively apply the goal setting process learned to develop at least one new personal goal.  The purpose of this lesson is to expose patients to a new skill set of behavior modification techniques such as techniques setting SMART goals, overcoming barriers, and achieving new thoughts and new behaviors.  Managing Moods and Relationships Clinical staff led group instruction and group discussion with PowerPoint presentation and patient guidebook. To enhance the learning environment the use of posters, models and videos may be added. Patients will learn how emotional and chronic stress factors can impact their health and relationships. They will learn healthy ways to manage their moods and utilize positive coping mechanisms. In addition, ICR patients will learn ways to improve communication skills. The purpose of this lesson is to expose patients to ways of understanding how one's mood and health are intimately connected. Developing a healthy outlook can help build positive relationships and connections with others. Patients will understand the importance of utilizing effective communication skills that include actively listening and being heard. They will learn and understand the importance of the "4 Cs" and especially Connections in fostering of a Healthy Mind-Set.  Healthy Sleep for a Healthy Heart Clinical staff led group instruction and group discussion  with PowerPoint presentation and patient guidebook. To enhance the learning environment the use of posters, models and videos may be added. At the conclusion of this workshop, patients will be able to demonstrate knowledge of the importance of sleep to overall health, well-being, and quality of life. They will understand the symptoms of, and treatments for, common sleep disorders. Patients will also be able to identify daytime and nighttime behaviors which impact sleep, and they will be able to apply these tools to help manage sleep-related challenges. The purpose of this lesson is to provide patients with a general overview  of sleep and outline the importance of quality sleep. Patients will learn about a few of the most common sleep disorders. Patients will also be introduced to the concept of "sleep hygiene," and discover ways to self-manage certain sleeping problems through simple daily behavior changes. Finally, the workshop will motivate patients by clarifying the links between quality sleep and their goals of heart-healthy living.   Recognizing and Reducing Stress Clinical staff led group instruction and group discussion with PowerPoint presentation and patient guidebook. To enhance the learning environment the use of posters, models and videos may be added. At the conclusion of this workshop, patients will be able to understand the types of stress reactions, differentiate between acute and chronic stress, and recognize the impact that chronic stress has on their health. They will also be able to apply different coping mechanisms, such as reframing negative self-talk. Patients will have the opportunity to practice a variety of stress management techniques, such as deep abdominal breathing, progressive muscle relaxation, and/or guided imagery.  The purpose of this lesson is to educate patients on the role of stress in their lives and to provide healthy techniques for coping with it.  Learning  Barriers/Preferences:  Learning Barriers/Preferences - 08/27/22 9381       Learning Barriers/Preferences   Learning Barriers Sight;Hearing   wears reading glasses, HOH, waiting for bilatreral hearing aids   Learning Preferences Skilled Demonstration;Video;Pictoral;Individual Instruction;Group Instruction             Education Topics:  Knowledge Questionnaire Score:  Knowledge Questionnaire Score - 08/27/22 0939       Knowledge Questionnaire Score   Pre Score 22/24             Core Components/Risk Factors/Patient Goals at Admission:  Personal Goals and Risk Factors at Admission - 08/27/22 0939       Core Components/Risk Factors/Patient Goals on Admission    Weight Management Yes;Obesity;Weight Loss    Intervention Weight Management: Develop a combined nutrition and exercise program designed to reach desired caloric intake, while maintaining appropriate intake of nutrient and fiber, sodium and fats, and appropriate energy expenditure required for the weight goal.;Weight Management: Provide education and appropriate resources to help participant work on and attain dietary goals.;Weight Management/Obesity: Establish reasonable short term and long term weight goals.;Obesity: Provide education and appropriate resources to help participant work on and attain dietary goals.    Goal Weight: Long Term 220 lb (99.8 kg)    Expected Outcomes Short Term: Continue to assess and modify interventions until short term weight is achieved;Long Term: Adherence to nutrition and physical activity/exercise program aimed toward attainment of established weight goal;Weight Maintenance: Understanding of the daily nutrition guidelines, which includes 25-35% calories from fat, 7% or less cal from saturated fats, less than '200mg'$  cholesterol, less than 1.5gm of sodium, & 5 or more servings of fruits and vegetables daily;Weight Loss: Understanding of general recommendations for a balanced deficit meal plan,  which promotes 1-2 lb weight loss per week and includes a negative energy balance of [phone] kcal/d;Understanding recommendations for meals to include 15-35% energy as protein, 25-35% energy from fat, 35-60% energy from carbohydrates, less than '200mg'$  of dietary cholesterol, 20-35 gm of total fiber daily;Understanding of distribution of calorie intake throughout the day with the consumption of 4-5 meals/snacks    Hypertension Yes    Intervention Provide education on lifestyle modifcations including regular physical activity/exercise, weight management, moderate sodium restriction and increased consumption of fresh fruit, vegetables, and low fat dairy, alcohol moderation, and smoking  cessation.;Monitor prescription use compliance.    Expected Outcomes Short Term: Continued assessment and intervention until BP is < 140/65m HG in hypertensive participants. < 130/876mHG in hypertensive participants with diabetes, heart failure or chronic kidney disease.;Long Term: Maintenance of blood pressure at goal levels.    Lipids Yes    Intervention Provide education and support for participant on nutrition & aerobic/resistive exercise along with prescribed medications to achieve LDL '70mg'$ , HDL >'40mg'$ .    Expected Outcomes Short Term: Participant states understanding of desired cholesterol values and is compliant with medications prescribed. Participant is following exercise prescription and nutrition guidelines.;Long Term: Cholesterol controlled with medications as prescribed, with individualized exercise RX and with personalized nutrition plan. Value goals: LDL < '70mg'$ , HDL > 40 mg.             Core Components/Risk Factors/Patient Goals Review:    Core Components/Risk Factors/Patient Goals at Discharge (Final Review):    ITP Comments:  ITP Comments     Row Name 08/27/22 0834           ITP Comments Dr TrFransico HimD, Medical Director, [facility] Intensive Cardiac Rehab.  Initial Orientation Packet Reviewed with the patient                Comments: Participant attended orientation for the cardiac rehabilitation program on  08/27/2022  to perform initial intake and exercise walk test. Patient introduced to the [facility] and orientation packet was reviewed. Completed 6-minute walk test, measurements, initial ITP, and exercise prescription.GeDestanid have to stop from 2:30 seconds-3:35 as GeBerneta Sagesxceeded his target heart rate. GeLeopoldoas able to complete his walk test once his heart rate returned to normal range Vital signs stable. Telemetry-normal sinus rhythm, first degree heart block,bundle branch block. GeFardeenid have some shortness of breath 3/4 as he started out at a fast pace initially walking. Shortness of breath resolved with rest.Charbel Los WaMiguel RotaN BSN   Service time was from [id number] [phone]

## 2022-09-02 ENCOUNTER — Telehealth: Payer: Self-pay | Admitting: *Deleted

## 2022-09-02 NOTE — Patient Outreach (Signed)
  Care Coordination   09/02/2022 Name: Ricky Meyers MRN: [id number] DOB: Sep 11, 1946   Care Coordination Outreach Attempts:  An unsuccessful telephone outreach was attempted today to offer the patient information about available care coordination services as a benefit of their health plan.   Follow Up Plan:  Additional outreach attempts will be made to offer the patient care coordination information and services.   Encounter Outcome:  No Answer  Care Coordination Interventions Activated:  No   Care Coordination Interventions:  No, not indicated    Raina Mina, RN Care Management Coordinator [place] Office [phone]

## 2022-09-09 ENCOUNTER — Encounter (HOSPITAL_COMMUNITY)
Admission: RE | Admit: 2022-09-09 | Discharge: 2022-09-09 | Disposition: A | Discharge status: Home / Self Care | Payer: Medicare Other | Source: Ambulatory Visit | Attending: Interventional Cardiology | Admitting: Interventional Cardiology

## 2022-09-09 DIAGNOSIS — Z9889 Other specified postprocedural states: Secondary | ICD-10-CM | POA: Insufficient documentation

## 2022-09-09 DIAGNOSIS — Z5189 Encounter for other specified aftercare: Secondary | ICD-10-CM | POA: Insufficient documentation

## 2022-09-09 NOTE — Progress Notes (Signed)
Daily Session Note  Patient Details  Name: Ricky Meyers MRN: [id number] Date of Birth: [birth year] Referring Provider:   Flowsheet Row INTENSIVE CARDIAC REHAB ORIENT from 08/27/2022 in [place]  Referring Provider Larae Grooms, MD       Encounter Date: 09/09/2022  Check In:  Session Check In - 09/09/22 0828       Check-In   Supervising physician immediately available to respond to emergencies [facility] - Physician supervision    Physician(s) Dr. Margaretann Loveless    Location [facility]    Staff Present Lesly Rubenstein, MS, ACSM-CEP, CCRP, Exercise Physiologist;Olinty Celesta Aver, MS, ACSM-CEP, Exercise Physiologist;Marly Schuld Wilber Oliphant, RN, Quentin Ore, MS, ACSM-CEP, Exercise Physiologist;Bailey Pearline Cables, MS, Exercise Physiologist;Jetta Gilford Rile BS, ACSM-CEP, Exercise Physiologist;Samantha [place], [place], LDN;Ramon Dredge, RN, MHA    Virtual Visit No    Medication changes reported     No    Fall or balance concerns reported    No    Tobacco Cessation No Change    Warm-up and Cool-down Performed as group-led instruction    Resistance Training Performed Yes    VAD Patient? No    PAD/SET Patient? No      Pain Assessment   Currently in Pain? No/denies    Pain Score 0-No pain    Multiple Pain Sites No             Capillary Blood Glucose: No results found for this or any previous visit (from the past 24 hour(s)).   Exercise Prescription Changes - 09/09/22 1000       Response to Exercise   Blood Pressure (Admit) 116/70    Blood Pressure (Exercise) 130/80    Blood Pressure (Exit) 114/78    Heart Rate (Admit) 89 bpm    Heart Rate (Exercise) 108 bpm    Heart Rate (Exit) 98 bpm    Rating of Perceived Exertion (Exercise) 12    Symptoms None    Comments Pt's first day in the CRP2 program    Duration Continue with 30 min of aerobic exercise without signs/symptoms of physical distress.    Intensity THRR unchanged      Progression    Progression Continue to progress workloads to maintain intensity without signs/symptoms of physical distress.    Average METs 3.4      Resistance Training   Training Prescription Yes    Weight 3 lbs    Reps 10-15    Time 10 Minutes      Interval Training   Interval Training No      NuStep   Level 1    SPM 99    Minutes 30    METs 3.4             Social History   Tobacco Use  Smoking Status Never  Smokeless Tobacco Never    Goals Met:  Exercise tolerated well No report of concerns or symptoms today Strength training completed today  Goals Unmet:  Not Applicable  Comments: Pt started Intensive cardiac rehab today.  Pt tolerated light exercise without difficulty. VSS, telemetry-BBB, asymptomatic.  PSYCHOSOCIAL ASSESSMENT:  PHQ-2. Per medical history intake, pt denies any depression or anxiety. Pt completed QOL survey with the following results: Health/function 21.87; Socioeconomic 24.83; Physical/spiritual 23.57; Family 24.10 and Overall 23.11. Scores within normal range which indicates no further intervention is needed. Cherre Huger, BSN Cardiac and Training and development officer  .    Dr. Fransico Him is Medical Director for Cardiac Rehab at [facility]  [facility].

## 2022-09-10 DIAGNOSIS — M25552 Pain in left hip: Secondary | ICD-10-CM | POA: Diagnosis not present

## 2022-09-10 NOTE — Progress Notes (Signed)
Cardiac Individual Treatment Plan  Patient Details  Name: Ricky Meyers MRN: [id number] Date of Birth: December 09, 1945 Referring Provider:   Flowsheet Row INTENSIVE CARDIAC REHAB ORIENT from 08/27/2022 in [facility]  Referring Provider Larae Grooms, MD       Initial Encounter Date:  [place] from 08/27/2022 in [facility]  Date 08/27/22       Visit Diagnosis: 01/24/22 S/P Mitraclip, Mitral valve repair  Patient's Home Medications on Admission:  Current Outpatient Medications:    allopurinol (ZYLOPRIM) 300 MG tablet, TAKE 1 TABLET BY MOUTH EVERY DAY, Disp: 90 tablet, Rfl: 0   amoxicillin (AMOXIL) 500 MG capsule, Take 4 capsules (2,000 mg total) by mouth as directed. Take (4) capsules 1 hour before dental work, Disp: 4 capsule, Rfl: 3   apixaban (ELIQUIS) 2.5 MG TABS tablet, Take 1 tablet (2.5 mg total) by mouth 2 (two) times daily., Disp: 30 tablet, Rfl: 0   atorvastatin (LIPITOR) 40 MG tablet, TAKE 1 TABLET BY MOUTH EVERY DAY, Disp: 90 tablet, Rfl: 0   cyclobenzaprine (FLEXERIL) 10 MG tablet, Take 1 tablet (10 mg total) by mouth 3 (three) times daily as needed for muscle spasms. (Patient taking differently: Take 10 mg by mouth See admin instructions. Take 1 tablet (10 mg) by mouth scheduled in the morning & take 1 tablet (10 mg) by mouth scheduled in the evening, may take an additional dose in in the afternoon if needed for spasms/pain.), Disp: 90 tablet, Rfl: 5   famotidine (PEPCID) 20 MG tablet, Take 1 tablet (20 mg total) by mouth daily after supper., Disp: 90 tablet, Rfl: 3   gabapentin (NEURONTIN) 100 MG capsule, Take 1 capsule (100 mg total) by mouth 4 (four) times daily. TAKE 1 CAPSULE BY MOUTH 4 TIMES DAILY Strength: 100 mg, Disp: 360 capsule, Rfl: 3   Melatonin 12 MG TABS, Take 12 mg by mouth at bedtime., Disp: , Rfl:    METAMUCIL FIBER PO, Take 1 Dose by mouth See admin  instructions. Take 1 dose by mouth (scheduled) daily, may take a second dose in the afternoon as needed for constipation, Disp: , Rfl:    [START ON 10/08/2022] oxyCODONE-acetaminophen (PERCOCET) 10-325 MG tablet, Take 1 tablet by mouth every 6 (six) hours as needed for pain., Disp: 120 tablet, Rfl: 0   PARoxetine (PAXIL) 20 MG tablet, Take 1 tablet (20 mg total) by mouth every morning., Disp: 90 tablet, Rfl: 3   telmisartan-hydrochlorothiazide (MICARDIS HCT) 80-25 MG tablet, Take 1 tablet by mouth daily., Disp: 90 tablet, Rfl: 3  Past Medical History: Past Medical History:  Diagnosis Date   Arthritis    neck and back    Chronic neck pain    Depression    ED (erectile dysfunction)    GERD (gastroesophageal reflux disease)    dysphagia   Gout    sees Dr. Leigh Aurora    Hyperlipidemia    Hypertension    OSA (obstructive sleep apnea) 12/18/2017   Severe with AHI at 70.1/hr and is now on CPAP at 11cm H2o   Prostate cancer [facility])    history of   Pulmonary embolism ([place]) 06/2019   S/P mitral valve clip implantation 01/24/2022   s/p Mitraclip XTW + NTW with Dr. Burt Knack and Dr. Ali Lowe   Sleep apnea    snoring/never checked    Tobacco Use: Social History   Tobacco Use  Smoking Status Never  Smokeless Tobacco Never  Labs: Review Flowsheet  More data exists      Latest Ref Rng & Units 08/22/2020 08/23/2021 12/20/2021 01/22/2022 08/26/2022  Labs for ITP Cardiac and Pulmonary Rehab  Cholestrol 0 - 200 mg/dL 180  153  - - 160   LDL (calc) 0 - 99 mg/dL 88  71  - - 71   HDL-C >39.00 mg/dL 73  65.20  - - 66.00   Trlycerides 0.0 - 149.0 mg/dL 96  84.0  - - 118.0   Hemoglobin A1c 4.6 - 6.5 % - 5.6  - - 5.6   PH, Arterial 7.35 - 7.45 - - 7.394  7.45  -  PCO2 arterial 32 - 48 mmHg - - 40.8  38  -  Bicarbonate 20.0 - 28.0 mmol/L - - 26.8  26.9  25.0  26.4  -  TCO2 22 - 32 mmol/L - - '28  28  26  '$ - -  O2 Saturation % - - 67  71  95  98.5  -    Capillary Blood Glucose: Lab Results   Component Value Date   GLUCAP 127 (H) 12/02/2014     Exercise Target Goals: Exercise Program Goal: Individual exercise prescription set using results from initial 6 min walk test and THRR while considering  patient's activity barriers and safety.   Exercise Prescription Goal: Initial exercise prescription builds to 30-45 minutes a day of aerobic activity, 2-3 days per week.  Home exercise guidelines will be given to patient during program as part of exercise prescription that the participant will acknowledge.  Activity Barriers & Risk Stratification:  Activity Barriers & Cardiac Risk Stratification - 08/27/22 0942       Activity Barriers & Cardiac Risk Stratification   Activity Barriers Shortness of Breath;Muscular Weakness;Deconditioning;Joint Problems;Neck/Spine Problems;Left Hip Replacement;Back Problems;Decreased Ventricular Function    Cardiac Risk Stratification High             6 Minute Walk:  6 Minute Walk     Row Name 08/27/22 0848         6 Minute Walk   Phase Initial     Distance 1048 feet     Walk Time 6 minutes     # of Rest Breaks 1  break due to being over THR     MPH 1.98     METS 2     RPE 12     Perceived Dyspnea  3     VO2 Peak 6.96     Symptoms Yes (comment)     Comments SOB, RPD = 3     Resting HR 92 bpm     Resting BP 134/86     Resting Oxygen Saturation  95 %     Exercise Oxygen Saturation  during 6 min walk 94 %     Max Ex. HR 125 bpm     Max Ex. BP 150/90     2 Minute Post BP 142/86              Oxygen Initial Assessment:   Oxygen Re-Evaluation:   Oxygen Discharge (Final Oxygen Re-Evaluation):   Initial Exercise Prescription:  Initial Exercise Prescription - 08/27/22 0900       Date of Initial Exercise RX and Referring Provider   Date 08/27/22    Referring Provider Larae Grooms, MD    Expected Discharge Date 11/01/22      NuStep   Level 1    SPM 75    Minutes 25    METs  2      Prescription Details    Frequency (times per week) 3    Duration Progress to 30 minutes of continuous aerobic without signs/symptoms of physical distress      Intensity   THRR 40-80% of Max Heartrate 57-114    Ratings of Perceived Exertion 11-13    Perceived Dyspnea 0-4      Progression   Progression Continue progressive overload as per policy without signs/symptoms or physical distress.      Resistance Training   Training Prescription Yes    Weight 3 lbs    Reps 10-15             Perform Capillary Blood Glucose checks as needed.  Exercise Prescription Changes:   Exercise Prescription Changes     Row Name 09/09/22 1000             Response to Exercise   Blood Pressure (Admit) 116/70       Blood Pressure (Exercise) 130/80       Blood Pressure (Exit) 114/78       Heart Rate (Admit) 89 bpm       Heart Rate (Exercise) 108 bpm       Heart Rate (Exit) 98 bpm       Rating of Perceived Exertion (Exercise) 12       Symptoms None       Comments Pt's first day in the [facility] program       Duration Continue with 30 min of aerobic exercise without signs/symptoms of physical distress.       Intensity THRR unchanged         Progression   Progression Continue to progress workloads to maintain intensity without signs/symptoms of physical distress.       Average METs 3.4         Resistance Training   Training Prescription Yes       Weight 3 lbs       Reps 10-15       Time 10 Minutes         Interval Training   Interval Training No         NuStep   Level 1       SPM 99       Minutes 30       METs 3.4                Exercise Comments:   Exercise Comments     Row Name 09/09/22 1059           Exercise Comments Pt's first day in the [facility] program. Pt exercised with no complaints,                Exercise Goals and Review:   Exercise Goals     [place] Name 08/27/22 0943             Exercise Goals   Increase Physical Activity Yes       Intervention Provide advice,  education, support and counseling about physical activity/exercise needs.;Develop an individualized exercise prescription for aerobic and resistive training based on initial evaluation findings, risk stratification, comorbidities and participant's personal goals.       Expected Outcomes Short Term: Attend rehab on a regular basis to increase amount of physical activity.;Long Term: Add in home exercise to make exercise part of routine and to increase amount of physical activity.;Long Term: Exercising regularly at least 3-5 days a week.       Increase  Strength and Stamina Yes       Intervention Provide advice, education, support and counseling about physical activity/exercise needs.;Develop an individualized exercise prescription for aerobic and resistive training based on initial evaluation findings, risk stratification, comorbidities and participant's personal goals.       Expected Outcomes Long Term: Improve cardiorespiratory fitness, muscular endurance and strength as measured by increased METs and functional capacity (6MWT);Short Term: Perform resistance training exercises routinely during rehab and add in resistance training at home;Short Term: Increase workloads from initial exercise prescription for resistance, speed, and METs.       Able to understand and use rate of perceived exertion (RPE) scale Yes       Intervention Provide education and explanation on how to use RPE scale       Expected Outcomes Short Term: Able to use RPE daily in rehab to express subjective intensity level;Long Term:  Able to use RPE to guide intensity level when exercising independently       Knowledge and understanding of Target Heart Rate Range (THRR) Yes       Intervention Provide education and explanation of THRR including how the numbers were predicted and where they are located for reference       Expected Outcomes Short Term: Able to state/look up THRR;Short Term: Able to use daily as guideline for intensity in  rehab;Long Term: Able to use THRR to govern intensity when exercising independently       Understanding of Exercise Prescription Yes       Intervention Provide education, explanation, and written materials on patient's individual exercise prescription       Expected Outcomes Short Term: Able to explain program exercise prescription;Long Term: Able to explain home exercise prescription to exercise independently                Exercise Goals Re-Evaluation :  Exercise Goals Re-Evaluation     Row Name 09/09/22 1058             Exercise Goal Re-Evaluation   Exercise Goals Review Increase Physical Activity;Increase Strength and Stamina;Able to understand and use rate of perceived exertion (RPE) scale;Knowledge and understanding of Target Heart Rate Range (THRR);Understanding of Exercise Prescription       Comments Pt's first day in the [facility] program. Pt understands the exercise RX, RPE scale and THRR,       Expected Outcomes Will continue to montior patient and progress exercise workloads as tolerated.                Discharge Exercise Prescription (Final Exercise Prescription Changes):  Exercise Prescription Changes - 09/09/22 1000       Response to Exercise   Blood Pressure (Admit) 116/70    Blood Pressure (Exercise) 130/80    Blood Pressure (Exit) 114/78    Heart Rate (Admit) 89 bpm    Heart Rate (Exercise) 108 bpm    Heart Rate (Exit) 98 bpm    Rating of Perceived Exertion (Exercise) 12    Symptoms None    Comments Pt's first day in the [facility] program    Duration Continue with 30 min of aerobic exercise without signs/symptoms of physical distress.    Intensity THRR unchanged      Progression   Progression Continue to progress workloads to maintain intensity without signs/symptoms of physical distress.    Average METs 3.4      Resistance Training   Training Prescription Yes    Weight 3 lbs    Reps 10-15  Time 10 Minutes      Interval Training   Interval Training  No      NuStep   Level 1    SPM 99    Minutes 30    METs 3.4             Nutrition:  Target Goals: Understanding of nutrition guidelines, daily intake of sodium '1500mg'$ , cholesterol '200mg'$ , calories 30% from fat and 7% or less from saturated fats, daily to have 5 or more servings of fruits and vegetables.  Biometrics:  Pre Biometrics - 08/27/22 0800       Pre Biometrics   Waist Circumference 46.25 inches    Hip Circumference 47.5 inches    Waist to Hip Ratio 0.97 %    Triceps Skinfold 13 mm    % Body Fat 32.9 %    Grip Strength 33 kg    Flexibility --   Not done, recent hip replacement   Single Leg Stand 29 seconds              Nutrition Therapy Plan and Nutrition Goals:  Nutrition Therapy & Goals - 09/09/22 0943       Nutrition Therapy   Diet Heart Healthy Diet    Drug/Food Interactions Statins/Certain Fruits      Personal Nutrition Goals   Nutrition Goal Patient to identify strategies for managing cardiovascular risk by attending the weekly [facility] education and nutrition courses    Personal Goal #2 Patient to identify food sources and limit daily intake of saturated fat, trans fat, sodium, and refined carbohydrates    Personal Goal #3 Patient to limit sodium to '1500mg'$  per day.    Comments Ricky Meyers lives at home with his significant other, Izora Gala. She is supportive of making dietary changes and she doe sthe majorty of the grocery shopping and cooking. Ricky Meyers reports weight gain of >20# over the last year related to surgery and sedentary habits. He is motivated to lose ~30#.      Intervention Plan   Intervention Prescribe, educate and counsel regarding individualized specific dietary modifications aiming towards targeted core components such as weight, hypertension, lipid management, diabetes, heart failure and other comorbidities.;Nutrition handout(s) given to patient.    Expected Outcomes Short Term Goal: Understand basic principles of dietary content, such as  calories, fat, sodium, cholesterol and nutrients.;Long Term Goal: Adherence to prescribed nutrition plan.             Nutrition Assessments:  MEDIFICTS Score Key: ?70 Need to make dietary changes  40-70 Heart Healthy Diet ? 40 Therapeutic Level Cholesterol Diet    Picture Your Plate Scores: <69 Unhealthy dietary pattern with much room for improvement. 41-50 Dietary pattern unlikely to meet recommendations for good health and room for improvement. 51-60 More healthful dietary pattern, with some room for improvement.  >60 Healthy dietary pattern, although there may be some specific behaviors that could be improved.    Nutrition Goals Re-Evaluation:  Nutrition Goals Re-Evaluation     [place] Name 09/09/22 0943             Goals   Current Weight 240 lb 8.4 oz (109.1 kg)       Comment lipids WNL, A1c WNL       Expected Outcome Ricky Meyers lives at home with his significant other, [place]. She is supportive of making dietary changes and she doe sthe majorty of the grocery shopping and cooking. Ricky Meyers reports weight gain of >20# over the last year related to surgery and sedentary  habits. He is motivated to lose ~30#.                Nutrition Goals Re-Evaluation:  Nutrition Goals Re-Evaluation     [place] Name 09/09/22 0943             Goals   Current Weight 240 lb 8.4 oz (109.1 kg)       Comment lipids WNL, A1c WNL       Expected Outcome Ricky Meyers lives at home with his significant other, [place]. She is supportive of making dietary changes and she doe sthe majorty of the grocery shopping and cooking. Ricky Meyers reports weight gain of >20# over the last year related to surgery and sedentary habits. He is motivated to lose ~30#.                Nutrition Goals Discharge (Final Nutrition Goals Re-Evaluation):  Nutrition Goals Re-Evaluation - 09/09/22 0943       Goals   Current Weight 240 lb 8.4 oz (109.1 kg)    Comment lipids WNL, A1c WNL    Expected Outcome Ricky Meyers lives at home  with his significant other, [place]. She is supportive of making dietary changes and she doe sthe majorty of the grocery shopping and cooking. Ricky Meyers reports weight gain of >20# over the last year related to surgery and sedentary habits. He is motivated to lose ~30#.             Psychosocial: Target Goals: Acknowledge presence or absence of significant depression and/or stress, maximize coping skills, provide positive support system. Participant is able to verbalize types and ability to use techniques and skills needed for reducing stress and depression.  Initial Review & Psychosocial Screening:  Initial Psych Review & Screening - 08/27/22 0841       Initial Review   Current issues with History of Depression      Family Dynamics   Good Support System? Yes   Ricky Meyers has his domestic partner [place] for support. Ricky Meyers also has two daughters     Barriers   Psychosocial barriers to participate in program The patient should benefit from training in stress management and relaxation.      Screening Interventions   Interventions Encouraged to exercise             Quality of Life Scores:  Quality of Life - 08/27/22 0937       Quality of Life   Select Quality of Life      Quality of Life Scores   Health/Function Pre 21.87 %    Socioeconomic Pre 24.83 %    Psych/Spiritual Pre 23.57 %    Family Pre 24.1 %    GLOBAL Pre 23.11 %            Scores of 19 and below usually indicate a poorer quality of life in these areas.  A difference of  2-3 points is a clinically meaningful difference.  A difference of 2-3 points in the total score of the Quality of Life Index has been associated with significant improvement in overall quality of life, self-image, physical symptoms, and general health in studies assessing change in quality of life.  PHQ-9: Review Flowsheet  More data exists      08/27/2022 08/26/2022 02/15/2022 11/12/2021 11/08/2020  Depression screen PHQ 2/9  Decreased Interest 0  0 0 0 0  Down, Depressed, Hopeless 0 0 0 0 0  PHQ - 2 Score 0 0 0 0 0  Altered sleeping - 0 0 - -  Tired, decreased energy - 2 2 - -  Change in appetite - 0 0 - -  Feeling bad or failure about yourself  - 0 0 - -  Trouble concentrating - 0 0 - -  Moving slowly or fidgety/restless - 0 0 - -  Suicidal thoughts - 0 0 - -  PHQ-9 Score - 2 2 - -  Difficult doing work/chores - Not difficult at all Not difficult at all - -   Interpretation of Total Score  Total Score Depression Severity:  1-4 = Minimal depression, 5-9 = Mild depression, 10-14 = Moderate depression, 15-19 = Moderately severe depression, 20-27 = Severe depression   Psychosocial Evaluation and Intervention:   Psychosocial Re-Evaluation:  Psychosocial Re-Evaluation     Ricky Meyers Name 09/10/22 1532             Psychosocial Re-Evaluation   Current issues with History of Depression       Comments Ricky Meyers did not voice any concerns or stressors on his first day of exercise       Expected Outcomes Ricky Meyers will have controled or decreased depression upon completion of intensive cardiac rehab       Interventions Stress management education;Encouraged to attend Cardiac Rehabilitation for the exercise       Continue Psychosocial Services  No Follow up required                Psychosocial Discharge (Final Psychosocial Re-Evaluation):  Psychosocial Re-Evaluation - 09/10/22 1532       Psychosocial Re-Evaluation   Current issues with History of Depression    Comments Ricky Meyers did not voice any concerns or stressors on his first day of exercise    Expected Outcomes Ricky Meyers will have controled or decreased depression upon completion of intensive cardiac rehab    Interventions Stress management education;Encouraged to attend Cardiac Rehabilitation for the exercise    Continue Psychosocial Services  No Follow up required             Vocational Rehabilitation: Provide vocational rehab assistance to qualifying candidates.    Vocational Rehab Evaluation & Intervention:  Vocational Rehab - 08/27/22 0842       Initial Vocational Rehab Evaluation & Intervention   Assessment shows need for Vocational Rehabilitation No   Ricky Meyers is retired and does not need vocational rehab a this time            Education: Education Goals: Education classes will be provided on a weekly basis, covering required topics. Participant will state understanding/return demonstration of topics presented.    Education     Row Name 09/09/22 0900     Education   Cardiac Education Topics [facility]   Select Workshops     Workshops   Educator Dietitian   Select Nutrition   Nutrition Workshop Label Reading   Instruction Review Code 1- Verbalizes Understanding   Class Start Time 0813   Class Stop Time 0904   Class Time Calculation (min) 51 min            Core Videos: Exercise    Move It!  Clinical staff conducted group or individual video education with verbal and written material and guidebook.  Patient learns the recommended [facility] exercise program. Exercise with the goal of living a long, healthy life. Some of the health benefits of exercise include controlled diabetes, healthier blood pressure levels, improved cholesterol levels, improved heart and lung capacity, improved sleep, and better body composition. Everyone should speak with their doctor before starting or changing  an exercise routine.  Biomechanical Limitations Clinical staff conducted group or individual video education with verbal and written material and guidebook.  Patient learns how biomechanical limitations can impact exercise and how we can mitigate and possibly overcome limitations to have an impactful and balanced exercise routine.  Body Composition Clinical staff conducted group or individual video education with verbal and written material and guidebook.  Patient learns that body composition (ratio of muscle mass to fat mass) is a key component to  assessing overall fitness, rather than body weight alone. Increased fat mass, especially visceral belly fat, can put [place] at increased risk for metabolic syndrome, type 2 diabetes, heart disease, and even death. It is recommended to combine diet and exercise (cardiovascular and resistance training) to improve your body composition. Seek guidance from your physician and exercise physiologist before implementing an exercise routine.  Exercise Action Plan Clinical staff conducted group or individual video education with verbal and written material and guidebook.  Patient learns the recommended strategies to achieve and enjoy long-term exercise adherence, including variety, self-motivation, self-efficacy, and positive decision making. Benefits of exercise include fitness, good health, weight management, more energy, better sleep, less stress, and overall well-being.  Medical   Heart Disease Risk Reduction Clinical staff conducted group or individual video education with verbal and written material and guidebook.  Patient learns our heart is our most vital organ as it circulates oxygen, nutrients, white blood cells, and hormones throughout the entire body, and carries waste away. Data supports a plant-based eating plan like the Pritikin Program for its effectiveness in slowing progression of and reversing heart disease. The video provides a number of recommendations to address heart disease.   Metabolic Syndrome and Belly Fat  Clinical staff conducted group or individual video education with verbal and written material and guidebook.  Patient learns what metabolic syndrome is, how it leads to heart disease, and how one can reverse it and keep it from coming back. You have metabolic syndrome if you have 3 of the following 5 criteria: abdominal obesity, high blood pressure, high triglycerides, low HDL cholesterol, and high blood sugar.  Hypertension and Heart Disease Clinical staff conducted group or  individual video education with verbal and written material and guidebook.  Patient learns that high blood pressure, or hypertension, is very common in the [place]. Hypertension is largely due to excessive salt intake, but other important risk factors include being overweight, physical inactivity, drinking too much alcohol, smoking, and not eating enough potassium from fruits and vegetables. High blood pressure is a leading risk factor for heart attack, stroke, congestive heart failure, dementia, kidney failure, and premature death. Long-term effects of excessive salt intake include stiffening of the arteries and thickening of heart muscle and organ damage. Recommendations include ways to reduce hypertension and the risk of heart disease.  Diseases of Our Time - Focusing on Diabetes Clinical staff conducted group or individual video education with verbal and written material and guidebook.  Patient learns why the best way to stop diseases of our time is prevention, through food and other lifestyle changes. Medicine (such as prescription pills and surgeries) is often only a Band-Aid on the problem, not a long-term solution. Most common diseases of our time include obesity, type 2 diabetes, hypertension, heart disease, and cancer. The Pritikin Program is recommended and has been proven to help reduce, reverse, and/or prevent the damaging effects of metabolic syndrome.  Nutrition   Overview of the Pritikin Eating Plan  Clinical staff conducted group or  individual video education with verbal and written material and guidebook.  Patient learns about the [place] for disease risk reduction. The [place] emphasizes a wide variety of unrefined, minimally-processed carbohydrates, like fruits, vegetables, whole grains, and legumes. Go, Caution, and Stop food choices are explained. Plant-based and lean animal proteins are emphasized. Rationale provided for low sodium intake for blood  pressure control, low added sugars for blood sugar stabilization, and low added fats and oils for coronary artery disease risk reduction and weight management.  Calorie Density  Clinical staff conducted group or individual video education with verbal and written material and guidebook.  Patient learns about calorie density and how it impacts the Pritikin Eating Plan. Knowing the characteristics of the food you choose will help you decide whether those foods will lead to weight gain or weight loss, and whether you want to consume more or less of them. Weight loss is usually a side effect of the Pritikin Eating Plan because of its focus on low calorie-dense foods.  Label Reading  Clinical staff conducted group or individual video education with verbal and written material and guidebook.  Patient learns about the Pritikin recommended label reading guidelines and corresponding recommendations regarding calorie density, added sugars, sodium content, and whole grains.  Dining Out - Part 1  Clinical staff conducted group or individual video education with verbal and written material and guidebook.  Patient learns that restaurant meals can be sabotaging because they can be so high in calories, fat, sodium, and/or sugar. Patient learns recommended strategies on how to positively address this and avoid unhealthy pitfalls.  Facts on Fats  Clinical staff conducted group or individual video education with verbal and written material and guidebook.  Patient learns that lifestyle modifications can be just as effective, if not more so, as many medications for lowering your risk of heart disease. A Pritikin lifestyle can help to reduce your risk of inflammation and atherosclerosis (cholesterol build-up, or plaque, in the artery walls). Lifestyle interventions such as dietary choices and physical activity address the cause of atherosclerosis. A review of the types of fats and their impact on blood cholesterol levels,  along with dietary recommendations to reduce fat intake is also included.  Nutrition Action Plan  Clinical staff conducted group or individual video education with verbal and written material and guidebook.  Patient learns how to incorporate Pritikin recommendations into their lifestyle. Recommendations include planning and keeping personal health goals in mind as an important part of their success.  Healthy Mind-Set    Healthy Minds, Bodies, Hearts  Clinical staff conducted group or individual video education with verbal and written material and guidebook.  Patient learns how to identify when they are stressed. Video will discuss the impact of that stress, as well as the many benefits of stress management. Patient will also be introduced to stress management techniques. The way we think, act, and feel has an impact on our hearts.  How Our Thoughts Can Heal Our Hearts  Clinical staff conducted group or individual video education with verbal and written material and guidebook.  Patient learns that negative thoughts can cause depression and anxiety. This can result in negative lifestyle behavior and serious health problems. Cognitive behavioral therapy is an effective method to help control our thoughts in order to change and improve our emotional outlook.  Additional Videos:  Exercise    Improving Performance  Clinical staff conducted group or individual video education with verbal and written material and guidebook.  Patient learns  to use a non-linear approach by alternating intensity levels and lengths of time spent exercising to help burn more calories and lose more body fat. Cardiovascular exercise helps improve heart health, metabolism, hormonal balance, blood sugar control, and recovery from fatigue. Resistance training improves strength, endurance, balance, coordination, reaction time, metabolism, and muscle mass. Flexibility exercise improves circulation, posture, and balance. Seek  guidance from your physician and exercise physiologist before implementing an exercise routine and learn your capabilities and proper form for all exercise.  Introduction to Yoga  Clinical staff conducted group or individual video education with verbal and written material and guidebook.  Patient learns about yoga, a discipline of the coming together of mind, breath, and body. The benefits of yoga include improved flexibility, improved range of motion, better posture and core strength, increased lung function, weight loss, and positive self-image. Yoga's heart health benefits include lowered blood pressure, healthier heart rate, decreased cholesterol and triglyceride levels, improved immune function, and reduced stress. Seek guidance from your physician and exercise physiologist before implementing an exercise routine and learn your capabilities and proper form for all exercise.  Medical   Aging: Enhancing Your Quality of Life  Clinical staff conducted group or individual video education with verbal and written material and guidebook.  Patient learns key strategies and recommendations to stay in good physical health and enhance quality of life, such as prevention strategies, having an advocate, securing a [place], and keeping a list of medications and system for tracking them. It also discusses how to avoid risk for bone loss.  Biology of Weight Control  Clinical staff conducted group or individual video education with verbal and written material and guidebook.  Patient learns that weight gain occurs because we consume more calories than we burn (eating more, moving less). Even if your body weight is normal, you may have higher ratios of fat compared to muscle mass. Too much body fat puts you at increased risk for cardiovascular disease, heart attack, stroke, type 2 diabetes, and obesity-related cancers. In addition to exercise, following the [place] can help  reduce your risk.  Decoding Lab Results  Clinical staff conducted group or individual video education with verbal and written material and guidebook.  Patient learns that lab test reflects one measurement whose values change over time and are influenced by many factors, including medication, stress, sleep, exercise, food, hydration, pre-existing medical conditions, and more. It is recommended to use the knowledge from this video to become more involved with your lab results and evaluate your numbers to speak with your doctor.   Diseases of Our Time - Overview  Clinical staff conducted group or individual video education with verbal and written material and guidebook.  Patient learns that according to the CDC, 50% to 70% of chronic diseases (such as obesity, type 2 diabetes, elevated lipids, hypertension, and heart disease) are avoidable through lifestyle improvements including healthier food choices, listening to satiety cues, and increased physical activity.  Sleep Disorders Clinical staff conducted group or individual video education with verbal and written material and guidebook.  Patient learns how good quality and duration of sleep are important to overall health and well-being. Patient also learns about sleep disorders and how they impact health along with recommendations to address them, including discussing with a physician.  Nutrition  Dining Out - Part 2 Clinical staff conducted group or individual video education with verbal and written material and guidebook.  Patient learns how to plan ahead and communicate  in order to maximize their dining experience in a healthy and nutritious manner. Included are recommended food choices based on the type of restaurant the patient is visiting.   Fueling a Best boy conducted group or individual video education with verbal and written material and guidebook.  There is a strong connection between our food choices and our health.  Diseases like obesity and type 2 diabetes are very prevalent and are in large-part due to lifestyle choices. The Pritikin Eating Plan provides plenty of food and hunger-curbing satisfaction. It is easy to follow, affordable, and helps reduce health risks.  Menu Workshop  Clinical staff conducted group or individual video education with verbal and written material and guidebook.  Patient learns that restaurant meals can sabotage health goals because they are often packed with calories, fat, sodium, and sugar. Recommendations include strategies to plan ahead and to communicate with the manager, chef, or server to help order a healthier meal.  Planning Your Eating Strategy  Clinical staff conducted group or individual video education with verbal and written material and guidebook.  Patient learns about the [place] and its benefit of reducing the risk of disease. The [place] does not focus on calories. Instead, it emphasizes high-quality, nutrient-rich foods. By knowing the characteristics of the foods, we choose, we can determine their calorie density and make informed decisions.  Targeting Your Nutrition Priorities  Clinical staff conducted group or individual video education with verbal and written material and guidebook.  Patient learns that lifestyle habits have a tremendous impact on disease risk and progression. This video provides eating and physical activity recommendations based on your personal health goals, such as reducing LDL cholesterol, losing weight, preventing or controlling type 2 diabetes, and reducing high blood pressure.  Vitamins and Minerals  Clinical staff conducted group or individual video education with verbal and written material and guidebook.  Patient learns different ways to obtain key vitamins and minerals, including through a recommended healthy diet. It is important to discuss all supplements you take with your doctor.   Healthy Mind-Set     Smoking Cessation  Clinical staff conducted group or individual video education with verbal and written material and guidebook.  Patient learns that cigarette smoking and tobacco addiction pose a serious health risk which affects millions of people. Stopping smoking will significantly reduce the risk of heart disease, lung disease, and many forms of cancer. Recommended strategies for quitting are covered, including working with your doctor to develop a successful plan.  Culinary   Becoming a Financial trader conducted group or individual video education with verbal and written material and guidebook.  Patient learns that cooking at home can be healthy, cost-effective, quick, and puts them in control. Keys to cooking healthy recipes will include looking at your recipe, assessing your equipment needs, planning ahead, making it simple, choosing cost-effective seasonal ingredients, and limiting the use of added fats, salts, and sugars.  Cooking - Breakfast and Snacks  Clinical staff conducted group or individual video education with verbal and written material and guidebook.  Patient learns how important breakfast is to satiety and nutrition through the entire day. Recommendations include key foods to eat during breakfast to help stabilize blood sugar levels and to prevent overeating at meals later in the day. Planning ahead is also a key component.  Cooking - Human resources officer conducted group or individual video education with verbal and written material and guidebook.  Patient learns  eating strategies to improve overall health, including an approach to cook more at home. Recommendations include thinking of animal protein as a side on your plate rather than center stage and focusing instead on lower calorie dense options like vegetables, fruits, whole grains, and plant-based proteins, such as beans. Making sauces in large quantities to freeze for later and leaving the skin  on your vegetables are also recommended to maximize your experience.  Cooking - Healthy Salads and Dressing Clinical staff conducted group or individual video education with verbal and written material and guidebook.  Patient learns that vegetables, fruits, whole grains, and legumes are the foundations of the [place]. Recommendations include how to incorporate each of these in flavorful and healthy salads, and how to create homemade salad dressings. Proper handling of ingredients is also covered. Cooking - Soups and [employer] - Soups and Desserts Clinical staff conducted group or individual video education with verbal and written material and guidebook.  Patient learns that Pritikin soups and desserts make for easy, nutritious, and delicious snacks and meal components that are low in sodium, fat, sugar, and calorie density, while high in vitamins, minerals, and filling fiber. Recommendations include simple and healthy ideas for soups and desserts.   Overview     The Pritikin Solution Program Overview Clinical staff conducted group or individual video education with verbal and written material and guidebook.  Patient learns that the results of the [place] Program have been documented in more than 100 articles published in peer-reviewed journals, and the benefits include reducing risk factors for (and, in some cases, even reversing) high cholesterol, high blood pressure, type 2 diabetes, obesity, and more! An overview of the three key pillars of the Pritikin Program will be covered: eating well, doing regular exercise, and having a healthy mind-set.  WORKSHOPS  Exercise: Exercise Basics: Building Your Action Plan Clinical staff led group instruction and group discussion with PowerPoint presentation and patient guidebook. To enhance the learning environment the use of posters, models and videos may be added. At the conclusion of this workshop, patients will comprehend the  difference between physical activity and exercise, as well as the benefits of incorporating both, into their routine. Patients will understand the FITT (Frequency, Intensity, Time, and Type) principle and how to use it to build an exercise action plan. In addition, safety concerns and other considerations for exercise and cardiac rehab will be addressed by the presenter. The purpose of this lesson is to promote a comprehensive and effective weekly exercise routine in order to improve patients' overall level of fitness.   Managing Heart Disease: Your Path to a Healthier Heart Clinical staff led group instruction and group discussion with PowerPoint presentation and patient guidebook. To enhance the learning environment the use of posters, models and videos may be added.At the conclusion of this workshop, patients will understand the anatomy and physiology of the heart. Additionally, they will understand how Pritikin's three pillars impact the risk factors, the progression, and the management of heart disease.  The purpose of this lesson is to provide a high-level overview of the heart, heart disease, and how the Pritikin lifestyle positively impacts risk factors.  Exercise Biomechanics Clinical staff led group instruction and group discussion with PowerPoint presentation and patient guidebook. To enhance the learning environment the use of posters, models and videos may be added. Patients will learn how the structural parts of their bodies function and how these functions impact their daily activities, movement, and exercise. Patients will learn how  to promote a neutral spine, learn how to manage pain, and identify ways to improve their physical movement in order to promote healthy living. The purpose of this lesson is to expose patients to common physical limitations that impact physical activity. Participants will learn practical ways to adapt and manage aches and pains, and to minimize their  effect on regular exercise. Patients will learn how to maintain good posture while sitting, walking, and lifting.  Balance Training and Fall Prevention  Clinical staff led group instruction and group discussion with PowerPoint presentation and patient guidebook. To enhance the learning environment the use of posters, models and videos may be added. At the conclusion of this workshop, patients will understand the importance of their sensorimotor skills (vision, proprioception, and the vestibular system) in maintaining their ability to balance as they age. Patients will apply a variety of balancing exercises that are appropriate for their current level of function. Patients will understand the common causes for poor balance, possible solutions to these problems, and ways to modify their physical environment in order to minimize their fall risk. The purpose of this lesson is to teach patients about the importance of maintaining balance as they age and ways to minimize their risk of falling.  WORKSHOPS   Nutrition:  Fueling a Scientist, research (physical sciences) led group instruction and group discussion with PowerPoint presentation and patient guidebook. To enhance the learning environment the use of posters, models and videos may be added. Patients will review the foundational principles of the [place] and understand what constitutes a serving size in each of the food groups. Patients will also learn Pritikin-friendly foods that are better choices when away from home and review make-ahead meal and snack options. Calorie density will be reviewed and applied to three nutrition priorities: weight maintenance, weight loss, and weight gain. The purpose of this lesson is to reinforce (in a group setting) the key concepts around what patients are recommended to eat and how to apply these guidelines when away from home by planning and selecting Pritikin-friendly options. Patients will understand how calorie  density may be adjusted for different weight management goals.  Mindful Eating  Clinical staff led group instruction and group discussion with PowerPoint presentation and patient guidebook. To enhance the learning environment the use of posters, models and videos may be added. Patients will briefly review the concepts of the [place] and the importance of low-calorie dense foods. The concept of mindful eating will be introduced as well as the importance of paying attention to internal hunger signals. Triggers for non-hunger eating and techniques for dealing with triggers will be explored. The purpose of this lesson is to provide patients with the opportunity to review the basic principles of the [place], discuss the value of eating mindfully and how to measure internal cues of hunger and fullness using the Hunger Scale. Patients will also discuss reasons for non-hunger eating and learn strategies to use for controlling emotional eating.  Targeting Your Nutrition Priorities Clinical staff led group instruction and group discussion with PowerPoint presentation and patient guidebook. To enhance the learning environment the use of posters, models and videos may be added. Patients will learn how to determine their genetic susceptibility to disease by reviewing their family history. Patients will gain insight into the importance of diet as part of an overall healthy lifestyle in mitigating the impact of genetics and other environmental insults. The purpose of this lesson is to provide patients with the opportunity to assess  their personal nutrition priorities by looking at their family history, their own health history and current risk factors. Patients will also be able to discuss ways of prioritizing and modifying the [place] for their highest risk areas  Menu  Clinical staff led group instruction and group discussion with PowerPoint presentation and patient guidebook. To  enhance the learning environment the use of posters, models and videos may be added. Using menus brought in from [employer], or printed from [employer], patients will apply the [place] dining out guidelines that were presented in the [employer] video. Patients will also be able to practice these guidelines in a variety of provided scenarios. The purpose of this lesson is to provide patients with the opportunity to practice hands-on learning of the [place] with actual menus and practice scenarios.  Label Reading Clinical staff led group instruction and group discussion with PowerPoint presentation and patient guidebook. To enhance the learning environment the use of posters, models and videos may be added. Patients will review and discuss the Pritikin label reading guidelines presented in Pritikin's Label Reading Educational series video. Using fool labels brought in from local grocery stores and markets, patients will apply the label reading guidelines and determine if the packaged food meet the Pritikin guidelines. The purpose of this lesson is to provide patients with the opportunity to review, discuss, and practice hands-on learning of the Pritikin Label Reading guidelines with actual packaged food labels. [place] Workshops are designed to teach patients ways to prepare quick, simple, and affordable recipes at home. The importance of nutrition's role in chronic disease risk reduction is reflected in its emphasis in the overall Pritikin program. By learning how to prepare essential core Pritikin Eating Plan recipes, patients will increase control over what they eat; be able to customize the flavor of foods without the use of added salt, sugar, or fat; and improve the quality of the food they consume. By learning a set of core recipes which are easily assembled, quickly prepared, and affordable, patients are more likely to  prepare more healthy foods at home. These workshops focus on convenient breakfasts, simple entres, side dishes, and desserts which can be prepared with minimal effort and are consistent with nutrition recommendations for cardiovascular risk reduction. Cooking [employer] are taught by a Engineer, materials (RD) who has been trained by the [employer]. The chef or RD has a clear understanding of the importance of minimizing - if not completely eliminating - added fat, sugar, and sodium in recipes. Throughout the series of [place] Workshop sessions, patients will learn about healthy ingredients and efficient methods of cooking to build confidence in their capability to prepare    Cooking School weekly topics:  Adding Flavor- Sodium-Free  Fast and Healthy Breakfasts  Powerhouse Plant-Based Proteins  Satisfying Salads and Dressings  Simple Sides and Sauces  International Cuisine-Spotlight on the [place]  Delicious Desserts  Savory Soups  Efficiency Cooking - Meals in a Snap  Tasty Appetizers and Snacks  Comforting Weekend Breakfasts  One-Pot Wonders   Fast Evening Meals  Easy [place] (Psychosocial): New Thoughts, New Behaviors Clinical staff led group instruction and group discussion with PowerPoint presentation and patient guidebook. To enhance the learning environment the use of posters, models and videos may be added. Patients will learn and practice techniques for developing effective health and lifestyle goals.  Patients will be able to effectively apply the goal setting process learned to develop at least one new personal goal.  The purpose of this lesson is to expose patients to a new skill set of behavior modification techniques such as techniques setting SMART goals, overcoming barriers, and achieving new thoughts and new behaviors.  Managing Moods and Relationships Clinical  staff led group instruction and group discussion with PowerPoint presentation and patient guidebook. To enhance the learning environment the use of posters, models and videos may be added. Patients will learn how emotional and chronic stress factors can impact their health and relationships. They will learn healthy ways to manage their moods and utilize positive coping mechanisms. In addition, [facility] patients will learn ways to improve communication skills. The purpose of this lesson is to expose patients to ways of understanding how one's mood and health are intimately connected. Developing a healthy outlook can help build positive relationships and connections with others. Patients will understand the importance of utilizing effective communication skills that include actively listening and being heard. They will learn and understand the importance of the "4 Cs" and especially Connections in fostering of a Healthy Mind-Set.  Healthy Sleep for a Healthy Heart Clinical staff led group instruction and group discussion with PowerPoint presentation and patient guidebook. To enhance the learning environment the use of posters, models and videos may be added. At the conclusion of this workshop, patients will be able to demonstrate knowledge of the importance of sleep to overall health, well-being, and quality of life. They will understand the symptoms of, and treatments for, common sleep disorders. Patients will also be able to identify daytime and nighttime behaviors which impact sleep, and they will be able to apply these tools to help manage sleep-related challenges. The purpose of this lesson is to provide patients with a general overview of sleep and outline the importance of quality sleep. Patients will learn about a few of the most common sleep disorders. Patients will also be introduced to the concept of "sleep hygiene," and discover ways to self-manage certain sleeping problems through simple daily behavior  changes. Finally, the workshop will motivate patients by clarifying the links between quality sleep and their goals of heart-healthy living.   Recognizing and Reducing Stress Clinical staff led group instruction and group discussion with PowerPoint presentation and patient guidebook. To enhance the learning environment the use of posters, models and videos may be added. At the conclusion of this workshop, patients will be able to understand the types of stress reactions, differentiate between acute and chronic stress, and recognize the impact that chronic stress has on their health. They will also be able to apply different coping mechanisms, such as reframing negative self-talk. Patients will have the opportunity to practice a variety of stress management techniques, such as deep abdominal breathing, progressive muscle relaxation, and/or guided imagery.  The purpose of this lesson is to educate patients on the role of stress in their lives and to provide healthy techniques for coping with it.  Learning Barriers/Preferences:  Learning Barriers/Preferences - 08/27/22 3295       Learning Barriers/Preferences   Learning Barriers Sight;Hearing   wears reading glasses, HOH, waiting for bilatreral hearing aids   Learning Preferences Skilled Demonstration;Video;Pictoral;Individual Instruction;Group Instruction             Education Topics:  Knowledge Questionnaire Score:  Knowledge Questionnaire Score - 08/27/22 0939       Knowledge Questionnaire Score   Pre Score 22/24  Core Components/Risk Factors/Patient Goals at Admission:  Personal Goals and Risk Factors at Admission - 08/27/22 0939       Core Components/Risk Factors/Patient Goals on Admission    Weight Management Yes;Obesity;Weight Loss    Intervention Weight Management: Develop a combined nutrition and exercise program designed to reach desired caloric intake, while maintaining appropriate intake of nutrient and  fiber, sodium and fats, and appropriate energy expenditure required for the weight goal.;Weight Management: Provide education and appropriate resources to help participant work on and attain dietary goals.;Weight Management/Obesity: Establish reasonable short term and long term weight goals.;Obesity: Provide education and appropriate resources to help participant work on and attain dietary goals.    Goal Weight: Long Term 220 lb (99.8 kg)    Expected Outcomes Short Term: Continue to assess and modify interventions until short term weight is achieved;Long Term: Adherence to nutrition and physical activity/exercise program aimed toward attainment of established weight goal;Weight Maintenance: Understanding of the daily nutrition guidelines, which includes 25-35% calories from fat, 7% or less cal from saturated fats, less than '200mg'$  cholesterol, less than 1.5gm of sodium, & 5 or more servings of fruits and vegetables daily;Weight Loss: Understanding of general recommendations for a balanced deficit meal plan, which promotes 1-2 lb weight loss per week and includes a negative energy balance of [phone] kcal/d;Understanding recommendations for meals to include 15-35% energy as protein, 25-35% energy from fat, 35-60% energy from carbohydrates, less than '200mg'$  of dietary cholesterol, 20-35 gm of total fiber daily;Understanding of distribution of calorie intake throughout the day with the consumption of 4-5 meals/snacks    Hypertension Yes    Intervention Provide education on lifestyle modifcations including regular physical activity/exercise, weight management, moderate sodium restriction and increased consumption of fresh fruit, vegetables, and low fat dairy, alcohol moderation, and smoking cessation.;Monitor prescription use compliance.    Expected Outcomes Short Term: Continued assessment and intervention until BP is < 140/35m HG in hypertensive participants. < 130/839mHG in hypertensive participants with  diabetes, heart failure or chronic kidney disease.;Long Term: Maintenance of blood pressure at goal levels.    Lipids Yes    Intervention Provide education and support for participant on nutrition & aerobic/resistive exercise along with prescribed medications to achieve LDL '70mg'$ , HDL >'40mg'$ .    Expected Outcomes Short Term: Participant states understanding of desired cholesterol values and is compliant with medications prescribed. Participant is following exercise prescription and nutrition guidelines.;Long Term: Cholesterol controlled with medications as prescribed, with individualized exercise RX and with personalized nutrition plan. Value goals: LDL < '70mg'$ , HDL > 40 mg.             Core Components/Risk Factors/Patient Goals Review:   Goals and Risk Factor Review     Row Name 09/10/22 1534             Core Components/Risk Factors/Patient Goals Review   Personal Goals Review Weight Management/Obesity;Hypertension;Lipids       Review Ricky Meyers intensive cardiac rehab on 09/09/22 and did well with exercise. vital signs were stable       Expected Outcomes Ricky Meyers continue to participate in intensive cardiac rehab for exercise, nutrition and lifestyle modifications                Core Components/Risk Factors/Patient Goals at Discharge (Final Review):   Goals and Risk Factor Review - 09/10/22 1534       Core Components/Risk Factors/Patient Goals Review   Personal Goals Review Weight Management/Obesity;Hypertension;Lipids    Review Ricky Meyers intensive cardiac rehab  on 09/09/22 and did well with exercise. vital signs were stable    Expected Outcomes Jedrick will continue to participate in intensive cardiac rehab for exercise, nutrition and lifestyle modifications             ITP Comments:  ITP Comments     Row Name 08/27/22 0834 09/10/22 1531         ITP Comments Dr Fransico Him MD, Medical Director, Introduction to [facility]/ Intensive  Cardiac Rehab. Initial Orientation Packet Reviewed with the patient Christy started intensive cardiac rehab on 09/09/22 and did well with exercise.               Comments: See ITP comments.Harrell Gave RN BSN

## 2022-09-11 ENCOUNTER — Encounter (HOSPITAL_COMMUNITY): Admission: RE | Admit: 2022-09-11 | Payer: Medicare Other | Source: Ambulatory Visit

## 2022-09-11 ENCOUNTER — Telehealth (HOSPITAL_COMMUNITY): Payer: Self-pay | Admitting: Family Medicine

## 2022-09-11 ENCOUNTER — Telehealth (HOSPITAL_COMMUNITY): Payer: Self-pay

## 2022-09-12 ENCOUNTER — Ambulatory Visit (INDEPENDENT_AMBULATORY_CARE_PROVIDER_SITE_OTHER): Payer: Medicare Other

## 2022-09-12 ENCOUNTER — Ambulatory Visit (INDEPENDENT_AMBULATORY_CARE_PROVIDER_SITE_OTHER): Payer: Medicare Other | Admitting: Family Medicine

## 2022-09-12 ENCOUNTER — Encounter: Payer: Self-pay | Admitting: Family Medicine

## 2022-09-12 VITALS — BP 98/76 | HR 89 | Temp 97.9°F | Wt 237.0 lb

## 2022-09-12 DIAGNOSIS — S8002XA Contusion of left knee, initial encounter: Secondary | ICD-10-CM | POA: Diagnosis not present

## 2022-09-12 DIAGNOSIS — Z043 Encounter for examination and observation following other accident: Secondary | ICD-10-CM | POA: Diagnosis not present

## 2022-09-12 DIAGNOSIS — I2510 Atherosclerotic heart disease of native coronary artery without angina pectoris: Secondary | ICD-10-CM | POA: Diagnosis not present

## 2022-09-12 MED ORDER — GABAPENTIN 100 MG PO CAPS: 100.0000 mg | ORAL_CAPSULE | Freq: Two times a day (BID) | ORAL | 3 refills | Status: DC

## 2022-09-12 NOTE — Progress Notes (Signed)
   Subjective:    Patient ID: Ricky Meyers, male    DOB: [birth year], 76 y.o.   MRN: [id number]  HPI Here to check his left knee after he fell on it in his yard 3 days ago. He was raking leaves, and as he leaned forward he lost his balance. He fell landing on both knees. Since then he has had swelling and pain in the knee, although he says it has improved quite a bit in the past 24 hours. He has been applying ice packs. He is already on chronic pain medication.    Review of Systems  Constitutional: Negative.   Respiratory: Negative.    Cardiovascular: Negative.   Musculoskeletal:  Positive for arthralgias.       Objective:   Physical Exam Constitutional:      General: He is not in acute distress.    Comments: Walks with a walker   Cardiovascular:     Rate and Rhythm: Normal rate and regular rhythm.     Pulses: Normal pulses.     Heart sounds: Normal heart sounds.  Pulmonary:     Effort: Pulmonary effort is normal.     Breath sounds: Normal breath sounds.  Musculoskeletal:     Comments: Left knee is moderately swollen. Extension is full but flexion is limited by pain. He is tender over the patella and over the lateral joint space. McMurray's is negative.   Neurological:     Mental Status: He is alert.           Assessment & Plan:  Knee contusion. He will stay off the leg and apply ice to the knee. He can wear an elastic support sleeve. We will get Xrays of the knee today.  Alysia Penna, MD

## 2022-09-13 ENCOUNTER — Encounter: Payer: Self-pay | Admitting: Family Medicine

## 2022-09-13 ENCOUNTER — Telehealth (HOSPITAL_COMMUNITY): Payer: Self-pay | Admitting: Family Medicine

## 2022-09-13 ENCOUNTER — Encounter (HOSPITAL_COMMUNITY): Payer: Medicare Other

## 2022-09-16 ENCOUNTER — Encounter (HOSPITAL_COMMUNITY): Payer: Medicare Other

## 2022-09-18 ENCOUNTER — Telehealth: Payer: Self-pay | Admitting: Pharmacist

## 2022-09-18 ENCOUNTER — Encounter (HOSPITAL_COMMUNITY): Payer: Medicare Other

## 2022-09-18 NOTE — Chronic Care Management (AMB) (Signed)
    Chronic Care Management Pharmacy Assistant   Name: Ricky Meyers  MRN: [id number] DOB: [birth year]  Reason for Encounter: Patient assistance application for Eliquis.  Application completed, to be mailed to patient 09/19/22.   [facility] Pharmacist Assistant [phone]

## 2022-09-18 NOTE — Telephone Encounter (Signed)
Results have been discussed with patient, see xray results.

## 2022-09-20 ENCOUNTER — Encounter (HOSPITAL_COMMUNITY): Payer: Medicare Other

## 2022-09-23 ENCOUNTER — Encounter (HOSPITAL_COMMUNITY)
Admission: RE | Admit: 2022-09-23 | Discharge: 2022-09-23 | Disposition: A | Discharge status: Home / Self Care | Payer: Medicare Other | Source: Ambulatory Visit | Attending: Interventional Cardiology | Admitting: Interventional Cardiology

## 2022-09-23 DIAGNOSIS — Z9889 Other specified postprocedural states: Secondary | ICD-10-CM

## 2022-09-23 NOTE — Progress Notes (Signed)
Patient returned to exercise today. Exercised without difficulty. Wore knee brace on left knee.Barnet Pall, RN,BSN 09/23/2022 11:00 AM

## 2022-09-25 ENCOUNTER — Encounter (HOSPITAL_COMMUNITY)
Admission: RE | Admit: 2022-09-25 | Discharge: 2022-09-25 | Disposition: A | Discharge status: Home / Self Care | Payer: Medicare Other | Source: Ambulatory Visit | Attending: Interventional Cardiology | Admitting: Interventional Cardiology

## 2022-09-25 DIAGNOSIS — Z9889 Other specified postprocedural states: Secondary | ICD-10-CM | POA: Diagnosis not present

## 2022-09-25 DIAGNOSIS — Z5189 Encounter for other specified aftercare: Secondary | ICD-10-CM | POA: Diagnosis not present

## 2022-09-27 ENCOUNTER — Encounter (HOSPITAL_COMMUNITY): Payer: Medicare Other

## 2022-09-30 ENCOUNTER — Encounter (HOSPITAL_COMMUNITY)
Admission: RE | Admit: 2022-09-30 | Discharge: 2022-09-30 | Disposition: A | Discharge status: Home / Self Care | Payer: Medicare Other | Source: Ambulatory Visit | Attending: Interventional Cardiology | Admitting: Interventional Cardiology

## 2022-09-30 DIAGNOSIS — Z9889 Other specified postprocedural states: Secondary | ICD-10-CM

## 2022-09-30 DIAGNOSIS — Z5189 Encounter for other specified aftercare: Secondary | ICD-10-CM | POA: Diagnosis not present

## 2022-10-02 ENCOUNTER — Encounter (HOSPITAL_COMMUNITY): Admission: RE | Admit: 2022-10-02 | Payer: Medicare Other | Source: Ambulatory Visit

## 2022-10-04 ENCOUNTER — Encounter (HOSPITAL_COMMUNITY)
Admission: RE | Admit: 2022-10-04 | Discharge: 2022-10-04 | Disposition: A | Discharge status: Home / Self Care | Payer: Medicare Other | Source: Ambulatory Visit | Attending: Interventional Cardiology | Admitting: Interventional Cardiology

## 2022-10-04 DIAGNOSIS — Z5189 Encounter for other specified aftercare: Secondary | ICD-10-CM | POA: Insufficient documentation

## 2022-10-04 DIAGNOSIS — Z9889 Other specified postprocedural states: Secondary | ICD-10-CM | POA: Insufficient documentation

## 2022-10-04 NOTE — Progress Notes (Signed)
Reviewed home exercise Rx with patient today.  Encouraged warm-up, cool-down, and stretching. Reviewed THRR of 57-114 and keeping RPE between 11-13. Encouraged to hydrate with activity.  Reviewed weather parameters for temperature and humidity for safe exercise outdoors. Reviewed S/S to terminate exercise and when to call 911 vs MD. Pt encouraged to always carry a cell phone for safety when exercising. Pt verbalized understanding of the home exercise Rx and was provided a copy.  Colbert Ewing, MS 10/04/2022 2:36 PM

## 2022-10-07 ENCOUNTER — Other Ambulatory Visit: Payer: Self-pay | Admitting: Family Medicine

## 2022-10-07 ENCOUNTER — Encounter (HOSPITAL_COMMUNITY)
Admission: RE | Admit: 2022-10-07 | Discharge: 2022-10-07 | Disposition: A | Discharge status: Home / Self Care | Payer: Medicare Other | Source: Ambulatory Visit | Attending: Interventional Cardiology | Admitting: Interventional Cardiology

## 2022-10-07 DIAGNOSIS — Z5189 Encounter for other specified aftercare: Secondary | ICD-10-CM | POA: Diagnosis not present

## 2022-10-07 DIAGNOSIS — Z9889 Other specified postprocedural states: Secondary | ICD-10-CM

## 2022-10-08 NOTE — Progress Notes (Signed)
Cardiac Individual Treatment Plan  Patient Details  Name: OLAJUWON FOSDICK MRN: [id number] Date of Birth: November 18, 1945 Referring Provider:   Flowsheet Row INTENSIVE CARDIAC REHAB ORIENT from 08/27/2022 in [facility] [place]  Referring Provider Larae Grooms, MD       Initial Encounter Date:  [place] from 08/27/2022 in [facility]  Date 08/27/22       Visit Diagnosis: 01/24/22 S/P Mitraclip, Mitral valve repair  Patient's Home Medications on Admission:  Current Outpatient Medications:    allopurinol (ZYLOPRIM) 300 MG tablet, TAKE 1 TABLET BY MOUTH EVERY DAY, Disp: 90 tablet, Rfl: 0   amoxicillin (AMOXIL) 500 MG capsule, Take 4 capsules (2,000 mg total) by mouth as directed. Take (4) capsules 1 hour before dental work, Disp: 4 capsule, Rfl: 3   apixaban (ELIQUIS) 2.5 MG TABS tablet, Take 1 tablet (2.5 mg total) by mouth 2 (two) times daily., Disp: 30 tablet, Rfl: 0   atorvastatin (LIPITOR) 40 MG tablet, TAKE 1 TABLET BY MOUTH EVERY DAY, Disp: 90 tablet, Rfl: 0   cyclobenzaprine (FLEXERIL) 10 MG tablet, Take 1 tablet (10 mg total) by mouth 3 (three) times daily as needed for muscle spasms. (Patient taking differently: Take 10 mg by mouth See admin instructions. Take 1 tablet (10 mg) by mouth scheduled in the morning & take 1 tablet (10 mg) by mouth scheduled in the evening, may take an additional dose in in the afternoon if needed for spasms/pain.), Disp: 90 tablet, Rfl: 5   famotidine (PEPCID) 20 MG tablet, Take 1 tablet (20 mg total) by mouth daily after supper., Disp: 90 tablet, Rfl: 3   gabapentin (NEURONTIN) 100 MG capsule, Take 1 capsule (100 mg total) by mouth 2 (two) times daily., Disp: 360 capsule, Rfl: 3   Melatonin 12 MG TABS, Take 12 mg by mouth at bedtime., Disp: , Rfl:    METAMUCIL FIBER PO, Take 1 Dose by mouth See admin  instructions. Take 1 dose by mouth (scheduled) daily, may take a second dose in the afternoon as needed for constipation, Disp: , Rfl:    oxyCODONE-acetaminophen (PERCOCET) 10-325 MG tablet, Take 1 tablet by mouth every 6 (six) hours as needed for pain., Disp: 120 tablet, Rfl: 0   PARoxetine (PAXIL) 20 MG tablet, Take 1 tablet (20 mg total) by mouth every morning., Disp: 90 tablet, Rfl: 3   telmisartan-hydrochlorothiazide (MICARDIS HCT) 80-25 MG tablet, Take 1 tablet by mouth daily., Disp: 90 tablet, Rfl: 3  Past Medical History: Past Medical History:  Diagnosis Date   Arthritis    neck and back    Chronic neck pain    Depression    ED (erectile dysfunction)    GERD (gastroesophageal reflux disease)    dysphagia   Gout    sees Dr. Leigh Aurora    Hyperlipidemia    Hypertension    OSA (obstructive sleep apnea) 12/18/2017   Severe with AHI at 70.1/hr and is now on CPAP at 11cm H2o   Prostate cancer [facility])    history of   Pulmonary embolism ([place]) 06/2019   S/P mitral valve clip implantation 01/24/2022   s/p Mitraclip XTW + NTW with Dr. Burt Knack and Dr. Ali Lowe   Sleep apnea    snoring/never checked    Tobacco Use: Social History   Tobacco Use  Smoking Status Never  Smokeless Tobacco Never    Labs: Review Flowsheet  More data exists      Latest Ref Rng & Units 08/22/2020 08/23/2021 12/20/2021 01/22/2022 08/26/2022  Labs for ITP Cardiac and Pulmonary Rehab  Cholestrol 0 - 200 mg/dL 180  153  - - 160   LDL (calc) 0 - 99 mg/dL 88  71  - - 71   HDL-C >39.00 mg/dL 73  65.20  - - 66.00   Trlycerides 0.0 - 149.0 mg/dL 96  84.0  - - 118.0   Hemoglobin A1c 4.6 - 6.5 % - 5.6  - - 5.6   PH, Arterial 7.35 - 7.45 - - 7.394  7.45  -  PCO2 arterial 32 - 48 mmHg - - 40.8  38  -  Bicarbonate 20.0 - 28.0 mmol/L - - 26.8  26.9  25.0  26.4  -  TCO2 22 - 32 mmol/L - - _0 - -  O2 Saturation % - - 67  71  95  98.5  -    Capillary Blood Glucose: Lab Results  Component Value Date    GLUCAP 127 (H) 12/02/2014     Exercise Target Goals: Exercise Program Goal: Individual exercise prescription set using results from initial 6 min walk test and THRR while considering  patient's activity barriers and safety.   Exercise Prescription Goal: Initial exercise prescription builds to 30-45 minutes a day of aerobic activity, 2-3 days per week.  Home exercise guidelines will be given to patient during program as part of exercise prescription that the participant will acknowledge.  Activity Barriers & Risk Stratification:  Activity Barriers & Cardiac Risk Stratification - 08/27/22 0942       Activity Barriers & Cardiac Risk Stratification   Activity Barriers Shortness of Breath;Muscular Weakness;Deconditioning;Joint Problems;Neck/Spine Problems;Left Hip Replacement;Back Problems;Decreased Ventricular Function    Cardiac Risk Stratification High             6 Minute Walk:  6 Minute Walk     Row Name 08/27/22 0848         6 Minute Walk   Phase Initial     Distance 1048 feet     Walk Time 6 minutes     # of Rest Breaks 1  break due to being over THR     MPH 1.98     METS 2     RPE 12     Perceived Dyspnea  3     VO2 Peak 6.96     Symptoms Yes (comment)     Comments SOB, RPD = 3     Resting HR 92 bpm     Resting BP 134/86     Resting Oxygen Saturation  95 %     Exercise Oxygen Saturation  during 6 min walk 94 %     Max Ex. HR 125 bpm     Max Ex. BP 150/90     2 Minute Post BP 142/86              Oxygen Initial Assessment:   Oxygen Re-Evaluation:   Oxygen Discharge (Final Oxygen Re-Evaluation):   Initial Exercise Prescription:  Initial Exercise Prescription - 08/27/22 0900       Date of Initial Exercise RX and Referring Provider   Date 08/27/22    Referring Provider Larae Grooms, MD    Expected Discharge Date 11/01/22      NuStep   Level 1    SPM 75    Minutes 25    METs 2  Prescription Details   Frequency (times per  week) 3    Duration Progress to 30 minutes of continuous aerobic without signs/symptoms of physical distress      Intensity   THRR 40-80% of Max Heartrate 57-114    Ratings of Perceived Exertion 11-13    Perceived Dyspnea 0-4      Progression   Progression Continue progressive overload as per policy without signs/symptoms or physical distress.      Resistance Training   Training Prescription Yes    Weight 3 lbs    Reps 10-15             Perform Capillary Blood Glucose checks as needed.  Exercise Prescription Changes:   Exercise Prescription Changes     Row Name 09/09/22 1000 10/04/22 1400           Response to Exercise   Blood Pressure (Admit) 116/70 112/70      Blood Pressure (Exercise) 130/80 124/72      Blood Pressure (Exit) 114/78 110/68      Heart Rate (Admit) 89 bpm 83 bpm      Heart Rate (Exercise) 108 bpm 96 bpm      Heart Rate (Exit) 98 bpm 89 bpm      Rating of Perceived Exertion (Exercise) 12 11      Symptoms None none      Comments Pt's first day in the [facility] program reviewed mets, goals, home exercise with pt      Duration Continue with 30 min of aerobic exercise without signs/symptoms of physical distress. Continue with 30 min of aerobic exercise without signs/symptoms of physical distress.      Intensity THRR unchanged THRR unchanged        Progression   Progression Continue to progress workloads to maintain intensity without signs/symptoms of physical distress. Continue to progress workloads to maintain intensity without signs/symptoms of physical distress.      Average METs 3.4 2.5        Resistance Training   Training Prescription Yes Yes      Weight 3 lbs 3 lbs      Reps 10-15 10-15      Time 10 Minutes 10 Minutes        Interval Training   Interval Training No No        NuStep   Level 1 2      SPM 99 85      Minutes 30 30      METs 3.4 2.5        Home Exercise Plan   Plans to continue exercise at -- [employer] (comment)   [employer]      Frequency -- Add 2 additional days to program exercise sessions.      Initial Home Exercises Provided -- 10/04/22               Exercise Comments:   Exercise Comments     Row Name 09/09/22 1059 10/04/22 1431         Exercise Comments Pt's first day in the [facility] program. Pt exercised with no complaints, Reviewed METs, goals, home ExRx with pt. Pt tolerated exercise well with avg MET level of 2.5. Pt feels he is not progressing towards goals of increase strength, stamina, endurance due to set back with recent fall. Pt voices that he is focusing on diet for weight loss goals, and feels he is making progress. Pt will exercise on his own by going to [employer] 2x/week  to use Nustep for 92mn and machine weights. Pt is excited for home exercise and feels it will help him get on track to meet his goals. Pt verbalized understanding of the home exercise Rx and was provided a copy.               Exercise Goals and Review:   Exercise Goals     Row Name 08/27/22 0943             Exercise Goals   Increase Physical Activity Yes       Intervention Provide advice, education, support and counseling about physical activity/exercise needs.;Develop an individualized exercise prescription for aerobic and resistive training based on initial evaluation findings, risk stratification, comorbidities and participant's personal goals.       Expected Outcomes Short Term: Attend rehab on a regular basis to increase amount of physical activity.;Long Term: Add in home exercise to make exercise part of routine and to increase amount of physical activity.;Long Term: Exercising regularly at least 3-5 days a week.       Increase Strength and Stamina Yes       Intervention Provide advice, education, support and counseling about physical activity/exercise needs.;Develop an individualized exercise prescription for aerobic and resistive training based on initial evaluation findings, risk  stratification, comorbidities and participant's personal goals.       Expected Outcomes Long Term: Improve cardiorespiratory fitness, muscular endurance and strength as measured by increased METs and functional capacity (6MWT);Short Term: Perform resistance training exercises routinely during rehab and add in resistance training at home;Short Term: Increase workloads from initial exercise prescription for resistance, speed, and METs.       Able to understand and use rate of perceived exertion (RPE) scale Yes       Intervention Provide education and explanation on how to use RPE scale       Expected Outcomes Short Term: Able to use RPE daily in rehab to express subjective intensity level;Long Term:  Able to use RPE to guide intensity level when exercising independently       Knowledge and understanding of Target Heart Rate Range (THRR) Yes       Intervention Provide education and explanation of THRR including how the numbers were predicted and where they are located for reference       Expected Outcomes Short Term: Able to state/look up THRR;Short Term: Able to use daily as guideline for intensity in rehab;Long Term: Able to use THRR to govern intensity when exercising independently       Understanding of Exercise Prescription Yes       Intervention Provide education, explanation, and written materials on patient's individual exercise prescription       Expected Outcomes Short Term: Able to explain program exercise prescription;Long Term: Able to explain home exercise prescription to exercise independently                Exercise Goals Re-Evaluation :  Exercise Goals Re-Evaluation     Row Name 09/09/22 1058 10/04/22 1417           Exercise Goal Re-Evaluation   Exercise Goals Review Increase Physical Activity;Increase Strength and Stamina;Able to understand and use rate of perceived exertion (RPE) scale;Knowledge and understanding of Target Heart Rate Range (THRR);Understanding of Exercise  Prescription Increase Physical Activity;Increase Strength and Stamina;Able to understand and use rate of perceived exertion (RPE) scale;Knowledge and understanding of Target Heart Rate Range (THRR);Understanding of Exercise Prescription;Able to check pulse independently      Comments  Pt's first day in the [facility] program. Pt understands the exercise RX, RPE scale and THRR, Reviewed METs, goals, home ExRx with pt. Pt tolerated exercise well with avg MET level of 2.5. Pt feels he is not progressing towards goals of increase strength, stamina, endurance due to set back with recent fall. Pt voices that he is focusing on diet for weight loss goals, and feels he is making progress. Pt will exercise on his own by going to [employer] 2x/week to use Nustep for 69mn and machine weights. Pt is excited for home exercise and feels it will help him get on track to meet his goals. Pt is progressing well in program.      Expected Outcomes Will continue to montior patient and progress exercise workloads as tolerated. Will continue to montior patient and progress exercise workloads as tolerated.               Discharge Exercise Prescription (Final Exercise Prescription Changes):  Exercise Prescription Changes - 10/04/22 1400       Response to Exercise   Blood Pressure (Admit) 112/70    Blood Pressure (Exercise) 124/72    Blood Pressure (Exit) 110/68    Heart Rate (Admit) 83 bpm    Heart Rate (Exercise) 96 bpm    Heart Rate (Exit) 89 bpm    Rating of Perceived Exertion (Exercise) 11    Symptoms none    Comments reviewed mets, goals, home exercise with pt    Duration Continue with 30 min of aerobic exercise without signs/symptoms of physical distress.    Intensity THRR unchanged      Progression   Progression Continue to progress workloads to maintain intensity without signs/symptoms of physical distress.    Average METs 2.5      Resistance Training   Training Prescription Yes    Weight 3 lbs    Reps  10-15    Time 10 Minutes      Interval Training   Interval Training No      NuStep   Level 2    SPM 85    Minutes 30    METs 2.5      Home Exercise Plan   [facility] to [facility] [employer] [employer]   [employer]   Frequency Add 2 additional days to program exercise sessions.    Initial Home Exercises Provided 10/04/22             Nutrition:  Target Goals: Understanding of nutrition guidelines, daily intake of sodium <15022m cholesterol <200104mcalories 30% from fat and 7% or less from saturated fats, daily to have 5 or more servings of fruits and vegetables.  Biometrics:  Pre Biometrics - 08/27/22 0800       Pre Biometrics   Waist Circumference 46.25 inches    Hip Circumference 47.5 inches    Waist to Hip Ratio 0.97 %    Triceps Skinfold 13 mm    % Body Fat 32.9 %    Grip Strength 33 kg    Flexibility --   Not done, recent hip replacement   Single Leg Stand 29 seconds              Nutrition Therapy Plan and Nutrition Goals:  Nutrition Therapy & Goals - 10/04/22 1028       Nutrition Therapy   Diet Heart Healthy Diet    Drug/Food Interactions Statins/Certain Fruits      Personal Nutrition Goals   Nutrition Goal Patient to identify strategies  for managing cardiovascular risk by attending the weekly [facility] education and nutrition courses    Personal Goal #2 Patient to identify food sources and limit daily intake of saturated fat, trans fat, sodium, and refined carbohydrates    Personal Goal #3 Patient to limit sodium to <1569m per day.    Comments Goals in action. GTreyvoneremains very motivated to make lifestyle and dietary changes to aid with heart health and weight loss. He continues to attend the [facility] education and nutrition series. He has started making many changes including increased high fiber foods including vegetables and mindfulness of saturated fat intake and portion sizes. He continues to work on reading food labels for  sodium and sugar. He is down 5.5# since starting with our program (1.1#/week of weight loss). His wife is very supportive of lifestyle changes.      Intervention Plan   Intervention Prescribe, educate and counsel regarding individualized specific dietary modifications aiming towards targeted core components such as weight, hypertension, lipid management, diabetes, heart failure and other comorbidities.;Nutrition handout(s) given to patient.    Expected Outcomes Short Term Goal: Understand basic principles of dietary content, such as calories, fat, sodium, cholesterol and nutrients.;Long Term Goal: Adherence to prescribed nutrition plan.             Nutrition Assessments:  MEDIFICTS Score Key: ?70 Need to make dietary changes  40-70 Heart Healthy Diet ? 40 Therapeutic Level Cholesterol Diet    Picture Your Plate Scores: <<46Unhealthy dietary pattern with much room for improvement. 41-50 Dietary pattern unlikely to meet recommendations for good health and room for improvement. 51-60 More healthful dietary pattern, with some room for improvement.  >60 Healthy dietary pattern, although there may be some specific behaviors that could be improved.    Nutrition Goals Re-Evaluation:  Nutrition Goals Re-Evaluation     Row Name 09/09/22 0943 10/04/22 1028           Goals   Current Weight 240 lb 8.4 oz (109.1 kg) 240 lb 1.3 oz (108.9 kg)      Comment lipids WNL, A1c WNL A1c WNL, Lipids WNL, down 5.5# since orientation date      Expected Outcome GAlioulives at home with his significant other, NIzora Gala She is supportive of making dietary changes and she doe sthe majorty of the grocery shopping and cooking. GZayvienreports weight gain of >20# over the last year related to surgery and sedentary habits. He is motivated to lose ~30#. Goals in action. GReymondremains very motivated to make lifestyle and dietary changes to aid with heart health and weight loss. He continues to attend the [facility]  education and nutrition series. He has started making many changes including increased high fiber foods including vegetables and mindfulness of saturated fat intake and portion sizes. He continues to work on reading food labels for sodium and sugar. He is down 5.5# since starting with our program. His wife is very supportive of lifestyle changes.               Nutrition Goals Re-Evaluation:  Nutrition Goals Re-Evaluation     Row Name 09/09/22 0943 10/04/22 1028           Goals   Current Weight 240 lb 8.4 oz (109.1 kg) 240 lb 1.3 oz (108.9 kg)      Comment lipids WNL, A1c WNL A1c WNL, Lipids WNL, down 5.5# since orientation date      Expected Outcome GJemarlives at home with his significant other, NIzora Gala  She is supportive of making dietary changes and she doe sthe majorty of the grocery shopping and cooking. Beulah reports weight gain of >20# over the last year related to surgery and sedentary habits. He is motivated to lose ~30#. Goals in action. Kairee remains very motivated to make lifestyle and dietary changes to aid with heart health and weight loss. He continues to attend the [facility] education and nutrition series. He has started making many changes including increased high fiber foods including vegetables and mindfulness of saturated fat intake and portion sizes. He continues to work on reading food labels for sodium and sugar. He is down 5.5# since starting with our program. His wife is very supportive of lifestyle changes.               Nutrition Goals Discharge (Final Nutrition Goals Re-Evaluation):  Nutrition Goals Re-Evaluation - 10/04/22 1028       Goals   Current Weight 240 lb 1.3 oz (108.9 kg)    Comment A1c WNL, Lipids WNL, down 5.5# since orientation date    Expected Outcome Goals in action. Lillie remains very motivated to make lifestyle and dietary changes to aid with heart health and weight loss. He continues to attend the [facility] education and nutrition  series. He has started making many changes including increased high fiber foods including vegetables and mindfulness of saturated fat intake and portion sizes. He continues to work on reading food labels for sodium and sugar. He is down 5.5# since starting with our program. His wife is very supportive of lifestyle changes.             Psychosocial: Target Goals: Acknowledge presence or absence of significant depression and/or stress, maximize coping skills, provide positive support system. Participant is able to verbalize types and ability to use techniques and skills needed for reducing stress and depression.  Initial Review & Psychosocial Screening:  Initial Psych Review & Screening - 08/27/22 0841       Initial Review   Current issues with History of Depression      Family Dynamics   Good Support System? Yes   Griffith has his domestic partner [place] for support. Gustavus Messing also has two daughters     Barriers   Psychosocial barriers to participate in program The patient should benefit from training in stress management and relaxation.      Screening Interventions   Interventions Encouraged to exercise             Quality of Life Scores:  Quality of Life - 08/27/22 0937       Quality of Life   Select Quality of Life      Quality of Life Scores   Health/Function Pre 21.87 %    Socioeconomic Pre 24.83 %    Psych/Spiritual Pre 23.57 %    Family Pre 24.1 %    GLOBAL Pre 23.11 %            Scores of 19 and below usually indicate a poorer quality of life in these areas.  A difference of  2-3 points is a clinically meaningful difference.  A difference of 2-3 points in the total score of the Quality of Life Index has been associated with significant improvement in overall quality of life, self-image, physical symptoms, and general health in studies assessing change in quality of life.  PHQ-9: Review Flowsheet  More data exists      09/12/2022 08/27/2022 08/26/2022 02/15/2022  11/12/2021  Depression screen PHQ 2/9  Decreased  Interest 0 0 0 0 0  Down, Depressed, Hopeless 0 0 0 0 0  PHQ - 2 Score 0 0 0 0 0  Altered sleeping 0 - 0 0 -  Tired, decreased energy 0 - 2 2 -  Change in appetite 0 - 0 0 -  Feeling bad or failure about yourself  0 - 0 0 -  Trouble concentrating 0 - 0 0 -  Moving slowly or fidgety/restless 0 - 0 0 -  Suicidal thoughts 0 - 0 0 -  PHQ-9 Score 0 - 2 2 -  Difficult doing work/chores Not difficult at all - Not difficult at all Not difficult at all -   Interpretation of Total Score  Total Score Depression Severity:  1-4 = Minimal depression, 5-9 = Mild depression, 10-14 = Moderate depression, 15-19 = Moderately severe depression, 20-27 = Severe depression   Psychosocial Evaluation and Intervention:   Psychosocial Re-Evaluation:  Psychosocial Re-Evaluation     Row Name 09/10/22 1532 10/04/22 1015           Psychosocial Re-Evaluation   Current issues with History of Depression History of Depression      Comments Quintin did not voice any concerns or stressors on his first day of exercise Arvon has not voiced any  increased concerns or stressors during  exercise at intensive cardiac rehab.      Expected Outcomes Donyel will have controled or decreased depression upon completion of intensive cardiac rehab Spyridon will have controled or decreased depression upon completion of intensive cardiac rehab      Interventions Stress management education;Encouraged to attend Cardiac Rehabilitation for the exercise Stress management education;Encouraged to attend Cardiac Rehabilitation for the exercise      Continue Psychosocial Services  No Follow up required No Follow up required               Psychosocial Discharge (Final Psychosocial Re-Evaluation):  Psychosocial Re-Evaluation - 10/04/22 1015       Psychosocial Re-Evaluation   Current issues with History of Depression    Comments Fields has not voiced any  increased concerns or stressors  during  exercise at intensive cardiac rehab.    Expected Outcomes Damareon will have controled or decreased depression upon completion of intensive cardiac rehab    Interventions Stress management education;Encouraged to attend Cardiac Rehabilitation for the exercise    Continue Psychosocial Services  No Follow up required             Vocational Rehabilitation: Provide vocational rehab assistance to qualifying candidates.   Vocational Rehab Evaluation & Intervention:  Vocational Rehab - 08/27/22 0842       Initial Vocational Rehab Evaluation & Intervention   Assessment shows need for Vocational Rehabilitation No   Gustavus Messing is retired and does not need vocational rehab a this time            Education: Education Goals: Education classes will be provided on a weekly basis, covering required topics. Participant will state understanding/return demonstration of topics presented.    Education     Row Name 09/09/22 0900     Education   Cardiac Education Topics [facility]   Select Workshops     Workshops   Educator Dietitian   Select Nutrition   Nutrition Workshop Label Reading   Instruction Review Code 1- Verbalizes Understanding   Class Start Time 0813   Class Stop Time 9179   Class Time Calculation (min) 51 min    [place] Name 09/23/22 1100  Education   Cardiac Education Topics [place]   Environmental consultant Exercise   Exercise Workshop Exercise Basics: Press photographer   Instruction Review Code 1- Verbalizes Understanding   Class Start Time [phone]   Class Stop Time 3016   Class Time Calculation (min) 45 min    [place] Name 09/25/22 1000     Education   Cardiac Education Topics [employer]   Educator Dietitian   Weekly Topic Efficiency Cooking - Meals in a Snap  Stuffed Butternut Squash   Instruction Review Code 1- Verbalizes Understanding   Class Start Time 0815    Class Stop Time 0856   Class Time Calculation (min) 41 min    [place] Name 09/30/22 0900     Education   Cardiac Education Topics Pritikin   Select Core Videos     Core Videos   Educator Dietitian   Select Nutrition   Nutrition Nutrition Action Plan   Instruction Review Code 1- Verbalizes Understanding   Class Start Time 0810   Class Stop Time 0853   Class Time Calculation (min) 43 min    [place] Name 10/04/22 0900     Education   Cardiac Education Topics Pritikin   Select Core Videos     Core Videos   Educator Nurse   Select General Education   General Education Hypertension and Heart Disease   Instruction Review Code 1- Verbalizes Understanding   Class Start Time 0815   Class Stop Time 0900   Class Time Calculation (min) 45 min    [place] Name 10/07/22 0900     Education   Cardiac Education Topics Pritikin   Select Workshops     Workshops   Educator Exercise Physiologist   Select Psychosocial   Psychosocial Workshop Other  Head to heart   Instruction Review Code 1- Verbalizes Understanding   Class Start Time [phone]   Class Stop Time 0908   Class Time Calculation (min) 56 min            Core Videos: Exercise    Move It!  Clinical staff conducted group or individual video education with verbal and written material and guidebook.  Patient learns the recommended Pritikin exercise program. Exercise with the goal of living a long, healthy life. Some of the health benefits of exercise include controlled diabetes, healthier blood pressure levels, improved cholesterol levels, improved heart and lung capacity, improved sleep, and better body composition. Everyone should speak with their doctor before starting or changing an exercise routine.  Biomechanical Limitations Clinical staff conducted group or individual video education with verbal and written material and guidebook.  Patient learns how biomechanical limitations can impact exercise and how we can mitigate and  possibly overcome limitations to have an impactful and balanced exercise routine.  Body Composition Clinical staff conducted group or individual video education with verbal and written material and guidebook.  Patient learns that body composition (ratio of muscle mass to fat mass) is a key component to assessing overall fitness, rather than body weight alone. Increased fat mass, especially visceral belly fat, can put [place] at increased risk for metabolic syndrome, type 2 diabetes, heart disease, and even death. It is recommended to combine diet and exercise (cardiovascular and resistance training) to improve your body composition. Seek guidance from your physician and exercise physiologist before implementing an exercise routine.  Exercise Action Plan Clinical  staff conducted group or individual video education with verbal and written material and guidebook.  Patient learns the recommended strategies to achieve and enjoy long-term exercise adherence, including variety, self-motivation, self-efficacy, and positive decision making. Benefits of exercise include fitness, good health, weight management, more energy, better sleep, less stress, and overall well-being.  Medical   Heart Disease Risk Reduction Clinical staff conducted group or individual video education with verbal and written material and guidebook.  Patient learns our heart is our most vital organ as it circulates oxygen, nutrients, white blood cells, and hormones throughout the entire body, and carries waste away. Data supports a plant-based eating plan like the Pritikin Program for its effectiveness in slowing progression of and reversing heart disease. The video provides a number of recommendations to address heart disease.   Metabolic Syndrome and Belly Fat  Clinical staff conducted group or individual video education with verbal and written material and guidebook.  Patient learns what metabolic syndrome is, how it leads to heart disease,  and how one can reverse it and keep it from coming back. You have metabolic syndrome if you have 3 of the following 5 criteria: abdominal obesity, high blood pressure, high triglycerides, low HDL cholesterol, and high blood sugar.  Hypertension and Heart Disease Clinical staff conducted group or individual video education with verbal and written material and guidebook.  Patient learns that high blood pressure, or hypertension, is very common in the [place]. Hypertension is largely due to excessive salt intake, but other important risk factors include being overweight, physical inactivity, drinking too much alcohol, smoking, and not eating enough potassium from fruits and vegetables. High blood pressure is a leading risk factor for heart attack, stroke, congestive heart failure, dementia, kidney failure, and premature death. Long-term effects of excessive salt intake include stiffening of the arteries and thickening of heart muscle and organ damage. Recommendations include ways to reduce hypertension and the risk of heart disease.  Diseases of Our Time - Focusing on Diabetes Clinical staff conducted group or individual video education with verbal and written material and guidebook.  Patient learns why the best way to stop diseases of our time is prevention, through food and other lifestyle changes. Medicine (such as prescription pills and surgeries) is often only a Band-Aid on the problem, not a long-term solution. Most common diseases of our time include obesity, type 2 diabetes, hypertension, heart disease, and cancer. The Pritikin Program is recommended and has been proven to help reduce, reverse, and/or prevent the damaging effects of metabolic syndrome.  Nutrition   Overview of the Pritikin Eating Plan  Clinical staff conducted group or individual video education with verbal and written material and guidebook.  Patient learns about the [place] for disease risk reduction. The  [place] emphasizes a wide variety of unrefined, minimally-processed carbohydrates, like fruits, vegetables, whole grains, and legumes. Go, Caution, and Stop food choices are explained. Plant-based and lean animal proteins are emphasized. Rationale provided for low sodium intake for blood pressure control, low added sugars for blood sugar stabilization, and low added fats and oils for coronary artery disease risk reduction and weight management.  Calorie Density  Clinical staff conducted group or individual video education with verbal and written material and guidebook.  Patient learns about calorie density and how it impacts the Pritikin Eating Plan. Knowing the characteristics of the food you choose will help you decide whether those foods will lead to weight gain or weight loss, and whether you want to consume  more or less of them. Weight loss is usually a side effect of the Pritikin Eating Plan because of its focus on low calorie-dense foods.  Label Reading  Clinical staff conducted group or individual video education with verbal and written material and guidebook.  Patient learns about the Pritikin recommended label reading guidelines and corresponding recommendations regarding calorie density, added sugars, sodium content, and whole grains.  Dining Out - Part 1  Clinical staff conducted group or individual video education with verbal and written material and guidebook.  Patient learns that restaurant meals can be sabotaging because they can be so high in calories, fat, sodium, and/or sugar. Patient learns recommended strategies on how to positively address this and avoid unhealthy pitfalls.  Facts on Fats  Clinical staff conducted group or individual video education with verbal and written material and guidebook.  Patient learns that lifestyle modifications can be just as effective, if not more so, as many medications for lowering your risk of heart disease. A Pritikin lifestyle can  help to reduce your risk of inflammation and atherosclerosis (cholesterol build-up, or plaque, in the artery walls). Lifestyle interventions such as dietary choices and physical activity address the cause of atherosclerosis. A review of the types of fats and their impact on blood cholesterol levels, along with dietary recommendations to reduce fat intake is also included.  Nutrition Action Plan  Clinical staff conducted group or individual video education with verbal and written material and guidebook.  Patient learns how to incorporate Pritikin recommendations into their lifestyle. Recommendations include planning and keeping personal health goals in mind as an important part of their success.  Healthy Mind-Set    Healthy Minds, Bodies, Hearts  Clinical staff conducted group or individual video education with verbal and written material and guidebook.  Patient learns how to identify when they are stressed. Video will discuss the impact of that stress, as well as the many benefits of stress management. Patient will also be introduced to stress management techniques. The way we think, act, and feel has an impact on our hearts.  How Our Thoughts Can Heal Our Hearts  Clinical staff conducted group or individual video education with verbal and written material and guidebook.  Patient learns that negative thoughts can cause depression and anxiety. This can result in negative lifestyle behavior and serious health problems. Cognitive behavioral therapy is an effective method to help control our thoughts in order to change and improve our emotional outlook.  Additional Videos:  Exercise    Improving Performance  Clinical staff conducted group or individual video education with verbal and written material and guidebook.  Patient learns to use a non-linear approach by alternating intensity levels and lengths of time spent exercising to help burn more calories and lose more body fat. Cardiovascular exercise  helps improve heart health, metabolism, hormonal balance, blood sugar control, and recovery from fatigue. Resistance training improves strength, endurance, balance, coordination, reaction time, metabolism, and muscle mass. Flexibility exercise improves circulation, posture, and balance. Seek guidance from your physician and exercise physiologist before implementing an exercise routine and learn your capabilities and proper form for all exercise.  Introduction to Yoga  Clinical staff conducted group or individual video education with verbal and written material and guidebook.  Patient learns about yoga, a discipline of the coming together of mind, breath, and body. The benefits of yoga include improved flexibility, improved range of motion, better posture and core strength, increased lung function, weight loss, and positive self-image. Yoga's heart health benefits include lowered  blood pressure, healthier heart rate, decreased cholesterol and triglyceride levels, improved immune function, and reduced stress. Seek guidance from your physician and exercise physiologist before implementing an exercise routine and learn your capabilities and proper form for all exercise.  Medical   Aging: Enhancing Your Quality of Life  Clinical staff conducted group or individual video education with verbal and written material and guidebook.  Patient learns key strategies and recommendations to stay in good physical health and enhance quality of life, such as prevention strategies, having an advocate, securing a [place], and keeping a list of medications and system for tracking them. It also discusses how to avoid risk for bone loss.  Biology of Weight Control  Clinical staff conducted group or individual video education with verbal and written material and guidebook.  Patient learns that weight gain occurs because we consume more calories than we burn (eating more, moving less). Even if  your body weight is normal, you may have higher ratios of fat compared to muscle mass. Too much body fat puts you at increased risk for cardiovascular disease, heart attack, stroke, type 2 diabetes, and obesity-related cancers. In addition to exercise, following the [place] can help reduce your risk.  Decoding Lab Results  Clinical staff conducted group or individual video education with verbal and written material and guidebook.  Patient learns that lab test reflects one measurement whose values change over time and are influenced by many factors, including medication, stress, sleep, exercise, food, hydration, pre-existing medical conditions, and more. It is recommended to use the knowledge from this video to become more involved with your lab results and evaluate your numbers to speak with your doctor.   Diseases of Our Time - Overview  Clinical staff conducted group or individual video education with verbal and written material and guidebook.  Patient learns that according to the CDC, 50% to 70% of chronic diseases (such as obesity, type 2 diabetes, elevated lipids, hypertension, and heart disease) are avoidable through lifestyle improvements including healthier food choices, listening to satiety cues, and increased physical activity.  Sleep Disorders Clinical staff conducted group or individual video education with verbal and written material and guidebook.  Patient learns how good quality and duration of sleep are important to overall health and well-being. Patient also learns about sleep disorders and how they impact health along with recommendations to address them, including discussing with a physician.  Nutrition  Dining Out - Part 2 Clinical staff conducted group or individual video education with verbal and written material and guidebook.  Patient learns how to plan ahead and communicate in order to maximize their dining experience in a healthy and nutritious manner.  Included are recommended food choices based on the type of restaurant the patient is visiting.   Fueling a Best boy conducted group or individual video education with verbal and written material and guidebook.  There is a strong connection between our food choices and our health. Diseases like obesity and type 2 diabetes are very prevalent and are in large-part due to lifestyle choices. The Pritikin Eating Plan provides plenty of food and hunger-curbing satisfaction. It is easy to follow, affordable, and helps reduce health risks.  Menu Workshop  Clinical staff conducted group or individual video education with verbal and written material and guidebook.  Patient learns that restaurant meals can sabotage health goals because they are often packed with calories, fat, sodium, and sugar. Recommendations include strategies to plan ahead and  to communicate with the manager, chef, or server to help order a healthier meal.  Planning Your Eating Strategy  Clinical staff conducted group or individual video education with verbal and written material and guidebook.  Patient learns about the [place] and its benefit of reducing the risk of disease. The [place] does not focus on calories. Instead, it emphasizes high-quality, nutrient-rich foods. By knowing the characteristics of the foods, we choose, we can determine their calorie density and make informed decisions.  Targeting Your Nutrition Priorities  Clinical staff conducted group or individual video education with verbal and written material and guidebook.  Patient learns that lifestyle habits have a tremendous impact on disease risk and progression. This video provides eating and physical activity recommendations based on your personal health goals, such as reducing LDL cholesterol, losing weight, preventing or controlling type 2 diabetes, and reducing high blood pressure.  Vitamins and Minerals  Clinical staff  conducted group or individual video education with verbal and written material and guidebook.  Patient learns different ways to obtain key vitamins and minerals, including through a recommended healthy diet. It is important to discuss all supplements you take with your doctor.   Healthy Mind-Set    Smoking Cessation  Clinical staff conducted group or individual video education with verbal and written material and guidebook.  Patient learns that cigarette smoking and tobacco addiction pose a serious health risk which affects millions of people. Stopping smoking will significantly reduce the risk of heart disease, lung disease, and many forms of cancer. Recommended strategies for quitting are covered, including working with your doctor to develop a successful plan.  Culinary   Becoming a Financial trader conducted group or individual video education with verbal and written material and guidebook.  Patient learns that cooking at home can be healthy, cost-effective, quick, and puts them in control. Keys to cooking healthy recipes will include looking at your recipe, assessing your equipment needs, planning ahead, making it simple, choosing cost-effective seasonal ingredients, and limiting the use of added fats, salts, and sugars.  Cooking - Breakfast and Snacks  Clinical staff conducted group or individual video education with verbal and written material and guidebook.  Patient learns how important breakfast is to satiety and nutrition through the entire day. Recommendations include key foods to eat during breakfast to help stabilize blood sugar levels and to prevent overeating at meals later in the day. Planning ahead is also a key component.  Cooking - Human resources officer conducted group or individual video education with verbal and written material and guidebook.  Patient learns eating strategies to improve overall health, including an approach to cook more at home.  Recommendations include thinking of animal protein as a side on your plate rather than center stage and focusing instead on lower calorie dense options like vegetables, fruits, whole grains, and plant-based proteins, such as beans. Making sauces in large quantities to freeze for later and leaving the skin on your vegetables are also recommended to maximize your experience.  Cooking - Healthy Salads and Dressing Clinical staff conducted group or individual video education with verbal and written material and guidebook.  Patient learns that vegetables, fruits, whole grains, and legumes are the foundations of the [place]. Recommendations include how to incorporate each of these in flavorful and healthy salads, and how to create homemade salad dressings. Proper handling of ingredients is also covered. Cooking - Soups and Desserts  Cooking - Soups and Desserts  Clinical staff conducted group or individual video education with verbal and written material and guidebook.  Patient learns that Pritikin soups and desserts make for easy, nutritious, and delicious snacks and meal components that are low in sodium, fat, sugar, and calorie density, while high in vitamins, minerals, and filling fiber. Recommendations include simple and healthy ideas for soups and desserts.   Overview     The Pritikin Solution Program Overview Clinical staff conducted group or individual video education with verbal and written material and guidebook.  Patient learns that the results of the [place] Program have been documented in more than 100 articles published in peer-reviewed journals, and the benefits include reducing risk factors for (and, in some cases, even reversing) high cholesterol, high blood pressure, type 2 diabetes, obesity, and more! An overview of the three key pillars of the Pritikin Program will be covered: eating well, doing regular exercise, and having a healthy mind-set.  WORKSHOPS   Exercise: Exercise Basics: Building Your Action Plan Clinical staff led group instruction and group discussion with PowerPoint presentation and patient guidebook. To enhance the learning environment the use of posters, models and videos may be added. At the conclusion of this workshop, patients will comprehend the difference between physical activity and exercise, as well as the benefits of incorporating both, into their routine. Patients will understand the FITT (Frequency, Intensity, Time, and Type) principle and how to use it to build an exercise action plan. In addition, safety concerns and other considerations for exercise and cardiac rehab will be addressed by the presenter. The purpose of this lesson is to promote a comprehensive and effective weekly exercise routine in order to improve patients' overall level of fitness.   Managing Heart Disease: Your Path to a Healthier Heart Clinical staff led group instruction and group discussion with PowerPoint presentation and patient guidebook. To enhance the learning environment the use of posters, models and videos may be added.At the conclusion of this workshop, patients will understand the anatomy and physiology of the heart. Additionally, they will understand how Pritikin's three pillars impact the risk factors, the progression, and the management of heart disease.  The purpose of this lesson is to provide a high-level overview of the heart, heart disease, and how the Pritikin lifestyle positively impacts risk factors.  Exercise Biomechanics Clinical staff led group instruction and group discussion with PowerPoint presentation and patient guidebook. To enhance the learning environment the use of posters, models and videos may be added. Patients will learn how the structural parts of their bodies function and how these functions impact their daily activities, movement, and exercise. Patients will learn how to promote a neutral spine, learn how  to manage pain, and identify ways to improve their physical movement in order to promote healthy living. The purpose of this lesson is to expose patients to common physical limitations that impact physical activity. Participants will learn practical ways to adapt and manage aches and pains, and to minimize their effect on regular exercise. Patients will learn how to maintain good posture while sitting, walking, and lifting.  Balance Training and Fall Prevention  Clinical staff led group instruction and group discussion with PowerPoint presentation and patient guidebook. To enhance the learning environment the use of posters, models and videos may be added. At the conclusion of this workshop, patients will understand the importance of their sensorimotor skills (vision, proprioception, and the vestibular system) in maintaining their ability to balance as they age. Patients will apply a variety of balancing exercises that are  appropriate for their current level of function. Patients will understand the common causes for poor balance, possible solutions to these problems, and ways to modify their physical environment in order to minimize their fall risk. The purpose of this lesson is to teach patients about the importance of maintaining balance as they age and ways to minimize their risk of falling.  WORKSHOPS   Nutrition:  Fueling a Scientist, research (physical sciences) led group instruction and group discussion with PowerPoint presentation and patient guidebook. To enhance the learning environment the use of posters, models and videos may be added. Patients will review the foundational principles of the [place] and understand what constitutes a serving size in each of the food groups. Patients will also learn Pritikin-friendly foods that are better choices when away from home and review make-ahead meal and snack options. Calorie density will be reviewed and applied to three nutrition priorities:  weight maintenance, weight loss, and weight gain. The purpose of this lesson is to reinforce (in a group setting) the key concepts around what patients are recommended to eat and how to apply these guidelines when away from home by planning and selecting Pritikin-friendly options. Patients will understand how calorie density may be adjusted for different weight management goals.  Mindful Eating  Clinical staff led group instruction and group discussion with PowerPoint presentation and patient guidebook. To enhance the learning environment the use of posters, models and videos may be added. Patients will briefly review the concepts of the [place] and the importance of low-calorie dense foods. The concept of mindful eating will be introduced as well as the importance of paying attention to internal hunger signals. Triggers for non-hunger eating and techniques for dealing with triggers will be explored. The purpose of this lesson is to provide patients with the opportunity to review the basic principles of the [place], discuss the value of eating mindfully and how to measure internal cues of hunger and fullness using the Hunger Scale. Patients will also discuss reasons for non-hunger eating and learn strategies to use for controlling emotional eating.  Targeting Your Nutrition Priorities Clinical staff led group instruction and group discussion with PowerPoint presentation and patient guidebook. To enhance the learning environment the use of posters, models and videos may be added. Patients will learn how to determine their genetic susceptibility to disease by reviewing their family history. Patients will gain insight into the importance of diet as part of an overall healthy lifestyle in mitigating the impact of genetics and other environmental insults. The purpose of this lesson is to provide patients with the opportunity to assess their personal nutrition priorities by looking at  their family history, their own health history and current risk factors. Patients will also be able to discuss ways of prioritizing and modifying the [place] for their highest risk areas  Menu  Clinical staff led group instruction and group discussion with PowerPoint presentation and patient guidebook. To enhance the learning environment the use of posters, models and videos may be added. Using menus brought in from [employer], or printed from [employer], patients will apply the [place] dining out guidelines that were presented in the [employer] video. Patients will also be able to practice these guidelines in a variety of provided scenarios. The purpose of this lesson is to provide patients with the opportunity to practice hands-on learning of the [place] with actual menus and practice scenarios.  Label Reading Clinical staff led  group instruction and group discussion with PowerPoint presentation and patient guidebook. To enhance the learning environment the use of posters, models and videos may be added. Patients will review and discuss the [facility] label reading guidelines presented in [employer]'s Label Reading Educational series video. Using fool labels brought in from local grocery stores and markets, patients will apply the label reading guidelines and determine if the packaged food meet the [facility] guidelines. The purpose of this lesson is to provide patients with the opportunity to review, discuss, and practice hands-on learning of the [facility] Label Reading guidelines with actual packaged food labels. [place] Workshops are designed to teach patients ways to prepare quick, simple, and affordable recipes at home. The importance of nutrition's role in chronic disease risk reduction is reflected in its emphasis in the overall [facility] program. By learning how to prepare essential core [facility] Eating  Plan recipes, patients will increase control over what they eat; be able to customize the flavor of foods without the use of added salt, sugar, or fat; and improve the quality of the food they consume. By learning a set of core recipes which are easily assembled, quickly prepared, and affordable, patients are more likely to prepare more healthy foods at home. These workshops focus on convenient breakfasts, simple entres, side dishes, and desserts which can be prepared with minimal effort and are consistent with nutrition recommendations for cardiovascular risk reduction. Cooking [employer] are taught by a Engineer, materials (RD) who has been trained by the [employer]. The chef or RD has a clear understanding of the importance of minimizing - if not completely eliminating - added fat, sugar, and sodium in recipes. Throughout the series of [place] Workshop sessions, patients will learn about healthy ingredients and efficient methods of cooking to build confidence in their capability to prepare    Cooking School weekly topics:  Adding Flavor- Sodium-Free  Fast and Healthy Breakfasts  Powerhouse Plant-Based Proteins  Satisfying Salads and Dressings  Simple Sides and Sauces  International Cuisine-Spotlight on the [place] Zones  Delicious Desserts  Savory Soups  Efficiency Cooking - Meals in a Snap  Tasty Appetizers and Snacks  Comforting Weekend Breakfasts  One-Pot Wonders   Fast Evening Meals  Easy [place] (Psychosocial): New Thoughts, New Behaviors Clinical staff led group instruction and group discussion with PowerPoint presentation and patient guidebook. To enhance the learning environment the use of posters, models and videos may be added. Patients will learn and practice techniques for developing effective health and lifestyle goals. Patients will be able to effectively apply the goal  setting process learned to develop at least one new personal goal.  The purpose of this lesson is to expose patients to a new skill set of behavior modification techniques such as techniques setting SMART goals, overcoming barriers, and achieving new thoughts and new behaviors.  Managing Moods and Relationships Clinical staff led group instruction and group discussion with PowerPoint presentation and patient guidebook. To enhance the learning environment the use of posters, models and videos may be added. Patients will learn how emotional and chronic stress factors can impact their health and relationships. They will learn healthy ways to manage their moods and utilize positive coping mechanisms. In addition, ICR patients will learn ways to improve communication skills. The purpose of this lesson is to expose patients to ways of understanding how one's mood and health are intimately connected. Developing  a healthy outlook can help build positive relationships and connections with others. Patients will understand the importance of utilizing effective communication skills that include actively listening and being heard. They will learn and understand the importance of the "4 Cs" and especially Connections in fostering of a Healthy Mind-Set.  Healthy Sleep for a Healthy Heart Clinical staff led group instruction and group discussion with PowerPoint presentation and patient guidebook. To enhance the learning environment the use of posters, models and videos may be added. At the conclusion of this workshop, patients will be able to demonstrate knowledge of the importance of sleep to overall health, well-being, and quality of life. They will understand the symptoms of, and treatments for, common sleep disorders. Patients will also be able to identify daytime and nighttime behaviors which impact sleep, and they will be able to apply these tools to help manage sleep-related challenges. The purpose of this lesson is  to provide patients with a general overview of sleep and outline the importance of quality sleep. Patients will learn about a few of the most common sleep disorders. Patients will also be introduced to the concept of "sleep hygiene," and discover ways to self-manage certain sleeping problems through simple daily behavior changes. Finally, the workshop will motivate patients by clarifying the links between quality sleep and their goals of heart-healthy living.   Recognizing and Reducing Stress Clinical staff led group instruction and group discussion with PowerPoint presentation and patient guidebook. To enhance the learning environment the use of posters, models and videos may be added. At the conclusion of this workshop, patients will be able to understand the types of stress reactions, differentiate between acute and chronic stress, and recognize the impact that chronic stress has on their health. They will also be able to apply different coping mechanisms, such as reframing negative self-talk. Patients will have the opportunity to practice a variety of stress management techniques, such as deep abdominal breathing, progressive muscle relaxation, and/or guided imagery.  The purpose of this lesson is to educate patients on the role of stress in their lives and to provide healthy techniques for coping with it.  Learning Barriers/Preferences:  Learning Barriers/Preferences - 08/27/22 7253       Learning Barriers/Preferences   Learning Barriers Sight;Hearing   wears reading glasses, HOH, waiting for bilatreral hearing aids   Learning Preferences Skilled Demonstration;Video;Pictoral;Individual Instruction;Group Instruction             Education Topics:  Knowledge Questionnaire Score:  Knowledge Questionnaire Score - 08/27/22 0939       Knowledge Questionnaire Score   Pre Score 22/24             Core Components/Risk Factors/Patient Goals at Admission:  Personal Goals and Risk Factors  at Admission - 08/27/22 0939       Core Components/Risk Factors/Patient Goals on Admission    Weight Management Yes;Obesity;Weight Loss    Intervention Weight Management: Develop a combined nutrition and exercise program designed to reach desired caloric intake, while maintaining appropriate intake of nutrient and fiber, sodium and fats, and appropriate energy expenditure required for the weight goal.;Weight Management: Provide education and appropriate resources to help participant work on and attain dietary goals.;Weight Management/Obesity: Establish reasonable short term and long term weight goals.;Obesity: Provide education and appropriate resources to help participant work on and attain dietary goals.    Goal Weight: Long Term 220 lb (99.8 kg)    Expected Outcomes Short Term: Continue to assess and modify interventions until short term weight is  achieved;Long Term: Adherence to nutrition and physical activity/exercise program aimed toward attainment of established weight goal;Weight Maintenance: Understanding of the daily nutrition guidelines, which includes 25-35% calories from fat, 7% or less cal from saturated fats, less than 264m cholesterol, less than 1.5gm of sodium, & 5 or more servings of fruits and vegetables daily;Weight Loss: Understanding of general recommendations for a balanced deficit meal plan, which promotes 1-2 lb weight loss per week and includes a negative energy balance of [phone] kcal/d;Understanding recommendations for meals to include 15-35% energy as protein, 25-35% energy from fat, 35-60% energy from carbohydrates, less than 2033mof dietary cholesterol, 20-35 gm of total fiber daily;Understanding of distribution of calorie intake throughout the day with the consumption of 4-5 meals/snacks    Hypertension Yes    Intervention Provide education on lifestyle modifcations including regular physical activity/exercise, weight management, moderate sodium restriction and increased  consumption of fresh fruit, vegetables, and low fat dairy, alcohol moderation, and smoking cessation.;Monitor prescription use compliance.    Expected Outcomes Short Term: Continued assessment and intervention until BP is < 140/9048mG in hypertensive participants. < 130/103m49m in hypertensive participants with diabetes, heart failure or chronic kidney disease.;Long Term: Maintenance of blood pressure at goal levels.    Lipids Yes    Intervention Provide education and support for participant on nutrition & aerobic/resistive exercise along with prescribed medications to achieve LDL <70mg27mL >40mg.5mExpected Outcomes Short Term: Participant states understanding of desired cholesterol values and is compliant with medications prescribed. Participant is following exercise prescription and nutrition guidelines.;Long Term: Cholesterol controlled with medications as prescribed, with individualized exercise RX and with personalized nutrition plan. Value goals: LDL < 70mg, 93m> 40 mg.             Core Components/Risk Factors/Patient Goals Review:   Goals and Risk Factor Review     Row Name 09/10/22 1534 10/04/22 1153           Core Components/Risk Factors/Patient Goals Review   Personal Goals Review Weight Management/Obesity;Hypertension;Lipids Weight Management/Obesity;Hypertension;Lipids      Review Stephanos Hazaiahd intensive cardiac rehab on 09/09/22 and did well with exercise. vital signs were stable Carmino Nareken doing  did well with exercise. vital signs have been stable.Griffon Lucusst 2.5 kg since starting intensive cardiac rehab.      Expected Outcomes Junius Larayontinue to participate in intensive cardiac rehab for exercise, nutrition and lifestyle modifications Ezequias Trytonontinue to participate in intensive cardiac rehab for exercise, nutrition and lifestyle modifications               Core Components/Risk Factors/Patient Goals at Discharge (Final Review):   Goals and Risk  Factor Review - 10/04/22 1153       Core Components/Risk Factors/Patient Goals Review   Personal Goals Review Weight Management/Obesity;Hypertension;Lipids    Review Christiaan Prathamen doing  did well with exercise. vital signs have been stable.Jden Karronst 2.5 kg since starting intensive cardiac rehab.    Expected Outcomes Kassem Aryaanontinue to participate in intensive cardiac rehab for exercise, nutrition and lifestyle modifications             ITP Comments:  ITP Comments     Row Name 08/27/22 0834 09/10/22 1531 10/04/22 1007       ITP Comments Dr Traci TFransico Himdical Director, Introduction to [facility]iac Re[facility]. Initial Orientation Packet Reviewed with the patient Nikolos Feltond intensive cardiac rehab on 09/09/22 and did well  with exercise. 30 day ITP Review. [place] has good attendance and participation in intensive cardiac rehab              Comments: See ITP comments

## 2022-10-09 ENCOUNTER — Encounter (HOSPITAL_COMMUNITY)
Admission: RE | Admit: 2022-10-09 | Discharge: 2022-10-09 | Disposition: A | Discharge status: Home / Self Care | Payer: Medicare Other | Source: Ambulatory Visit | Attending: Interventional Cardiology | Admitting: Interventional Cardiology

## 2022-10-09 DIAGNOSIS — Z5189 Encounter for other specified aftercare: Secondary | ICD-10-CM | POA: Diagnosis not present

## 2022-10-09 DIAGNOSIS — Z9889 Other specified postprocedural states: Secondary | ICD-10-CM | POA: Diagnosis not present

## 2022-10-11 ENCOUNTER — Encounter (HOSPITAL_COMMUNITY)
Admission: RE | Admit: 2022-10-11 | Discharge: 2022-10-11 | Disposition: A | Discharge status: Home / Self Care | Payer: Medicare Other | Source: Ambulatory Visit | Attending: Interventional Cardiology | Admitting: Interventional Cardiology

## 2022-10-11 ENCOUNTER — Encounter (HOSPITAL_COMMUNITY): Payer: Medicare Other

## 2022-10-11 DIAGNOSIS — Z5189 Encounter for other specified aftercare: Secondary | ICD-10-CM | POA: Diagnosis not present

## 2022-10-11 DIAGNOSIS — Z9889 Other specified postprocedural states: Secondary | ICD-10-CM | POA: Diagnosis not present

## 2022-10-14 ENCOUNTER — Encounter (HOSPITAL_COMMUNITY): Payer: Medicare Other

## 2022-10-14 ENCOUNTER — Other Ambulatory Visit: Payer: Self-pay | Admitting: Family Medicine

## 2022-10-14 ENCOUNTER — Encounter (HOSPITAL_COMMUNITY)
Admission: RE | Admit: 2022-10-14 | Discharge: 2022-10-14 | Disposition: A | Discharge status: Home / Self Care | Payer: Medicare Other | Source: Ambulatory Visit | Attending: Interventional Cardiology | Admitting: Interventional Cardiology

## 2022-10-14 DIAGNOSIS — Z9889 Other specified postprocedural states: Secondary | ICD-10-CM

## 2022-10-14 DIAGNOSIS — Z5189 Encounter for other specified aftercare: Secondary | ICD-10-CM | POA: Diagnosis not present

## 2022-10-16 ENCOUNTER — Encounter (HOSPITAL_COMMUNITY): Payer: Medicare Other

## 2022-10-16 ENCOUNTER — Encounter (HOSPITAL_COMMUNITY)
Admission: RE | Admit: 2022-10-16 | Discharge: 2022-10-16 | Disposition: A | Discharge status: Home / Self Care | Payer: Medicare Other | Source: Ambulatory Visit | Attending: Interventional Cardiology | Admitting: Interventional Cardiology

## 2022-10-16 DIAGNOSIS — Z5189 Encounter for other specified aftercare: Secondary | ICD-10-CM | POA: Diagnosis not present

## 2022-10-16 DIAGNOSIS — Z9889 Other specified postprocedural states: Secondary | ICD-10-CM | POA: Diagnosis not present

## 2022-10-18 ENCOUNTER — Encounter (HOSPITAL_COMMUNITY)
Admission: RE | Admit: 2022-10-18 | Discharge: 2022-10-18 | Disposition: A | Discharge status: Home / Self Care | Payer: Medicare Other | Source: Ambulatory Visit | Attending: Interventional Cardiology | Admitting: Interventional Cardiology

## 2022-10-18 ENCOUNTER — Encounter (HOSPITAL_COMMUNITY): Payer: Medicare Other

## 2022-10-18 DIAGNOSIS — Z9889 Other specified postprocedural states: Secondary | ICD-10-CM | POA: Diagnosis not present

## 2022-10-18 DIAGNOSIS — Z5189 Encounter for other specified aftercare: Secondary | ICD-10-CM | POA: Diagnosis not present

## 2022-10-21 ENCOUNTER — Encounter (HOSPITAL_COMMUNITY): Payer: Medicare Other

## 2022-10-21 ENCOUNTER — Encounter (HOSPITAL_COMMUNITY)
Admission: RE | Admit: 2022-10-21 | Discharge: 2022-10-21 | Disposition: A | Discharge status: Home / Self Care | Payer: Medicare Other | Source: Ambulatory Visit | Attending: Interventional Cardiology | Admitting: Interventional Cardiology

## 2022-10-21 DIAGNOSIS — Z9889 Other specified postprocedural states: Secondary | ICD-10-CM

## 2022-10-21 DIAGNOSIS — Z5189 Encounter for other specified aftercare: Secondary | ICD-10-CM | POA: Diagnosis not present

## 2022-10-22 MED ORDER — AMOXICILLIN 500 MG PO CAPS: 2000.0000 mg | ORAL_CAPSULE | ORAL | 3 refills | Status: DC

## 2022-10-23 ENCOUNTER — Encounter (HOSPITAL_COMMUNITY)
Admission: RE | Admit: 2022-10-23 | Discharge: 2022-10-23 | Disposition: A | Discharge status: Home / Self Care | Payer: Medicare Other | Source: Ambulatory Visit | Attending: Interventional Cardiology | Admitting: Interventional Cardiology

## 2022-10-23 ENCOUNTER — Encounter (HOSPITAL_COMMUNITY): Payer: Medicare Other

## 2022-10-23 DIAGNOSIS — Z5189 Encounter for other specified aftercare: Secondary | ICD-10-CM | POA: Diagnosis not present

## 2022-10-23 DIAGNOSIS — Z9889 Other specified postprocedural states: Secondary | ICD-10-CM

## 2022-10-25 ENCOUNTER — Encounter (HOSPITAL_COMMUNITY): Payer: Medicare Other

## 2022-10-25 ENCOUNTER — Encounter (HOSPITAL_COMMUNITY)
Admission: RE | Admit: 2022-10-25 | Discharge: 2022-10-25 | Disposition: A | Discharge status: Home / Self Care | Payer: Medicare Other | Source: Ambulatory Visit | Attending: Interventional Cardiology | Admitting: Interventional Cardiology

## 2022-10-25 DIAGNOSIS — Z5189 Encounter for other specified aftercare: Secondary | ICD-10-CM | POA: Diagnosis not present

## 2022-10-25 DIAGNOSIS — Z9889 Other specified postprocedural states: Secondary | ICD-10-CM

## 2022-10-30 ENCOUNTER — Encounter (HOSPITAL_COMMUNITY)
Admission: RE | Admit: 2022-10-30 | Discharge: 2022-10-30 | Disposition: A | Discharge status: Home / Self Care | Payer: Medicare Other | Source: Ambulatory Visit | Attending: Interventional Cardiology | Admitting: Interventional Cardiology

## 2022-10-30 ENCOUNTER — Encounter (HOSPITAL_COMMUNITY): Payer: Medicare Other

## 2022-10-30 DIAGNOSIS — Z9889 Other specified postprocedural states: Secondary | ICD-10-CM | POA: Diagnosis not present

## 2022-10-30 DIAGNOSIS — Z5189 Encounter for other specified aftercare: Secondary | ICD-10-CM | POA: Diagnosis not present

## 2022-11-01 ENCOUNTER — Encounter (HOSPITAL_COMMUNITY): Payer: Medicare Other

## 2022-11-01 ENCOUNTER — Encounter (HOSPITAL_COMMUNITY)
Admission: RE | Admit: 2022-11-01 | Discharge: 2022-11-01 | Disposition: A | Discharge status: Home / Self Care | Payer: Medicare Other | Source: Ambulatory Visit | Attending: Interventional Cardiology | Admitting: Interventional Cardiology

## 2022-11-01 DIAGNOSIS — Z9889 Other specified postprocedural states: Secondary | ICD-10-CM | POA: Diagnosis not present

## 2022-11-01 DIAGNOSIS — Z5189 Encounter for other specified aftercare: Secondary | ICD-10-CM | POA: Diagnosis not present

## 2022-11-05 NOTE — Progress Notes (Signed)
Cardiac Individual Treatment Plan  Patient Details  Name: Ricky Meyers MRN: [id number] Date of Birth: [birth year] Referring Provider:   Flowsheet Row INTENSIVE CARDIAC REHAB ORIENT from 08/27/2022 in [facility] for Heart, Vascular, & [place]  Referring Provider Larae Grooms, MD       Initial Encounter Date:  [place] from 08/27/2022 in [facility] for Heart, Vascular, & [facility]  Date 08/27/22       Visit Diagnosis: 01/24/22 S/P Mitraclip, Mitral valve repair  Patient's Home Medications on Admission:  Current Outpatient Medications:    allopurinol (ZYLOPRIM) 300 MG tablet, TAKE 1 TABLET BY MOUTH EVERY DAY, Disp: 90 tablet, Rfl: 0   amoxicillin (AMOXIL) 500 MG capsule, Take 4 capsules (2,000 mg total) by mouth as directed. Take (4) capsules 1 hour before dental work, Disp: 4 capsule, Rfl: 3   apixaban (ELIQUIS) 2.5 MG TABS tablet, Take 1 tablet (2.5 mg total) by mouth 2 (two) times daily., Disp: 30 tablet, Rfl: 0   atorvastatin (LIPITOR) 40 MG tablet, TAKE 1 TABLET BY MOUTH EVERY DAY, Disp: 90 tablet, Rfl: 1   cyclobenzaprine (FLEXERIL) 10 MG tablet, Take 1 tablet (10 mg total) by mouth 3 (three) times daily as needed for muscle spasms. (Patient taking differently: Take 10 mg by mouth See admin instructions. Take 1 tablet (10 mg) by mouth scheduled in the morning & take 1 tablet (10 mg) by mouth scheduled in the evening, may take an additional dose in in the afternoon if needed for spasms/pain.), Disp: 90 tablet, Rfl: 5   famotidine (PEPCID) 20 MG tablet, Take 1 tablet (20 mg total) by mouth daily after supper., Disp: 90 tablet, Rfl: 3   gabapentin (NEURONTIN) 100 MG capsule, Take 1 capsule (100 mg total) by mouth 2 (two) times daily., Disp: 360 capsule, Rfl: 3   Melatonin 12 MG TABS, Take 12 mg by mouth at bedtime., Disp: , Rfl:    METAMUCIL FIBER PO, Take 1 Dose by mouth See admin  instructions. Take 1 dose by mouth (scheduled) daily, may take a second dose in the afternoon as needed for constipation, Disp: , Rfl:    oxyCODONE-acetaminophen (PERCOCET) 10-325 MG tablet, Take 1 tablet by mouth every 6 (six) hours as needed for pain., Disp: 120 tablet, Rfl: 0   PARoxetine (PAXIL) 20 MG tablet, Take 1 tablet (20 mg total) by mouth every morning., Disp: 90 tablet, Rfl: 3   telmisartan-hydrochlorothiazide (MICARDIS HCT) 80-25 MG tablet, Take 1 tablet by mouth daily., Disp: 90 tablet, Rfl: 3  Past Medical History: Past Medical History:  Diagnosis Date   Arthritis    neck and back    Chronic neck pain    Depression    ED (erectile dysfunction)    GERD (gastroesophageal reflux disease)    dysphagia   Gout    sees Dr. Leigh Aurora    Hyperlipidemia    Hypertension    OSA (obstructive sleep apnea) 12/18/2017   Severe with AHI at 70.1/hr and is now on CPAP at 11cm H2o   Prostate cancer [facility])    history of   Pulmonary embolism ([place]) 06/2019   S/P mitral valve clip implantation 01/24/2022   s/p Mitraclip XTW + NTW with Dr. Burt Knack and Dr. Ali Lowe   Sleep apnea    snoring/never checked    Tobacco Use: Social History   Tobacco Use  Smoking Status Never  Smokeless Tobacco Never    Labs: Review Flowsheet  More data exists      Latest Ref Rng & Units 08/22/2020 08/23/2021 12/20/2021 01/22/2022 08/26/2022  Labs for ITP Cardiac and Pulmonary Rehab  Cholestrol 0 - 200 mg/dL 180  153  - - 160   LDL (calc) 0 - 99 mg/dL 88  71  - - 71   HDL-C >39.00 mg/dL 73  65.20  - - 66.00   Trlycerides 0.0 - 149.0 mg/dL 96  84.0  - - 118.0   Hemoglobin A1c 4.6 - 6.5 % - 5.6  - - 5.6   PH, Arterial 7.35 - 7.45 - - 7.394  7.45  -  PCO2 arterial 32 - 48 mmHg - - 40.8  38  -  Bicarbonate 20.0 - 28.0 mmol/L - - 26.8  26.9  25.0  26.4  -  TCO2 22 - 32 mmol/L - - _0 - -  O2 Saturation % - - 67  71  95  98.5  -    Capillary Blood Glucose: Lab Results  Component Value Date    GLUCAP 127 (H) 12/02/2014     Exercise Target Goals: Exercise Program Goal: Individual exercise prescription set using results from initial 6 min walk test and THRR while considering  patient's activity barriers and safety.   Exercise Prescription Goal: Initial exercise prescription builds to 30-45 minutes a day of aerobic activity, 2-3 days per week.  Home exercise guidelines will be given to patient during program as part of exercise prescription that the participant will acknowledge.  Activity Barriers & Risk Stratification:  Activity Barriers & Cardiac Risk Stratification - 08/27/22 0942       Activity Barriers & Cardiac Risk Stratification   Activity Barriers Shortness of Breath;Muscular Weakness;Deconditioning;Joint Problems;Neck/Spine Problems;Left Hip Replacement;Back Problems;Decreased Ventricular Function    Cardiac Risk Stratification High             6 Minute Walk:  6 Minute Walk     Row Name 08/27/22 0848         6 Minute Walk   Phase Initial     Distance 1048 feet     Walk Time 6 minutes     # of Rest Breaks 1  break due to being over THR     MPH 1.98     METS 2     RPE 12     Perceived Dyspnea  3     VO2 Peak 6.96     Symptoms Yes (comment)     Comments SOB, RPD = 3     Resting HR 92 bpm     Resting BP 134/86     Resting Oxygen Saturation  95 %     Exercise Oxygen Saturation  during 6 min walk 94 %     Max Ex. HR 125 bpm     Max Ex. BP 150/90     2 Minute Post BP 142/86              Oxygen Initial Assessment:   Oxygen Re-Evaluation:   Oxygen Discharge (Final Oxygen Re-Evaluation):   Initial Exercise Prescription:  Initial Exercise Prescription - 08/27/22 0900       Date of Initial Exercise RX and Referring Provider   Date 08/27/22    Referring Provider Larae Grooms, MD    Expected Discharge Date 11/01/22      NuStep   Level 1    SPM 75    Minutes 25    METs 2  Prescription Details   Frequency (times per  week) 3    Duration Progress to 30 minutes of continuous aerobic without signs/symptoms of physical distress      Intensity   THRR 40-80% of Max Heartrate 57-114    Ratings of Perceived Exertion 11-13    Perceived Dyspnea 0-4      Progression   Progression Continue progressive overload as per policy without signs/symptoms or physical distress.      Resistance Training   Training Prescription Yes    Weight 3 lbs    Reps 10-15             Perform Capillary Blood Glucose checks as needed.  Exercise Prescription Changes:   Exercise Prescription Changes     Row Name 09/09/22 1000 10/04/22 1400 10/18/22 1000 11/01/22 1100       Response to Exercise   Blood Pressure (Admit) 116/70 112/70 120/78 120/76    Blood Pressure (Exercise) 130/80 124/72 144/76 164/84    Blood Pressure (Exit) 114/78 110/68 108/64 120/72    Heart Rate (Admit) 89 bpm 83 bpm 94 bpm 87 bpm    Heart Rate (Exercise) 108 bpm 96 bpm 111 bpm 112 bpm    Heart Rate (Exit) 98 bpm 89 bpm 99 bpm 96 bpm    Rating of Perceived Exertion (Exercise) _0 Symptoms None none none None    Comments Pt's first day in the CRP2 program reviewed mets, goals, home exercise with pt Reviewed METs Reviewed METs and goals    Duration Continue with 30 min of aerobic exercise without signs/symptoms of physical distress. Continue with 30 min of aerobic exercise without signs/symptoms of physical distress. Continue with 30 min of aerobic exercise without signs/symptoms of physical distress. Continue with 30 min of aerobic exercise without signs/symptoms of physical distress.    Intensity THRR unchanged THRR unchanged THRR unchanged THRR unchanged      Progression   Progression Continue to progress workloads to maintain intensity without signs/symptoms of physical distress. Continue to progress workloads to maintain intensity without signs/symptoms of physical distress. Continue to progress workloads to maintain intensity without  signs/symptoms of physical distress. --    Average METs 3.4 2.5 3.7 3.6      Resistance Training   Training Prescription Yes Yes Yes Yes    Weight 3 lbs 3 lbs 3 lbs 3 lbs    Reps 10-15 10-15 10-15 10-15    Time 10 Minutes 10 Minutes 10 Minutes 10 Minutes      Interval Training   Interval Training No No No No      NuStep   Level _1 SPM 99 85 113 104    Minutes _2 METs 3.4 2.5 3.7 3.6      Home Exercise Plan   Plans to continue exercise at -- [employer] (comment)  [place] (comment) [employer] (comment)    Frequency -- Add 2 additional days to program exercise sessions. Add 2 additional days to program exercise sessions. Add 2 additional days to program exercise sessions.    Initial Home Exercises Provided -- 10/04/22 10/04/22 10/04/22             Exercise Comments:   Exercise Comments     Row Name 09/09/22 1059 10/04/22 1431 10/18/22 1057 11/01/22 1154     Exercise Comments Pt's first day in the CRP2 program. Pt exercised with no complaints,  Reviewed METs, goals, home ExRx with pt. Pt tolerated exercise well with avg MET level of 2.5. Pt feels he is not progressing towards goals of increase strength, stamina, endurance due to set back with recent fall. Pt voices that he is focusing on diet for weight loss goals, and feels he is making progress. Pt will exercise on his own by going to [employer] 2x/week to use Nustep for 54mn and machine weights. Pt is excited for home exercise and feels it will help him get on track to meet his goals. Pt verbalized understanding of the home exercise Rx and was provided a copy. Reviewed METs with patient. Pt is making progress in regards to MET level. Pt wants to increaase on the Nustep next session. Reviewed METs and goals. Pt is progressing. Instructed pt not to work as hard on nustep today due to elevated BP and increased weight. Pt admits to having some salty foods yesterday.              Exercise Goals and Review:   Exercise Goals     Row Name 08/27/22 0943             Exercise Goals   Increase Physical Activity Yes       Intervention Provide advice, education, support and counseling about physical activity/exercise needs.;Develop an individualized exercise prescription for aerobic and resistive training based on initial evaluation findings, risk stratification, comorbidities and participant's personal goals.       Expected Outcomes Short Term: Attend rehab on a regular basis to increase amount of physical activity.;Long Term: Add in home exercise to make exercise part of routine and to increase amount of physical activity.;Long Term: Exercising regularly at least 3-5 days a week.       Increase Strength and Stamina Yes       Intervention Provide advice, education, support and counseling about physical activity/exercise needs.;Develop an individualized exercise prescription for aerobic and resistive training based on initial evaluation findings, risk stratification, comorbidities and participant's personal goals.       Expected Outcomes Long Term: Improve cardiorespiratory fitness, muscular endurance and strength as measured by increased METs and functional capacity (6MWT);Short Term: Perform resistance training exercises routinely during rehab and add in resistance training at home;Short Term: Increase workloads from initial exercise prescription for resistance, speed, and METs.       Able to understand and use rate of perceived exertion (RPE) scale Yes       Intervention Provide education and explanation on how to use RPE scale       Expected Outcomes Short Term: Able to use RPE daily in rehab to express subjective intensity level;Long Term:  Able to use RPE to guide intensity level when exercising independently       Knowledge and understanding of Target Heart Rate Range (THRR) Yes       Intervention Provide education and explanation of THRR including how the  numbers were predicted and where they are located for reference       Expected Outcomes Short Term: Able to state/look up THRR;Short Term: Able to use daily as guideline for intensity in rehab;Long Term: Able to use THRR to govern intensity when exercising independently       Understanding of Exercise Prescription Yes       Intervention Provide education, explanation, and written materials on patient's individual exercise prescription       Expected Outcomes Short Term: Able to explain program exercise prescription;Long Term: Able to explain home exercise  prescription to exercise independently                Exercise Goals Re-Evaluation :  Exercise Goals Re-Evaluation     Row Name 09/09/22 1058 10/04/22 1417 11/01/22 1148         Exercise Goal Re-Evaluation   Exercise Goals Review Increase Physical Activity;Increase Strength and Stamina;Able to understand and use rate of perceived exertion (RPE) scale;Knowledge and understanding of Target Heart Rate Range (THRR);Understanding of Exercise Prescription Increase Physical Activity;Increase Strength and Stamina;Able to understand and use rate of perceived exertion (RPE) scale;Knowledge and understanding of Target Heart Rate Range (THRR);Understanding of Exercise Prescription;Able to check pulse independently Increase Physical Activity;Increase Strength and Stamina;Able to understand and use rate of perceived exertion (RPE) scale;Knowledge and understanding of Target Heart Rate Range (THRR);Understanding of Exercise Prescription;Able to check pulse independently     Comments Pt's first day in the CRP2 program. Pt understands the exercise RX, RPE scale and THRR, Reviewed METs, goals, home ExRx with pt. Pt tolerated exercise well with avg MET level of 2.5. Pt feels he is not progressing towards goals of increase strength, stamina, endurance due to set back with recent fall. Pt voices that he is focusing on diet for weight loss goals, and feels he is  making progress. Pt will exercise on his own by going to [employer] 2x/week to use Nustep for 23mn and machine weights. Pt is excited for home exercise and feels it will help him get on track to meet his goals. Pt is progressing well in program. Reviewed METs and goals. Pt voices improvement in strength and stamina. Voices he feels more energetic when he gets up in the morning.     Expected Outcomes Will continue to montior patient and progress exercise workloads as tolerated. Will continue to montior patient and progress exercise workloads as tolerated. Will continue to montior patient and progress exercise workloads as tolerated.              Discharge Exercise Prescription (Final Exercise Prescription Changes):  Exercise Prescription Changes - 11/01/22 1100       Response to Exercise   Blood Pressure (Admit) 120/76    Blood Pressure (Exercise) 164/84    Blood Pressure (Exit) 120/72    Heart Rate (Admit) 87 bpm    Heart Rate (Exercise) 112 bpm    Heart Rate (Exit) 96 bpm    Rating of Perceived Exertion (Exercise) 11    Symptoms None    Comments Reviewed METs and goals    Duration Continue with 30 min of aerobic exercise without signs/symptoms of physical distress.    Intensity THRR unchanged      Progression   Average METs 3.6      Resistance Training   Training Prescription Yes    Weight 3 lbs    Reps 10-15    Time 10 Minutes      Interval Training   Interval Training No      NuStep   Level 4    SPM 104    Minutes 30    METs 3.6      Home Exercise Plan   [facility] [employer] [employer]([facility])    Frequency Add 2 additional days to program exercise sessions.    Initial Home Exercises Provided 10/04/22             Nutrition:  Target Goals: Understanding of nutrition guidelines, daily intake of sodium <15025m cholesterol <20060mcalories 30% from fat and 7%  or less from saturated fats, daily to have 5 or more servings of fruits and  vegetables.  Biometrics:  Pre Biometrics - 08/27/22 0800       Pre Biometrics   Waist Circumference 46.25 inches    Hip Circumference 47.5 inches    Waist to Hip Ratio 0.97 %    Triceps Skinfold 13 mm    % Body Fat 32.9 %    Grip Strength 33 kg    Flexibility --   Not done, recent hip replacement   Single Leg Stand 29 seconds              Nutrition Therapy Plan and Nutrition Goals:  Nutrition Therapy & Goals - 10/23/22 1407       Nutrition Therapy   Diet Heart Healthy Diet    Drug/Food Interactions Statins/Certain Fruits      Personal Nutrition Goals   Nutrition Goal Patient to identify strategies for managing cardiovascular risk by attending the weekly [facility] education and nutrition courses    Personal Goal #2 Patient to identify food sources and limit daily intake of saturated fat, trans fat, sodium, and refined carbohydrates    Personal Goal #3 Patient to limit sodium to <1530m per day.    Comments Goals in action. GCharbelremains very motivated to make lifestyle and dietary changes to aid with heart health and weight loss. He continues to attend the [facility] education and nutrition series. He reports improved understanding of heart healthy diet  including increased high fiber foods, reducing saturated fat intake, and reading food labels for sodium and sugar. He continues to struggle with weight loss and inconsistencies in snacking, portions sizes, etc. He is down 2.4# since starting with our program; though he is up ~3# from his lowest weight. His wife is very supportive of lifestyle changes.      Intervention Plan   Intervention Prescribe, educate and counsel regarding individualized specific dietary modifications aiming towards targeted core components such as weight, hypertension, lipid management, diabetes, heart failure and other comorbidities.;Nutrition handout(s) given to patient.    Expected Outcomes Short Term Goal: Understand basic principles of dietary content,  such as calories, fat, sodium, cholesterol and nutrients.;Long Term Goal: Adherence to prescribed nutrition plan.             Nutrition Assessments:  MEDIFICTS Score Key: ?70 Need to make dietary changes  40-70 Heart Healthy Diet ? 40 Therapeutic Level Cholesterol Diet    Picture Your Plate Scores: <<59Unhealthy dietary pattern with much room for improvement. 41-50 Dietary pattern unlikely to meet recommendations for good health and room for improvement. 51-60 More healthful dietary pattern, with some room for improvement.  >60 Healthy dietary pattern, although there may be some specific behaviors that could be improved.    Nutrition Goals Re-Evaluation:  Nutrition Goals Re-Evaluation     Row Name 09/09/22 0943 10/04/22 1028 10/23/22 1407         Goals   Current Weight 240 lb 8.4 oz (109.1 kg) 240 lb 1.3 oz (108.9 kg) 243 lb 2.7 oz (110.3 kg)     Comment lipids WNL, A1c WNL A1c WNL, Lipids WNL, down 5.5# since orientation date no new labs; A1c WNL, Lipids WNL     Expected Outcome GBrycelives at home with his significant other, NS[place] She is supportive of making dietary changes and she doe sthe majorty of the grocery shopping and cooking. GParishreports weight gain of >20# over the last year related to surgery and sedentary habits.  He is motivated to lose ~30#. Goals in action. Hilmer remains very motivated to make lifestyle and dietary changes to aid with heart health and weight loss. He continues to attend the [facility] education and nutrition series. He has started making many changes including increased high fiber foods including vegetables and mindfulness of saturated fat intake and portion sizes. He continues to work on reading food labels for sodium and sugar. He is down 5.5# since starting with our program. His wife is very supportive of lifestyle changes. Goals in action. Liborio remains very motivated to make lifestyle and dietary changes to aid with heart health and weight  loss. He continues to attend the [facility] education and nutrition series. He reports improved understanding of heart healthy diet including increased high fiber foods, reducing saturated fat intake, and reading food labels for sodium and sugar. He continues to struggle with weight loss and inconsistencies in snacking, portions sizes, etc. He is down 2.4# since starting with our program; though he is up ~3# from his lowest weight. His wife is very supportive of lifestyle changes. Quavion will continue to benefit from adherence to the [facility] eating plan to aid with weight of 0.5-2.0# per week, sodium intake <1592m/day, and improved diet quality.              Nutrition Goals Re-Evaluation:  Nutrition Goals Re-Evaluation     Row Name 09/09/22 0943 10/04/22 1028 10/23/22 1407         Goals   Current Weight 240 lb 8.4 oz (109.1 kg) 240 lb 1.3 oz (108.9 kg) 243 lb 2.7 oz (110.3 kg)     Comment lipids WNL, A1c WNL A1c WNL, Lipids WNL, down 5.5# since orientation date no new labs; A1c WNL, Lipids WNL     Expected Outcome GYsmaellives at home with his significant other, NS[place] She is supportive of making dietary changes and she doe sthe majorty of the grocery shopping and cooking. GZaydennreports weight gain of >20# over the last year related to surgery and sedentary habits. He is motivated to lose ~30#. Goals in action. GGalenremains very motivated to make lifestyle and dietary changes to aid with heart health and weight loss. He continues to attend the [facility] education and nutrition series. He has started making many changes including increased high fiber foods including vegetables and mindfulness of saturated fat intake and portion sizes. He continues to work on reading food labels for sodium and sugar. He is down 5.5# since starting with our program. His wife is very supportive of lifestyle changes. Goals in action. GPhiremains very motivated to make lifestyle and dietary changes to aid with heart  health and weight loss. He continues to attend the [facility] education and nutrition series. He reports improved understanding of heart healthy diet including increased high fiber foods, reducing saturated fat intake, and reading food labels for sodium and sugar. He continues to struggle with weight loss and inconsistencies in snacking, portions sizes, etc. He is down 2.4# since starting with our program; though he is up ~3# from his lowest weight. His wife is very supportive of lifestyle changes. GChevonwill continue to benefit from adherence to the Prit[facility] eating plan to aid with weight of 0.5-2.0# per week, sodium intake <15027mday, and improved diet quality.              Nutrition Goals Discharge (Final Nutrition Goals Re-Evaluation):  Nutrition Goals Re-Evaluation - 10/23/22 1407       Goals   Current Weight 243  lb 2.7 oz (110.3 kg)    Comment no new labs; A1c WNL, Lipids WNL    Expected Outcome Goals in action. Keyondre remains very motivated to make lifestyle and dietary changes to aid with heart health and weight loss. He continues to attend the [facility] education and nutrition series. He reports improved understanding of heart healthy diet including increased high fiber foods, reducing saturated fat intake, and reading food labels for sodium and sugar. He continues to struggle with weight loss and inconsistencies in snacking, portions sizes, etc. He is down 2.4# since starting with our program; though he is up ~3# from his lowest weight. His wife is very supportive of lifestyle changes. Rydan will continue to benefit from adherence to the Prit[facility] eating plan to aid with weight of 0.5-2.0# per week, sodium intake <157m/day, and improved diet quality.             Psychosocial: Target Goals: Acknowledge presence or absence of significant depression and/or stress, maximize coping skills, provide positive support system. Participant is able to verbalize types and ability to use  techniques and skills needed for reducing stress and depression.  Initial Review & Psychosocial Screening:  Initial Psych Review & Screening - 08/27/22 0841       Initial Review   Current issues with History of Depression      Family Dynamics   Good Support System? Yes   GLyndallhas his domestic partner NSey[place]for support. GGustavus Messingalso has two daughters     Barriers   Psychosocial barriers to participate in program The patient should benefit from training in stress management and relaxation.      Screening Interventions   Interventions Encouraged to exercise             Quality of Life Scores:  Quality of Life - 08/27/22 0937       Quality of Life   Select Quality of Life      Quality of Life Scores   Health/Function Pre 21.87 %    Socioeconomic Pre 24.83 %    Psych/Spiritual Pre 23.57 %    Family Pre 24.1 %    GLOBAL Pre 23.11 %            Scores of 19 and below usually indicate a poorer quality of life in these areas.  A difference of  2-3 points is a clinically meaningful difference.  A difference of 2-3 points in the total score of the Quality of Life Index has been associated with significant improvement in overall quality of life, self-image, physical symptoms, and general health in studies assessing change in quality of life.  PHQ-9: Review Flowsheet  More data exists      09/12/2022 08/27/2022 08/26/2022 02/15/2022 11/12/2021  Depression screen PHQ 2/9  Decreased Interest 0 0 0 0 0  Down, Depressed, Hopeless 0 0 0 0 0  PHQ - 2 Score 0 0 0 0 0  Altered sleeping 0 - 0 0 -  Tired, decreased energy 0 - 2 2 -  Change in appetite 0 - 0 0 -  Feeling bad or failure about yourself  0 - 0 0 -  Trouble concentrating 0 - 0 0 -  Moving slowly or fidgety/restless 0 - 0 0 -  Suicidal thoughts 0 - 0 0 -  PHQ-9 Score 0 - 2 2 -  Difficult doing work/chores Not difficult at all - Not difficult at all Not difficult at all -   Interpretation of Total Score  Total  Score  Depression Severity:  1-4 = Minimal depression, 5-9 = Mild depression, 10-14 = Moderate depression, 15-19 = Moderately severe depression, 20-27 = Severe depression   Psychosocial Evaluation and Intervention:   Psychosocial Re-Evaluation:  Psychosocial Re-Evaluation     [place] Name 09/10/22 1532 10/04/22 1015 11/05/22 1207         Psychosocial Re-Evaluation   Current issues with History of Depression History of Depression History of Depression     Comments Claire did not voice any concerns or stressors on his first day of exercise Hawk has not voiced any  increased concerns or stressors during  exercise at intensive cardiac rehab. Keanu continues not to voice any  increased concerns or stressors during  exercise at intensive cardiac rehab.     Expected Outcomes Martell will have controled or decreased depression upon completion of intensive cardiac rehab Strummer will have controled or decreased depression upon completion of intensive cardiac rehab Jaasiel will have controled or decreased depression upon completion of intensive cardiac rehab     Interventions Stress management education;Encouraged to attend Cardiac Rehabilitation for the exercise Stress management education;Encouraged to attend Cardiac Rehabilitation for the exercise Stress management education;Encouraged to attend Cardiac Rehabilitation for the exercise     Continue Psychosocial Services  No Follow up required No Follow up required No Follow up required              Psychosocial Discharge (Final Psychosocial Re-Evaluation):  Psychosocial Re-Evaluation - 11/05/22 1207       Psychosocial Re-Evaluation   Current issues with History of Depression    Comments Humza continues not to voice any  increased concerns or stressors during  exercise at intensive cardiac rehab.    Expected Outcomes Tymier will have controled or decreased depression upon completion of intensive cardiac rehab    Interventions Stress management  education;Encouraged to attend Cardiac Rehabilitation for the exercise    Continue Psychosocial Services  No Follow up required             Vocational Rehabilitation: Provide vocational rehab assistance to qualifying candidates.   Vocational Rehab Evaluation & Intervention:  Vocational Rehab - 08/27/22 0842       Initial Vocational Rehab Evaluation & Intervention   Assessment shows need for Vocational Rehabilitation No   Gustavus Messing is retired and does not need vocational rehab a this time            Education: Education Goals: Education classes will be provided on a weekly basis, covering required topics. Participant will state understanding/return demonstration of topics presented.    Education     [place] Name 09/09/22 0900     Education   Cardiac Education Topics Pritikin   IT sales professional Nutrition   Nutrition Workshop Label Reading   Instruction Review Code 1- Verbalizes Understanding   Class Start Time 0813   Class Stop Time 0904   Class Time Calculation (min) 51 min    [place] Name 09/23/22 1100     Education   Cardiac Education Topics [place]   Select Workshops     Workshops   Educator Exercise Physiologist   Select Exercise   Exercise Workshop Exercise Basics: Building Your Action Plan   Instruction Review Code 1- Verbalizes Understanding   Class Start Time 0810   Class Stop Time 5621   Class Time Calculation (min) 45 min    [place] Name 09/25/22 1000     Education  Cardiac Education Topics [employer]   Educator Dietitian   Weekly Topic Efficiency Cooking - Meals in a Snap  Stuffed Butternut Squash   Instruction Review Code 1- Verbalizes Understanding   Class Start Time 0815   Class Stop Time 0856   Class Time Calculation (min) 41 min    [place] Name 09/30/22 0900     Education   Cardiac Education Topics Pritikin   Select Core Videos     Core Videos   Educator  Dietitian   Select Nutrition   Nutrition Nutrition Action Plan   Instruction Review Code 1- Verbalizes Understanding   Class Start Time 0810   Class Stop Time 0853   Class Time Calculation (min) 43 min    [place] Name 10/04/22 0900     Education   Cardiac Education Topics Pritikin   Select Core Videos     Core Videos   Educator Nurse   Select General Education   General Education Hypertension and Heart Disease   Instruction Review Code 1- Verbalizes Understanding   Class Start Time 0815   Class Stop Time 0900   Class Time Calculation (min) 45 min    [place] Name 10/07/22 0900     Education   Cardiac Education Topics Pritikin   Select Workshops     Workshops   Educator Exercise Physiologist   Select Psychosocial   Psychosocial Workshop Other  Head to heart   Instruction Review Code 1- Verbalizes Understanding   Class Start Time [phone]   Class Stop Time 0908   Class Time Calculation (min) 56 min    [place] Name 10/09/22 1000     Education   Cardiac Education Topics [employer]   Educator Dietitian   Weekly Topic One-Pot Wonders   Instruction Review Code 1- Verbalizes Understanding   Class Start Time 0815   Class Stop Time 0900   Class Time Calculation (min) 45 min    [place] Name 10/11/22 0900     Education   Cardiac Education Topics [place] Name 10/11/22 1000     Education   Cardiac Education Topics Pritikin   Select Core Videos     Core Videos   Educator Dietitian   Nutrition Dining Out - Part 1   Instruction Review Code 1- Verbalizes Understanding   Class Start Time 0815   Class Stop Time 0903   Class Time Calculation (min) 48 min    [place] Name 10/14/22 0900     Education   Cardiac Education Topics Pritikin   Select Core Videos     Core Videos   Educator Exercise Physiologist   Select Exercise Education   Exercise Education Biomechanial Limitations   Instruction Review Code 1- Verbalizes Understanding    Class Start Time [phone]   Class Stop Time 0901   Class Time Calculation (min) 49 min    [place] Name 10/16/22 1200     Education   Cardiac Education Topics [place]   Educator Dietitian   Weekly Topic Fast Evening Meals   Instruction Review Code 1- Verbalizes Understanding   Class Start Time [phone]   Class Stop Time 0851   Class Time Calculation (min) 41 min    [place] Name 10/18/22 1500     Education   Cardiac Education Topics Pritikin   Administrator, sports  Core Videos   Educator Exercise Physiologist   Select Psychosocial   Psychosocial How Our Thoughts Can Heal Our Hearts   Instruction Review Code 1- Verbalizes Understanding   Class Start Time 0805   Class Stop Time 3734   Class Time Calculation (min) 45 min    [place] Name 10/21/22 0900     Education   Cardiac Education Topics Pritikin   Select Workshops     Workshops   Educator Dietitian   Select Nutrition   Nutrition Workshop Fueling a Designer, multimedia   Instruction Review Code 1- Verbalizes Understanding   Class Start Time 0815   Class Stop Time 0901   Class Time Calculation (min) 46 min    [place] Name 10/23/22 1200     Education   Cardiac Education Topics Pritikin   Financial trader   Weekly Topic International Cuisine- Spotlight on the [place]ones   Instruction Review Code 1- Verbalizes Understanding   Class Start Time 0815   Class Stop Time [phone]   Class Time Calculation (min) 37 min    [place] Name 10/25/22 0900     Education   Cardiac Education Topics Pritikin   Select Workshops     Workshops   Educator Exercise Physiologist   Psychosocial Workshop Recognizing and Reducing Stress   Instruction Review Code 1- Verbalizes Understanding   Class Start Time [phone]   Class Stop Time 0900   Class Time Calculation (min) 50 min    [place] Name 10/30/22 1300     Education   Cardiac Education Topics Pritikin   Catering manager   Educator Exercise Physiologist   Select Nutrition   Nutrition Cooking - Healthy Salads and Dressing   Instruction Review Code 1- Verbalizes Understanding   Class Start Time 0803   Class Stop Time 0842   Class Time Calculation (min) 39 min    [place] Name 11/01/22 0900     Education   Cardiac Education Topics Pritikin   Select Core Videos     Core Videos   Educator Exercise Physiologist   Select General Education   General Education Heart Disease Risk Reduction   Instruction Review Code 1- Verbalizes Understanding   Class Start Time [phone]   Class Stop Time 0855   Class Time Calculation (min) 45 min            Core Videos: Exercise    Move It!  Clinical staff conducted group or individual video education with verbal and written material and guidebook.  Patient learns the recommended Pritikin exercise program. Exercise with the goal of living a long, healthy life. Some of the health benefits of exercise include controlled diabetes, healthier blood pressure levels, improved cholesterol levels, improved heart and lung capacity, improved sleep, and better body composition. Everyone should speak with their doctor before starting or changing an exercise routine.  Biomechanical Limitations Clinical staff conducted group or individual video education with verbal and written material and guidebook.  Patient learns how biomechanical limitations can impact exercise and how we can mitigate and possibly overcome limitations to have an impactful and balanced exercise routine.  Body Composition Clinical staff conducted group or individual video education with verbal and written material and guidebook.  Patient learns that body composition (ratio of muscle mass to fat mass) is a key component to assessing overall fitness, rather than body weight alone. Increased fat mass, especially visceral belly fat,  can put [place] at increased risk for metabolic syndrome, type 2 diabetes, heart  disease, and even death. It is recommended to combine diet and exercise (cardiovascular and resistance training) to improve your body composition. Seek guidance from your physician and exercise physiologist before implementing an exercise routine.  Exercise Action Plan Clinical staff conducted group or individual video education with verbal and written material and guidebook.  Patient learns the recommended strategies to achieve and enjoy long-term exercise adherence, including variety, self-motivation, self-efficacy, and positive decision making. Benefits of exercise include fitness, good health, weight management, more energy, better sleep, less stress, and overall well-being.  Medical   Heart Disease Risk Reduction Clinical staff conducted group or individual video education with verbal and written material and guidebook.  Patient learns our heart is our most vital organ as it circulates oxygen, nutrients, white blood cells, and hormones throughout the entire body, and carries waste away. Data supports a plant-based eating plan like the Pritikin Program for its effectiveness in slowing progression of and reversing heart disease. The video provides a number of recommendations to address heart disease.   Metabolic Syndrome and Belly Fat  Clinical staff conducted group or individual video education with verbal and written material and guidebook.  Patient learns what metabolic syndrome is, how it leads to heart disease, and how one can reverse it and keep it from coming back. You have metabolic syndrome if you have 3 of the following 5 criteria: abdominal obesity, high blood pressure, high triglycerides, low HDL cholesterol, and high blood sugar.  Hypertension and Heart Disease Clinical staff conducted group or individual video education with verbal and written material and guidebook.  Patient learns that high blood pressure, or hypertension, is very common in the [place]. Hypertension is  largely due to excessive salt intake, but other important risk factors include being overweight, physical inactivity, drinking too much alcohol, smoking, and not eating enough potassium from fruits and vegetables. High blood pressure is a leading risk factor for heart attack, stroke, congestive heart failure, dementia, kidney failure, and premature death. Long-term effects of excessive salt intake include stiffening of the arteries and thickening of heart muscle and organ damage. Recommendations include ways to reduce hypertension and the risk of heart disease.  Diseases of Our Time - Focusing on Diabetes Clinical staff conducted group or individual video education with verbal and written material and guidebook.  Patient learns why the best way to stop diseases of our time is prevention, through food and other lifestyle changes. Medicine (such as prescription pills and surgeries) is often only a Band-Aid on the problem, not a long-term solution. Most common diseases of our time include obesity, type 2 diabetes, hypertension, heart disease, and cancer. The Pritikin Program is recommended and has been proven to help reduce, reverse, and/or prevent the damaging effects of metabolic syndrome.  Nutrition   Overview of the Pritikin Eating Plan  Clinical staff conducted group or individual video education with verbal and written material and guidebook.  Patient learns about the [place] for disease risk reduction. The [place] emphasizes a wide variety of unrefined, minimally-processed carbohydrates, like fruits, vegetables, whole grains, and legumes. Go, Caution, and Stop food choices are explained. Plant-based and lean animal proteins are emphasized. Rationale provided for low sodium intake for blood pressure control, low added sugars for blood sugar stabilization, and low added fats and oils for coronary artery disease risk reduction and weight management.  Calorie Density  Clinical  staff conducted group or  individual video education with verbal and written material and guidebook.  Patient learns about calorie density and how it impacts the Pritikin Eating Plan. Knowing the characteristics of the food you choose will help you decide whether those foods will lead to weight gain or weight loss, and whether you want to consume more or less of them. Weight loss is usually a side effect of the Pritikin Eating Plan because of its focus on low calorie-dense foods.  Label Reading  Clinical staff conducted group or individual video education with verbal and written material and guidebook.  Patient learns about the Pritikin recommended label reading guidelines and corresponding recommendations regarding calorie density, added sugars, sodium content, and whole grains.  Dining Out - Part 1  Clinical staff conducted group or individual video education with verbal and written material and guidebook.  Patient learns that restaurant meals can be sabotaging because they can be so high in calories, fat, sodium, and/or sugar. Patient learns recommended strategies on how to positively address this and avoid unhealthy pitfalls.  Facts on Fats  Clinical staff conducted group or individual video education with verbal and written material and guidebook.  Patient learns that lifestyle modifications can be just as effective, if not more so, as many medications for lowering your risk of heart disease. A Pritikin lifestyle can help to reduce your risk of inflammation and atherosclerosis (cholesterol build-up, or plaque, in the artery walls). Lifestyle interventions such as dietary choices and physical activity address the cause of atherosclerosis. A review of the types of fats and their impact on blood cholesterol levels, along with dietary recommendations to reduce fat intake is also included.  Nutrition Action Plan  Clinical staff conducted group or individual video education with verbal and written  material and guidebook.  Patient learns how to incorporate Pritikin recommendations into their lifestyle. Recommendations include planning and keeping personal health goals in mind as an important part of their success.  Healthy Mind-Set    Healthy Minds, Bodies, Hearts  Clinical staff conducted group or individual video education with verbal and written material and guidebook.  Patient learns how to identify when they are stressed. Video will discuss the impact of that stress, as well as the many benefits of stress management. Patient will also be introduced to stress management techniques. The way we think, act, and feel has an impact on our hearts.  How Our Thoughts Can Heal Our Hearts  Clinical staff conducted group or individual video education with verbal and written material and guidebook.  Patient learns that negative thoughts can cause depression and anxiety. This can result in negative lifestyle behavior and serious health problems. Cognitive behavioral therapy is an effective method to help control our thoughts in order to change and improve our emotional outlook.  Additional Videos:  Exercise    Improving Performance  Clinical staff conducted group or individual video education with verbal and written material and guidebook.  Patient learns to use a non-linear approach by alternating intensity levels and lengths of time spent exercising to help burn more calories and lose more body fat. Cardiovascular exercise helps improve heart health, metabolism, hormonal balance, blood sugar control, and recovery from fatigue. Resistance training improves strength, endurance, balance, coordination, reaction time, metabolism, and muscle mass. Flexibility exercise improves circulation, posture, and balance. Seek guidance from your physician and exercise physiologist before implementing an exercise routine and learn your capabilities and proper form for all exercise.  Introduction to Yoga  Clinical  staff conducted group or individual video education  with verbal and written material and guidebook.  Patient learns about yoga, a discipline of the coming together of mind, breath, and body. The benefits of yoga include improved flexibility, improved range of motion, better posture and core strength, increased lung function, weight loss, and positive self-image. Yoga's heart health benefits include lowered blood pressure, healthier heart rate, decreased cholesterol and triglyceride levels, improved immune function, and reduced stress. Seek guidance from your physician and exercise physiologist before implementing an exercise routine and learn your capabilities and proper form for all exercise.  Medical   Aging: Enhancing Your Quality of Life  Clinical staff conducted group or individual video education with verbal and written material and guidebook.  Patient learns key strategies and recommendations to stay in good physical health and enhance quality of life, such as prevention strategies, having an advocate, securing a [place], and keeping a list of medications and system for tracking them. It also discusses how to avoid risk for bone loss.  Biology of Weight Control  Clinical staff conducted group or individual video education with verbal and written material and guidebook.  Patient learns that weight gain occurs because we consume more calories than we burn (eating more, moving less). Even if your body weight is normal, you may have higher ratios of fat compared to muscle mass. Too much body fat puts you at increased risk for cardiovascular disease, heart attack, stroke, type 2 diabetes, and obesity-related cancers. In addition to exercise, following the [place] can help reduce your risk.  Decoding Lab Results  Clinical staff conducted group or individual video education with verbal and written material and guidebook.  Patient learns that lab test  reflects one measurement whose values change over time and are influenced by many factors, including medication, stress, sleep, exercise, food, hydration, pre-existing medical conditions, and more. It is recommended to use the knowledge from this video to become more involved with your lab results and evaluate your numbers to speak with your doctor.   Diseases of Our Time - Overview  Clinical staff conducted group or individual video education with verbal and written material and guidebook.  Patient learns that according to the CDC, 50% to 70% of chronic diseases (such as obesity, type 2 diabetes, elevated lipids, hypertension, and heart disease) are avoidable through lifestyle improvements including healthier food choices, listening to satiety cues, and increased physical activity.  Sleep Disorders Clinical staff conducted group or individual video education with verbal and written material and guidebook.  Patient learns how good quality and duration of sleep are important to overall health and well-being. Patient also learns about sleep disorders and how they impact health along with recommendations to address them, including discussing with a physician.  Nutrition  Dining Out - Part 2 Clinical staff conducted group or individual video education with verbal and written material and guidebook.  Patient learns how to plan ahead and communicate in order to maximize their dining experience in a healthy and nutritious manner. Included are recommended food choices based on the type of restaurant the patient is visiting.   Fueling a Best boy conducted group or individual video education with verbal and written material and guidebook.  There is a strong connection between our food choices and our health. Diseases like obesity and type 2 diabetes are very prevalent and are in large-part due to lifestyle choices. The Pritikin Eating Plan provides plenty of food and hunger-curbing  satisfaction. It is easy to follow,  affordable, and helps reduce health risks.  Menu Workshop  Clinical staff conducted group or individual video education with verbal and written material and guidebook.  Patient learns that restaurant meals can sabotage health goals because they are often packed with calories, fat, sodium, and sugar. Recommendations include strategies to plan ahead and to communicate with the manager, chef, or server to help order a healthier meal.  Planning Your Eating Strategy  Clinical staff conducted group or individual video education with verbal and written material and guidebook.  Patient learns about the [place] and its benefit of reducing the risk of disease. The [place] does not focus on calories. Instead, it emphasizes high-quality, nutrient-rich foods. By knowing the characteristics of the foods, we choose, we can determine their calorie density and make informed decisions.  Targeting Your Nutrition Priorities  Clinical staff conducted group or individual video education with verbal and written material and guidebook.  Patient learns that lifestyle habits have a tremendous impact on disease risk and progression. This video provides eating and physical activity recommendations based on your personal health goals, such as reducing LDL cholesterol, losing weight, preventing or controlling type 2 diabetes, and reducing high blood pressure.  Vitamins and Minerals  Clinical staff conducted group or individual video education with verbal and written material and guidebook.  Patient learns different ways to obtain key vitamins and minerals, including through a recommended healthy diet. It is important to discuss all supplements you take with your doctor.   Healthy Mind-Set    Smoking Cessation  Clinical staff conducted group or individual video education with verbal and written material and guidebook.  Patient learns that cigarette smoking and  tobacco addiction pose a serious health risk which affects millions of people. Stopping smoking will significantly reduce the risk of heart disease, lung disease, and many forms of cancer. Recommended strategies for quitting are covered, including working with your doctor to develop a successful plan.  Culinary   Becoming a Financial trader conducted group or individual video education with verbal and written material and guidebook.  Patient learns that cooking at home can be healthy, cost-effective, quick, and puts them in control. Keys to cooking healthy recipes will include looking at your recipe, assessing your equipment needs, planning ahead, making it simple, choosing cost-effective seasonal ingredients, and limiting the use of added fats, salts, and sugars.  Cooking - Breakfast and Snacks  Clinical staff conducted group or individual video education with verbal and written material and guidebook.  Patient learns how important breakfast is to satiety and nutrition through the entire day. Recommendations include key foods to eat during breakfast to help stabilize blood sugar levels and to prevent overeating at meals later in the day. Planning ahead is also a key component.  Cooking - Human resources officer conducted group or individual video education with verbal and written material and guidebook.  Patient learns eating strategies to improve overall health, including an approach to cook more at home. Recommendations include thinking of animal protein as a side on your plate rather than center stage and focusing instead on lower calorie dense options like vegetables, fruits, whole grains, and plant-based proteins, such as beans. Making sauces in large quantities to freeze for later and leaving the skin on your vegetables are also recommended to maximize your experience.  Cooking - Healthy Salads and Dressing Clinical staff conducted group or individual video education with  verbal and written material and guidebook.  Patient learns  that vegetables, fruits, whole grains, and legumes are the foundations of the [place]. Recommendations include how to incorporate each of these in flavorful and healthy salads, and how to create homemade salad dressings. Proper handling of ingredients is also covered. Cooking - Soups and [employer] - Soups and Desserts Clinical staff conducted group or individual video education with verbal and written material and guidebook.  Patient learns that Pritikin soups and desserts make for easy, nutritious, and delicious snacks and meal components that are low in sodium, fat, sugar, and calorie density, while high in vitamins, minerals, and filling fiber. Recommendations include simple and healthy ideas for soups and desserts.   Overview     The Pritikin Solution Program Overview Clinical staff conducted group or individual video education with verbal and written material and guidebook.  Patient learns that the results of the [place] Program have been documented in more than 100 articles published in peer-reviewed journals, and the benefits include reducing risk factors for (and, in some cases, even reversing) high cholesterol, high blood pressure, type 2 diabetes, obesity, and more! An overview of the three key pillars of the Pritikin Program will be covered: eating well, doing regular exercise, and having a healthy mind-set.  WORKSHOPS  Exercise: Exercise Basics: Building Your Action Plan Clinical staff led group instruction and group discussion with PowerPoint presentation and patient guidebook. To enhance the learning environment the use of posters, models and videos may be added. At the conclusion of this workshop, patients will comprehend the difference between physical activity and exercise, as well as the benefits of incorporating both, into their routine. Patients will understand the FITT (Frequency, Intensity, Time,  and Type) principle and how to use it to build an exercise action plan. In addition, safety concerns and other considerations for exercise and cardiac rehab will be addressed by the presenter. The purpose of this lesson is to promote a comprehensive and effective weekly exercise routine in order to improve patients' overall level of fitness.   Managing Heart Disease: Your Path to a Healthier Heart Clinical staff led group instruction and group discussion with PowerPoint presentation and patient guidebook. To enhance the learning environment the use of posters, models and videos may be added.At the conclusion of this workshop, patients will understand the anatomy and physiology of the heart. Additionally, they will understand how Pritikin's three pillars impact the risk factors, the progression, and the management of heart disease.  The purpose of this lesson is to provide a high-level overview of the heart, heart disease, and how the Pritikin lifestyle positively impacts risk factors.  Exercise Biomechanics Clinical staff led group instruction and group discussion with PowerPoint presentation and patient guidebook. To enhance the learning environment the use of posters, models and videos may be added. Patients will learn how the structural parts of their bodies function and how these functions impact their daily activities, movement, and exercise. Patients will learn how to promote a neutral spine, learn how to manage pain, and identify ways to improve their physical movement in order to promote healthy living. The purpose of this lesson is to expose patients to common physical limitations that impact physical activity. Participants will learn practical ways to adapt and manage aches and pains, and to minimize their effect on regular exercise. Patients will learn how to maintain good posture while sitting, walking, and lifting.  Balance Training and Fall Prevention  Clinical staff led group  instruction and group discussion with PowerPoint presentation and patient guidebook. To enhance  the learning environment the use of posters, models and videos may be added. At the conclusion of this workshop, patients will understand the importance of their sensorimotor skills (vision, proprioception, and the vestibular system) in maintaining their ability to balance as they age. Patients will apply a variety of balancing exercises that are appropriate for their current level of function. Patients will understand the common causes for poor balance, possible solutions to these problems, and ways to modify their physical environment in order to minimize their fall risk. The purpose of this lesson is to teach patients about the importance of maintaining balance as they age and ways to minimize their risk of falling.  WORKSHOPS   Nutrition:  Fueling a Scientist, research (physical sciences) led group instruction and group discussion with PowerPoint presentation and patient guidebook. To enhance the learning environment the use of posters, models and videos may be added. Patients will review the foundational principles of the [place] and understand what constitutes a serving size in each of the food groups. Patients will also learn Pritikin-friendly foods that are better choices when away from home and review make-ahead meal and snack options. Calorie density will be reviewed and applied to three nutrition priorities: weight maintenance, weight loss, and weight gain. The purpose of this lesson is to reinforce (in a group setting) the key concepts around what patients are recommended to eat and how to apply these guidelines when away from home by planning and selecting Pritikin-friendly options. Patients will understand how calorie density may be adjusted for different weight management goals.  Mindful Eating  Clinical staff led group instruction and group discussion with PowerPoint presentation and patient  guidebook. To enhance the learning environment the use of posters, models and videos may be added. Patients will briefly review the concepts of the [place] and the importance of low-calorie dense foods. The concept of mindful eating will be introduced as well as the importance of paying attention to internal hunger signals. Triggers for non-hunger eating and techniques for dealing with triggers will be explored. The purpose of this lesson is to provide patients with the opportunity to review the basic principles of the [place], discuss the value of eating mindfully and how to measure internal cues of hunger and fullness using the Hunger Scale. Patients will also discuss reasons for non-hunger eating and learn strategies to use for controlling emotional eating.  Targeting Your Nutrition Priorities Clinical staff led group instruction and group discussion with PowerPoint presentation and patient guidebook. To enhance the learning environment the use of posters, models and videos may be added. Patients will learn how to determine their genetic susceptibility to disease by reviewing their family history. Patients will gain insight into the importance of diet as part of an overall healthy lifestyle in mitigating the impact of genetics and other environmental insults. The purpose of this lesson is to provide patients with the opportunity to assess their personal nutrition priorities by looking at their family history, their own health history and current risk factors. Patients will also be able to discuss ways of prioritizing and modifying the [place] for their highest risk areas  Menu  Clinical staff led group instruction and group discussion with PowerPoint presentation and patient guidebook. To enhance the learning environment the use of posters, models and videos may be added. Using menus brought in from [employer], or printed from [employer], patients will apply  the [place] dining out guidelines that were presented in the Pritikin  Dining Out educational video. Patients will also be able to practice these guidelines in a variety of provided scenarios. The purpose of this lesson is to provide patients with the opportunity to practice hands-on learning of the [place] with actual menus and practice scenarios.  Label Reading Clinical staff led group instruction and group discussion with PowerPoint presentation and patient guidebook. To enhance the learning environment the use of posters, models and videos may be added. Patients will review and discuss the Pritikin label reading guidelines presented in Pritikin's Label Reading Educational series video. Using fool labels brought in from local grocery stores and markets, patients will apply the label reading guidelines and determine if the packaged food meet the Pritikin guidelines. The purpose of this lesson is to provide patients with the opportunity to review, discuss, and practice hands-on learning of the Pritikin Label Reading guidelines with actual packaged food labels. [place] Workshops are designed to teach patients ways to prepare quick, simple, and affordable recipes at home. The importance of nutrition's role in chronic disease risk reduction is reflected in its emphasis in the overall Pritikin program. By learning how to prepare essential core Pritikin Eating Plan recipes, patients will increase control over what they eat; be able to customize the flavor of foods without the use of added salt, sugar, or fat; and improve the quality of the food they consume. By learning a set of core recipes which are easily assembled, quickly prepared, and affordable, patients are more likely to prepare more healthy foods at home. These workshops focus on convenient breakfasts, simple entres, side dishes, and desserts which can be prepared with minimal effort and are  consistent with nutrition recommendations for cardiovascular risk reduction. Cooking [employer] are taught by a Engineer, materials (RD) who has been trained by the [employer]. The chef or RD has a clear understanding of the importance of minimizing - if not completely eliminating - added fat, sugar, and sodium in recipes. Throughout the series of [place] Workshop sessions, patients will learn about healthy ingredients and efficient methods of cooking to build confidence in their capability to prepare    Cooking School weekly topics:  Adding Flavor- Sodium-Free  Fast and Healthy Breakfasts  Powerhouse Plant-Based Proteins  Satisfying Salads and Dressings  Simple Sides and Sauces  International Cuisine-Spotlight on the [place] Zones  Delicious Desserts  Savory Soups  Efficiency Cooking - Meals in a Snap  Tasty Appetizers and Snacks  Comforting Weekend Breakfasts  One-Pot Wonders   Fast Evening Meals  Easy [place] (Psychosocial): New Thoughts, New Behaviors Clinical staff led group instruction and group discussion with PowerPoint presentation and patient guidebook. To enhance the learning environment the use of posters, models and videos may be added. Patients will learn and practice techniques for developing effective health and lifestyle goals. Patients will be able to effectively apply the goal setting process learned to develop at least one new personal goal.  The purpose of this lesson is to expose patients to a new skill set of behavior modification techniques such as techniques setting SMART goals, overcoming barriers, and achieving new thoughts and new behaviors.  Managing Moods and Relationships Clinical staff led group instruction and group discussion with PowerPoint presentation and patient guidebook. To enhance the learning environment the use of posters, models and videos may  be added. Patients will learn how emotional and chronic stress factors  can impact their health and relationships. They will learn healthy ways to manage their moods and utilize positive coping mechanisms. In addition, ICR patients will learn ways to improve communication skills. The purpose of this lesson is to expose patients to ways of understanding how one's mood and health are intimately connected. Developing a healthy outlook can help build positive relationships and connections with others. Patients will understand the importance of utilizing effective communication skills that include actively listening and being heard. They will learn and understand the importance of the "4 Cs" and especially Connections in fostering of a Healthy Mind-Set.  Healthy Sleep for a Healthy Heart Clinical staff led group instruction and group discussion with PowerPoint presentation and patient guidebook. To enhance the learning environment the use of posters, models and videos may be added. At the conclusion of this workshop, patients will be able to demonstrate knowledge of the importance of sleep to overall health, well-being, and quality of life. They will understand the symptoms of, and treatments for, common sleep disorders. Patients will also be able to identify daytime and nighttime behaviors which impact sleep, and they will be able to apply these tools to help manage sleep-related challenges. The purpose of this lesson is to provide patients with a general overview of sleep and outline the importance of quality sleep. Patients will learn about a few of the most common sleep disorders. Patients will also be introduced to the concept of "sleep hygiene," and discover ways to self-manage certain sleeping problems through simple daily behavior changes. Finally, the workshop will motivate patients by clarifying the links between quality sleep and their goals of heart-healthy living.   Recognizing and Reducing  Stress Clinical staff led group instruction and group discussion with PowerPoint presentation and patient guidebook. To enhance the learning environment the use of posters, models and videos may be added. At the conclusion of this workshop, patients will be able to understand the types of stress reactions, differentiate between acute and chronic stress, and recognize the impact that chronic stress has on their health. They will also be able to apply different coping mechanisms, such as reframing negative self-talk. Patients will have the opportunity to practice a variety of stress management techniques, such as deep abdominal breathing, progressive muscle relaxation, and/or guided imagery.  The purpose of this lesson is to educate patients on the role of stress in their lives and to provide healthy techniques for coping with it.  Learning Barriers/Preferences:  Learning Barriers/Preferences - 08/27/22 3614       Learning Barriers/Preferences   Learning Barriers Sight;Hearing   wears reading glasses, HOH, waiting for bilatreral hearing aids   Learning Preferences Skilled Demonstration;Video;Pictoral;Individual Instruction;Group Instruction             Education Topics:  Knowledge Questionnaire Score:  Knowledge Questionnaire Score - 08/27/22 0939       Knowledge Questionnaire Score   Pre Score 22/24             Core Components/Risk Factors/Patient Goals at Admission:  Personal Goals and Risk Factors at Admission - 08/27/22 0939       Core Components/Risk Factors/Patient Goals on Admission    Weight Management Yes;Obesity;Weight Loss    Intervention Weight Management: Develop a combined nutrition and exercise program designed to reach desired caloric intake, while maintaining appropriate intake of nutrient and fiber, sodium and fats, and appropriate energy expenditure required for the weight goal.;Weight Management: Provide education and appropriate resources to help participant  work on and attain dietary goals.;Weight  Management/Obesity: Establish reasonable short term and long term weight goals.;Obesity: Provide education and appropriate resources to help participant work on and attain dietary goals.    Goal Weight: Long Term 220 lb (99.8 kg)    Expected Outcomes Short Term: Continue to assess and modify interventions until short term weight is achieved;Long Term: Adherence to nutrition and physical activity/exercise program aimed toward attainment of established weight goal;Weight Maintenance: Understanding of the daily nutrition guidelines, which includes 25-35% calories from fat, 7% or less cal from saturated fats, less than 212m cholesterol, less than 1.5gm of sodium, & 5 or more servings of fruits and vegetables daily;Weight Loss: Understanding of general recommendations for a balanced deficit meal plan, which promotes 1-2 lb weight loss per week and includes a negative energy balance of [phone] kcal/d;Understanding recommendations for meals to include 15-35% energy as protein, 25-35% energy from fat, 35-60% energy from carbohydrates, less than 2070mof dietary cholesterol, 20-35 gm of total fiber daily;Understanding of distribution of calorie intake throughout the day with the consumption of 4-5 meals/snacks    Hypertension Yes    Intervention Provide education on lifestyle modifcations including regular physical activity/exercise, weight management, moderate sodium restriction and increased consumption of fresh fruit, vegetables, and low fat dairy, alcohol moderation, and smoking cessation.;Monitor prescription use compliance.    Expected Outcomes Short Term: Continued assessment and intervention until BP is < 140/907mG in hypertensive participants. < 130/12m72m in hypertensive participants with diabetes, heart failure or chronic kidney disease.;Long Term: Maintenance of blood pressure at goal levels.    Lipids Yes    Intervention Provide education and support for  participant on nutrition & aerobic/resistive exercise along with prescribed medications to achieve LDL <70mg62mL >40mg.21mExpected Outcomes Short Term: Participant states understanding of desired cholesterol values and is compliant with medications prescribed. Participant is following exercise prescription and nutrition guidelines.;Long Term: Cholesterol controlled with medications as prescribed, with individualized exercise RX and with personalized nutrition plan. Value goals: LDL < 70mg, 39m> 40 mg.             Core Components/Risk Factors/Patient Goals Review:   Goals and Risk Factor Review     Row Name 09/10/22 1534 10/04/22 1153 11/05/22 1209         Core Components/Risk Factors/Patient Goals Review   Personal Goals Review Weight Management/Obesity;Hypertension;Lipids Weight Management/Obesity;Hypertension;Lipids Weight Management/Obesity;Hypertension;Lipids     Review Aceton Xaned intensive cardiac rehab on 09/09/22 and did well with exercise. vital signs were stable Bonifacio Sevrinen doing  did well with exercise. vital signs have been stable.Camille Hektorst 2.5 kg since starting intensive cardiac rehab. Diego Abeeren doing  did well with exercise. vital signs have been stable.Jonatan Estephanst 1.9 kg since starting intensive cardiac rehab.     Expected Outcomes Rithwik Doralontinue to participate in intensive cardiac rehab for exercise, nutrition and lifestyle modifications Quang Jamesrobertontinue to participate in intensive cardiac rehab for exercise, nutrition and lifestyle modifications Kenon Aldricontinue to participate in intensive cardiac rehab for exercise, nutrition and lifestyle modifications              Core Components/Risk Factors/Patient Goals at Discharge (Final Review):   Goals and Risk Factor Review - 11/05/22 1209       Core Components/Risk Factors/Patient Goals Review   Personal Goals Review Weight Management/Obesity;Hypertension;Lipids    Review Jermine Terieen doing  did well with exercise. vital signs have been stable.Aadon Burylst 1.9 kg since starting intensive  cardiac rehab.    Expected Outcomes Haakon will continue to participate in intensive cardiac rehab for exercise, nutrition and lifestyle modifications             ITP Comments:  ITP Comments     Row Name 08/27/22 0834 09/10/22 1531 10/04/22 1007 11/05/22 1206     ITP Comments Dr Fransico Him MD, Medical Director, Introduction to [place] Education Program/ Intensive Cardiac Rehab. Initial Orientation Packet Reviewed with the patient Kule started intensive cardiac rehab on 09/09/22 and did well with exercise. 30 day ITP Review. Jayvin has good attendance and participation in intensive cardiac rehab 30 day ITP Review. Zeeshan continues to have good attendance and participation in intensive cardiac rehab             Comments: See ITP comments.Harrell Gave RN BSN

## 2022-11-06 ENCOUNTER — Encounter (HOSPITAL_COMMUNITY)
Admission: RE | Admit: 2022-11-06 | Discharge: 2022-11-06 | Disposition: A | Discharge status: Home / Self Care | Payer: Medicare Other | Source: Ambulatory Visit | Attending: Interventional Cardiology | Admitting: Interventional Cardiology

## 2022-11-06 DIAGNOSIS — I10 Essential (primary) hypertension: Secondary | ICD-10-CM | POA: Insufficient documentation

## 2022-11-06 DIAGNOSIS — E785 Hyperlipidemia, unspecified: Secondary | ICD-10-CM | POA: Diagnosis not present

## 2022-11-06 DIAGNOSIS — Z6835 Body mass index (BMI) 35.0-35.9, adult: Secondary | ICD-10-CM | POA: Diagnosis not present

## 2022-11-06 DIAGNOSIS — E669 Obesity, unspecified: Secondary | ICD-10-CM | POA: Insufficient documentation

## 2022-11-06 DIAGNOSIS — Z48812 Encounter for surgical aftercare following surgery on the circulatory system: Secondary | ICD-10-CM | POA: Insufficient documentation

## 2022-11-06 DIAGNOSIS — Z9889 Other specified postprocedural states: Secondary | ICD-10-CM | POA: Insufficient documentation

## 2022-11-08 ENCOUNTER — Encounter (HOSPITAL_COMMUNITY)
Admission: RE | Admit: 2022-11-08 | Discharge: 2022-11-08 | Disposition: A | Discharge status: Home / Self Care | Payer: Medicare Other | Source: Ambulatory Visit | Attending: Interventional Cardiology | Admitting: Interventional Cardiology

## 2022-11-08 DIAGNOSIS — I10 Essential (primary) hypertension: Secondary | ICD-10-CM | POA: Diagnosis not present

## 2022-11-08 DIAGNOSIS — E785 Hyperlipidemia, unspecified: Secondary | ICD-10-CM | POA: Diagnosis not present

## 2022-11-08 DIAGNOSIS — E669 Obesity, unspecified: Secondary | ICD-10-CM | POA: Diagnosis not present

## 2022-11-08 DIAGNOSIS — Z48812 Encounter for surgical aftercare following surgery on the circulatory system: Secondary | ICD-10-CM | POA: Diagnosis not present

## 2022-11-08 DIAGNOSIS — Z9889 Other specified postprocedural states: Secondary | ICD-10-CM | POA: Diagnosis not present

## 2022-11-08 DIAGNOSIS — Z6835 Body mass index (BMI) 35.0-35.9, adult: Secondary | ICD-10-CM | POA: Diagnosis not present

## 2022-11-11 ENCOUNTER — Encounter (HOSPITAL_COMMUNITY)
Admission: RE | Admit: 2022-11-11 | Discharge: 2022-11-11 | Disposition: A | Discharge status: Home / Self Care | Payer: Medicare Other | Source: Ambulatory Visit | Attending: Interventional Cardiology | Admitting: Interventional Cardiology

## 2022-11-11 DIAGNOSIS — E669 Obesity, unspecified: Secondary | ICD-10-CM | POA: Diagnosis not present

## 2022-11-11 DIAGNOSIS — I10 Essential (primary) hypertension: Secondary | ICD-10-CM | POA: Diagnosis not present

## 2022-11-11 DIAGNOSIS — Z9889 Other specified postprocedural states: Secondary | ICD-10-CM | POA: Diagnosis not present

## 2022-11-11 DIAGNOSIS — Z6835 Body mass index (BMI) 35.0-35.9, adult: Secondary | ICD-10-CM | POA: Diagnosis not present

## 2022-11-11 DIAGNOSIS — Z48812 Encounter for surgical aftercare following surgery on the circulatory system: Secondary | ICD-10-CM | POA: Diagnosis not present

## 2022-11-11 DIAGNOSIS — E785 Hyperlipidemia, unspecified: Secondary | ICD-10-CM | POA: Diagnosis not present

## 2022-11-12 ENCOUNTER — Ambulatory Visit: Payer: Medicare Other | Admitting: Family Medicine

## 2022-11-13 ENCOUNTER — Ambulatory Visit (INDEPENDENT_AMBULATORY_CARE_PROVIDER_SITE_OTHER): Payer: Medicare Other | Admitting: Family Medicine

## 2022-11-13 ENCOUNTER — Encounter: Payer: Self-pay | Admitting: Family Medicine

## 2022-11-13 ENCOUNTER — Encounter (HOSPITAL_COMMUNITY)
Admission: RE | Admit: 2022-11-13 | Discharge: 2022-11-13 | Disposition: A | Discharge status: Home / Self Care | Payer: Medicare Other | Source: Ambulatory Visit | Attending: Interventional Cardiology | Admitting: Interventional Cardiology

## 2022-11-13 VITALS — BP 112/80 | HR 75 | Temp 97.8°F | Wt 241.0 lb

## 2022-11-13 DIAGNOSIS — Z48812 Encounter for surgical aftercare following surgery on the circulatory system: Secondary | ICD-10-CM | POA: Diagnosis not present

## 2022-11-13 DIAGNOSIS — F1190 Opioid use, unspecified, uncomplicated: Secondary | ICD-10-CM

## 2022-11-13 DIAGNOSIS — E669 Obesity, unspecified: Secondary | ICD-10-CM | POA: Diagnosis not present

## 2022-11-13 DIAGNOSIS — E785 Hyperlipidemia, unspecified: Secondary | ICD-10-CM | POA: Diagnosis not present

## 2022-11-13 DIAGNOSIS — M159 Polyosteoarthritis, unspecified: Secondary | ICD-10-CM

## 2022-11-13 DIAGNOSIS — Z9889 Other specified postprocedural states: Secondary | ICD-10-CM | POA: Diagnosis not present

## 2022-11-13 DIAGNOSIS — I10 Essential (primary) hypertension: Secondary | ICD-10-CM | POA: Diagnosis not present

## 2022-11-13 DIAGNOSIS — Z6835 Body mass index (BMI) 35.0-35.9, adult: Secondary | ICD-10-CM | POA: Diagnosis not present

## 2022-11-13 MED ORDER — OXYCODONE-ACETAMINOPHEN 10-325 MG PO TABS: 1.0000 | ORAL_TABLET | Freq: Four times a day (QID) | ORAL | 0 refills | Status: AC | PRN

## 2022-11-13 MED ORDER — OXYCODONE-ACETAMINOPHEN 10-325 MG PO TABS: 1.0000 | ORAL_TABLET | Freq: Four times a day (QID) | ORAL | 0 refills | Status: DC | PRN

## 2022-11-13 NOTE — Progress Notes (Signed)
   Subjective:    Patient ID: Ricky Meyers, male    DOB: 02-26-1946, 77 y.o.   MRN: [id number]  HPI Here for pain management. He is doing very well. He is working hard in cardiac rehab, and he has lost 5 lbs in the past 2 months. Of course losing weight helps his OA pain.    Review of Systems  Constitutional: Negative.        Objective:   Physical Exam Constitutional:      Appearance: Normal appearance.  Neurological:     Mental Status: He is alert.           Assessment & Plan:  Pain management. Indication for chronic opioid: OA Medication and dose: Percocet 10-325 # pills per month: 120 Last UDS date: 11-13-22 Opioid Treatment Agreement signed (Y/N): 01-15-19 Opioid Treatment Agreement last reviewed with patient:  11-13-22 [facility] reviewed this encounter (include red flags): Yes Meds were refilled.  Alysia Penna, MD

## 2022-11-15 ENCOUNTER — Encounter (HOSPITAL_COMMUNITY)
Admission: RE | Admit: 2022-11-15 | Discharge: 2022-11-15 | Disposition: A | Discharge status: Home / Self Care | Payer: Medicare Other | Source: Ambulatory Visit | Attending: Interventional Cardiology | Admitting: Interventional Cardiology

## 2022-11-15 DIAGNOSIS — I10 Essential (primary) hypertension: Secondary | ICD-10-CM | POA: Diagnosis not present

## 2022-11-15 DIAGNOSIS — Z9889 Other specified postprocedural states: Secondary | ICD-10-CM | POA: Diagnosis not present

## 2022-11-15 DIAGNOSIS — E669 Obesity, unspecified: Secondary | ICD-10-CM | POA: Diagnosis not present

## 2022-11-15 DIAGNOSIS — Z48812 Encounter for surgical aftercare following surgery on the circulatory system: Secondary | ICD-10-CM | POA: Diagnosis not present

## 2022-11-15 DIAGNOSIS — Z6835 Body mass index (BMI) 35.0-35.9, adult: Secondary | ICD-10-CM | POA: Diagnosis not present

## 2022-11-15 DIAGNOSIS — E785 Hyperlipidemia, unspecified: Secondary | ICD-10-CM | POA: Diagnosis not present

## 2022-11-17 LAB — DRUG MONITOR, PANEL 1, W/CONF, URINE
Amphetamines: NEGATIVE ng/mL (ref <500)
Barbiturates: NEGATIVE ng/mL (ref <300)
Benzodiazepines: NEGATIVE ng/mL (ref <100)
Cocaine Metabolite: NEGATIVE ng/mL (ref <150)
Codeine: NEGATIVE ng/mL (ref <50)
Creatinine: 76.2 mg/dL (ref >=20.0)
Hydrocodone: NEGATIVE ng/mL (ref <50)
Hydromorphone: NEGATIVE ng/mL (ref <50)
Marijuana Metabolite: NEGATIVE ng/mL (ref <20)
Methadone Metabolite: NEGATIVE ng/mL (ref <100)
Morphine: NEGATIVE ng/mL (ref <50)
Norhydrocodone: NEGATIVE ng/mL (ref <50)
Noroxycodone: 4458 ng/mL — ABNORMAL HIGH (ref <50)
Opiates: NEGATIVE ng/mL (ref <100)
Oxidant: NEGATIVE ug/mL (ref <200)
Oxycodone: 1592 ng/mL — ABNORMAL HIGH (ref <50)
Oxycodone: POSITIVE ng/mL — AB (ref <100)
Oxymorphone: 333 ng/mL — ABNORMAL HIGH (ref <50)
Phencyclidine: NEGATIVE ng/mL (ref <25)
pH: 6.2 (ref 4.5–9.0)

## 2022-11-17 LAB — DM TEMPLATE

## 2022-11-18 ENCOUNTER — Ambulatory Visit: Payer: Medicare Other

## 2022-11-18 ENCOUNTER — Ambulatory Visit (INDEPENDENT_AMBULATORY_CARE_PROVIDER_SITE_OTHER): Payer: Medicare Other

## 2022-11-18 ENCOUNTER — Encounter (HOSPITAL_COMMUNITY): Payer: Medicare Other

## 2022-11-18 VITALS — Ht 73.0 in | Wt 241.0 lb

## 2022-11-18 DIAGNOSIS — Z0000 Encounter for general adult medical examination without abnormal findings: Secondary | ICD-10-CM

## 2022-11-18 NOTE — Patient Instructions (Addendum)
[name] , Thank you for taking time to come for your Medicare Wellness Visit. I appreciate your ongoing commitment to your health goals. Please review the following plan we discussed and let me know if I can assist you in the future.   These are the goals we discussed:  Goals       Patient Stated (pt-stated)      Lose weight.      Patient Stated      11/12/2021, wants to lose 20-30 pounds        This is a list of the screening recommended for you and due dates:  Health Maintenance  Topic Date Due   COVID-19 Vaccine (4 - 2023-24 season) 12/04/2022*   Hepatitis C Screening: USPSTF Recommendation to screen - Ages 18-79 yo.  11/19/2023*   Medicare Annual Wellness Visit  11/19/2023   DTaP/Tdap/Td vaccine (3 - Td or Tdap) 07/18/2028   Pneumonia Vaccine  Completed   Flu Shot  Completed   Zoster (Shingles) Vaccine  Completed   HPV Vaccine  Aged Out   Colon Cancer Screening  Discontinued  *Topic was postponed. The date shown is not the original due date.    Advanced directives: Please bring a copy of your health care power of attorney and living will to the office to be added to your chart at your convenience.   Conditions/risks identified: None  Next appointment: Follow up in one year for your annual wellness visit.    Preventive Care 33 Years and Older, Male  Preventive care refers to lifestyle choices and visits with your health care provider that can promote health and wellness. What does preventive care include? A yearly physical exam. This is also called an annual well check. Dental exams once or twice a year. Routine eye exams. Ask your health care provider how often you should have your eyes checked. Personal lifestyle choices, including: Daily care of your teeth and gums. Regular physical activity. Eating a healthy diet. Avoiding tobacco and drug use. Limiting alcohol use. Practicing safe sex. Taking low doses of aspirin every day. Taking vitamin and mineral  supplements as recommended by your health care provider. What happens during an annual well check? The services and screenings done by your health care provider during your annual well check will depend on your age, overall health, lifestyle risk factors, and family history of disease. Counseling  Your health care provider may ask you questions about your: Alcohol use. Tobacco use. Drug use. Emotional well-being. Home and relationship well-being. Sexual activity. Eating habits. History of falls. Memory and ability to understand (cognition). Work and work Statistician. Screening  You may have the following tests or measurements: Height, weight, and BMI. Blood pressure. Lipid and cholesterol levels. These may be checked every 5 years, or more frequently if you are over 27 years old. Skin check. Lung cancer screening. You may have this screening every year starting at age 40 if you have a 30-pack-year history of smoking and currently smoke or have quit within the past 15 years. Fecal occult blood test (FOBT) of the stool. You may have this test every year starting at age 13. Flexible sigmoidoscopy or colonoscopy. You may have a sigmoidoscopy every 5 years or a colonoscopy every 10 years starting at age 41. Prostate cancer screening. Recommendations will vary depending on your family history and other risks. Hepatitis C blood test. Hepatitis B blood test. Sexually transmitted disease (STD) testing. Diabetes screening. This is done by checking your blood sugar (glucose) after  you have not eaten for a while (fasting). You may have this done every 1-3 years. Abdominal aortic aneurysm (AAA) screening. You may need this if you are a current or former smoker. Osteoporosis. You may be screened starting at age 14 if you are at high risk. Talk with your health care provider about your test results, treatment options, and if necessary, the need for more tests. Vaccines  Your health care provider  may recommend certain vaccines, such as: Influenza vaccine. This is recommended every year. Tetanus, diphtheria, and acellular pertussis (Tdap, Td) vaccine. You may need a Td booster every 10 years. Zoster vaccine. You may need this after age 89. Pneumococcal 13-valent conjugate (PCV13) vaccine. One dose is recommended after age 85. Pneumococcal polysaccharide (PPSV23) vaccine. One dose is recommended after age 37. Talk to your health care provider about which screenings and vaccines you need and how often you need them. This information is not intended to replace advice given to you by your health care provider. Make sure you discuss any questions you have with your health care provider. Document Released: 11/17/2015 Document Revised: 07/10/2016 Document Reviewed: 08/22/2015 Elsevier Interactive Patient Education  2017 [place] Prevention in the Home Falls can cause injuries. They can happen to people of all ages. There are many things you can do to make your home safe and to help prevent falls. What can I do on the outside of my home? Regularly fix the edges of walkways and driveways and fix any cracks. Remove anything that might make you trip as you walk through a door, such as a raised step or threshold. Trim any bushes or trees on the path to your home. Use bright outdoor lighting. Clear any walking paths of anything that might make someone trip, such as rocks or tools. Regularly check to see if handrails are loose or broken. Make sure that both sides of any steps have handrails. Any raised decks and porches should have guardrails on the edges. Have any leaves, snow, or ice cleared regularly. Use sand or salt on walking paths during winter. Clean up any spills in your garage right away. This includes oil or grease spills. What can I do in the bathroom? Use night lights. Install grab bars by the toilet and in the tub and shower. Do not use towel bars as grab bars. Use  non-skid mats or decals in the tub or shower. If you need to sit down in the shower, use a plastic, non-slip stool. Keep the floor dry. Clean up any water that spills on the floor as soon as it happens. Remove soap buildup in the tub or shower regularly. Attach bath mats securely with double-sided non-slip rug tape. Do not have throw rugs and other things on the floor that can make you trip. What can I do in the bedroom? Use night lights. Make sure that you have a light by your bed that is easy to reach. Do not use any sheets or blankets that are too big for your bed. They should not hang down onto the floor. Have a firm chair that has side arms. You can use this for support while you get dressed. Do not have throw rugs and other things on the floor that can make you trip. What can I do in the kitchen? Clean up any spills right away. Avoid walking on wet floors. Keep items that you use a lot in easy-to-reach places. If you need to reach something above you, use a strong  step stool that has a grab bar. Keep electrical cords out of the way. Do not use floor polish or wax that makes floors slippery. If you must use wax, use non-skid floor wax. Do not have throw rugs and other things on the floor that can make you trip. What can I do with my stairs? Do not leave any items on the stairs. Make sure that there are handrails on both sides of the stairs and use them. Fix handrails that are broken or loose. Make sure that handrails are as long as the stairways. Check any carpeting to make sure that it is firmly attached to the stairs. Fix any carpet that is loose or worn. Avoid having throw rugs at the top or bottom of the stairs. If you do have throw rugs, attach them to the floor with carpet tape. Make sure that you have a light switch at the top of the stairs and the bottom of the stairs. If you do not have them, ask someone to add them for you. What else can I do to help prevent falls? Wear  shoes that: Do not have high heels. Have rubber bottoms. Are comfortable and fit you well. Are closed at the toe. Do not wear sandals. If you use a stepladder: Make sure that it is fully opened. Do not climb a closed stepladder. Make sure that both sides of the stepladder are locked into place. Ask someone to hold it for you, if possible. Clearly mark and make sure that you can see: Any grab bars or handrails. First and last steps. Where the edge of each step is. Use tools that help you move around (mobility aids) if they are needed. These include: Canes. Walkers. Scooters. Crutches. Turn on the lights when you go into a dark area. Replace any light bulbs as soon as they burn out. Set up your furniture so you have a clear path. Avoid moving your furniture around. If any of your floors are uneven, fix them. If there are any pets around you, be aware of where they are. Review your medicines with your doctor. Some medicines can make you feel dizzy. This can increase your chance of falling. Ask your doctor what other things that you can do to help prevent falls. This information is not intended to replace advice given to you by your health care provider. Make sure you discuss any questions you have with your health care provider. Document Released: 08/17/2009 Document Revised: 03/28/2016 Document Reviewed: 11/25/2014 Elsevier Interactive Patient Education  2017 [employer].

## 2022-11-18 NOTE — Progress Notes (Signed)
Subjective:   Ricky Meyers is a 77 y.o. male who presents for Medicare Annual/Subsequent preventive examination.  Review of Systems    Virtual Visit via Telephone Note  I connected with  Ricky Meyers on 11/18/22 at  1:00 PM EST by telephone and verified that I am speaking with the correct person using two identifiers.  Location: Patient: Home Provider: Office Persons participating in the virtual visit: patient/Nurse Health Advisor   I discussed the limitations, risks, security and privacy concerns of performing an evaluation and management service by telephone and the availability of in person appointments. The patient expressed understanding and agreed to proceed.  Interactive audio and video telecommunications were attempted between this nurse and patient, however failed, due to patient having technical difficulties OR patient did not have access to video capability.  We continued and completed visit with audio only.  Some vital signs may be absent or patient reported.   Criselda Peaches, LPN  Cardiac Risk Factors include: advanced age (>54mn, >>82women);male gender;hypertension     Objective:    Today's Vitals   11/18/22 1247  Weight: 241 lb (109.3 kg)  Height: '6\' 1"'$  (1.854 m)  PainSc: 0-No pain   Body mass index is 31.8 kg/m.     11/18/2022   12:54 PM 05/27/2022    5:51 AM 05/15/2022   11:10 AM 01/24/2022    4:13 PM 01/22/2022    1:58 PM 01/01/2022   11:06 AM 12/20/2021    5:54 AM  Advanced Directives  Does Patient Have a Medical Advance Directive? Yes Yes Yes Yes Yes Yes Yes  Type of AParamedicof AScottsvilleLiving will HHardyLiving will Living will;Healthcare Power of Attorney Living will HMarienthalLiving will HColumbiavilleLiving will HManor CreekLiving will  Does patient want to make changes to medical advance directive?  No - Patient declined       Copy of  HBatesburg-Leesvillein Chart? No - copy requested No - copy requested    No - copy requested     Current Medications (verified) Outpatient Encounter Medications as of 11/18/2022  Medication Sig   allopurinol (ZYLOPRIM) 300 MG tablet TAKE 1 TABLET BY MOUTH EVERY DAY   amoxicillin (AMOXIL) 500 MG capsule Take 4 capsules (2,000 mg total) by mouth as directed. Take (4) capsules 1 hour before dental work   apixaban (ELIQUIS) 2.5 MG TABS tablet Take 1 tablet (2.5 mg total) by mouth 2 (two) times daily.   atorvastatin (LIPITOR) 40 MG tablet TAKE 1 TABLET BY MOUTH EVERY DAY   cyclobenzaprine (FLEXERIL) 10 MG tablet Take 1 tablet (10 mg total) by mouth 3 (three) times daily as needed for muscle spasms. (Patient taking differently: Take 10 mg by mouth See admin instructions. Take 1 tablet (10 mg) by mouth scheduled in the morning & take 1 tablet (10 mg) by mouth scheduled in the evening, may take an additional dose in in the afternoon if needed for spasms/pain.)   famotidine (PEPCID) 20 MG tablet Take 1 tablet (20 mg total) by mouth daily after supper.   gabapentin (NEURONTIN) 100 MG capsule Take 1 capsule (100 mg total) by mouth 2 (two) times daily.   Melatonin 12 MG TABS Take 12 mg by mouth at bedtime.   METAMUCIL FIBER PO Take 1 Dose by mouth See admin instructions. Take 1 dose by mouth (scheduled) daily, may take a second dose in the afternoon as needed for  constipation   [START ON 01/12/2023] oxyCODONE-acetaminophen (PERCOCET) 10-325 MG tablet Take 1 tablet by mouth every 6 (six) hours as needed for pain.   PARoxetine (PAXIL) 20 MG tablet Take 1 tablet (20 mg total) by mouth every morning.   telmisartan-hydrochlorothiazide (MICARDIS HCT) 80-25 MG tablet Take 1 tablet by mouth daily.   No facility-administered encounter medications on file as of 11/18/2022.    Allergies (verified) Patient has no known allergies.   History: Past Medical History:  Diagnosis Date   Arthritis    neck and  back    Chronic neck pain    Depression    ED (erectile dysfunction)    GERD (gastroesophageal reflux disease)    dysphagia   Gout    sees Dr. Leigh Aurora    Hyperlipidemia    Hypertension    OSA (obstructive sleep apnea) 12/18/2017   Severe with AHI at 70.1/hr and is now on CPAP at 11cm H2o   Prostate cancer [facility])    history of   Pulmonary embolism ([place]) 06/2019   S/P mitral valve clip implantation 01/24/2022   s/p Mitraclip XTW + NTW with Dr. Burt Knack and Dr. Ali Lowe   Sleep apnea    snoring/never checked   Past Surgical History:  Procedure Laterality Date   AMPUTATION Left 07/18/2018   Procedure: LEFT RING FINGER REVISION AMPUTATION;  Surgeon: Leanora Cover, MD;  Location: [place];  Service: Orthopedics;  Laterality: Left;   BACK SURGERY     CARDIAC CATHETERIZATION     CERVICAL FUSION  12/02/2007   per Dr. Sherley Bounds   COLONOSCOPY  03/11/2017   per Dr. Ardis Hughs, clear, no repeats needed    injection lower back Right 09/17/2016   IR IVC FILTER PLMT / S&I /IMG GUID/MOD SED  11/15/2021   IR IVC FILTER RETRIEVAL / S&I /IMG GUID/MOD SED  12/14/2021   IR RADIOLOGIST EVAL & MGMT  10/30/2021   LUMBAR LAMINECTOMY/DECOMPRESSION MICRODISCECTOMY Left 11/21/2021   Procedure: Laminectomy and Foraminotomy - left - Lumbar three-four;  Surgeon: [name], MD;  Location: [place];  Service: Neurosurgery;  Laterality: Left;   MITRAL VALVE REPAIR N/A 01/24/2022   Procedure: MITRAL VALVE REPAIR;  Surgeon: [name], MD;  Location: [place] [facility];  Service: Cardiovascular;  Laterality: N/A;   PROSTATECTOMY  2000   RIGHT/LEFT HEART CATH AND CORONARY ANGIOGRAPHY N/A 12/20/2021   Procedure: RIGHT/LEFT HEART CATH AND CORONARY ANGIOGRAPHY;  Surgeon: [name], MD;  Location: [place] [facility];  Service: Cardiovascular;  Laterality: N/A;   ROTATOR CUFF REPAIR Right 11/2017   TEE WITHOUT CARDIOVERSION N/A 01/01/2022   Procedure: TRANSESOPHAGEAL ECHOCARDIOGRAM (TEE);  Surgeon:  [name], MD;  Location: [address];  Service: Cardiovascular;  Laterality: N/A;   TEE WITHOUT CARDIOVERSION N/A 01/24/2022   Procedure: TRANSESOPHAGEAL ECHOCARDIOGRAM (TEE);  Surgeon: [name], MD;  Location: [place] [facility];  Service: Cardiovascular;  Laterality: N/A;   TONSILLECTOMY     as a child   TOTAL HIP ARTHROPLASTY Left 05/27/2022   Procedure: TOTAL HIP ARTHROPLASTY ANTERIOR APPROACH;  Surgeon: Dorna Leitz, MD;  Location: [facility];  Service: Orthopedics;  Laterality: Left;   VASECTOMY     Family History  Problem Relation Age of Onset   Cancer Other        breast, porstate   Hypertension Other    Stroke Other    Breast cancer Mother    Diverticulitis Mother    Heart disease Father    Prostate cancer  Father    Stroke Father    Dementia Sister    Colon cancer Neg Hx    Social History   Socioeconomic History   Marital status: Soil scientist    Spouse name: Not on file   Number of children: 2   Years of education: 14   Highest education level: Not on file  Occupational History   Occupation: retired  Tobacco Use   Smoking status: Never   Smokeless tobacco: Never  Vaping Use   Vaping Use: Never used  Substance and Sexual Activity   Alcohol use: Yes    Comment: 5 drinks/week   Drug use: Not Currently    Comment: oxycodone is prescribed   Sexual activity: Not on file  Other Topics Concern   Not on file  Social History Narrative   Not on file   Social Determinants of Health   Financial Resource Strain: Low Risk  (06/18/2022)   Overall Financial Resource Strain (CARDIA)    Difficulty of Paying Living Expenses: Not very hard  Food Insecurity: No Food Insecurity (11/18/2022)   Hunger Vital Sign    Worried About Running Out of Food in the Last Year: Never true    Ran Out of Food in the Last Year: Never true  Transportation Needs: No Transportation Needs (11/18/2022)   PRAPARE - Hydrologist (Medical): No    Lack of  Transportation (Non-Medical): No  Physical Activity: Sufficiently Active (11/18/2022)   Exercise Vital Sign    Days of Exercise per Week: 3 days    Minutes of Exercise per Session: 120 min  Stress: No Stress Concern Present (11/18/2022)   [place]    Feeling of Stress : Not at all  Social Connections: Moderately Integrated (11/18/2022)   Social Connection and Isolation Panel [NHANES]    Frequency of Communication with Friends and Family: More than three times a week    Frequency of Social Gatherings with Friends and Family: More than three times a week    Attends Religious Services: More than 4 times per year    Active Member of [employer] or Organizations: Yes    Attends Music therapist: More than 4 times per year    Marital Status: Divorced    Tobacco Counseling Counseling given: Not Answered   Clinical Intake:  Pre-visit preparation completed: No  Pain : No/denies pain Pain Score: 0-No pain     BMI - recorded: 31.8 Nutritional Status: BMI > 30  Obese Nutritional Risks: None Diabetes: No  How often do you need to have someone help you when you read instructions, pamphlets, or other written materials from your doctor or pharmacy?: 1 - Never  Diabetic?  No  Interpreter Needed?: No  Information entered by :: Rolene Arbour LPN   Activities of Daily Living    11/18/2022   12:53 PM 05/15/2022   11:13 AM  In your present state of health, do you have any difficulty performing the following activities:  Hearing? 1   Comment Wears hearing aids   Vision? 0   Difficulty concentrating or making decisions? 0   Walking or climbing stairs? 0   Dressing or bathing? 0   Doing errands, shopping? 0 0  Preparing Food and eating ? N   Using the Toilet? N   In the past six months, have you accidently leaked urine? N   Do you have problems with loss of bowel control? N  Managing your Medications? N    Managing your Finances? N   Housekeeping or managing your Housekeeping? N     Patient Care Team: [name], MD as PCP - General [name], MD as PCP - Cardiology (Cardiology) [name], MD as PCP - Structural Heart (Cardiology) Viona Gilmore, [facility] (Inactive) as Pharmacist (Pharmacist)  Indicate any recent Medical Services you may have received from other than [facility] providers in the past year (date may be approximate).     Assessment:   This is a routine wellness examination for Ricky Meyers.  Hearing/Vision screen Hearing Screening - Comments:: Wears hearing aids Vision Screening - Comments:: Wears rx glasses - up to date with routine eye exams with  Dr Pandora Leiter  Dietary issues and exercise activities discussed: Current Exercise Habits: Structured exercise class, Type of exercise: Other - see comments, Time (Minutes): > 60, Frequency (Times/Week): 3, Weekly Exercise (Minutes/Week): 0, Intensity: Mild, Exercise limited by: cardiac condition(s)   Goals Addressed               This Visit's Progress     Patient Stated (pt-stated)        Lose weight.       Depression Screen    11/18/2022   12:51 PM 11/13/2022    1:11 PM 09/12/2022   11:20 AM 08/27/2022    8:42 AM 08/26/2022   10:08 AM 02/15/2022    1:57 PM 11/12/2021   10:47 AM  PHQ 2/9 Scores  PHQ - 2 Score 0 0 0 0 0 0 0  PHQ- 9 Score 0 0 0  2 2     Fall Risk    11/18/2022   12:54 PM 11/13/2022    1:11 PM 09/12/2022   11:20 AM 08/27/2022    9:36 AM 08/26/2022   10:07 AM  Fall Risk   Falls in the past year? 0 0 1 0 0  Number falls in past yr: 0 0 0 0 0  Injury with Fall? 0 0 1 0 0  Comment   Knee injury    Risk for fall due to : No Fall Risks No Fall Risks No Fall Risks Impaired mobility No Fall Risks  Follow up Falls prevention discussed Falls evaluation completed Falls evaluation completed Falls evaluation completed Falls evaluation completed    [place]:  Any stairs in or around the home? Yes  If so, are there any without handrails? No  Home free of loose throw rugs in walkways, pet beds, electrical cords, etc? Yes  Adequate lighting in your home to reduce risk of falls? Yes   ASSISTIVE DEVICES UTILIZED TO PREVENT FALLS:  Life alert? No  Use of a cane, walker or w/c? No  Grab bars in the bathroom? No  Shower chair or bench in shower? No Elevated toilet seat or a handicapped toilet? Yes  TIMED UP AND GO:  Was the test performed? No . Audio visit  Cognitive Function:        11/18/2022   12:55 PM 11/12/2021   10:53 AM 11/08/2020    9:10 AM  6CIT Screen  What Year? 0 points 0 points 0 points  What month? 0 points 0 points 0 points  What time? 0 points 0 points   Count back from 20 0 points 0 points 0 points  Months in reverse 0 points 2 points 0 points  Repeat phrase 0 points 0 points 0 points  Total Score 0 points  2 points     Immunizations Immunization History  Administered Date(s) Administered   Fluad Quad(high Dose 65+) 08/22/2020, 07/30/2021, 08/26/2022   Influenza Split 09/05/2011, 11/09/2012   Influenza Whole 10/04/2009, 09/24/2010   Influenza, High Dose Seasonal PF 07/17/2015, 09/01/2017, 07/15/2018, 07/28/2019   Influenza,inj,Quad PF,6+ Mos 11/24/2013, 11/16/2014   Influenza-Unspecified 07/28/2019   PFIZER(Purple Top)SARS-COV-2 Vaccination 11/19/2019, 12/10/2019, 09/26/2020   Pneumococcal Conjugate-13 07/17/2015   Pneumococcal Polysaccharide-23 10/04/2009   Pneumococcal-Unspecified 09/03/2017   Td 03/10/2009   Tdap 07/18/2018   Zoster Recombinat (Shingrix) 06/05/2021, 08/21/2021   Zoster, Live 09/05/2011    TDAP status: Up to date  Flu Vaccine status: Up to date  Pneumococcal vaccine status: Up to date  Covid-19 vaccine status: Completed vaccines  Qualifies for Shingles Vaccine? Yes   Zostavax completed Yes   Shingrix Completed?: Yes  Screening Tests Health Maintenance  Topic Date Due    COVID-19 Vaccine (4 - 2023-24 season) 12/04/2022 (Originally 07/05/2022)   Hepatitis C Screening  11/19/2023 (Originally 01/24/1964)   Medicare Annual Wellness (AWV)  11/19/2023   DTaP/Tdap/Td (3 - Td or Tdap) 07/18/2028   Pneumonia Vaccine 23+ Years old  Completed   INFLUENZA VACCINE  Completed   Zoster Vaccines- Shingrix  Completed   HPV VACCINES  Aged Out   COLONOSCOPY (Pts 45-32yr Insurance coverage will need to be confirmed)  Discontinued    Health Maintenance  There are no preventive care reminders to display for this patient.   Colorectal cancer screening: No longer required.   Lung Cancer Screening: (Low Dose CT Chest recommended if Age 79-80years, 30 pack-year currently smoking OR have quit w/in 15years.) does not qualify.     Additional Screening:  Hepatitis C Screening: does qualify; Completed Deferred  Vision Screening: Recommended annual ophthalmology exams for early detection of glaucoma and other disorders of the eye. Is the patient up to date with their annual eye exam?  Yes  Who is the provider or what is the name of the office in which the patient attends annual eye exams? Dr HPandora LeiterIf pt is not established with a provider, would they like to be referred to a provider to establish care? No .   Dental Screening: Recommended annual dental exams for proper oral hygiene  Community Resource Referral / Chronic Care Management:  CRR required this visit?  No   CCM required this visit?  No      Plan:     I have personally reviewed and noted the following in the patient's chart:   Medical and social history Use of alcohol, tobacco or illicit drugs  Current medications and supplements including opioid prescriptions. Patient is not currently taking opioid prescriptions. Functional ability and status Nutritional status Physical activity Advanced directives List of other physicians Hospitalizations, surgeries, and ER visits in previous 12  months Vitals Screenings to include cognitive, depression, and falls Referrals and appointments  In addition, I have reviewed and discussed with patient certain preventive protocols, quality metrics, and best practice recommendations. A written personalized care plan for preventive services as well as general preventive health recommendations were provided to patient.     BCriselda Peaches LPN   16/31/4970  Nurse Notes: Patient due Hep-C Screening

## 2022-11-20 ENCOUNTER — Encounter (HOSPITAL_COMMUNITY)
Admission: RE | Admit: 2022-11-20 | Discharge: 2022-11-20 | Disposition: A | Discharge status: Home / Self Care | Payer: Medicare Other | Source: Ambulatory Visit | Attending: Interventional Cardiology | Admitting: Interventional Cardiology

## 2022-11-20 DIAGNOSIS — Z6835 Body mass index (BMI) 35.0-35.9, adult: Secondary | ICD-10-CM | POA: Diagnosis not present

## 2022-11-20 DIAGNOSIS — Z9889 Other specified postprocedural states: Secondary | ICD-10-CM

## 2022-11-20 DIAGNOSIS — E669 Obesity, unspecified: Secondary | ICD-10-CM | POA: Diagnosis not present

## 2022-11-20 DIAGNOSIS — Z48812 Encounter for surgical aftercare following surgery on the circulatory system: Secondary | ICD-10-CM | POA: Diagnosis not present

## 2022-11-20 DIAGNOSIS — I10 Essential (primary) hypertension: Secondary | ICD-10-CM | POA: Diagnosis not present

## 2022-11-20 DIAGNOSIS — E785 Hyperlipidemia, unspecified: Secondary | ICD-10-CM | POA: Diagnosis not present

## 2022-11-22 ENCOUNTER — Encounter (HOSPITAL_COMMUNITY)
Admission: RE | Admit: 2022-11-22 | Discharge: 2022-11-22 | Disposition: A | Discharge status: Home / Self Care | Payer: Medicare Other | Source: Ambulatory Visit | Attending: Interventional Cardiology | Admitting: Interventional Cardiology

## 2022-11-22 DIAGNOSIS — Z9889 Other specified postprocedural states: Secondary | ICD-10-CM

## 2022-11-22 DIAGNOSIS — E785 Hyperlipidemia, unspecified: Secondary | ICD-10-CM | POA: Diagnosis not present

## 2022-11-22 DIAGNOSIS — I10 Essential (primary) hypertension: Secondary | ICD-10-CM | POA: Diagnosis not present

## 2022-11-22 DIAGNOSIS — Z6835 Body mass index (BMI) 35.0-35.9, adult: Secondary | ICD-10-CM | POA: Diagnosis not present

## 2022-11-22 DIAGNOSIS — E669 Obesity, unspecified: Secondary | ICD-10-CM | POA: Diagnosis not present

## 2022-11-22 DIAGNOSIS — Z48812 Encounter for surgical aftercare following surgery on the circulatory system: Secondary | ICD-10-CM | POA: Diagnosis not present

## 2022-11-25 ENCOUNTER — Encounter (HOSPITAL_COMMUNITY)
Admission: RE | Admit: 2022-11-25 | Discharge: 2022-11-25 | Disposition: A | Discharge status: Home / Self Care | Payer: Medicare Other | Source: Ambulatory Visit | Attending: Interventional Cardiology | Admitting: Interventional Cardiology

## 2022-11-25 DIAGNOSIS — I10 Essential (primary) hypertension: Secondary | ICD-10-CM | POA: Diagnosis not present

## 2022-11-25 DIAGNOSIS — Z6835 Body mass index (BMI) 35.0-35.9, adult: Secondary | ICD-10-CM | POA: Diagnosis not present

## 2022-11-25 DIAGNOSIS — Z48812 Encounter for surgical aftercare following surgery on the circulatory system: Secondary | ICD-10-CM | POA: Diagnosis not present

## 2022-11-25 DIAGNOSIS — E785 Hyperlipidemia, unspecified: Secondary | ICD-10-CM | POA: Diagnosis not present

## 2022-11-25 DIAGNOSIS — Z9889 Other specified postprocedural states: Secondary | ICD-10-CM | POA: Diagnosis not present

## 2022-11-25 DIAGNOSIS — E669 Obesity, unspecified: Secondary | ICD-10-CM | POA: Diagnosis not present

## 2022-11-27 ENCOUNTER — Encounter (HOSPITAL_COMMUNITY)
Admission: RE | Admit: 2022-11-27 | Discharge: 2022-11-27 | Disposition: A | Discharge status: Home / Self Care | Payer: Medicare Other | Source: Ambulatory Visit | Attending: Interventional Cardiology | Admitting: Interventional Cardiology

## 2022-11-27 VITALS — Ht 69.75 in | Wt 240.7 lb

## 2022-11-27 DIAGNOSIS — E669 Obesity, unspecified: Secondary | ICD-10-CM | POA: Diagnosis not present

## 2022-11-27 DIAGNOSIS — Z9889 Other specified postprocedural states: Secondary | ICD-10-CM | POA: Diagnosis not present

## 2022-11-27 DIAGNOSIS — Z6835 Body mass index (BMI) 35.0-35.9, adult: Secondary | ICD-10-CM | POA: Diagnosis not present

## 2022-11-27 DIAGNOSIS — I10 Essential (primary) hypertension: Secondary | ICD-10-CM | POA: Diagnosis not present

## 2022-11-27 DIAGNOSIS — E785 Hyperlipidemia, unspecified: Secondary | ICD-10-CM | POA: Diagnosis not present

## 2022-11-27 DIAGNOSIS — Z48812 Encounter for surgical aftercare following surgery on the circulatory system: Secondary | ICD-10-CM | POA: Diagnosis not present

## 2022-11-29 ENCOUNTER — Encounter (HOSPITAL_COMMUNITY)
Admission: RE | Admit: 2022-11-29 | Discharge: 2022-11-29 | Disposition: A | Discharge status: Home / Self Care | Payer: Medicare Other | Source: Ambulatory Visit | Attending: Interventional Cardiology | Admitting: Interventional Cardiology

## 2022-11-29 DIAGNOSIS — Z6835 Body mass index (BMI) 35.0-35.9, adult: Secondary | ICD-10-CM | POA: Diagnosis not present

## 2022-11-29 DIAGNOSIS — Z9889 Other specified postprocedural states: Secondary | ICD-10-CM

## 2022-11-29 DIAGNOSIS — Z48812 Encounter for surgical aftercare following surgery on the circulatory system: Secondary | ICD-10-CM | POA: Diagnosis not present

## 2022-11-29 DIAGNOSIS — E669 Obesity, unspecified: Secondary | ICD-10-CM | POA: Diagnosis not present

## 2022-11-29 DIAGNOSIS — E785 Hyperlipidemia, unspecified: Secondary | ICD-10-CM | POA: Diagnosis not present

## 2022-11-29 DIAGNOSIS — I10 Essential (primary) hypertension: Secondary | ICD-10-CM | POA: Diagnosis not present

## 2022-11-29 NOTE — Progress Notes (Signed)
Discharge Progress Report  Patient Details  Name: Ricky Meyers MRN: [id number] Date of Birth: 09-10-1946 Referring Provider:   Flowsheet Row INTENSIVE CARDIAC REHAB ORIENT from 08/27/2022 in [facility]  Referring Provider Larae Grooms, MD        Number of Visits: 48  Reason for Discharge:  Patient reached a stable level of exercise. Patient independent in their exercise. Patient has met program and personal goals.  Smoking History:  Social History   Tobacco Use  Smoking Status Never  Smokeless Tobacco Never    Diagnosis:  01/24/22 S/P Mitraclip, Mitral valve repair  ADL UCSD:   Initial Exercise Prescription:  Initial Exercise Prescription - 08/27/22 0900       Date of Initial Exercise RX and Referring Provider   Date 08/27/22    Referring Provider Larae Grooms, MD    Expected Discharge Date 11/01/22      NuStep   Level 1    SPM 75    Minutes 25    METs 2      Prescription Details   Frequency (times per week) 3    Duration Progress to 30 minutes of continuous aerobic without signs/symptoms of physical distress      Intensity   THRR 40-80% of Max Heartrate 57-114    Ratings of Perceived Exertion 11-13    Perceived Dyspnea 0-4      Progression   Progression Continue progressive overload as per policy without signs/symptoms or physical distress.      Resistance Training   Training Prescription Yes    Weight 3 lbs    Reps 10-15             Discharge Exercise Prescription (Final Exercise Prescription Changes):  Exercise Prescription Changes - 11/29/22 1400       Response to Exercise   Blood Pressure (Admit) 110/78    Blood Pressure (Exercise) 152/70    Blood Pressure (Exit) 110/70    Heart Rate (Admit) 91 bpm    Heart Rate (Exercise) 126 bpm    Heart Rate (Exit) 100 bpm    Rating of Perceived Exertion (Exercise) 10    Symptoms None    Comments Pt graduatedf rom the  CRP2 program today    Duration Continue with 30 min of aerobic exercise without signs/symptoms of physical distress.    Intensity THRR unchanged      Progression   Progression Continue to progress workloads to maintain intensity without signs/symptoms of physical distress.    Average METs 4.5      Resistance Training   Training Prescription Yes    Weight 4 lbs    Reps 10-15    Time 10 Minutes      Interval Training   Interval Training No      NuStep   Level 5    SPM 103    Minutes 30    METs 4.5      Home Exercise Plan   Plans to continue exercise at [facility] (comment)    Frequency Add 2 additional days to program exercise sessions.    Initial Home Exercises Provided 10/04/22             Functional Capacity:  6 Minute Walk     Row Name 08/27/22 0848 11/19/22 0922       6 Minute Walk   Phase Initial Discharge    Distance 1048 feet 1389 feet  test perfomed 11/13/21  Distance % Change -- 32.54 %    Distance Feet Change -- 341 ft    Walk Time 6 minutes 6 minutes    # of Rest Breaks 1  break due to being over THR 0    MPH 1.98 2.63    METS 2 3.01    RPE 12 11    Perceived Dyspnea  3 0    VO2 Peak 6.96 9.8    Symptoms Yes (comment) No    Comments SOB, RPD = 3 --    Resting HR 92 bpm 90 bpm    Resting BP 134/86 120/70    Resting Oxygen Saturation  95 % 98 %    Exercise Oxygen Saturation  during 6 min walk 94 % 98 %    Max Ex. HR 125 bpm 126 bpm    Max Ex. BP 150/90 155/64    2 Minute Post BP 142/86 --             Psychological, QOL, Others - Outcomes: PHQ 2/9:    11/29/2022    9:29 AM 11/18/2022   12:51 PM 11/13/2022    1:11 PM 09/12/2022   11:20 AM 08/27/2022    8:42 AM  Depression screen PHQ 2/9  Decreased Interest 0 0 0 0 0  Down, Depressed, Hopeless 0 0 0 0 0  PHQ - 2 Score 0 0 0 0 0  Altered sleeping  0 0 0   Tired, decreased energy  0 0 0   Change in appetite  0 0 0   Feeling bad or failure about yourself   0 0 0   Trouble  concentrating  0 0 0   Moving slowly or fidgety/restless  0 0 0   Suicidal thoughts  0 0 0   PHQ-9 Score  0 0 0   Difficult doing work/chores Not difficult at all Not difficult at all Not difficult at all Not difficult at all     Quality of Life:  Quality of Life - 11/06/22 1048       Quality of Life Scores   Health/Function Pre 21.87 %    Health/Function Post 23.7 %    Health/Function % Change 8.37 %    Socioeconomic Pre 24.83 %    Socioeconomic Post 26.06 %    Socioeconomic % Change  4.95 %    Psych/Spiritual Pre 23.57 %    Psych/Spiritual Post 25.93 %    Psych/Spiritual % Change 10.01 %    Family Pre 24.1 %    Family Post 27.3 %    Family % Change 13.28 %    GLOBAL Pre 23.11 %    GLOBAL Post 25.2 %    GLOBAL % Change 9.04 %             Personal Goals: Goals established at orientation with interventions provided to work toward goal.  Personal Goals and Risk Factors at Admission - 08/27/22 0939       Core Components/Risk Factors/Patient Goals on Admission    Weight Management Yes;Obesity;Weight Loss    Intervention Weight Management: Develop a combined nutrition and exercise program designed to reach desired caloric intake, while maintaining appropriate intake of nutrient and fiber, sodium and fats, and appropriate energy expenditure required for the weight goal.;Weight Management: Provide education and appropriate resources to help participant work on and attain dietary goals.;Weight Management/Obesity: Establish reasonable short term and long term weight goals.;Obesity: Provide education and appropriate resources to help participant work on and attain dietary  goals.    Goal Weight: Long Term 220 lb (99.8 kg)    Expected Outcomes Short Term: Continue to assess and modify interventions until short term weight is achieved;Long Term: Adherence to nutrition and physical activity/exercise program aimed toward attainment of established weight goal;Weight Maintenance:  Understanding of the daily nutrition guidelines, which includes 25-35% calories from fat, 7% or less cal from saturated fats, less than 236m cholesterol, less than 1.5gm of sodium, & 5 or more servings of fruits and vegetables daily;Weight Loss: Understanding of general recommendations for a balanced deficit meal plan, which promotes 1-2 lb weight loss per week and includes a negative energy balance of [phone] kcal/d;Understanding recommendations for meals to include 15-35% energy as protein, 25-35% energy from fat, 35-60% energy from carbohydrates, less than 2053mof dietary cholesterol, 20-35 gm of total fiber daily;Understanding of distribution of calorie intake throughout the day with the consumption of 4-5 meals/snacks    Hypertension Yes    Intervention Provide education on lifestyle modifcations including regular physical activity/exercise, weight management, moderate sodium restriction and increased consumption of fresh fruit, vegetables, and low fat dairy, alcohol moderation, and smoking cessation.;Monitor prescription use compliance.    Expected Outcomes Short Term: Continued assessment and intervention until BP is < 140/9017mG in hypertensive participants. < 130/9m76m in hypertensive participants with diabetes, heart failure or chronic kidney disease.;Long Term: Maintenance of blood pressure at goal levels.    Lipids Yes    Intervention Provide education and support for participant on nutrition & aerobic/resistive exercise along with prescribed medications to achieve LDL <70mg44mL >40mg.24mExpected Outcomes Short Term: Participant states understanding of desired cholesterol values and is compliant with medications prescribed. Participant is following exercise prescription and nutrition guidelines.;Long Term: Cholesterol controlled with medications as prescribed, with individualized exercise RX and with personalized nutrition plan. Value goals: LDL < 70mg, 65m> 40 mg.               Personal Goals Discharge:  Goals and Risk Factor Review     Row Name 09/10/22 1534 10/04/22 1153 11/05/22 1209         Core Components/Risk Factors/Patient Goals Review   Personal Goals Review Weight Management/Obesity;Hypertension;Lipids Weight Management/Obesity;Hypertension;Lipids Weight Management/Obesity;Hypertension;Lipids     Review Hajime Nasimd intensive cardiac rehab on 09/09/22 and did well with exercise. vital signs were stable Tyjon Kyngstenen doing  did well with exercise. vital signs have been stable.Namari Salathielst 2.5 kg since starting intensive cardiac rehab. Nijee Faraazen doing  did well with exercise. vital signs have been stable.Deaundra Nasirst 1.9 kg since starting intensive cardiac rehab.     Expected Outcomes Keian Tatumontinue to participate in intensive cardiac rehab for exercise, nutrition and lifestyle modifications Oris Ryceontinue to participate in intensive cardiac rehab for exercise, nutrition and lifestyle modifications Eulogio Hoyetontinue to participate in intensive cardiac rehab for exercise, nutrition and lifestyle modifications              Exercise Goals and Review:  Exercise Goals     Row Name 08/27/22 0943             Exercise Goals   Increase Physical Activity Yes       Intervention Provide advice, education, support and counseling about physical activity/exercise needs.;Develop an individualized exercise prescription for aerobic and resistive training based on initial evaluation findings, risk stratification, comorbidities and participant's personal goals.       Expected Outcomes Short Term: Attend rehab on  a regular basis to increase amount of physical activity.;Long Term: Add in home exercise to make exercise part of routine and to increase amount of physical activity.;Long Term: Exercising regularly at least 3-5 days a week.       Increase Strength and Stamina Yes       Intervention Provide advice, education, support and  counseling about physical activity/exercise needs.;Develop an individualized exercise prescription for aerobic and resistive training based on initial evaluation findings, risk stratification, comorbidities and participant's personal goals.       Expected Outcomes Long Term: Improve cardiorespiratory fitness, muscular endurance and strength as measured by increased METs and functional capacity (6MWT);Short Term: Perform resistance training exercises routinely during rehab and add in resistance training at home;Short Term: Increase workloads from initial exercise prescription for resistance, speed, and METs.       Able to understand and use rate of perceived exertion (RPE) scale Yes       Intervention Provide education and explanation on how to use RPE scale       Expected Outcomes Short Term: Able to use RPE daily in rehab to express subjective intensity level;Long Term:  Able to use RPE to guide intensity level when exercising independently       Knowledge and understanding of Target Heart Rate Range (THRR) Yes       Intervention Provide education and explanation of THRR including how the numbers were predicted and where they are located for reference       Expected Outcomes Short Term: Able to state/look up THRR;Short Term: Able to use daily as guideline for intensity in rehab;Long Term: Able to use THRR to govern intensity when exercising independently       Understanding of Exercise Prescription Yes       Intervention Provide education, explanation, and written materials on patient's individual exercise prescription       Expected Outcomes Short Term: Able to explain program exercise prescription;Long Term: Able to explain home exercise prescription to exercise independently                Exercise Goals Re-Evaluation:  Exercise Goals Re-Evaluation     Row Name 09/09/22 1058 10/04/22 1417 11/01/22 1148 11/29/22 1438       Exercise Goal Re-Evaluation   Exercise Goals Review Increase  Physical Activity;Increase Strength and Stamina;Able to understand and use rate of perceived exertion (RPE) scale;Knowledge and understanding of Target Heart Rate Range (THRR);Understanding of Exercise Prescription Increase Physical Activity;Increase Strength and Stamina;Able to understand and use rate of perceived exertion (RPE) scale;Knowledge and understanding of Target Heart Rate Range (THRR);Understanding of Exercise Prescription;Able to check pulse independently Increase Physical Activity;Increase Strength and Stamina;Able to understand and use rate of perceived exertion (RPE) scale;Knowledge and understanding of Target Heart Rate Range (THRR);Understanding of Exercise Prescription;Able to check pulse independently Increase Physical Activity;Increase Strength and Stamina;Able to understand and use rate of perceived exertion (RPE) scale;Knowledge and understanding of Target Heart Rate Range (THRR);Understanding of Exercise Prescription;Able to check pulse independently    Comments Pt's first day in the CRP2 program. Pt understands the exercise RX, RPE scale and THRR, Reviewed METs, goals, home ExRx with pt. Pt tolerated exercise well with avg MET level of 2.5. Pt feels he is not progressing towards goals of increase strength, stamina, endurance due to set back with recent fall. Pt voices that he is focusing on diet for weight loss goals, and feels he is making progress. Pt will exercise on his own by going to [employer]  2x/week to use N[employer] for 70mn and machine weights. Pt is excited for home exercise and feels it will help him get on track to meet his goals. Pt is progressing well in program. Reviewed METs and goals. Pt voices improvement in strength and stamina. Voices he feels more energetic when he gets up in the morning. [facility]. Pt made good progress in the program and had peak METs of 4.5. Pt feel better and he voices his strength and stamina have improved. [facility]  [facility] 4/5x/wk , 150 minutes of exercise per week encouraged.    Expected Outcomes Will continue to montior patient and progress exercise workloads as tolerated. Will continue to montior patient and progress exercise workloads as tolerated. Will continue to montior patient and progress exercise workloads as tolerated. Pt will continue his exercise at the [employer]             Nutrition & Weight - Outcomes:  Pre Biometrics - 08/27/22 0800       Pre Biometrics   Waist Circumference 46.25 inches    Hip Circumference 47.5 inches    Waist to Hip Ratio 0.97 %    Triceps Skinfold 13 mm    % Body Fat 32.9 %    Grip Strength 33 kg    Flexibility --   Not done, recent hip replacement   Single Leg Stand 29 seconds             Post Biometrics - 11/19/22 1139        Post  Biometrics   Height 5' 9.75" (1.772 m)   Measuremtnts taken 11/15/22   Weight 109.2 kg    Waist Circumference 46 inches    Hip Circumference 47.5 inches    Waist to Hip Ratio 0.97 %    BMI (Calculated) 34.78    Triceps Skinfold 11 mm    % Body Fat 31.9 %    Grip Strength 40 kg    Flexibility --   Not performed   Single Leg Stand 17.8 seconds             Nutrition:  Nutrition Therapy & Goals - 11/22/22 1025       Nutrition Therapy   Diet Heart Healthy Diet    Drug/Food Interactions Statins/Certain Fruits      Personal Nutrition Goals   Nutrition Goal Patient to identify strategies for managing cardiovascular risk by attending the weekly [facility] education and nutrition courses    Personal Goal #2 Patient to identify food sources and limit daily intake of saturated fat, trans fat, sodium, and refined carbohydrates    Personal Goal #3 Patient to limit sodium to <15058mper day.    Comments Goals in action. GeHomeroemains very motivated to make lifestyle and dietary changes to aid with heart health and weight loss. He continues to  attend the [facility] education and nutrition series. He reports improved understanding of heart healthy diet  including increased high fiber foods, reducing saturated fat intake, and reading food labels for sodium and sugar. He continues mindfulness of snacking, portions sizes, etc to aid with weight loss. He is down 8# since starting with our program. His wife is very supportive of lifestyle changes.      Intervention Plan   Intervention Prescribe, educate and counsel regarding individualized specific dietary modifications aiming towards targeted core components such as weight, hypertension, lipid management, diabetes, heart failure and other  comorbidities.;Nutrition handout(s) given to patient.    Expected Outcomes Short Term Goal: Understand basic principles of dietary content, such as calories, fat, sodium, cholesterol and nutrients.;Long Term Goal: Adherence to prescribed nutrition plan.             Nutrition Discharge:  Nutrition Assessments - 12/11/22 1008       Rate Your Plate Scores   Post Score 68             Education Questionnaire Score:  Knowledge Questionnaire Score - 11/07/22 0950       Knowledge Questionnaire Score   Post Score 22/24             Goals reviewed with patient; copy given to patient.Pt graduates from  Intensive/Traditional cardiac rehab program today with completion of    ** exercise and education sessions. Pt maintained good attendance and progressed nicely during their participation in rehab as evidenced by increased MET level.   Medication list reconciled. Repeat  PHQ score-0  .  Pt has made significant lifestyle changes and should be commended for their success. Dallan achieved their goals during cardiac rehab.   Pt plans to continue exercise at [employer] 4 times a week and walk on the days he does not attend [employer]. Chevy wants to continue to lose weight and hopes to continue to eat less bread and drink less alcohol. Clair  increased his distance on  his post exercise walk test by 341 feet. We are proud of Elester's progress!Harrell Gave RN BSN

## 2022-12-03 DIAGNOSIS — H25013 Cortical age-related cataract, bilateral: Secondary | ICD-10-CM | POA: Diagnosis not present

## 2022-12-03 DIAGNOSIS — H2513 Age-related nuclear cataract, bilateral: Secondary | ICD-10-CM | POA: Diagnosis not present

## 2022-12-03 DIAGNOSIS — H35033 Hypertensive retinopathy, bilateral: Secondary | ICD-10-CM | POA: Diagnosis not present

## 2022-12-03 DIAGNOSIS — D3131 Benign neoplasm of right choroid: Secondary | ICD-10-CM | POA: Diagnosis not present

## 2022-12-03 DIAGNOSIS — H35363 Drusen (degenerative) of macula, bilateral: Secondary | ICD-10-CM | POA: Diagnosis not present

## 2022-12-12 NOTE — Progress Notes (Signed)
Care Management & Coordination Services Pharmacy Note  12/12/2022 Name:  Ricky Meyers MRN:  [id number] DOB:  [birth year]  Summary: -Pt reports completion of cardiac rehab and denies any health concerns at this time -Pursuing PAP for Eliquis, awaiting company response  Recommendations/Changes made from today's visit: -Continue medication therapy -Notify office of any signs of bleed that may be associated with Eliquis -Continue regular exercise, try a salt replacement for food if salt needed  Follow up plan: HTN review call in 3 months Pharmacist call in 6 months   Subjective: Ricky Meyers is an 77 y.o. year old male who is a primary patient of [name], MD.  The care coordination team was consulted for assistance with disease management and care coordination needs.    Engaged with patient by telephone for follow up visit.  Recent office visits: 11/18/22 AWV 11/13/22 [name], MD. For OA, START Percocet [phone] every 6 h prn  Recent consult visits: 11/29/22: Attends cardiac rehab every 2 days  Hospital visits: None in previous 6 months   Objective:  Lab Results  Component Value Date   CREATININE 1.00 08/26/2022   BUN 15 08/26/2022   GFR 72.05 08/26/2022   EGFR 81 12/07/2021   GFRNONAA >60 05/15/2022   GFRAA >60 06/17/2019   NA 138 08/26/2022   K 4.7 08/26/2022   CALCIUM 9.5 08/26/2022   CO2 28 08/26/2022   GLUCOSE 103 (H) 08/26/2022    Lab Results  Component Value Date/Time   HGBA1C 5.6 08/26/2022 10:35 AM   HGBA1C 5.6 08/23/2021 09:40 AM   GFR 72.05 08/26/2022 10:35 AM   GFR 55.52 (L) 08/23/2021 09:40 AM    Last diabetic Eye exam: No results found for: "HMDIABEYEEXA"  Last diabetic Foot exam: No results found for: "HMDIABFOOTEX"   Lab Results  Component Value Date   CHOL 160 08/26/2022   HDL 66.00 08/26/2022   LDLCALC 71 08/26/2022   LDLDIRECT 161.4 09/29/2012   TRIG 118.0 08/26/2022   CHOLHDL 2 08/26/2022       Latest Ref Rng & Units  08/26/2022   10:35 AM 01/22/2022    2:30 PM 08/23/2021    9:40 AM  Hepatic Function  Total Protein 6.0 - 8.3 g/dL 6.9  6.8  6.7   Albumin 3.5 - 5.2 g/dL 4.2  3.9  4.2   AST 0 - 37 U/L 26  28  17   $ ALT 0 - 53 U/L 24  24  15   $ Alk Phosphatase 39 - 117 U/L 80  59  58   Total Bilirubin 0.2 - 1.2 mg/dL 0.9  1.1  1.1   Bilirubin, Direct 0.0 - 0.3 mg/dL 0.2   0.2     Lab Results  Component Value Date/Time   TSH 1.80 08/26/2022 10:35 AM   TSH 1.32 08/23/2021 09:40 AM       Latest Ref Rng & Units 08/26/2022   10:35 AM 05/15/2022   11:04 AM 01/25/2022   12:40 AM  CBC  WBC 4.0 - 10.5 K/uL 6.5  7.4  9.8   Hemoglobin 13.0 - 17.0 g/dL 13.1  13.8  12.2   Hematocrit 39.0 - 52.0 % 40.0  41.0  36.3   Platelets 150.0 - 400.0 K/uL 164.0  166  179     No results found for: "VD25OH", "VITAMINB12"  Clinical ASCVD: No  The ASCVD Risk score (Arnett DK, et al., 2019) failed to calculate for the following reasons:   The systolic blood pressure  is missing        11/29/2022    9:29 AM 11/18/2022   12:51 PM 11/13/2022    1:11 PM  Depression screen PHQ 2/9  Decreased Interest 0 0 0  Down, Depressed, Hopeless 0 0 0  PHQ - 2 Score 0 0 0  Altered sleeping  0 0  Tired, decreased energy  0 0  Change in appetite  0 0  Feeling bad or failure about yourself   0 0  Trouble concentrating  0 0  Moving slowly or fidgety/restless  0 0  Suicidal thoughts  0 0  PHQ-9 Score  0 0  Difficult doing work/chores Not difficult at all Not difficult at all Not difficult at all     Social History   Tobacco Use  Smoking Status Never  Smokeless Tobacco Never   BP Readings from Last 3 Encounters:  11/13/22 112/80  09/12/22 98/76  08/27/22 134/86   Pulse Readings from Last 3 Encounters:  11/13/22 75  09/12/22 89  08/27/22 85   Wt Readings from Last 3 Encounters:  11/19/22 240 lb 11.9 oz (109.2 kg)  11/18/22 241 lb (109.3 kg)  11/13/22 241 lb (109.3 kg)   BMI Readings from Last 3 Encounters:  11/19/22  34.79 kg/m  11/18/22 31.80 kg/m  11/13/22 34.83 kg/m    No Known Allergies  Medications Reviewed Today     Reviewed by Magda Kiel, RN (Registered Nurse) on 11/29/22 at [place] List Status: <None>   Medication Order Taking? Sig Documenting Provider Last Dose Status Informant  allopurinol (ZYLOPRIM) 300 MG tablet [id number] Yes TAKE 1 TABLET BY MOUTH EVERY DAY [name], MD Taking Active   amoxicillin (AMOXIL) 500 MG capsule [id number] Yes Take 4 capsules (2,000 mg total) by mouth as directed. Take (4) capsules 1 hour before dental work Angelena Form, Annita Brod, MD Taking Active   apixaban (ELIQUIS) 2.5 MG TABS tablet [id number] Yes Take 1 tablet (2.5 mg total) by mouth 2 (two) times daily. Leighton Parody, PA-C Taking Active Spouse/Significant Other  atorvastatin (LIPITOR) 40 MG tablet [id number] Yes TAKE 1 TABLET BY MOUTH EVERY DAY [name], MD Taking Active   cyclobenzaprine (FLEXERIL) 10 MG tablet [id number] Yes Take 1 tablet (10 mg total) by mouth 3 (three) times daily as needed for muscle spasms.  Patient taking differently: Take 10 mg by mouth See admin instructions. Take 1 tablet (10 mg) by mouth scheduled in the morning & take 1 tablet (10 mg) by mouth scheduled in the evening, may take an additional dose in in the afternoon if needed for spasms/pain.   Leighton Parody, PA-C Taking Active Spouse/Significant Other  famotidine (PEPCID) 20 MG tablet [id number] Yes Take 1 tablet (20 mg total) by mouth daily after supper. [name], MD Taking Active Spouse/Significant Other  gabapentin (NEURONTIN) 100 MG capsule [id number] Yes Take 1 capsule (100 mg total) by mouth 2 (two) times daily. [name], MD Taking Active   Melatonin 12 MG TABS [id number] Yes Take 12 mg by mouth at bedtime. [provider] Taking Active Spouse/Significant Other  METAMUCIL FIBER PO [id number] Yes Take 1 Dose by mouth See admin instructions. Take 1 dose by mouth (scheduled) daily, may  take a second dose in the afternoon as needed for constipation [provider] Taking Active Spouse/Significant Other  oxyCODONE-acetaminophen (PERCOCET) 10-325 MG tablet [id number] Yes Take 1 tablet by mouth every 6 (six) hours as needed for pain. [name], MD Taking  Active   PARoxetine (PAXIL) 20 MG tablet [id number] Yes Take 1 tablet (20 mg total) by mouth every morning. [name], MD Taking Active   telmisartan-hydrochlorothiazide (MICARDIS HCT) 80-25 MG tablet [id number] Yes Take 1 tablet by mouth daily. [name], MD Taking Active             SDOH:  (Social Determinants of Health) assessments and interventions performed: Yes SDOH Interventions    Flowsheet Row Office Visit from 11/18/2022 in [facility] [place] Management from 06/18/2022 in [facility] at [place] Management from 10/19/2020 in [facility] at [place] Interventions Intervention Not Indicated -- --  Housing Interventions Intervention Not Indicated -- --  Transportation Interventions Intervention Not Indicated -- Intervention Not Indicated  Utilities Interventions Intervention Not Indicated -- --  Alcohol Usage Interventions Intervention Not Indicated (Score <7) -- --  Financial Strain Interventions -- Intervention Not Indicated Intervention Not Indicated  Physical Activity Interventions Intervention Not Indicated -- --  Stress Interventions Intervention Not Indicated -- --  Social Connections Interventions Intervention Not Indicated -- --       Medication Assistance: Application for Eliquis  medication assistance program. in process.  Anticipated assistance start date 12/2022.  See plan of care for additional detail.  Medication Access: Within the past 30 days, how often has patient missed a dose of medication? None Is a pillbox or other method used to improve  adherence? Yes  Factors that may affect medication adherence? financial need Are meds synced by current pharmacy? No  Are meds delivered by current pharmacy? No  Does patient experience delays in picking up medications due to transportation concerns? No   [facility] Services Reviewed: Is patient disadvantaged to use [facility]?: No  Current Rx insurance plan: [place] Name and location of Current pharmacy:  [facility] - [place], [place] - [address] [place] [address] Phone: [phone] Fax: [place] [address] [address] Phone: [phone] Fax: [phone]  [facility] services reviewed with patient today?: No  Patient requests to transfer care to [facility]?: No  Reason patient declined to change pharmacies: Disadvantaged due to insurance/mail order  Compliance/Adherence/Medication fill history: Care Gaps: COVID Vaccine  Star-Rating Drugs: Atorvastatin 41m PDC 100% Telmisartan-HCTZ 80-271m83%   Assessment/Plan   Hypertension (BP goal <130/80) -Controlled -Current treatment: Telmisartan-HCTZ 80-2528m qd -Medications previously tried: lisinopril  -Current home readings: 3x/week -Current dietary habits: watching salt intake -Current exercise habits: [employer] 3x/week -Denies hypotensive/hypertensive symptoms -Educated on BP goals and benefits of medications for prevention of heart attack, stroke and kidney damage; Daily salt intake goal < 2300 mg; Exercise goal of 150 minutes per week; Importance of home blood pressure monitoring; -Counseled to monitor BP at home once weekly, document, and provide log at future appointments -Recommended to continue current medication   CAD/Anticoag Use (Goal: Prevent ASCVD/Major Bleed) -Controlled -Current treatment  Eliquis 2.5mg41mD -Medications previously tried: None  -Denies any chest pain  episodes -Recommended to continue current medication Counseled on watching for signs of bleed   AngeCoachellarmacist [phone]

## 2022-12-13 ENCOUNTER — Telehealth: Payer: Self-pay

## 2022-12-13 NOTE — Progress Notes (Signed)
Care Management & Coordination Services Pharmacy Team  Reason for Encounter: Appointment Reminder  Contacted patient to confirm telephone appointment with Burman Riis, PharmD on 12/16/2022 at 4:00.  Spoke with patient on 12/13/2022   Do you have any problems getting your medications? None except for the cost of Eliquis  What is your top health concern you would like to discuss at your upcoming visit? Follow up with Eliquis pap, he dropped off some papers needed on 12/12/2022.  Have you seen any other providers since your last visit with PCP? Patient denies except for Cardiac rehab as noted in his chart.  Care Gaps: AWV -  completed 11/18/2022, scheduled 12/01/2023 Covid - overdue Hep C Screen - overdue   Star Rating Drug: Atrovastatin 40 mg - last filled 10/14/2022 90 [facility] at [facility] Telmisartan HCTZ 80/25 mg - last filled 10/14/2022 90 DS at [facility] Pharmacist Assistant [phone]

## 2022-12-16 ENCOUNTER — Ambulatory Visit: Payer: Medicare Other

## 2022-12-18 ENCOUNTER — Other Ambulatory Visit: Payer: Self-pay | Admitting: Cardiovascular Disease

## 2022-12-30 DIAGNOSIS — C44629 Squamous cell carcinoma of skin of left upper limb, including shoulder: Secondary | ICD-10-CM | POA: Diagnosis not present

## 2022-12-30 DIAGNOSIS — R229 Localized swelling, mass and lump, unspecified: Secondary | ICD-10-CM | POA: Diagnosis not present

## 2022-12-30 DIAGNOSIS — L570 Actinic keratosis: Secondary | ICD-10-CM | POA: Diagnosis not present

## 2022-12-30 DIAGNOSIS — Z08 Encounter for follow-up examination after completed treatment for malignant neoplasm: Secondary | ICD-10-CM | POA: Diagnosis not present

## 2022-12-30 DIAGNOSIS — L578 Other skin changes due to chronic exposure to nonionizing radiation: Secondary | ICD-10-CM | POA: Diagnosis not present

## 2022-12-30 DIAGNOSIS — Z85828 Personal history of other malignant neoplasm of skin: Secondary | ICD-10-CM | POA: Diagnosis not present

## 2022-12-30 DIAGNOSIS — L821 Other seborrheic keratosis: Secondary | ICD-10-CM | POA: Diagnosis not present

## 2023-01-13 ENCOUNTER — Other Ambulatory Visit: Payer: Self-pay | Admitting: Family Medicine

## 2023-01-27 ENCOUNTER — Other Ambulatory Visit (HOSPITAL_COMMUNITY): Payer: Medicare Other

## 2023-01-27 ENCOUNTER — Ambulatory Visit: Payer: Medicare Other

## 2023-02-03 ENCOUNTER — Ambulatory Visit (INDEPENDENT_AMBULATORY_CARE_PROVIDER_SITE_OTHER): Payer: Medicare Other | Admitting: Cardiology

## 2023-02-03 ENCOUNTER — Ambulatory Visit: Payer: Medicare Other | Attending: Cardiovascular Disease

## 2023-02-03 VITALS — BP 106/74 | HR 102 | Ht 69.75 in | Wt 239.0 lb

## 2023-02-03 DIAGNOSIS — I10 Essential (primary) hypertension: Secondary | ICD-10-CM | POA: Insufficient documentation

## 2023-02-03 DIAGNOSIS — Z95828 Presence of other vascular implants and grafts: Secondary | ICD-10-CM

## 2023-02-03 DIAGNOSIS — I340 Nonrheumatic mitral (valve) insufficiency: Secondary | ICD-10-CM | POA: Diagnosis not present

## 2023-02-03 DIAGNOSIS — Z9889 Other specified postprocedural states: Secondary | ICD-10-CM | POA: Insufficient documentation

## 2023-02-03 DIAGNOSIS — Z95818 Presence of other cardiac implants and grafts: Secondary | ICD-10-CM

## 2023-02-03 DIAGNOSIS — Z86711 Personal history of pulmonary embolism: Secondary | ICD-10-CM

## 2023-02-03 LAB — ECHOCARDIOGRAM COMPLETE
Area-P 1/2: 2.97 cm2
MV M vel: 4.13 m/s
MV Peak grad: 68.2 mmHg
MV VTI: 2.09 cm2
S' Lateral: 2.3 cm

## 2023-02-03 NOTE — Patient Instructions (Signed)
Medication Instructions:  Your physician recommends that you continue on your current medications as directed. Please refer to the Current Medication list given to you today.  *If you need a refill on your cardiac medications before your next appointment, please call your pharmacy*   Lab Work: NONE If you have labs (blood work) drawn today and your tests are completely normal, you will receive your results only by: [facility] (if you have MyChart) OR A paper copy in the mail If you have any lab test that is abnormal or we need to change your treatment, we will call you to review the results.   Testing/Procedures: NONE   Follow-Up: At [facility], you and your health needs are our priority.  As part of our continuing mission to provide you with exceptional heart care, we have created designated Provider Care Teams.  These Care Teams include your primary Cardiologist (physician) and Advanced Practice Providers (APPs -  Physician Assistants and Nurse Practitioners) who all work together to provide you with the care you need, when you need it.  We recommend signing up for the patient portal called "MyChart".  Sign up information is provided on this After Visit Summary.  MyChart is used to connect with patients for Virtual Visits (Telemedicine).  Patients are able to view lab/test results, encounter notes, upcoming appointments, etc.  Non-urgent messages can be sent to your provider as well.   To learn more about what you can do with MyChart, go to [email or web address].    Your next appointment:   6 month(s)  Provider:   Larae Grooms, MD

## 2023-02-03 NOTE — Progress Notes (Unsigned)
[facility] [place]                                     Cardiology Office Note:    Date:  02/04/2023   ID:  Ricky Meyers, Ricky Meyers 06-Jun-1946, MRN [id number]  PCP:  [name], MD  [facility] Cardiologist:  Larae Grooms, MD  [facility] Electrophysiologist:  None   Referring MD: [name], MD   Chief Complaint  Patient presents with   Follow-up    1 year s/p TEER   History of Present Illness:    Ricky Meyers is a 77 y.o. male with a hx of mild non-obstructive coronary artery disease s/p cath 12/2021, hx of idiopathic DVT/pulmonary embolism s/p retrievable IVC filter in 11/2021 placed prior to back surgery (removed 2/10), hypertension, hyperlipidemia, GERD, OSA, prostate CA, gout, and severe mitral regurgitation who presents today for 1 month f/u s/p TEER 01/24/22.    He was referred to Dr. Ali Lowe after echocardiogram showed severe mitral regurgitation. He recently underwent lumbar neurosurgery without complications. Over the 6 months prior, the patient developed dyspnea felt to be multifactorial with an element of deconditioning, body habitus, and severe degenerative mitral regurgitation all contributing. Echocardiogram showed degenerative mitral regurgitation with posterior prolapse. He was set up for [facility] which showed Mild, nonobstructive CAD. Subsequent TEE showed LVEF at 60-65% with multiple ruptured mitral chordae tendinae are seen, attached to a broad segment of the middle (P2) and medial (P3) scallops, but not involving the medial commissure.    He was evaluated by the multidisciplinary valve team and felt to have severe, symptomatic mitral regurgitation and to be a suitable candidate. He underwent successful TEER 01/24/22 with a MitraClip XTW and NTW using ultrasound guided right transfemoral venous access reducing MR from grade 4+ to 1+. Post operative echo with stable clip placement x2 with XTW medially and NTW  laterally. There was mild residual MR with MVA 2.9 cm2 with mean gradient 5 mmHg at HR 76 bp. Post clip medication plan was to continue home Eliquis. SBE discussed and RX'ed with Amoxicillin 2 g one hour prior to dental cleanings and procedures.    Ricky Meyers continues to do very well with no chest pain, SOB, LE edema, palpitations, orthopnea, or DOE. He has completed his cardiac rehab and continues to play golf several times per week. Echo today with normal EF at 60-65% with two MitraClips in place located at A2-P2 and A3-P3. There is increased mobility in the medial clip when compared to the mid-scallop clip, but both clips seem to have good attachment to both mitral leaflets. There are two  eccentric jets of residual mitral insufficiency. The medial jet is more significant, but both jets are probably mild. There is systolic dominant pulmonary vein flow with mild to moderate MR with a mean mitral valve gradient is 4.0 mmHg with average heart rate of 77 bpm.   Past Medical History:  Diagnosis Date   Arthritis    neck and back    Chronic neck pain    Depression    ED (erectile dysfunction)    GERD (gastroesophageal reflux disease)    dysphagia   Gout    sees Dr. Leigh Aurora    Hyperlipidemia    Hypertension    OSA (obstructive sleep apnea) 12/18/2017   Severe with AHI at 70.1/hr and is now on  CPAP at 11cm H2o   Prostate cancer    history of   Pulmonary embolism 06/2019   S/P mitral valve clip implantation 01/24/2022   s/p Mitraclip XTW + NTW with Dr. Burt Knack and Dr. Ali Lowe   Sleep apnea    snoring/never checked    Past Surgical History:  Procedure Laterality Date   AMPUTATION Left 07/18/2018   Procedure: LEFT RING FINGER REVISION AMPUTATION;  Surgeon: Leanora Cover, MD;  Location: [facility];  Service: Orthopedics;  Laterality: Left;   BACK SURGERY     CARDIAC CATHETERIZATION     CERVICAL FUSION  12/02/2007   per Dr. Sherley Bounds   COLONOSCOPY  03/11/2017   per Dr. Ardis Hughs, clear, no  repeats needed    injection lower back Right 09/17/2016   IR IVC FILTER PLMT / S&I /IMG GUID/MOD SED  11/15/2021   IR IVC FILTER RETRIEVAL / S&I /IMG GUID/MOD SED  12/14/2021   IR RADIOLOGIST EVAL & MGMT  10/30/2021   LUMBAR LAMINECTOMY/DECOMPRESSION MICRODISCECTOMY Left 11/21/2021   Procedure: Laminectomy and Foraminotomy - left - Lumbar three-four;  Surgeon: [name], MD;  Location: [facility];  Service: Neurosurgery;  Laterality: Left;   MITRAL VALVE REPAIR N/A 01/24/2022   Procedure: MITRAL VALVE REPAIR;  Surgeon: [name], MD;  Location: [facility];  Service: Cardiovascular;  Laterality: N/A;   PROSTATECTOMY  2000   RIGHT/LEFT HEART CATH AND CORONARY ANGIOGRAPHY N/A 12/20/2021   Procedure: RIGHT/LEFT HEART CATH AND CORONARY ANGIOGRAPHY;  Surgeon: [name], MD;  Location: [place] [facility];  Service: Cardiovascular;  Laterality: N/A;   ROTATOR CUFF REPAIR Right 11/2017   TEE WITHOUT CARDIOVERSION N/A 01/01/2022   Procedure: TRANSESOPHAGEAL ECHOCARDIOGRAM (TEE);  Surgeon: [name], MD;  Location: [place];  Service: Cardiovascular;  Laterality: N/A;   TEE WITHOUT CARDIOVERSION N/A 01/24/2022   Procedure: TRANSESOPHAGEAL ECHOCARDIOGRAM (TEE);  Surgeon: [name], MD;  Location: [place] [facility];  Service: Cardiovascular;  Laterality: N/A;   TONSILLECTOMY     as a child   TOTAL HIP ARTHROPLASTY Left 05/27/2022   Procedure: TOTAL HIP ARTHROPLASTY ANTERIOR APPROACH;  Surgeon: Dorna Leitz, MD;  Location: [facility];  Service: Orthopedics;  Laterality: Left;   VASECTOMY      Current Medications: Current Meds  Medication Sig   allopurinol (ZYLOPRIM) 300 MG tablet TAKE 1 TABLET BY MOUTH EVERY DAY   amoxicillin (AMOXIL) 500 MG capsule TAKE 4 CAPSULES BY MOUTH 1 hour BEFORE dental work   apixaban (ELIQUIS) 2.5 MG TABS tablet Take 1 tablet (2.5 mg total) by mouth 2 (two) times daily.   atorvastatin (LIPITOR) 40 MG tablet TAKE 1 TABLET BY MOUTH  EVERY DAY   colchicine (COLCRYS) 0.6 MG tablet Take by mouth daily.   cyclobenzaprine (FLEXERIL) 10 MG tablet Take 1 tablet (10 mg total) by mouth 3 (three) times daily as needed for muscle spasms. (Patient taking differently: Take 10 mg by mouth See admin instructions. Take 1 tablet (10 mg) by mouth scheduled in the morning & take 1 tablet (10 mg) by mouth scheduled in the evening, may take an additional dose in in the afternoon if needed for spasms/pain.)   diazepam (VALIUM) 10 MG tablet diazepam 10 mg tablet TAKE 1 TABLET BY MOUTH 30 MINUTES PRIOR TO MRI   famotidine (PEPCID) 20 MG tablet Take 1 tablet (20 mg total) by mouth daily after supper.   fluorouracil (EFUDEX) 5 % cream Apply topically 2 (two) times daily.   gabapentin (NEURONTIN)  100 MG capsule Take 1 capsule (100 mg total) by mouth 2 (two) times daily.   HYDROcodone-acetaminophen (NORCO/VICODIN) 5-325 MG tablet Take 1 tablet by mouth every 4 (four) hours as needed for moderate pain.   lisinopril-hydrochlorothiazide (ZESTORETIC) 20-25 MG tablet lisinopril 20 mg-hydrochlorothiazide 25 mg tablet Take 1 tablet by mouth daily.   Melatonin 12 MG TABS Take 12 mg by mouth at bedtime.   METAMUCIL FIBER PO Take 1 Dose by mouth See admin instructions. Take 1 dose by mouth (scheduled) daily, may take a second dose in the afternoon as needed for constipation   oxyCODONE-acetaminophen (PERCOCET) 10-325 MG tablet Take 1 tablet by mouth every 6 (six) hours as needed for pain.   pantoprazole (PROTONIX) 40 MG tablet Take by mouth.   PARoxetine (PAXIL) 20 MG tablet Take 1 tablet (20 mg total) by mouth every morning.   telmisartan-hydrochlorothiazide (MICARDIS HCT) 80-25 MG tablet Take 1 tablet by mouth daily.     Allergies:   Patient has no known allergies.   Social History   Socioeconomic History   Marital status: Soil scientist    Spouse name: Not on file   Number of children: 2   Years of education: 14   Highest education level: Not on file   Occupational History   Occupation: retired  Tobacco Use   Smoking status: Never   Smokeless tobacco: Never  Vaping Use   Vaping Use: Never used  Substance and Sexual Activity   Alcohol use: Yes    Comment: 5 drinks/week   Drug use: Not Currently    Comment: oxycodone is prescribed   Sexual activity: Not on file  Other Topics Concern   Not on file  Social History Narrative   Not on file   Social Determinants of Health   Financial Resource Strain: Low Risk  (06/18/2022)   Overall Financial Resource Strain (CARDIA)    Difficulty of Paying Living Expenses: Not very hard  Food Insecurity: No Food Insecurity (11/18/2022)   Hunger Vital Sign    Worried About Running Out of Food in the Last Year: Never true    Ran Out of Food in the Last Year: Never true  Transportation Needs: No Transportation Needs (11/18/2022)   PRAPARE - Hydrologist (Medical): No    Lack of Transportation (Non-Medical): No  Physical Activity: Sufficiently Active (11/18/2022)   Exercise Vital Sign    Days of Exercise per Week: 3 days    Minutes of Exercise per Session: 120 min  Stress: No Stress Concern Present (11/18/2022)   [place]    Feeling of Stress : Not at all  Social Connections: Moderately Integrated (11/18/2022)   Social Connection and Isolation Panel [NHANES]    Frequency of Communication with Friends and Family: More than three times a week    Frequency of Social Gatherings with Friends and Family: More than three times a week    Attends Religious Services: More than 4 times per year    Active Member of [employer] or Organizations: Yes    Attends Music therapist: More than 4 times per year    Marital Status: Divorced     Family History: [name] family history includes Breast cancer in his mother; Cancer in an other family member; Dementia in his sister; Diverticulitis in his mother;  Heart disease in his father; Hypertension in an other family member; Prostate cancer in his father; Stroke in his father and  another family member. There is no history of Colon cancer.  ROS:   Please see the history of present illness.    All other systems reviewed and are negative.  EKGs/Labs/Other Studies Reviewed:    The following studies were reviewed today:  Echocardiogram 02/25/22:   1. Left ventricular ejection fraction, by estimation, is 60%%. The left  ventricle has normal function. Left ventricular diastolic parameters are  indeterminate.   2. Right ventricular systolic function is normal. The right ventricular  size is normal. There is normal pulmonary artery systolic pressure.   3. Left atrial size was mildly dilated.   4. S/p MitraClip XTW and NTW. (01/24/22). Mean gradient through the valve  is 4 mm Hg and MVA is calculated at 2.78 cm2 (HR 72 bpm). MR is very  eccentric, directed anteriorly along wall of left atrium, probably mild.  Compared to echo from March 2023, no  significant change . The mitral valve has been repaired/replaced. Mild  mitral valve regurgitation. Procedure Date: 01/24/2022.   5. Aortic valve regurgitation is trivial. Aortic valve sclerosis is  present, with no evidence of aortic valve stenosis.   6. The inferior vena cava is normal in size with greater than 50%  respiratory variability, suggesting right atrial pressure of 3 mmHg.      Date of Procedure:                01/24/2022   Preoperative Diagnosis:      Severe Symptomatic Mitral Regurgitation (Stage D)   Postoperative Diagnosis:    Same    Procedure Performed: Ultrasound-guided right transfemoral venous access Double PreClose right femoral vein Transseptal puncture using Bailess RF needle Mitral valve repair with MitraClip XTW + NTW   Surgeon: Lenna Sciara, MD   Echocardiographer: Renee Harder, MD   Anesthesiologist: Rochele Pages, DO   Device Implant: Mitraclip XTW +  NTW  Serial # [id number] _____________   Echo 01/25/22:    1. Left ventricular ejection fraction, by estimation, is 60 to 65%. The  left ventricle has normal function. The left ventricle has no regional  wall motion abnormalities. Left ventricular diastolic parameters are  indeterminate.   2. Right ventricular systolic function is normal. The right ventricular  size is normal.   3. Left atrial size was mildly dilated.   4. Post TEER with two clips XTW medially and NTW laterally. Mild residual  MR MVA 2.9 cm2 with mean gradient 5 mmHg at HR 76 bpm. The mitral valve  has been repaired/replaced. No evidence of mitral valve regurgitation. No  evidence of mitral stenosis.  There is a Mitra-Clip present in the mitral position.   5. The aortic valve is tricuspid. There is mild calcification of the  aortic valve. Aortic valve regurgitation is not visualized. Aortic valve  sclerosis is present, with no evidence of aortic valve stenosis.   6. The inferior vena cava is normal in size with greater than 50%  respiratory variability, suggesting right atrial pressure of 3 mmHg.   EKG:  EKG is not ordered today.   Recent Labs: 08/26/2022: ALT 24; BUN 15; Creatinine, Ser 1.00; Hemoglobin 13.1; Platelets 164.0; Potassium 4.7; Sodium 138; TSH 1.80  Recent Lipid Panel    Component Value Date/Time   CHOL 160 08/26/2022 1035   TRIG 118.0 08/26/2022 1035   HDL 66.00 08/26/2022 1035   CHOLHDL 2 08/26/2022 1035   VLDL 23.6 08/26/2022 1035   LDLCALC 71 08/26/2022 1035   LDLCALC 88 08/22/2020 1208   LDLDIRECT 161.4  09/29/2012 0811   Physical Exam:    VS:  BP 106/74   Pulse (!) 102   Ht 5' 9.75" (1.772 m)   Wt 239 lb (108.4 kg)   SpO2 96%   BMI 34.54 kg/m     Wt Readings from Last 3 Encounters:  02/03/23 239 lb (108.4 kg)  11/19/22 240 lb 11.9 oz (109.2 kg)  11/18/22 241 lb (109.3 kg)    General: Well developed, well nourished, NAD Lungs:Clear to ausculation bilaterally. No wheezes, rales,  or rhonchi. Breathing is unlabored. Cardiovascular: RRR with S1 S2. No murmur Extremities: No edema. Neuro: Alert and oriented. No focal deficits. No facial asymmetry. MAE spontaneously. Psych: Responds to questions appropriately with normal affect.    ASSESSMENT/PLAN:    Severe mitral regurgitation: Patient doing very well with NYHA class I symptoms s/p successful TEER with a MitraClip XTW and NTW reducing MR from grade 4+ to 1+ on 01/24/22. Echo today with normal EF at 60-65% with two MitraClips in place located at A2-P2 and A3-P3. There is increased mobility in the medial clip when compared to the mid-scallop clip, but both clips seem to have good attachment to both mitral leaflets. There are two  eccentric jets of residual mitral insufficiency. The medial jet is more significant, but both jets are probably mild. There is systolic dominant pulmonary vein flow with mild to moderate MR with a mean mitral valve gradient is 4.0 mmHg with average heart rate of 77 bpm. Continue low dose Eliquis and dental with Amoxicillin 2g. Plan regular follow up with Dr. Irish Lack.    Idiopathic DVT/pulmonary embolism: s/p retrievable IVC filter in 11/2021>>removed 12/2021. No current issues. Continue Eliquis. No c/o bleeding in stool or urine.     Hypertension: Stable, with no changes in therapy at this time.    Hyperlipidemia: Last LDL, 71. Continue Lipitor    Medication Adjustments/Labs and Tests Ordered: Current medicines are reviewed at length with the patient today.  Concerns regarding medicines are outlined above.  No orders of the defined types were placed in this encounter.  No orders of the defined types were placed in this encounter.   Patient Instructions  Medication Instructions:  Your physician recommends that you continue on your current medications as directed. Please refer to the Current Medication list given to you today.  *If you need a refill on your cardiac medications before your next  appointment, please call your pharmacy*   Lab Work: NONE If you have labs (blood work) drawn today and your tests are completely normal, you will receive your results only by: [place] (if you have MyChart) OR A paper copy in the mail If you have any lab test that is abnormal or we need to change your treatment, we will call you to review the results.   Testing/Procedures: NONE   Follow-Up: At [facility], you and your health needs are our priority.  As part of our continuing mission to provide you with exceptional heart care, we have created designated Provider Care Teams.  These Care Teams include your primary Cardiologist (physician) and Advanced Practice Providers (APPs -  Physician Assistants and Nurse Practitioners) who all work together to provide you with the care you need, when you need it.  We recommend signing up for the patient portal called "MyChart".  Sign up information is provided on this After Visit Summary.  MyChart is used to connect with patients for Virtual Visits (Telemedicine).  Patients are able to view lab/test results, encounter notes,  upcoming appointments, etc.  Non-urgent messages can be sent to your provider as well.   To learn more about what you can do with MyChart, go to [email or web address].    Your next appointment:   6 month(s)  Provider:   Larae Grooms, MD    Signed, Kathyrn Drown, NP  02/04/2023 9:39 AM    [place]

## 2023-02-04 ENCOUNTER — Telehealth: Payer: Self-pay

## 2023-02-04 DIAGNOSIS — Z95818 Presence of other cardiac implants and grafts: Secondary | ICD-10-CM

## 2023-02-04 MED ORDER — AMOXICILLIN 500 MG PO CAPS
ORAL_CAPSULE | ORAL | 3 refills | Status: DC
Start: 2023-02-04 — End: 2024-02-13

## 2023-02-04 NOTE — Telephone Encounter (Signed)
-----   Message from Tommie Raymond, NP sent at 02/04/2023 10:05 AM EDT ----- Please let the patient know that his echo looks great! Stable MitraClip placement and valve gradients. No changes needed at this time.

## 2023-02-04 NOTE — Telephone Encounter (Signed)
Spoke with patient about echo results. Patient requested a refill of amoxicillin for SBE prophylaxis for upcoming dentist appointment, sent in to his pharmacy. No further questions or concerns at this time.

## 2023-02-12 ENCOUNTER — Telehealth (INDEPENDENT_AMBULATORY_CARE_PROVIDER_SITE_OTHER): Payer: Medicare Other | Admitting: Family Medicine

## 2023-02-12 ENCOUNTER — Encounter: Payer: Self-pay | Admitting: Family Medicine

## 2023-02-12 DIAGNOSIS — M159 Polyosteoarthritis, unspecified: Secondary | ICD-10-CM | POA: Diagnosis not present

## 2023-02-12 DIAGNOSIS — F1190 Opioid use, unspecified, uncomplicated: Secondary | ICD-10-CM

## 2023-02-12 MED ORDER — OXYCODONE-ACETAMINOPHEN 10-325 MG PO TABS: 1.0000 | ORAL_TABLET | Freq: Four times a day (QID) | ORAL | 0 refills | Status: DC | PRN

## 2023-02-12 NOTE — Progress Notes (Signed)
Subjective:    Patient ID: Ricky SliceGerald P Mcgillis, male    DOB: [birth year], 77 y.o.   MRN: [id number]  HPI Virtual Visit via Video Note  I connected with the patient on 02/12/23 at  9:30 AM EDT by a video enabled telemedicine application and verified that I am speaking with the correct person using two identifiers.  Location patient: home Location provider:work or home office Persons participating in the virtual visit: patient, provider  I discussed the limitations of evaluation and management by telemedicine and the availability of in person appointments. The patient expressed understanding and agreed to proceed.   HPI: Here for pain management. He is doing well.    ROS: See pertinent positives and negatives per HPI.  Past Medical History:  Diagnosis Date   Arthritis    neck and back    Chronic neck pain    Depression    ED (erectile dysfunction)    GERD (gastroesophageal reflux disease)    dysphagia   Gout    sees Dr. Alben DeedsJames Beekman    Hyperlipidemia    Hypertension    OSA (obstructive sleep apnea) 12/18/2017   Severe with AHI at 70.1/hr and is now on CPAP at 11cm H2o   Prostate cancer    history of   Pulmonary embolism 06/2019   S/P mitral valve clip implantation 01/24/2022   s/p Mitraclip XTW + NTW with Dr. Excell Seltzerooper and Dr. Lynnette Caffeyhukkani   Sleep apnea    snoring/never checked    Past Surgical History:  Procedure Laterality Date   AMPUTATION Left 07/18/2018   Procedure: LEFT RING FINGER REVISION AMPUTATION;  Surgeon: Betha LoaKuzma, Kevin, MD;  Location: [facility] OR;  Service: Orthopedics;  Laterality: Left;   BACK SURGERY     CARDIAC CATHETERIZATION     CERVICAL FUSION  12/02/2007   per Dr. Marikay Alaravid Jones   COLONOSCOPY  03/11/2017   per Dr. Christella HartiganJacobs, clear, no repeats needed    injection lower back Right 09/17/2016   IR IVC FILTER PLMT / S&I /IMG GUID/MOD SED  11/15/2021   IR IVC FILTER RETRIEVAL / S&I /IMG GUID/MOD SED  12/14/2021   IR RADIOLOGIST EVAL & MGMT  10/30/2021   LUMBAR  LAMINECTOMY/DECOMPRESSION MICRODISCECTOMY Left 11/21/2021   Procedure: Laminectomy and Foraminotomy - left - Lumbar three-four;  Surgeon: Tia AlertJones, David S, MD;  [facility];  Service: Neurosurgery;  Laterality: Left;   MITRAL VALVE REPAIR N/A 01/24/2022   Procedure: MITRAL VALVE REPAIR;  Surgeon: Orbie Pyohukkani, Arun K, MD;  Location: [facility] INVASIVE CV LAB;  Service: Cardiovascular;  Laterality: N/A;   PROSTATECTOMY  2000   RIGHT/LEFT HEART CATH AND CORONARY ANGIOGRAPHY N/A 12/20/2021   Procedure: RIGHT/LEFT HEART CATH AND CORONARY ANGIOGRAPHY;  Surgeon: Corky CraftsVaranasi, Jayadeep S, MD;  [facility];  Service: Cardiovascular;  Laterality: N/A;   ROTATOR CUFF REPAIR Right 11/2017   TEE WITHOUT CARDIOVERSION N/A 01/01/2022   Procedure: TRANSESOPHAGEAL ECHOCARDIOGRAM (TEE);  Surgeon: Thurmon Fairroitoru, Mihai, MD;  [facility];  Service: Cardiovascular;  Laterality: N/A;   TEE WITHOUT CARDIOVERSION N/A 01/24/2022   Procedure: TRANSESOPHAGEAL ECHOCARDIOGRAM (TEE);  Surgeon: Orbie Pyohukkani, Arun K, MD;  [facility];  Service: Cardiovascular;  Laterality: N/A;   TONSILLECTOMY     as a child   TOTAL HIP ARTHROPLASTY Left 05/27/2022   Procedure: TOTAL HIP ARTHROPLASTY ANTERIOR APPROACH;  Surgeon: Jodi GeraldsGraves, John, MD;  Location: [facility] ORS;  Service: Orthopedics;  Laterality: Left;   VASECTOMY      Family History  Problem Relation  Age of Onset   Cancer Other        breast, porstate   Hypertension Other    Stroke Other    Breast cancer Mother    Diverticulitis Mother    Heart disease Father    Prostate cancer Father    Stroke Father    Dementia Sister    Colon cancer Neg Hx      Current Outpatient Medications:    allopurinol (ZYLOPRIM) 300 MG tablet, TAKE 1 TABLET BY MOUTH EVERY DAY, Disp: 90 tablet, Rfl: 1   apixaban (ELIQUIS) 2.5 MG TABS tablet, Take 1 tablet (2.5 mg total) by mouth 2 (two) times daily., Disp: 30 tablet, Rfl: 0   atorvastatin (LIPITOR) 40 MG tablet, TAKE 1  TABLET BY MOUTH EVERY DAY, Disp: 90 tablet, Rfl: 1   colchicine (COLCRYS) 0.6 MG tablet, Take by mouth daily., Disp: , Rfl:    cyclobenzaprine (FLEXERIL) 10 MG tablet, Take 1 tablet (10 mg total) by mouth 3 (three) times daily as needed for muscle spasms. (Patient taking differently: Take 10 mg by mouth See admin instructions. Take 1 tablet (10 mg) by mouth scheduled in the morning & take 1 tablet (10 mg) by mouth scheduled in the evening, may take an additional dose in in the afternoon if needed for spasms/pain.), Disp: 90 tablet, Rfl: 5   diazepam (VALIUM) 10 MG tablet, diazepam 10 mg tablet TAKE 1 TABLET BY MOUTH 30 MINUTES PRIOR TO MRI, Disp: , Rfl:    famotidine (PEPCID) 20 MG tablet, Take 1 tablet (20 mg total) by mouth daily after supper., Disp: 90 tablet, Rfl: 3   fluorouracil (EFUDEX) 5 % cream, Apply topically 2 (two) times daily., Disp: , Rfl:    gabapentin (NEURONTIN) 100 MG capsule, Take 1 capsule (100 mg total) by mouth 2 (two) times daily., Disp: 360 capsule, Rfl: 3   HYDROcodone-acetaminophen (NORCO/VICODIN) 5-325 MG tablet, Take 1 tablet by mouth every 4 (four) hours as needed for moderate pain., Disp: , Rfl:    lisinopril-hydrochlorothiazide (ZESTORETIC) 20-25 MG tablet, lisinopril 20 mg-hydrochlorothiazide 25 mg tablet Take 1 tablet by mouth daily., Disp: , Rfl:    Melatonin 12 MG TABS, Take 12 mg by mouth at bedtime., Disp: , Rfl:    METAMUCIL FIBER PO, Take 1 Dose by mouth See admin instructions. Take 1 dose by mouth (scheduled) daily, may take a second dose in the afternoon as needed for constipation, Disp: , Rfl:    pantoprazole (PROTONIX) 40 MG tablet, Take by mouth., Disp: , Rfl:    PARoxetine (PAXIL) 20 MG tablet, Take 1 tablet (20 mg total) by mouth every morning., Disp: 90 tablet, Rfl: 3   telmisartan-hydrochlorothiazide (MICARDIS HCT) 80-25 MG tablet, Take 1 tablet by mouth daily., Disp: 90 tablet, Rfl: 3   amoxicillin (AMOXIL) 500 MG capsule, TAKE 4 CAPSULES BY MOUTH 1  hour BEFORE dental work (Patient not taking: Reported on 02/12/2023), Disp: 4 capsule, Rfl: 3  EXAM:  VITALS per patient if applicable:  GENERAL: alert, oriented, appears well and in no acute distress  HEENT: atraumatic, conjunttiva clear, no obvious abnormalities on inspection of external nose and ears  NECK: normal movements of the head and neck  LUNGS: on inspection no signs of respiratory distress, breathing rate appears normal, no obvious gross SOB, gasping or wheezing  CV: no obvious cyanosis  MS: moves all visible extremities without noticeable abnormality  PSYCH/NEURO: pleasant and cooperative, no obvious depression or anxiety, speech and thought processing grossly intact  ASSESSMENT AND PLAN:  Pain management. Indication for chronic opioid: OA Medication and dose: Percocet 10-325 # pills per month: 120 Last UDS date: 11-13-22 Opioid Treatment Agreement signed (Y/N): 01-15-19 Opioid Treatment Agreement last reviewed with patient:  02-12-23 NCCSRS reviewed this encounter (include red flags): Yes Meds were refilled.  Gershon Crane, MD   Discussed the following assessment and plan:  No diagnosis found.     I discussed the assessment and treatment plan with the patient. The patient was provided an opportunity to ask questions and all were answered. The patient agreed with the plan and demonstrated an understanding of the instructions.   The patient was advised to call back or seek an in-person evaluation if the symptoms worsen or if the condition fails to improve as anticipated.      Review of Systems     Objective:   Physical Exam        Assessment & Plan:

## 2023-03-26 ENCOUNTER — Telehealth: Payer: Self-pay

## 2023-03-26 NOTE — Progress Notes (Signed)
Care Management & Coordination Services Pharmacy Team  Reason for Encounter: Hypertension  Contacted patient to discuss hypertension disease state. Unsuccessful outreach. Left voicemail for patient to return call.    Current antihypertensive regimen:  Telmisartan HCTZ 80/25 mg daily Patient verbally confirms he is taking the above medications as directed.   How often are you checking your Blood Pressure?   he checks his blood pressure   taking his medication.  Current home BP readings:  DATE:             BP               PULSE   Wrist or arm cuff: OTC medications including pseudoephedrine or NSAIDs?  Any readings above 180/100?  What recent interventions/DTPs have been made by any provider to improve Blood Pressure control since last CPP Visit: No recent interventions  Any recent hospitalizations or ED visits since last visit with CPP? No recent hospital visits  What diet changes have been made to improve Blood Pressure Control?  Patient follows Breakfast -  Lunch -  Dinner -  Caffeine intake: Salt intake:  What exercise is being done to improve your Blood Pressure Control?    Adherence Review: Is the patient currently on ACE/ARB medication? Yes Does the patient have >5 day gap between last estimated fill dates? No  Care Gaps: AWV -  completed 11/18/2022 Last BP - 106/74 on 02/03/2023 Covid - overdue Hep C Screen - postponed   Star Rating Drug: Atrovastatin 40 mg - last filled 01/13/2023 90 DS at [facility] Telmisartan HCTZ 80/25 mg - last filled 01/13/2023 90 DS at [facility]  Chart Updates: Recent office visits:  02/12/2023 Gershon Crane MD - Patient was seen for chronic narcotic use and additional concerns. Started Percocet 10/325 mg 1 q 6 hrs prn. Discontinued Norco.  Recent consult visits:  02/03/2023 Georgie Chard NP (cardiology) - Patient was seen for essential hypertension and additional concerns. No medication changes.  Hospital  visits:  None  Medications: Outpatient Encounter Medications as of 03/26/2023  Medication Sig   allopurinol (ZYLOPRIM) 300 MG tablet TAKE 1 TABLET BY MOUTH EVERY DAY   amoxicillin (AMOXIL) 500 MG capsule TAKE 4 CAPSULES BY MOUTH 1 hour BEFORE dental work (Patient not taking: Reported on 02/12/2023)   apixaban (ELIQUIS) 2.5 MG TABS tablet Take 1 tablet (2.5 mg total) by mouth 2 (two) times daily.   atorvastatin (LIPITOR) 40 MG tablet TAKE 1 TABLET BY MOUTH EVERY DAY   colchicine (COLCRYS) 0.6 MG tablet Take by mouth daily.   cyclobenzaprine (FLEXERIL) 10 MG tablet Take 1 tablet (10 mg total) by mouth 3 (three) times daily as needed for muscle spasms. (Patient taking differently: Take 10 mg by mouth See admin instructions. Take 1 tablet (10 mg) by mouth scheduled in the morning & take 1 tablet (10 mg) by mouth scheduled in the evening, may take an additional dose in in the afternoon if needed for spasms/pain.)   diazepam (VALIUM) 10 MG tablet diazepam 10 mg tablet TAKE 1 TABLET BY MOUTH 30 MINUTES PRIOR TO MRI   famotidine (PEPCID) 20 MG tablet Take 1 tablet (20 mg total) by mouth daily after supper.   fluorouracil (EFUDEX) 5 % cream Apply topically 2 (two) times daily.   gabapentin (NEURONTIN) 100 MG capsule Take 1 capsule (100 mg total) by mouth 2 (two) times daily.   lisinopril-hydrochlorothiazide (ZESTORETIC) 20-25 MG tablet lisinopril 20 mg-hydrochlorothiazide 25 mg tablet Take 1 tablet by mouth daily.  Melatonin 12 MG TABS Take 12 mg by mouth at bedtime.   METAMUCIL FIBER PO Take 1 Dose by mouth See admin instructions. Take 1 dose by mouth (scheduled) daily, may take a second dose in the afternoon as needed for constipation   [START ON 04/14/2023] oxyCODONE-acetaminophen (PERCOCET) 10-325 MG tablet Take 1 tablet by mouth every 6 (six) hours as needed for pain.   pantoprazole (PROTONIX) 40 MG tablet Take by mouth.   PARoxetine (PAXIL) 20 MG tablet Take 1 tablet (20 mg total) by mouth every  morning.   telmisartan-hydrochlorothiazide (MICARDIS HCT) 80-25 MG tablet Take 1 tablet by mouth daily.   No facility-administered encounter medications on file as of 03/26/2023.  Fill History:  Dispensed Days Supply Quantity Provider Pharmacy  allopurinol 300 mg tablet 01/13/2023 90 90 each      Dispensed Days Supply Quantity Provider Pharmacy  Eliquis 5 mg tablet 11/02/2021 30 60 each      Dispensed Days Supply Quantity Provider Pharmacy  cyclobenzaprine 10 mg tablet 03/01/2023 30 90 each      Dispensed Days Supply Quantity Provider Pharmacy  famotidine 20 mg tablet 08/23/2022 90 90 each      Dispensed Days Supply Quantity Provider Pharmacy  fluorouracil 5 % topical cream 03/24/2023 14 40 g      Dispensed Days Supply Quantity Provider Pharmacy  gabapentin 100 mg capsule 01/13/2023 90 360 each      Dispensed Days Supply Quantity Provider Pharmacy  lisinopril 20 mg-hydrochlorothiazide 25 mg tablet 12/01/2020 90 90 each      Dispensed Days Supply Quantity Provider Pharmacy  oxycodone-acetaminophen 10 mg-325 mg tablet 03/14/2023 30 120 each      Dispensed Days Supply Quantity Provider Pharmacy  pantoprazole 40 mg tablet,delayed release 04/23/2021 90 90 each      Dispensed Days Supply Quantity Provider Pharmacy  paroxetine 20 mg tablet 01/13/2023 90 90 each      Dispensed Days Supply Quantity Provider Pharmacy  telmisartan 80 mg-hydrochlorothiazide 25 mg tablet 01/13/2023 90 90 each     Recent Office Vitals: BP Readings from Last 3 Encounters:  02/03/23 106/74  11/13/22 112/80  09/12/22 98/76   Pulse Readings from Last 3 Encounters:  02/03/23 (!) 102  11/13/22 75  09/12/22 89    Wt Readings from Last 3 Encounters:  02/03/23 239 lb (108.4 kg)  11/19/22 240 lb 11.9 oz (109.2 kg)  11/18/22 241 lb (109.3 kg)     Kidney Function Lab Results  Component Value Date/Time   CREATININE 1.00 08/26/2022 10:35 AM   CREATININE 1.21 05/15/2022 11:04 AM   CREATININE 1.20 (H)  08/22/2020 12:08 PM   GFR 72.05 08/26/2022 10:35 AM   GFRNONAA >60 05/15/2022 11:04 AM   GFRAA >60 06/17/2019 06:05 AM       Latest Ref Rng & Units 08/26/2022   10:35 AM 05/15/2022   11:04 AM 01/25/2022   12:40 AM  BMP  Glucose 70 - 99 mg/dL 161  096  045   BUN 6 - 23 mg/dL 15  19  13    Creatinine 0.40 - 1.50 mg/dL 4.09  8.11  9.14   Sodium 135 - 145 mEq/L 138  138  138   Potassium 3.5 - 5.1 mEq/L 4.7  4.5  4.1   Chloride 96 - 112 mEq/L 104  106  106   CO2 19 - 32 mEq/L 28  25  23    Calcium 8.4 - 10.5 mg/dL 9.5  78.2  9.5    Janie Johnson CMA  Clinical Pharmacist Assistant [phone]

## 2023-04-15 ENCOUNTER — Other Ambulatory Visit: Payer: Self-pay | Admitting: Family Medicine

## 2023-04-30 ENCOUNTER — Ambulatory Visit (INDEPENDENT_AMBULATORY_CARE_PROVIDER_SITE_OTHER): Payer: Medicare Other | Admitting: Family Medicine

## 2023-04-30 ENCOUNTER — Encounter: Payer: Self-pay | Admitting: Family Medicine

## 2023-04-30 VITALS — BP 126/80 | HR 81 | Temp 97.6°F | Wt 234.0 lb

## 2023-04-30 DIAGNOSIS — F1190 Opioid use, unspecified, uncomplicated: Secondary | ICD-10-CM

## 2023-04-30 DIAGNOSIS — M159 Polyosteoarthritis, unspecified: Secondary | ICD-10-CM

## 2023-04-30 DIAGNOSIS — M940 Chondrocostal junction syndrome [Tietze]: Secondary | ICD-10-CM | POA: Diagnosis not present

## 2023-04-30 MED ORDER — OXYCODONE-ACETAMINOPHEN 10-325 MG PO TABS: 1.0000 | ORAL_TABLET | Freq: Four times a day (QID) | ORAL | 0 refills | Status: DC | PRN

## 2023-04-30 NOTE — Progress Notes (Signed)
   Subjective:    Patient ID: Ricky Meyers, male    DOB: 1946/08/15, 77 y.o.   MRN: [id number]  HPI Here for pain management. His is doing well. He does ask about a sharp pain in the center of his chest that started a week ago. He only feels this when he coughs or sneezes. No SOB. Today it feels much better.    Review of Systems  Constitutional: Negative.   Cardiovascular:  Positive for chest pain.  Musculoskeletal:  Positive for arthralgias.       Objective:   Physical Exam Constitutional:      Appearance: Normal appearance.  Cardiovascular:     Rate and Rhythm: Normal rate and regular rhythm.     Pulses: Normal pulses.     Heart sounds: Normal heart sounds.  Pulmonary:     Effort: Pulmonary effort is normal.     Breath sounds: Normal breath sounds.     Comments: He is tender along the left sternal border  Neurological:     Mental Status: He is alert.           Assessment & Plan:  Pain management.  Indication for chronic opioid: OA Medication and dose: Percocet 10-325 # pills per month: 120 Last UDS date: 11-13-22 Opioid Treatment Agreement signed (Y/N): 01-15-19 Opioid Treatment Agreement last reviewed with patient:  04-30-23 N[facility]SRS reviewed this encounter (include red flags): Yes Meds were refilled. He also has some costochondritis. This should resolve in the next few days. Gershon Crane, MD

## 2023-06-12 ENCOUNTER — Encounter: Payer: Self-pay | Admitting: Family Medicine

## 2023-06-12 DIAGNOSIS — I10 Essential (primary) hypertension: Secondary | ICD-10-CM

## 2023-06-12 MED ORDER — TELMISARTAN-HCTZ 80-25 MG PO TABS
1.0000 | ORAL_TABLET | Freq: Every day | ORAL | 3 refills | Status: DC
Start: 2023-06-12 — End: 2024-06-14

## 2023-06-12 MED ORDER — CYCLOBENZAPRINE HCL 10 MG PO TABS: 10.0000 mg | ORAL_TABLET | Freq: Three times a day (TID) | ORAL | 3 refills | Status: DC | PRN

## 2023-07-02 DIAGNOSIS — C44629 Squamous cell carcinoma of skin of left upper limb, including shoulder: Secondary | ICD-10-CM | POA: Diagnosis not present

## 2023-07-02 DIAGNOSIS — L578 Other skin changes due to chronic exposure to nonionizing radiation: Secondary | ICD-10-CM | POA: Diagnosis not present

## 2023-07-02 DIAGNOSIS — Z85828 Personal history of other malignant neoplasm of skin: Secondary | ICD-10-CM | POA: Diagnosis not present

## 2023-07-02 DIAGNOSIS — R229 Localized swelling, mass and lump, unspecified: Secondary | ICD-10-CM | POA: Diagnosis not present

## 2023-07-02 DIAGNOSIS — Z08 Encounter for follow-up examination after completed treatment for malignant neoplasm: Secondary | ICD-10-CM | POA: Diagnosis not present

## 2023-07-02 DIAGNOSIS — L821 Other seborrheic keratosis: Secondary | ICD-10-CM | POA: Diagnosis not present

## 2023-07-02 DIAGNOSIS — L570 Actinic keratosis: Secondary | ICD-10-CM | POA: Diagnosis not present

## 2023-07-02 DIAGNOSIS — L820 Inflamed seborrheic keratosis: Secondary | ICD-10-CM | POA: Diagnosis not present

## 2023-07-17 ENCOUNTER — Other Ambulatory Visit: Payer: Self-pay

## 2023-07-17 MED ORDER — PAROXETINE HCL 20 MG PO TABS: 20.0000 mg | ORAL_TABLET | Freq: Every morning | ORAL | 1 refills | Status: DC

## 2023-07-17 MED ORDER — ATORVASTATIN CALCIUM 40 MG PO TABS: 40.0000 mg | ORAL_TABLET | Freq: Every day | ORAL | 1 refills | Status: DC

## 2023-08-11 ENCOUNTER — Encounter: Payer: Self-pay | Admitting: Family Medicine

## 2023-08-11 ENCOUNTER — Ambulatory Visit (INDEPENDENT_AMBULATORY_CARE_PROVIDER_SITE_OTHER): Payer: Medicare Other | Admitting: Family Medicine

## 2023-08-11 VITALS — BP 110/64 | HR 89 | Temp 97.9°F | Wt 237.0 lb

## 2023-08-11 DIAGNOSIS — M159 Polyosteoarthritis, unspecified: Secondary | ICD-10-CM

## 2023-08-11 DIAGNOSIS — F1190 Opioid use, unspecified, uncomplicated: Secondary | ICD-10-CM | POA: Diagnosis not present

## 2023-08-11 MED ORDER — OXYCODONE-ACETAMINOPHEN 10-325 MG PO TABS: 1.0000 | ORAL_TABLET | Freq: Four times a day (QID) | ORAL | 0 refills | Status: DC | PRN

## 2023-08-11 NOTE — Progress Notes (Signed)
Subjective:    Patient ID: Ricky Meyers, male    DOB: [birth year], 77 y.o.   MRN: [id number]  HPI Here for pain management. He is doing well.    Review of Systems  Constitutional: Negative.   Musculoskeletal:  Positive for arthralgias and back pain.       Objective:   Physical Exam Constitutional:      Appearance: Normal appearance.  Neurological:     Mental Status: He is alert.           Assessment & Plan:  Pain management.  Indication for chronic opioid: OA Medication and dose: Percocet 10-325 # pills per month: 120 Last UDS date: 11-13-22 Opioid Treatment Agreement signed (Y/N): 01-15-19 Opioid Treatment Agreement last reviewed with patient:  08-11-23 NCCSRS reviewed this encounter (include red flags): Yes Meds were refilled.  Gershon Crane, MD

## 2023-09-08 ENCOUNTER — Telehealth: Payer: Self-pay | Admitting: Family Medicine

## 2023-09-08 NOTE — Telephone Encounter (Addendum)
Pt called [place] the [place] and would like his refill for oxyCODONE-acetaminophen (PERCOCET) 10-325 MG tablet sent to:  [facility] [place], [place] [address] [address] Phone: [phone]  Fax: [phone]     Please advise.

## 2023-09-09 NOTE — Telephone Encounter (Signed)
Pt LOV was on 08/11/2023 Last refill done on the same day Please advise

## 2023-09-10 ENCOUNTER — Encounter: Payer: Self-pay | Admitting: Family Medicine

## 2023-09-10 MED ORDER — OXYCODONE-ACETAMINOPHEN 10-325 MG PO TABS: 1.0000 | ORAL_TABLET | Freq: Four times a day (QID) | ORAL | 0 refills | Status: DC | PRN

## 2023-09-10 NOTE — Telephone Encounter (Signed)
Pt is out of town and requests a few oxycodone's sent to the above pharmacy. Please advise

## 2023-09-10 NOTE — Telephone Encounter (Signed)
I sent #[phone] to the [facility] pharmacy

## 2023-09-10 NOTE — Telephone Encounter (Signed)
Pt requesting a call when this is done

## 2023-09-11 NOTE — Telephone Encounter (Signed)
Sent a MyChart message to pt 

## 2023-09-11 NOTE — Telephone Encounter (Signed)
This has already been sent by Dr Clent Ridges

## 2023-10-06 NOTE — Progress Notes (Unsigned)
Cardiology Office Note:  .   Date:  10/07/2023  ID:  Baraa, Meaders December 07, 1945, MRN [id number] PCP: Nelwyn Salisbury, MD  [facility] Providers Cardiologist:  Lance Muss, MD Structural Heart:  Orbie Pyo, MD{ History of Present Illness: INFANT SCHULZE is a 77 y.o. male with below history who presents for follow-up.   History of Present Illness   Mr. Christella Noa, a 77 year old male with a history of severe mitral regurgitation status post mitral clip, non-obstructive CAD, hypertension, hyperlipidemia, and DVT/PE, presents for follow-up. He reports feeling short of breath, particularly when attempting to ride a bicycle. He admits to being less active than he should be, attributing this to retirement and laziness. He denies any history of heart attack, stroke, diabetes, or congestive heart failure. He is currently on Eliquis for blood clot prevention and Lipitor for hyperlipidemia. His blood pressure is well controlled on his current regimen.          Problem List Non-obstructive CAD -LHC 12/2021: 25% D1 2. Severe mitral regurgitation  -TEER 01/2022 -mild to mod residual MR 3. HTN 4. HLD -T chol 160, HDL 66, LDL 71, TG 118 5. DVT/PE 6. LBBB    ROS: All other ROS reviewed and negative. Pertinent positives noted in the HPI.     Studies Reviewed: Marland Kitchen   EKG Interpretation Date/Time:  Tuesday October 07 2023 13:12:02 EST Ventricular Rate:  75 PR Interval:  192 QRS Duration:  132 QT Interval:  418 QTC Calculation: 466 R Axis:   -3  Text Interpretation: Normal sinus rhythm Left bundle branch block Confirmed by Lennie Odor [phone]) on 10/07/2023 1:13:19 PM   Physical Exam:   VS:  BP 132/78 (BP Location: Left Arm, Patient Position: Sitting, Cuff Size: Normal)   Pulse 75   Ht 6' (1.829 m)   Wt 238 lb (108 kg)   BMI 32.28 kg/m    Wt Readings from Last 3 Encounters:  10/07/23 238 lb (108 kg)  08/11/23 237 lb (107.5 kg)  04/30/23 234 lb (106.1 kg)    GEN:  Well nourished, well developed in no acute distress NECK: No JVD; No carotid bruits CARDIAC: RRR, no murmurs, rubs, gallops RESPIRATORY:  Clear to auscultation without rales, wheezing or rhonchi  ABDOMEN: Soft, non-tender, non-distended EXTREMITIES:  No edema; No deformity  ASSESSMENT AND PLAN: .   Assessment and Plan    Severe Mitral Regurgitation status post Mitral Clip Mild to moderate residual mitral regurgitation on recent echo. Shortness of breath likely due to physical deconditioning rather than cardiac etiology. -Increase physical activity. -continue ASA 81 mg dialy. SBE ppx.   Non-obstructive CAD No symptoms of angina. Lipids at goal with LDL of 71 on Lipitor 40mg  daily. Minimal nonobstructive disease on left heart catheterization in 2023. -Continue Lipitor 40mg  daily.  Hypertension Well controlled on current regimen. No signs of volume overload. -Continue current antihypertensive regimen.  DVT/PE On Eliquis for prevention. Kidneys in good condition. -Continue Eliquis.              Follow-up: Return in about 1 year (around 10/06/2024).  Time Spent with Patient: I have spent a total of 25 minutes caring for this patient today face to face, ordering and reviewing labs/tests, reviewing prior records/medical history, examining the patient, establishing an assessment and plan, communicating results/findings to the patient/family, and documenting in the medical record.   Signed, Lenna Gilford. Flora Lipps, MD, [facility]  [facility]  [address]  [place], [place] [address] [phone]  1:33 PM

## 2023-10-07 ENCOUNTER — Ambulatory Visit: Payer: Medicare Other | Attending: Cardiovascular Disease | Admitting: Cardiovascular Disease

## 2023-10-07 ENCOUNTER — Encounter: Payer: Self-pay | Admitting: Cardiovascular Disease

## 2023-10-07 VITALS — BP 132/78 | HR 75 | Ht 72.0 in | Wt 238.0 lb

## 2023-10-07 DIAGNOSIS — Z9889 Other specified postprocedural states: Secondary | ICD-10-CM | POA: Diagnosis not present

## 2023-10-07 DIAGNOSIS — I2510 Atherosclerotic heart disease of native coronary artery without angina pectoris: Secondary | ICD-10-CM | POA: Diagnosis not present

## 2023-10-07 DIAGNOSIS — I10 Essential (primary) hypertension: Secondary | ICD-10-CM | POA: Insufficient documentation

## 2023-10-07 DIAGNOSIS — I340 Nonrheumatic mitral (valve) insufficiency: Secondary | ICD-10-CM | POA: Diagnosis not present

## 2023-10-07 DIAGNOSIS — R0602 Shortness of breath: Secondary | ICD-10-CM | POA: Diagnosis not present

## 2023-10-07 DIAGNOSIS — Z95818 Presence of other cardiac implants and grafts: Secondary | ICD-10-CM | POA: Diagnosis not present

## 2023-10-07 DIAGNOSIS — E782 Mixed hyperlipidemia: Secondary | ICD-10-CM | POA: Insufficient documentation

## 2023-10-07 NOTE — Patient Instructions (Signed)
Medication Instructions:  Your physician recommends that you continue on your current medications as directed. Please refer to the Current Medication list given to you today.    *If you need a refill on your cardiac medications before your next appointment, please call your pharmacy*   Lab Work: None    If you have labs (blood work) drawn today and your tests are completely normal, you will receive your results only by: MyChart Message (if you have MyChart) OR A paper copy in the mail If you have any lab test that is abnormal or we need to change your treatment, we will call you to review the results.   Testing/Procedures: None    Follow-Up: At [facility], you and your health needs are our priority.  As part of our continuing mission to provide you with exceptional heart care, we have created designated Provider Care Teams.  These Care Teams include your primary Cardiologist (physician) and Advanced Practice Providers (APPs -  Physician Assistants and Nurse Practitioners) who all work together to provide you with the care you need, when you need it.  We recommend signing up for the patient portal called "MyChart".  Sign up information is provided on this After Visit Summary.  MyChart is used to connect with patients for Virtual Visits (Telemedicine).  Patients are able to view lab/test results, encounter notes, upcoming appointments, etc.  Non-urgent messages can be sent to your provider as well.   To learn more about what you can do with MyChart, go to [email or web address].    Your next appointment:   1 year(s)  The format for your next appointment:   In Person  Provider:   Lennie Odor, MD   Other Instructions

## 2023-10-10 ENCOUNTER — Other Ambulatory Visit: Payer: Self-pay | Admitting: Family Medicine

## 2023-10-20 ENCOUNTER — Telehealth: Payer: Self-pay

## 2023-10-20 NOTE — Telephone Encounter (Signed)
Mail pt portion and fax Dr portion 10/20/23 on [employer](Ele[employer])

## 2023-10-22 ENCOUNTER — Ambulatory Visit (INDEPENDENT_AMBULATORY_CARE_PROVIDER_SITE_OTHER): Payer: Medicare Other

## 2023-10-22 DIAGNOSIS — Z86711 Personal history of pulmonary embolism: Secondary | ICD-10-CM

## 2023-10-22 NOTE — Progress Notes (Signed)
   10/22/2023  Patient ID: Ricky Meyers, male   DOB: 04-Jun-1946, 77 y.o.   MRN: [id number]  Patient presented in office for assistance renewing his Eliquis assistance through [employer] for 2025. Completed appliation with PCP sign off and submitted. Pending company response.  Sherrill Raring, PharmD Clinical Pharmacist [phone]

## 2023-10-31 ENCOUNTER — Encounter: Payer: Self-pay | Admitting: Family Medicine

## 2023-10-31 ENCOUNTER — Ambulatory Visit (INDEPENDENT_AMBULATORY_CARE_PROVIDER_SITE_OTHER): Payer: Medicare Other | Admitting: Family Medicine

## 2023-10-31 VITALS — BP 118/74 | HR 71 | Temp 97.6°F | Ht 72.0 in | Wt 239.0 lb

## 2023-10-31 DIAGNOSIS — M109 Gout, unspecified: Secondary | ICD-10-CM

## 2023-10-31 DIAGNOSIS — I2510 Atherosclerotic heart disease of native coronary artery without angina pectoris: Secondary | ICD-10-CM

## 2023-10-31 DIAGNOSIS — G4733 Obstructive sleep apnea (adult) (pediatric): Secondary | ICD-10-CM

## 2023-10-31 DIAGNOSIS — I10 Essential (primary) hypertension: Secondary | ICD-10-CM | POA: Diagnosis not present

## 2023-10-31 DIAGNOSIS — R739 Hyperglycemia, unspecified: Secondary | ICD-10-CM

## 2023-10-31 DIAGNOSIS — D509 Iron deficiency anemia, unspecified: Secondary | ICD-10-CM | POA: Diagnosis not present

## 2023-10-31 DIAGNOSIS — Z8546 Personal history of malignant neoplasm of prostate: Secondary | ICD-10-CM | POA: Diagnosis not present

## 2023-10-31 DIAGNOSIS — M159 Polyosteoarthritis, unspecified: Secondary | ICD-10-CM

## 2023-10-31 DIAGNOSIS — F32A Depression, unspecified: Secondary | ICD-10-CM

## 2023-10-31 DIAGNOSIS — E782 Mixed hyperlipidemia: Secondary | ICD-10-CM

## 2023-10-31 DIAGNOSIS — K5900 Constipation, unspecified: Secondary | ICD-10-CM | POA: Diagnosis not present

## 2023-10-31 DIAGNOSIS — F1190 Opioid use, unspecified, uncomplicated: Secondary | ICD-10-CM

## 2023-10-31 DIAGNOSIS — I2699 Other pulmonary embolism without acute cor pulmonale: Secondary | ICD-10-CM

## 2023-10-31 LAB — HEPATIC FUNCTION PANEL
ALT: 21 U/L (ref 0–53)
AST: 24 U/L (ref 0–37)
Albumin: 4.3 g/dL (ref 3.5–5.2)
Alkaline Phosphatase: 57 U/L (ref 39–117)
Bilirubin, Direct: 0.1 mg/dL (ref 0.0–0.3)
Total Bilirubin: 0.7 mg/dL (ref 0.2–1.2)
Total Protein: 6.9 g/dL (ref 6.0–8.3)

## 2023-10-31 LAB — CBC WITH DIFFERENTIAL/PLATELET
Basophils Absolute: 0 10*3/uL (ref 0.0–0.1)
Basophils Relative: 0.5 % (ref 0.0–3.0)
Eosinophils Absolute: 0.1 10*3/uL (ref 0.0–0.7)
Eosinophils Relative: 2.4 % (ref 0.0–5.0)
HCT: 41.5 % (ref 39.0–52.0)
Hemoglobin: 14.1 g/dL (ref 13.0–17.0)
Lymphocytes Relative: 27.9 % (ref 12.0–46.0)
Lymphs Abs: 1.5 10*3/uL (ref 0.7–4.0)
MCHC: 33.9 g/dL (ref 30.0–36.0)
MCV: 97.3 fL (ref 78.0–100.0)
Monocytes Absolute: 0.6 10*3/uL (ref 0.1–1.0)
Monocytes Relative: 10.3 % (ref 3.0–12.0)
Neutro Abs: 3.2 10*3/uL (ref 1.4–7.7)
Neutrophils Relative %: 58.9 % (ref 43.0–77.0)
Platelets: 154 10*3/uL (ref 150.0–400.0)
RBC: 4.26 Mil/uL (ref 4.22–5.81)
RDW: 14.5 % (ref 11.5–15.5)
WBC: 5.4 10*3/uL (ref 4.0–10.5)

## 2023-10-31 LAB — BASIC METABOLIC PANEL
BUN: 23 mg/dL (ref 6–23)
CO2: 30 meq/L (ref 19–32)
Calcium: 9.5 mg/dL (ref 8.4–10.5)
Chloride: 101 meq/L (ref 96–112)
Creatinine, Ser: 1.03 mg/dL (ref 0.40–1.50)
GFR: 68.97 mL/min (ref >60.00)
Glucose, Bld: 102 mg/dL — ABNORMAL HIGH (ref 70–99)
Potassium: 4.4 meq/L (ref 3.5–5.1)
Sodium: 139 meq/L (ref 135–145)

## 2023-10-31 LAB — HEMOGLOBIN A1C: Hgb A1c MFr Bld: 5.9 % (ref 4.6–6.5)

## 2023-10-31 LAB — LIPID PANEL
Cholesterol: 155 mg/dL (ref 0–200)
HDL: 64.4 mg/dL (ref >39.00)
LDL Cholesterol: 70 mg/dL (ref 0–99)
NonHDL: 90.4
Total CHOL/HDL Ratio: 2
Triglycerides: 103 mg/dL (ref 0.0–149.0)
VLDL: 20.6 mg/dL (ref 0.0–40.0)

## 2023-10-31 LAB — URIC ACID: Uric Acid, Serum: 4.6 mg/dL (ref 4.0–7.8)

## 2023-10-31 LAB — PSA: PSA: 0.04 ng/mL — ABNORMAL LOW (ref 0.10–4.00)

## 2023-10-31 LAB — TSH: TSH: 1.32 u[IU]/mL (ref 0.35–5.50)

## 2023-10-31 NOTE — Progress Notes (Signed)
Subjective:    Patient ID: Ricky Meyers, male    DOB: 10-24-1946, 77 y.o.   MRN: [id number]  HPI Here to follow up on issues. He feels well in general although he knows he is overweight. He is in a pain management program with [place] for OA. His BP is stable. He saw Dr. Flora Lipps on 10-07-23, and he was pleased with Aime's condition. He had an ECHO on 02-03-23 that showed an EF of 60-65%. His mitral valve clips were in place and working well. His mitral regurgitation was rated as mild to moderate with no stenosis. His gout and depression are well controlled. He remains on Eliquis for his hx of PE 's..    Review of Systems  Constitutional: Negative.   HENT: Negative.    Eyes: Negative.   Respiratory: Negative.    Cardiovascular: Negative.   Gastrointestinal: Negative.   Genitourinary: Negative.   Musculoskeletal:  Positive for arthralgias and back pain.  Skin: Negative.   Neurological: Negative.   Psychiatric/Behavioral: Negative.         Objective:   Physical Exam Constitutional:      General: He is not in acute distress.    Appearance: Normal appearance. He is well-developed. He is not diaphoretic.  HENT:     Head: Normocephalic and atraumatic.     Right Ear: External ear normal.     Left Ear: External ear normal.     Nose: Nose normal.     Mouth/Throat:     Pharynx: No oropharyngeal exudate.  Eyes:     General: No scleral icterus.       Right eye: No discharge.        Left eye: No discharge.     Conjunctiva/sclera: Conjunctivae normal.     Pupils: Pupils are equal, round, and reactive to light.  Neck:     Thyroid: No thyromegaly.     Vascular: No JVD.     Trachea: No tracheal deviation.  Cardiovascular:     Rate and Rhythm: Normal rate and regular rhythm.     Pulses: Normal pulses.     Heart sounds: Normal heart sounds. No murmur heard.    No friction rub. No gallop.  Pulmonary:     Effort: Pulmonary effort is normal. No respiratory distress.     Breath sounds:  Normal breath sounds. No wheezing or rales.  Chest:     Chest wall: No tenderness.  Abdominal:     General: Bowel sounds are normal. There is no distension.     Palpations: Abdomen is soft. There is no mass.     Tenderness: There is no abdominal tenderness. There is no guarding or rebound.  Genitourinary:    Penis: Normal. No tenderness.      Testes: Normal.  Musculoskeletal:        General: No tenderness. Normal range of motion.     Cervical back: Neck supple.  Lymphadenopathy:     Cervical: No cervical adenopathy.  Skin:    General: Skin is warm and dry.     Coloration: Skin is not pale.     Findings: No erythema or rash.  Neurological:     General: No focal deficit present.     Mental Status: He is alert and oriented to person, place, and time.     Cranial Nerves: No cranial nerve deficit.     Motor: No abnormal muscle tone.     Coordination: Coordination normal.     Deep Tendon Reflexes: Reflexes  are normal and symmetric. Reflexes normal.  Psychiatric:        Mood and Affect: Mood normal.        Behavior: Behavior normal.        Thought Content: Thought content normal.        Judgment: Judgment normal.           Assessment & Plan:  His HTN  and CAD are stable, and his mitral clips are performing as expected. His depression and gout are well controlled. His OA pains are managed by his pain medications. We will check lipids, PSA, etc. We spent a total of ( 35  ) minutes reviewing records and discussing these issues.  Gershon Crane, MD

## 2023-11-02 LAB — DRUG MONITOR, PANEL 1, W/CONF, URINE
Amphetamines: NEGATIVE ng/mL (ref <500)
Barbiturates: NEGATIVE ng/mL (ref <300)
Benzodiazepines: NEGATIVE ng/mL (ref <100)
Cocaine Metabolite: NEGATIVE ng/mL (ref <150)
Codeine: NEGATIVE ng/mL (ref <50)
Creatinine: 105.5 mg/dL (ref >=20.0)
Hydrocodone: NEGATIVE ng/mL (ref <50)
Hydromorphone: NEGATIVE ng/mL (ref <50)
Marijuana Metabolite: NEGATIVE ng/mL (ref <20)
Methadone Metabolite: NEGATIVE ng/mL (ref <100)
Morphine: NEGATIVE ng/mL (ref <50)
Norhydrocodone: NEGATIVE ng/mL (ref <50)
Noroxycodone: 3339 ng/mL — ABNORMAL HIGH (ref <50)
Opiates: NEGATIVE ng/mL (ref <100)
Oxidant: NEGATIVE ug/mL (ref <200)
Oxycodone: 1098 ng/mL — ABNORMAL HIGH (ref <50)
Oxycodone: POSITIVE ng/mL — AB (ref <100)
Oxymorphone: 332 ng/mL — ABNORMAL HIGH (ref <50)
Phencyclidine: NEGATIVE ng/mL (ref <25)
pH: 6.9 (ref 4.5–9.0)

## 2023-11-02 LAB — DM TEMPLATE

## 2023-11-04 DIAGNOSIS — H2513 Age-related nuclear cataract, bilateral: Secondary | ICD-10-CM | POA: Diagnosis not present

## 2023-11-04 DIAGNOSIS — H25013 Cortical age-related cataract, bilateral: Secondary | ICD-10-CM | POA: Diagnosis not present

## 2023-11-04 DIAGNOSIS — H18413 Arcus senilis, bilateral: Secondary | ICD-10-CM | POA: Diagnosis not present

## 2023-11-04 DIAGNOSIS — H2512 Age-related nuclear cataract, left eye: Secondary | ICD-10-CM | POA: Diagnosis not present

## 2023-11-04 DIAGNOSIS — H25043 Posterior subcapsular polar age-related cataract, bilateral: Secondary | ICD-10-CM | POA: Diagnosis not present

## 2023-11-07 ENCOUNTER — Telehealth (INDEPENDENT_AMBULATORY_CARE_PROVIDER_SITE_OTHER): Payer: Medicare Other | Admitting: Family Medicine

## 2023-11-07 ENCOUNTER — Encounter: Payer: Self-pay | Admitting: Family Medicine

## 2023-11-07 ENCOUNTER — Other Ambulatory Visit: Payer: Self-pay | Admitting: Family Medicine

## 2023-11-07 DIAGNOSIS — F1190 Opioid use, unspecified, uncomplicated: Secondary | ICD-10-CM | POA: Diagnosis not present

## 2023-11-07 DIAGNOSIS — M159 Polyosteoarthritis, unspecified: Secondary | ICD-10-CM

## 2023-11-07 MED ORDER — OXYCODONE-ACETAMINOPHEN 10-325 MG PO TABS: 1.0000 | ORAL_TABLET | Freq: Four times a day (QID) | ORAL | 0 refills | Status: DC | PRN

## 2023-11-07 NOTE — Progress Notes (Signed)
 Subjective:    Patient ID: Ricky Meyers, male    DOB: 1945/12/30, 78 y.o.   MRN: [id number]  HPI Virtual Visit via Video Note  I connected with the patient on 11/07/23 at  9:30 AM EST by a video enabled telemedicine application and verified that I am speaking with the correct person using two identifiers.  Location patient: home Location provider:work or home office Persons participating in the virtual visit: patient, provider  I discussed the limitations of evaluation and management by telemedicine and the availability of in person appointments. The patient expressed understanding and agreed to proceed.   HPI: Here for pain management, he is doing well.    ROS: See pertinent positives and negatives per HPI.  Past Medical History:  Diagnosis Date   Arthritis    neck and back    Chronic neck pain    Depression    ED (erectile dysfunction)    GERD (gastroesophageal reflux disease)    dysphagia   Gout    sees Dr. [name]    Hyperlipidemia    Hypertension    OSA (obstructive sleep apnea) 12/18/2017   Severe with AHI at 70.1/hr and is now on CPAP at 11cm H2o   Prostate cancer [facility])    history of   Pulmonary embolism (HCC) 06/2019   S/P mitral valve clip implantation 01/24/2022   s/p Mitraclip XTW + NTW with Dr. Wonda and Dr. Wendel   Sleep apnea    snoring/never checked    Past Surgical History:  Procedure Laterality Date   AMPUTATION Left 07/18/2018   Procedure: LEFT RING FINGER REVISION AMPUTATION;  Surgeon: Murrell Drivers, MD;  Location: [facility] OR;  Service: Orthopedics;  Laterality: Left;   BACK SURGERY     CARDIAC CATHETERIZATION     CERVICAL FUSION  12/02/2007   per Dr. Alm Molt   COLONOSCOPY  03/11/2017   per Dr. Teressa, clear, no repeats needed    injection lower back Right 09/17/2016   IR IVC FILTER PLMT / S&I /IMG GUID/MOD SED  11/15/2021   IR IVC FILTER RETRIEVAL / S&I /IMG GUID/MOD SED  12/14/2021   IR RADIOLOGIST EVAL & MGMT  10/30/2021    LUMBAR LAMINECTOMY/DECOMPRESSION MICRODISCECTOMY Left 11/21/2021   Procedure: Laminectomy and Foraminotomy - left - Lumbar three-four;  Surgeon: Molt Alm RAMAN, MD;  Location: [facility] OR;  Service: Neurosurgery;  Laterality: Left;   PROSTATECTOMY  2000   RIGHT/LEFT HEART CATH AND CORONARY ANGIOGRAPHY N/A 12/20/2021   Procedure: RIGHT/LEFT HEART CATH AND CORONARY ANGIOGRAPHY;  Surgeon: [name], MD;  Location: [facility] INVASIVE CV LAB;  Service: Cardiovascular;  Laterality: N/A;   ROTATOR CUFF REPAIR Right 11/2017   TEE WITHOUT CARDIOVERSION N/A 01/01/2022   Procedure: TRANSESOPHAGEAL ECHOCARDIOGRAM (TEE);  Surgeon: [name], MD;  Location: [facility] ENDOSCOPY;  Service: Cardiovascular;  Laterality: N/A;   TEE WITHOUT CARDIOVERSION N/A 01/24/2022   Procedure: TRANSESOPHAGEAL ECHOCARDIOGRAM (TEE);  Surgeon: Thukkani, Arun K, MD;  Location: [facility] INVASIVE CV LAB;  Service: Cardiovascular;  Laterality: N/A;   TONSILLECTOMY     as a child   TOTAL HIP ARTHROPLASTY Left 05/27/2022   Procedure: TOTAL HIP ARTHROPLASTY ANTERIOR APPROACH;  Surgeon: [name], MD;  Location: [facility] ORS;  Service: Orthopedics;  Laterality: Left;   TRANSCATHETER MITRAL EDGE TO EDGE REPAIR N/A 01/24/2022   Procedure: MITRAL VALVE REPAIR;  Surgeon: [name], MD;  Location: [facility] INVASIVE CV LAB;  Service: Cardiovascular;  Laterality: N/A;   VASECTOMY  Family History  Problem Relation Age of Onset   Cancer Other        breast, porstate   Hypertension Other    Stroke Other    Breast cancer Mother    Diverticulitis Mother    Heart disease Father    Prostate cancer Father    Stroke Father    Dementia Sister    Colon cancer Neg Hx      Current Outpatient Medications:    allopurinol  (ZYLOPRIM ) 300 MG tablet, TAKE 1 TABLET BY MOUTH EVERY DAY, Disp: 90 tablet, Rfl: 1   amoxicillin  (AMOXIL ) 500 MG capsule, TAKE 4 CAPSULES BY MOUTH 1 hour BEFORE dental work, Disp: 4 capsule, Rfl: 3   apixaban  (ELIQUIS ) 2.5 MG TABS  tablet, Take 1 tablet (2.5 mg total) by mouth 2 (two) times daily., Disp: 30 tablet, Rfl: 0   atorvastatin  (LIPITOR) 40 MG tablet, Take 1 tablet (40 mg total) by mouth daily., Disp: 90 tablet, Rfl: 1   colchicine  (COLCRYS ) 0.6 MG tablet, Take by mouth daily., Disp: , Rfl:    cyclobenzaprine  (FLEXERIL ) 10 MG tablet, Take 1 tablet (10 mg total) by mouth 3 (three) times daily as needed for muscle spasms., Disp: 90 tablet, Rfl: 3   diazepam  (VALIUM ) 10 MG tablet, diazepam  10 mg tablet TAKE 1 TABLET BY MOUTH 30 MINUTES PRIOR TO MRI, Disp: , Rfl:    famotidine  (PEPCID ) 20 MG tablet, Take 1 tablet (20 mg total) by mouth daily after supper., Disp: 90 tablet, Rfl: 3   fluorouracil (EFUDEX) 5 % cream, Apply topically 2 (two) times daily., Disp: , Rfl:    gabapentin  (NEURONTIN ) 100 MG capsule, Take 1 capsule (100 mg total) by mouth 2 (two) times daily., Disp: 360 capsule, Rfl: 3   Melatonin 12 MG TABS, Take 12 mg by mouth at bedtime., Disp: , Rfl:    METAMUCIL FIBER PO, Take 1 Dose by mouth See admin instructions. Take 1 dose by mouth (scheduled) daily, may take a second dose in the afternoon as needed for constipation, Disp: , Rfl:    oxyCODONE -acetaminophen  (PERCOCET) 10-325 MG tablet, Take 1 tablet by mouth every 6 (six) hours as needed for pain., Disp: 120 tablet, Rfl: 0   oxyCODONE -acetaminophen  (PERCOCET) 10-325 MG tablet, Take 1 tablet by mouth every 6 (six) hours as needed for pain., Disp: 120 tablet, Rfl: 0   oxyCODONE -acetaminophen  (PERCOCET) 10-325 MG tablet, Take 1 tablet by mouth every 6 (six) hours as needed for pain., Disp: 60 tablet, Rfl: 0   pantoprazole  (PROTONIX ) 40 MG tablet, Take by mouth., Disp: , Rfl:    PARoxetine  (PAXIL ) 20 MG tablet, Take 1 tablet (20 mg total) by mouth every morning., Disp: 90 tablet, Rfl: 1   telmisartan -hydrochlorothiazide  (MICARDIS  HCT) 80-25 MG tablet, Take 1 tablet by mouth daily., Disp: 90 tablet, Rfl: 3  EXAM:  VITALS per patient if applicable:  GENERAL:  alert, oriented, appears well and in no acute distress  HEENT: atraumatic, conjunttiva clear, no obvious abnormalities on inspection of external nose and ears  NECK: normal movements of the head and neck  LUNGS: on inspection no signs of respiratory distress, breathing rate appears normal, no obvious gross SOB, gasping or wheezing  CV: no obvious cyanosis  MS: moves all visible extremities without noticeable abnormality  PSYCH/NEURO: pleasant and cooperative, no obvious depression or anxiety, speech and thought processing grossly intact  ASSESSMENT AND PLAN: Pain management. Indication for chronic opioid: OA Medication and dose: Percocet 10-325 # pills per month: 120 Last UDS  date: 10-31-23 Opioid Treatment Agreement signed (Y/N): 01-15-19 Opioid Treatment Agreement last reviewed with patient:  11-07-23 [facility] reviewed this encounter (include red flags): Yes Meds were refilled.  Garnette Olmsted, MD  Discussed the following assessment and plan:  No diagnosis found.     I discussed the assessment and treatment plan with the patient. The patient was provided an opportunity to ask questions and all were answered. The patient agreed with the plan and demonstrated an understanding of the instructions.   The patient was advised to call back or seek an in-person evaluation if the symptoms worsen or if the condition fails to improve as anticipated.      Review of Systems     Objective:   Physical Exam        Assessment & Plan:

## 2023-12-01 ENCOUNTER — Ambulatory Visit (INDEPENDENT_AMBULATORY_CARE_PROVIDER_SITE_OTHER): Payer: Medicare Other

## 2023-12-01 VITALS — Ht 73.0 in | Wt 230.0 lb

## 2023-12-01 DIAGNOSIS — Z0000 Encounter for general adult medical examination without abnormal findings: Secondary | ICD-10-CM | POA: Diagnosis not present

## 2023-12-01 NOTE — Patient Instructions (Addendum)
Ricky Meyers , Thank you for taking time to come for your Medicare Wellness Visit. I appreciate your ongoing commitment to your health goals. Please review the following plan we discussed and let me know if I can assist you in the future.   Referrals/Orders/Follow-Ups/Clinician Recommendations:   This is a list of the screening recommended for you and due dates:  Health Maintenance  Topic Date Due   Hepatitis C Screening  Never done   COVID-19 Vaccine (4 - 2024-25 season) 07/06/2023   Medicare Annual Wellness Visit  11/30/2024   DTaP/Tdap/Td vaccine (3 - Td or Tdap) 07/18/2028   Pneumonia Vaccine  Completed   Flu Shot  Completed   Zoster (Shingles) Vaccine  Completed   HPV Vaccine  Aged Out   Colon Cancer Screening  Discontinued   Opioid Pain Medicine Management Opioids are powerful medicines that are used to treat moderate to severe pain. When used for short periods of time, they can help you to: Sleep better. Do better in physical or occupational therapy. Feel better in the first few days after an injury. Recover from surgery. Opioids should be taken with the supervision of a trained health care provider. They should be taken for the shortest period of time possible. This is because opioids can be addictive, and the longer you take opioids, the greater your risk of addiction. This addiction can also be called opioid use disorder. What are the risks? Using opioid pain medicines for longer than 3 days increases your risk of side effects. Side effects include: Constipation. Nausea and vomiting. Breathing difficulties (respiratory depression). Drowsiness. Confusion. Opioid use disorder. Itching. Taking opioid pain medicine for a long period of time can affect your ability to do daily tasks. It also puts you at risk for: Motor vehicle crashes. Depression. Suicide. Heart attack. Overdose, which can be life-threatening. What is a pain treatment plan? A pain treatment plan is an  agreement between you and your health care provider. Pain is unique to each person, and treatments vary depending on your condition. To manage your pain, you and your health care provider need to work together. To help you do this: Discuss the goals of your treatment, including how much pain you might expect to have and how you will manage the pain. Review the risks and benefits of taking opioid medicines. Remember that a good treatment plan uses more than one approach and minimizes the chance of side effects. Be honest about the amount of medicines you take and about any drug or alcohol use. Get pain medicine prescriptions from only one health care provider. Pain can be managed with many types of alternative treatments. Ask your health care provider to refer you to one or more specialists who can help you manage pain through: Physical or occupational therapy. Counseling (cognitive behavioral therapy). Good nutrition. Biofeedback. Massage. Meditation. Non-opioid medicine. Following a gentle exercise program. How to use opioid pain medicine Taking medicine Take your pain medicine exactly as told by your health care provider. Take it only when you need it. If your pain gets less severe, you may take less than your prescribed dose if your health care provider approves. If you are not having pain, do nottake pain medicine unless your health care provider tells you to take it. If your pain is severe, do nottry to treat it yourself by taking more pills than instructed on your prescription. Contact your health care provider for help. Write down the times when you take your pain medicine. It is easy  to become confused while on pain medicine. Writing the time can help you avoid overdose. Take other over-the-counter or prescription medicines only as told by your health care provider. Keeping yourself and others safe  While you are taking opioid pain medicine: Do not drive, use machinery, or power  tools. Do not sign legal documents. Do not drink alcohol. Do not take sleeping pills. Do not supervise children by yourself. Do not do activities that require climbing or being in high places. Do not go to a lake, river, ocean, spa, or swimming pool. Do not share your pain medicine with anyone. Keep pain medicine in a locked cabinet or in a secure area where pets and children cannot reach it. Stopping your use of opioids If you have been taking opioid medicine for more than a few weeks, you may need to slowly decrease (taper) how much you take until you stop completely. Tapering your use of opioids can decrease your risk of symptoms of withdrawal, such as: Pain and cramping in the abdomen. Nausea. Sweating. Sleepiness. Restlessness. Uncontrollable shaking (tremors). Cravings for the medicine. Do not attempt to taper your use of opioids on your own. Talk with your health care provider about how to do this. Your health care provider may prescribe a step-down schedule based on how much medicine you are taking and how long you have been taking it. Getting rid of leftover pills Do not save any leftover pills. Get rid of leftover pills safely by: Taking the medicine to a prescription take-back program. This is usually offered by the county or law enforcement. Bringing them to a pharmacy that has a drug disposal container. Flushing them down the toilet. Check the label or package insert of your medicine to see whether this is safe to do. Throwing them out in the trash. Check the label or package insert of your medicine to see whether this is safe to do. If it is safe to throw it out, remove the medicine from the original container, put it into a sealable bag or container, and mix it with used coffee grounds, food scraps, dirt, or cat litter before putting it in the trash. Follow these instructions at home: Activity Do exercises as told by your health care provider. Avoid activities that make  your pain worse. Return to your normal activities as told by your health care provider. Ask your health care provider what activities are safe for you. General instructions You may need to take these actions to prevent or treat constipation: Drink enough fluid to keep your urine pale yellow. Take over-the-counter or prescription medicines. Eat foods that are high in fiber, such as beans, whole grains, and fresh fruits and vegetables. Limit foods that are high in fat and processed sugars, such as fried or sweet foods. Keep all follow-up visits. This is important. Where to find support If you have been taking opioids for a long time, you may benefit from receiving support for quitting from a local support group or counselor. Ask your health care provider for a referral to these resources in your area. Where to find more information Centers for Disease Control and Prevention (CDC): FootballExhibition.com.br [place] Food and Drug Administration (FDA): PumpkinSearch.com.ee Get help right away if: You may have taken too much of an opioid (overdosed). Common symptoms of an overdose: Your breathing is slower or more shallow than normal. You have a very slow heartbeat (pulse). You have slurred speech. You have nausea and vomiting. Your pupils become very small. You have other potential  symptoms: You are very confused. You faint or feel like you will faint. You have cold, clammy skin. You have blue lips or fingernails. You have thoughts of harming yourself or harming others. These symptoms may represent a serious problem that is an emergency. Do not wait to see if the symptoms will go away. Get medical help right away. Call your local emergency services (911 in the [place] Do not drive yourself to the hospital.  If you ever feel like you may hurt yourself or others, or have thoughts about taking your own life, get help right away. Go to your nearest emergency department or: Call your local emergency services (911 in the  [place] Call the [facility] ((581) 706-6003 in the [place] Call a suicide crisis helpline, such as the National Suicide Prevention Lifeline at 984 428 8988 or 988 in the [place] This is open 24 hours a day in the [place] If you're a Veteran: Call 988 and press 1. This is open 24 hours a day. Text the [employer] at 9865250140. Summary Opioid medicines can help you manage moderate to severe pain for a short period of time. A pain treatment plan is an agreement between you and your health care provider. Discuss the goals of your treatment, including how much pain you might expect to have and how you will manage the pain. If you think that you or someone else may have taken too much of an opioid, get medical help right away. This information is not intended to replace advice given to you by your health care provider. Make sure you discuss any questions you have with your health care provider. Document Revised: 07/28/2023 Document Reviewed: 01/31/2021 Elsevier Patient Education  2024 [employer] Advanced directives: (Copy Requested) Please bring a copy of your health care power of attorney and living will to the office to be added to your chart at your convenience.  Next Medicare Annual Wellness Visit scheduled for next year: Yes

## 2023-12-01 NOTE — Progress Notes (Addendum)
Subjective:   Ricky Meyers is a 78 y.o. male who presents for Medicare Annual/Subsequent preventive examination.  Visit Complete: Virtual I connected with  Ricky Meyers on 12/01/23 by a audio enabled telemedicine application and verified that I am speaking with the correct person using two identifiers.  Patient Location: Home  Provider Location: Home Office  I discussed the limitations of evaluation and management by telemedicine. The patient expressed understanding and agreed to proceed.  Vital Signs: Because this visit was a virtual/telehealth visit, some criteria may be missing or patient reported. Any vitals not documented were not able to be obtained and vitals that have been documented are patient reported.  Patient Medicare AWV questionnaire was completed by the patient on 11/28/23; I have confirmed that all information answered by patient is correct and no changes since this date.  Cardiac Risk Factors include: advanced age (>30men, >21 women);hypertension;male gender     Objective:    Today's Vitals   12/01/23 0932  Weight: 230 lb (104.3 kg)  Height: 6\' 1"  (1.854 m)   Body mass index is 30.34 kg/m.     12/01/2023    9:41 AM 11/18/2022   12:54 PM 05/27/2022    5:51 AM 05/15/2022   11:10 AM 01/24/2022    4:13 PM 01/22/2022    1:58 PM 01/01/2022   11:06 AM  Advanced Directives  Does Patient Have a Medical Advance Directive? Yes Yes Yes Yes Yes Yes Yes  Type of Estate agent of [place];Living will Healthcare Power of [place];Living will Healthcare Power of [place];Living will Living will;Healthcare Power of Attorney Living will Healthcare Power of [place];Living will Healthcare Power of [place];Living will  Does patient want to make changes to medical advance directive?   No - Patient declined      Copy of Healthcare Power of Attorney in Chart? No - copy requested No - copy requested No - copy requested    No - copy requested    Current  Medications (verified) Outpatient Encounter Medications as of 12/01/2023  Medication Sig   allopurinol (ZYLOPRIM) 300 MG tablet TAKE 1 TABLET BY MOUTH EVERY DAY   amoxicillin (AMOXIL) 500 MG capsule TAKE 4 CAPSULES BY MOUTH 1 hour BEFORE dental work   apixaban (ELIQUIS) 2.5 MG TABS tablet Take 1 tablet (2.5 mg total) by mouth 2 (two) times daily.   atorvastatin (LIPITOR) 40 MG tablet Take 1 tablet (40 mg total) by mouth daily.   colchicine (COLCRYS) 0.6 MG tablet Take by mouth daily.   cyclobenzaprine (FLEXERIL) 10 MG tablet TAKE 1 TABLET BY MOUTH 3 TIMES DAILY AS NEEDED FOR MUSCLE SPASMS   diazepam (VALIUM) 10 MG tablet diazepam 10 mg tablet TAKE 1 TABLET BY MOUTH 30 MINUTES PRIOR TO MRI   famotidine (PEPCID) 20 MG tablet Take 1 tablet (20 mg total) by mouth daily after supper.   fluorouracil (EFUDEX) 5 % cream Apply topically 2 (two) times daily.   gabapentin (NEURONTIN) 100 MG capsule Take 1 capsule (100 mg total) by mouth 2 (two) times daily.   Melatonin 12 MG TABS Take 12 mg by mouth at bedtime.   METAMUCIL FIBER PO Take 1 Dose by mouth See admin instructions. Take 1 dose by mouth (scheduled) daily, may take a second dose in the afternoon as needed for constipation   oxyCODONE-acetaminophen (PERCOCET) 10-325 MG tablet Take 1 tablet by mouth every 6 (six) hours as needed for pain.   oxyCODONE-acetaminophen (PERCOCET) 10-325 MG tablet Take 1 tablet by mouth every  6 (six) hours as needed for pain.   oxyCODONE-acetaminophen (PERCOCET) 10-325 MG tablet Take 1 tablet by mouth every 6 (six) hours as needed for pain.   pantoprazole (PROTONIX) 40 MG tablet Take by mouth.   PARoxetine (PAXIL) 20 MG tablet Take 1 tablet (20 mg total) by mouth every morning.   telmisartan-hydrochlorothiazide (MICARDIS HCT) 80-25 MG tablet Take 1 tablet by mouth daily.   No facility-administered encounter medications on file as of 12/01/2023.    Allergies (verified) Patient has no known allergies.   History: Past  Medical History:  Diagnosis Date   Arthritis    neck and back    Chronic neck pain    Depression    ED (erectile dysfunction)    GERD (gastroesophageal reflux disease)    dysphagia   Gout    sees Dr. Alben Deeds    Hyperlipidemia    Hypertension    OSA (obstructive sleep apnea) 12/18/2017   Severe with AHI at 70.1/hr and is now on CPAP at 11cm H2o   Prostate cancer [facility])    history of   Pulmonary embolism (HCC) 06/2019   S/P mitral valve clip implantation 01/24/2022   s/p Mitraclip XTW + NTW with Dr. Excell Seltzer and Dr. Lynnette Caffey   Sleep apnea    snoring/never checked   Past Surgical History:  Procedure Laterality Date   AMPUTATION Left 07/18/2018   Procedure: LEFT RING FINGER REVISION AMPUTATION;  Surgeon: Betha Loa, MD;  Location: [facility] OR;  Service: Orthopedics;  Laterality: Left;   BACK SURGERY     CARDIAC CATHETERIZATION     CERVICAL FUSION  12/02/2007   per Dr. Marikay Alar   COLONOSCOPY  03/11/2017   per Dr. Christella Hartigan, clear, no repeats needed    injection lower back Right 09/17/2016   IR IVC FILTER PLMT / S&I /IMG GUID/MOD SED  11/15/2021   IR IVC FILTER RETRIEVAL / S&I /IMG GUID/MOD SED  12/14/2021   IR RADIOLOGIST EVAL & MGMT  10/30/2021   LUMBAR LAMINECTOMY/DECOMPRESSION MICRODISCECTOMY Left 11/21/2021   Procedure: Laminectomy and Foraminotomy - left - Lumbar three-four;  Surgeon: Tia Alert, MD;  Location: [facility] OR;  Service: Neurosurgery;  Laterality: Left;   PROSTATECTOMY  2000   RIGHT/LEFT HEART CATH AND CORONARY ANGIOGRAPHY N/A 12/20/2021   Procedure: RIGHT/LEFT HEART CATH AND CORONARY ANGIOGRAPHY;  Surgeon: Corky Crafts, MD;  Location: [facility] INVASIVE CV LAB;  Service: Cardiovascular;  Laterality: N/A;   ROTATOR CUFF REPAIR Right 11/2017   TEE WITHOUT CARDIOVERSION N/A 01/01/2022   Procedure: TRANSESOPHAGEAL ECHOCARDIOGRAM (TEE);  Surgeon: Thurmon Fair, MD;  Location: [facility] ENDOSCOPY;  Service: Cardiovascular;  Laterality: N/A;   TEE WITHOUT CARDIOVERSION  N/A 01/24/2022   Procedure: TRANSESOPHAGEAL ECHOCARDIOGRAM (TEE);  Surgeon: Orbie Pyo, MD;  Location: [facility] INVASIVE CV LAB;  Service: Cardiovascular;  Laterality: N/A;   TONSILLECTOMY     as a child   TOTAL HIP ARTHROPLASTY Left 05/27/2022   Procedure: TOTAL HIP ARTHROPLASTY ANTERIOR APPROACH;  Surgeon: Jodi Geralds, MD;  Location: [facility] ORS;  Service: Orthopedics;  Laterality: Left;   TRANSCATHETER MITRAL EDGE TO EDGE REPAIR N/A 01/24/2022   Procedure: MITRAL VALVE REPAIR;  Surgeon: Orbie Pyo, MD;  Location: [facility] INVASIVE CV LAB;  Service: Cardiovascular;  Laterality: N/A;   VASECTOMY     Family History  Problem Relation Age of Onset   Cancer Other        breast, porstate   Hypertension Other    Stroke Other    Breast cancer Mother  Diverticulitis Mother    Heart disease Father    Prostate cancer Father    Stroke Father    Dementia Sister    Colon cancer Neg Hx    Social History   Socioeconomic History   Marital status: Media planner    Spouse name: Not on file   Number of children: 2   Years of education: 14   Highest education level: Some college, no degree  Occupational History   Occupation: retired   Occupation: Retired Medical illustrator  Tobacco Use   Smoking status: Never   Smokeless tobacco: Never  Vaping Use   Vaping status: Never Used  Substance and Sexual Activity   Alcohol use: Yes    Comment: 5 drinks/week   Drug use: Not Currently    Comment: oxycodone is prescribed   Sexual activity: Not on file  Other Topics Concern   Not on file  Social History Narrative   Not on file   Social Drivers of Health   Financial Resource Strain: Low Risk  (12/01/2023)   Overall Financial Resource Strain (CARDIA)    Difficulty of Paying Living Expenses: Not hard at all  Food Insecurity: No Food Insecurity (12/01/2023)   Hunger Vital Sign    Worried About Running Out of Food in the Last Year: Never true    Ran Out of Food in the Last Year: Never true   Transportation Needs: No Transportation Needs (12/01/2023)   PRAPARE - Administrator, Civil Service (Medical): No    Lack of Transportation (Non-Medical): No  Physical Activity: Sufficiently Active (12/01/2023)   Exercise Vital Sign    Days of Exercise per Week: 7 days    Minutes of Exercise per Session: 30 min  Stress: No Stress Concern Present (12/01/2023)   [employer] of Occupational Health - Occupational Stress Questionnaire    Feeling of Stress : Not at all  Social Connections: Socially Integrated (12/01/2023)   Social Connection and Isolation Panel [NHANES]    Frequency of Communication with Friends and Family: More than three times a week    Frequency of Social Gatherings with Friends and Family: More than three times a week    Attends Religious Services: More than 4 times per year    Active Member of [employer] or Organizations: Yes    Attends Engineer, structural: More than 4 times per year    Marital Status: Living with partner    Tobacco Counseling Counseling given: Not Answered   Clinical Intake:  Pre-visit preparation completed: Yes  Pain : No/denies pain     BMI - recorded: 30.34 Nutritional Status: BMI > 30  Obese Nutritional Risks: None Diabetes: No  How often do you need to have someone help you when you read instructions, pamphlets, or other written materials from your doctor or pharmacy?: 1 - Never  Interpreter Needed?: No  Information entered by :: Theresa Mulligan LPN   Activities of Daily Living    12/01/2023    9:39 AM 11/28/2023    8:15 AM  In your present state of health, do you have any difficulty performing the following activities:  Hearing? 1 0  Comment Wears Hearing Aids   Vision? 0 0  Difficulty concentrating or making decisions? 0 0  Walking or climbing stairs? 0 0  Dressing or bathing? 0 0  Doing errands, shopping? 0 0  Preparing Food and eating ? N N  Using the Toilet? N N  In the past six months, have you  accidently leaked urine? N N  Do you have problems with loss of bowel control? N N  Managing your Medications? N N  Managing your Finances? N N  Housekeeping or managing your Housekeeping? N N    Patient Care Team: Nelwyn Salisbury, MD as PCP - General Eldridge Dace Donnie Coffin, MD as PCP - Cardiology (Cardiology) Orbie Pyo, MD as PCP - Structural Heart (Cardiology) Sherrill Raring, [facility] (Pharmacist)  Indicate any recent Medical Services you may have received from other than [facility] providers in the past year (date may be approximate).     Assessment:   This is a routine wellness examination for Keonta.  Hearing/Vision screen Hearing Screening - Comments:: Wears Hearing Aids Vision Screening - Comments:: Wears rx glasses - up to date with routine eye exams with  Dr Vonna Kotyk   Goals Addressed               This Visit's Progress     Increase physical activity (pt-stated)        Lose weight.       Depression Screen    12/01/2023    9:38 AM 11/29/2022    9:29 AM 11/18/2022   12:51 PM 11/13/2022    1:11 PM 09/12/2022   11:20 AM 08/27/2022    8:42 AM 08/26/2022   10:08 AM  PHQ 2/9 Scores  PHQ - 2 Score 0 0 0 0 0 0 0  PHQ- 9 Score   0 0 0  2    Fall Risk    12/01/2023    9:40 AM 11/28/2023    8:15 AM 04/30/2023    2:10 PM 11/29/2022    7:50 AM 11/18/2022   12:54 PM  Fall Risk   Falls in the past year? 0 0 0 0 0  Number falls in past yr: 0   0 0  Injury with Fall? 0   0 0  Risk for fall due to : No Fall Risks   No Fall Risks No Fall Risks  Follow up Falls prevention discussed   Falls evaluation completed Falls prevention discussed    MEDICARE RISK AT HOME: Medicare Risk at Home Any stairs in or around the home?: Yes If so, are there any without handrails?: Yes Home free of loose throw rugs in walkways, pet beds, electrical cords, etc?: No Adequate lighting in your home to reduce risk of falls?: Yes Life alert?: No Use of a cane, walker or w/c?: No Grab bars in the  bathroom?: No Shower chair or bench in shower?: No Elevated toilet seat or a handicapped toilet?: No  TIMED UP AND GO:  Was the test performed?  No    Cognitive Function:        12/01/2023    9:41 AM 11/18/2022   12:55 PM 11/12/2021   10:53 AM 11/08/2020    9:10 AM  6CIT Screen  What Year? 0 points 0 points 0 points 0 points  What month? 0 points 0 points 0 points 0 points  What time? 0 points 0 points 0 points   Count back from 20 0 points 0 points 0 points 0 points  Months in reverse 0 points 0 points 2 points 0 points  Repeat phrase 0 points 0 points 0 points 0 points  Total Score 0 points 0 points 2 points     Immunizations Immunization History  Administered Date(s) Administered   Fluad Quad(high Dose 65+) 08/22/2020, 07/30/2021, 08/26/2022   Influenza Split 09/05/2011, 11/09/2012  Influenza Whole 10/04/2009, 09/24/2010   Influenza, High Dose Seasonal PF 07/17/2015, 09/01/2017, 07/15/2018, 07/28/2019   Influenza,inj,Quad PF,6+ Mos 11/24/2013, 11/16/2014   Influenza-Unspecified 07/28/2019, 07/15/2023   PFIZER(Purple Top)SARS-COV-2 Vaccination 11/19/2019, 12/10/2019, 09/26/2020   Pneumococcal Conjugate-13 07/17/2015   Pneumococcal Polysaccharide-23 10/04/2009   Pneumococcal-Unspecified 09/03/2017   Td 03/10/2009   Tdap 07/18/2018   Zoster Recombinant(Shingrix) 06/05/2021, 08/21/2021   Zoster, Live 09/05/2011    TDAP status: Up to date  Flu Vaccine status: Up to date  Pneumococcal vaccine status: Up to date  Covid-19 vaccine status: Declined, Education has been provided regarding the importance of this vaccine but patient still declined. Advised may receive this vaccine at local pharmacy or Health Dept.or vaccine clinic. Aware to provide a copy of the vaccination record if obtained from local pharmacy or Health Dept. Verbalized acceptance and understanding.  Qualifies for Shingles Vaccine? Yes   Zostavax completed Yes   Shingrix Completed?: Yes  Screening  Tests Health Maintenance  Topic Date Due   Hepatitis C Screening  Never done   COVID-19 Vaccine (4 - 2024-25 season) 07/06/2023   Medicare Annual Wellness (AWV)  11/30/2024   DTaP/Tdap/Td (3 - Td or Tdap) 07/18/2028   Pneumonia Vaccine 11+ Years old  Completed   INFLUENZA VACCINE  Completed   Zoster Vaccines- Shingrix  Completed   HPV VACCINES  Aged Out   Colonoscopy  Discontinued    Health Maintenance  Health Maintenance Due  Topic Date Due   Hepatitis C Screening  Never done   COVID-19 Vaccine (4 - 2024-25 season) 07/06/2023    Additional Screening:  Hepatitis C Screening: does qualify; Deferred  Vision Screening: Recommended annual ophthalmology exams for early detection of glaucoma and other disorders of the eye. Is the patient up to date with their annual eye exam?  Yes  Who is the provider or what is the name of the office in which the patient attends annual eye exams? Dr Vonna Kotyk If pt is not established with a provider, would they like to be referred to a provider to establish care? No .   Dental Screening: Recommended annual dental exams for proper oral hygiene    Community Resource Referral / Chronic Care Management:  CRR required this visit?  No   CCM required this visit?  No     Plan:     I have personally reviewed and noted the following in the patient's chart:   Medical and social history Use of alcohol, tobacco or illicit drugs  Current medications and supplements including opioid prescriptions. Patient is currently taking opioid prescriptions. Information provided to patient regarding non-opioid alternatives. Patient advised to discuss non-opioid treatment plan with their provider. Functional ability and status Nutritional status Physical activity Advanced directives List of other physicians Hospitalizations, surgeries, and ER visits in previous 12 months Vitals Screenings to include cognitive, depression, and falls Referrals and  appointments  In addition, I have reviewed and discussed with patient certain preventive protocols, quality metrics, and best practice recommendations. A written personalized care plan for preventive services as well as general preventive health recommendations were provided to patient.     Tillie Rung, LPN   0/08/2724   After Visit Summary: (MyChart) Due to this being a telephonic visit, the after visit summary with patients personalized plan was offered to patient via MyChart   Nurse Notes: None

## 2023-12-05 NOTE — Telephone Encounter (Signed)
Following up on pt PAP BMS (eliquis) per Marylene [facility] pt has drop off pt application at Dr office application was Submitted to [facility] and is pending at this time.

## 2023-12-12 DIAGNOSIS — H2512 Age-related nuclear cataract, left eye: Secondary | ICD-10-CM | POA: Diagnosis not present

## 2023-12-12 DIAGNOSIS — H2511 Age-related nuclear cataract, right eye: Secondary | ICD-10-CM | POA: Diagnosis not present

## 2023-12-26 DIAGNOSIS — H2511 Age-related nuclear cataract, right eye: Secondary | ICD-10-CM | POA: Diagnosis not present

## 2023-12-30 ENCOUNTER — Other Ambulatory Visit: Payer: Self-pay | Admitting: Family Medicine

## 2024-01-13 ENCOUNTER — Other Ambulatory Visit: Payer: Self-pay | Admitting: Family Medicine

## 2024-02-13 ENCOUNTER — Telehealth (INDEPENDENT_AMBULATORY_CARE_PROVIDER_SITE_OTHER): Admitting: Family Medicine

## 2024-02-13 ENCOUNTER — Encounter: Payer: Self-pay | Admitting: Family Medicine

## 2024-02-13 DIAGNOSIS — M159 Polyosteoarthritis, unspecified: Secondary | ICD-10-CM

## 2024-02-13 DIAGNOSIS — F1190 Opioid use, unspecified, uncomplicated: Secondary | ICD-10-CM

## 2024-02-13 MED ORDER — CYCLOBENZAPRINE HCL 10 MG PO TABS: 10.0000 mg | ORAL_TABLET | Freq: Three times a day (TID) | ORAL | 5 refills | Status: AC | PRN

## 2024-02-13 MED ORDER — OXYCODONE-ACETAMINOPHEN 10-325 MG PO TABS: 1.0000 | ORAL_TABLET | Freq: Four times a day (QID) | ORAL | 0 refills | Status: DC | PRN

## 2024-02-13 NOTE — Progress Notes (Signed)
 Subjective:    Patient ID: Ricky Meyers, male    DOB: January 12, 1946, 78 y.o.   MRN: [id number]  HPI Virtual Visit via Video Note  I connected with the patient on 02/13/24 at  2:30 PM EDT by a video enabled telemedicine application and verified that I am speaking with the correct person using two identifiers.  Location patient: home Location provider:work or home office Persons participating in the virtual visit: patient, provider  I discussed the limitations of evaluation and management by telemedicine and the availability of in person appointments. The patient expressed understanding and agreed to proceed.   HPI: Here for pain management. He is doing well.    ROS: See pertinent positives and negatives per HPI.  Past Medical History:  Diagnosis Date   Arthritis    neck and back    Chronic neck pain    Depression    ED (erectile dysfunction)    GERD (gastroesophageal reflux disease)    dysphagia   Gout    sees Dr. Alben Deeds    Hyperlipidemia    Hypertension    OSA (obstructive sleep apnea) 12/18/2017   Severe with AHI at 70.1/hr and is now on CPAP at 11cm H2o   Prostate cancer [facility])    history of   Pulmonary embolism (HCC) 06/2019   S/P mitral valve clip implantation 01/24/2022   s/p Mitraclip XTW + NTW with Dr. Excell Seltzer and Dr. Lynnette Caffey   Sleep apnea    snoring/never checked    Past Surgical History:  Procedure Laterality Date   AMPUTATION Left 07/18/2018   Procedure: LEFT RING FINGER REVISION AMPUTATION;  Surgeon: Betha Loa, MD;  Location: [facility] OR;  Service: Orthopedics;  Laterality: Left;   BACK SURGERY     CARDIAC CATHETERIZATION     CERVICAL FUSION  12/02/2007   per Dr. Marikay Alar   COLONOSCOPY  03/11/2017   per Dr. Christella Hartigan, clear, no repeats needed    injection lower back Right 09/17/2016   IR IVC FILTER PLMT / S&I /IMG GUID/MOD SED  11/15/2021   IR IVC FILTER RETRIEVAL / S&I /IMG GUID/MOD SED  12/14/2021   IR RADIOLOGIST EVAL & MGMT  10/30/2021    LUMBAR LAMINECTOMY/DECOMPRESSION MICRODISCECTOMY Left 11/21/2021   Procedure: Laminectomy and Foraminotomy - left - Lumbar three-four;  Surgeon: Tia Alert, MD;  Location: [facility] OR;  Service: Neurosurgery;  Laterality: Left;   PROSTATECTOMY  2000   RIGHT/LEFT HEART CATH AND CORONARY ANGIOGRAPHY N/A 12/20/2021   Procedure: RIGHT/LEFT HEART CATH AND CORONARY ANGIOGRAPHY;  Surgeon: Corky Crafts, MD;  Location: [facility] INVASIVE CV LAB;  Service: Cardiovascular;  Laterality: N/A;   ROTATOR CUFF REPAIR Right 11/2017   TEE WITHOUT CARDIOVERSION N/A 01/01/2022   Procedure: TRANSESOPHAGEAL ECHOCARDIOGRAM (TEE);  Surgeon: Thurmon Fair, MD;  Location: [facility] ENDOSCOPY;  Service: Cardiovascular;  Laterality: N/A;   TEE WITHOUT CARDIOVERSION N/A 01/24/2022   Procedure: TRANSESOPHAGEAL ECHOCARDIOGRAM (TEE);  Surgeon: Orbie Pyo, MD;  Location: [facility] INVASIVE CV LAB;  Service: Cardiovascular;  Laterality: N/A;   TONSILLECTOMY     as a child   TOTAL HIP ARTHROPLASTY Left 05/27/2022   Procedure: TOTAL HIP ARTHROPLASTY ANTERIOR APPROACH;  Surgeon: Jodi Geralds, MD;  Location: [facility] ORS;  Service: Orthopedics;  Laterality: Left;   TRANSCATHETER MITRAL EDGE TO EDGE REPAIR N/A 01/24/2022   Procedure: MITRAL VALVE REPAIR;  Surgeon: Orbie Pyo, MD;  Location: [facility] INVASIVE CV LAB;  Service: Cardiovascular;  Laterality: N/A;   VASECTOMY  Family History  Problem Relation Age of Onset   Cancer Other        breast, porstate   Hypertension Other    Stroke Other    Breast cancer Mother    Diverticulitis Mother    Heart disease Father    Prostate cancer Father    Stroke Father    Dementia Sister    Colon cancer Neg Hx      Current Outpatient Medications:    allopurinol (ZYLOPRIM) 300 MG tablet, TAKE 1 TABLET BY MOUTH EVERY DAY, Disp: 90 tablet, Rfl: 1   apixaban (ELIQUIS) 2.5 MG TABS tablet, Take 1 tablet (2.5 mg total) by mouth 2 (two) times daily., Disp: 30 tablet, Rfl: 0   atorvastatin  (LIPITOR) 40 MG tablet, TAKE 1 TABLET BY MOUTH EVERY DAY, Disp: 90 tablet, Rfl: 1   colchicine (COLCRYS) 0.6 MG tablet, Take by mouth daily., Disp: , Rfl:    cyclobenzaprine (FLEXERIL) 10 MG tablet, TAKE 1 TABLET BY MOUTH 3 TIMES DAILY AS NEEDED FOR MUSCLE SPASMS, Disp: 90 tablet, Rfl: 3   diazepam (VALIUM) 10 MG tablet, diazepam 10 mg tablet TAKE 1 TABLET BY MOUTH 30 MINUTES PRIOR TO MRI, Disp: , Rfl:    famotidine (PEPCID) 20 MG tablet, Take 1 tablet (20 mg total) by mouth daily after supper., Disp: 90 tablet, Rfl: 3   fluorouracil (EFUDEX) 5 % cream, Apply topically 2 (two) times daily., Disp: , Rfl:    gabapentin (NEURONTIN) 100 MG capsule, Take 1 capsule (100 mg total) by mouth 2 (two) times daily., Disp: 360 capsule, Rfl: 3   Melatonin 12 MG TABS, Take 12 mg by mouth at bedtime., Disp: , Rfl:    METAMUCIL FIBER PO, Take 1 Dose by mouth See admin instructions. Take 1 dose by mouth (scheduled) daily, may take a second dose in the afternoon as needed for constipation, Disp: , Rfl:    oxyCODONE-acetaminophen (PERCOCET) 10-325 MG tablet, Take 1 tablet by mouth every 6 (six) hours as needed for pain., Disp: 120 tablet, Rfl: 0   oxyCODONE-acetaminophen (PERCOCET) 10-325 MG tablet, Take 1 tablet by mouth every 6 (six) hours as needed for pain., Disp: 120 tablet, Rfl: 0   oxyCODONE-acetaminophen (PERCOCET) 10-325 MG tablet, Take 1 tablet by mouth every 6 (six) hours as needed for pain., Disp: 120 tablet, Rfl: 0   pantoprazole (PROTONIX) 40 MG tablet, Take by mouth., Disp: , Rfl:    PARoxetine (PAXIL) 20 MG tablet, TAKE 1 TABLET BY MOUTH EVERY MORNING, Disp: 90 tablet, Rfl: 1   telmisartan-hydrochlorothiazide (MICARDIS HCT) 80-25 MG tablet, Take 1 tablet by mouth daily., Disp: 90 tablet, Rfl: 3   amoxicillin (AMOXIL) 500 MG capsule, TAKE 4 CAPSULES BY MOUTH 1 hour BEFORE dental work (Patient not taking: Reported on 02/13/2024), Disp: 4 capsule, Rfl: 3  EXAM:  VITALS per patient if  applicable:  GENERAL: alert, oriented, appears well and in no acute distress  HEENT: atraumatic, conjunttiva clear, no obvious abnormalities on inspection of external nose and ears  NECK: normal movements of the head and neck  LUNGS: on inspection no signs of respiratory distress, breathing rate appears normal, no obvious gross SOB, gasping or wheezing  CV: no obvious cyanosis  MS: moves all visible extremities without noticeable abnormality  PSYCH/NEURO: pleasant and cooperative, no obvious depression or anxiety, speech and thought processing grossly intact  ASSESSMENT AND PLAN: Pain management.  Indication for chronic opioid: OA Medication and dose: Percocet 10-325 # pills per month: 120 Last UDS date: 10-31-23  Opioid Treatment Agreement signed (Y/N): 01-15-19 Opioid Treatment Agreement last reviewed with patient:  02-13-24 [facility] reviewed this encounter (include red flags): Yes Meds were refilled.  Gershon Crane, MD  Discussed the following assessment and plan:  No diagnosis found.     I discussed the assessment and treatment plan with the patient. The patient was provided an opportunity to ask questions and all were answered. The patient agreed with the plan and demonstrated an understanding of the instructions.   The patient was advised to call back or seek an in-person evaluation if the symptoms worsen or if the condition fails to improve as anticipated.      Review of Systems     Objective:   Physical Exam        Assessment & Plan:

## 2024-02-23 DIAGNOSIS — Z09 Encounter for follow-up examination after completed treatment for conditions other than malignant neoplasm: Secondary | ICD-10-CM | POA: Diagnosis not present

## 2024-02-23 DIAGNOSIS — L814 Other melanin hyperpigmentation: Secondary | ICD-10-CM | POA: Diagnosis not present

## 2024-02-23 DIAGNOSIS — L578 Other skin changes due to chronic exposure to nonionizing radiation: Secondary | ICD-10-CM | POA: Diagnosis not present

## 2024-02-23 DIAGNOSIS — L821 Other seborrheic keratosis: Secondary | ICD-10-CM | POA: Diagnosis not present

## 2024-02-23 DIAGNOSIS — L570 Actinic keratosis: Secondary | ICD-10-CM | POA: Diagnosis not present

## 2024-02-23 DIAGNOSIS — D1801 Hemangioma of skin and subcutaneous tissue: Secondary | ICD-10-CM | POA: Diagnosis not present

## 2024-02-23 DIAGNOSIS — L300 Nummular dermatitis: Secondary | ICD-10-CM | POA: Diagnosis not present

## 2024-04-13 ENCOUNTER — Other Ambulatory Visit: Payer: Self-pay | Admitting: Family Medicine

## 2024-05-10 ENCOUNTER — Telehealth (INDEPENDENT_AMBULATORY_CARE_PROVIDER_SITE_OTHER): Admitting: Family Medicine

## 2024-05-10 DIAGNOSIS — F1190 Opioid use, unspecified, uncomplicated: Secondary | ICD-10-CM | POA: Diagnosis not present

## 2024-05-10 DIAGNOSIS — M159 Polyosteoarthritis, unspecified: Secondary | ICD-10-CM | POA: Diagnosis not present

## 2024-05-10 MED ORDER — OXYCODONE-ACETAMINOPHEN 10-325 MG PO TABS: 1.0000 | ORAL_TABLET | Freq: Four times a day (QID) | ORAL | 0 refills | Status: DC | PRN

## 2024-05-10 MED ORDER — GABAPENTIN 100 MG PO CAPS: 100.0000 mg | ORAL_CAPSULE | Freq: Two times a day (BID) | ORAL | 5 refills | Status: DC

## 2024-05-10 NOTE — Progress Notes (Signed)
 Subjective:    Patient ID: Ricky Meyers, male    DOB: 17-Feb-1946, 78 y.o.   MRN: [id number]  HPI Virtual Visit via Video Note  I connected with the patient on 05/10/24 at  2:45 PM EDT by a video enabled telemedicine application and verified that I am speaking with the correct person using two identifiers.  Location patient: home Location provider:work or home office Persons participating in the virtual visit: patient, provider  I discussed the limitations of evaluation and management by telemedicine and the availability of in person appointments. The patient expressed understanding and agreed to proceed.   HPI: Here for pain management. His joint pains have been about the same, but he asks to get back on Gabapentin .   ROS: See pertinent positives and negatives per HPI.  Past Medical History:  Diagnosis Date   Arthritis    neck and back    Chronic neck pain    Depression    ED (erectile dysfunction)    GERD (gastroesophageal reflux disease)    dysphagia   Gout    sees Dr. [name]    Hyperlipidemia    Hypertension    OSA (obstructive sleep apnea) 12/18/2017   Severe with AHI at 70.1/hr and is now on CPAP at 11cm H2o   Prostate cancer [facility])    history of   Pulmonary embolism (HCC) 06/2019   S/P mitral valve clip implantation 01/24/2022   s/p Mitraclip XTW + NTW with Dr. Wonda and Dr. Wendel   Sleep apnea    snoring/never checked    Past Surgical History:  Procedure Laterality Date   AMPUTATION Left 07/18/2018   Procedure: LEFT RING FINGER REVISION AMPUTATION;  Surgeon: Murrell Drivers, MD;  Location: [facility] OR;  Service: Orthopedics;  Laterality: Left;   BACK SURGERY     CARDIAC CATHETERIZATION     CERVICAL FUSION  12/02/2007   per Dr. Alm Molt   COLONOSCOPY  03/11/2017   per Dr. Teressa, clear, no repeats needed    injection lower back Right 09/17/2016   IR IVC FILTER PLMT / S&I /IMG GUID/MOD SED  11/15/2021   IR IVC FILTER RETRIEVAL / S&I /IMG GUID/MOD  SED  12/14/2021   IR RADIOLOGIST EVAL & MGMT  10/30/2021   LUMBAR LAMINECTOMY/DECOMPRESSION MICRODISCECTOMY Left 11/21/2021   Procedure: Laminectomy and Foraminotomy - left - Lumbar three-four;  Surgeon: Molt Alm RAMAN, MD;  Location: [facility] OR;  Service: Neurosurgery;  Laterality: Left;   PROSTATECTOMY  2000   RIGHT/LEFT HEART CATH AND CORONARY ANGIOGRAPHY N/A 12/20/2021   Procedure: RIGHT/LEFT HEART CATH AND CORONARY ANGIOGRAPHY;  Surgeon: [name], MD;  Location: [facility] INVASIVE CV LAB;  Service: Cardiovascular;  Laterality: N/A;   ROTATOR CUFF REPAIR Right 11/2017   TEE WITHOUT CARDIOVERSION N/A 01/01/2022   Procedure: TRANSESOPHAGEAL ECHOCARDIOGRAM (TEE);  Surgeon: [name], MD;  Location: [facility] ENDOSCOPY;  Service: Cardiovascular;  Laterality: N/A;   TEE WITHOUT CARDIOVERSION N/A 01/24/2022   Procedure: TRANSESOPHAGEAL ECHOCARDIOGRAM (TEE);  Surgeon: Thukkani, Arun K, MD;  Location: [facility] INVASIVE CV LAB;  Service: Cardiovascular;  Laterality: N/A;   TONSILLECTOMY     as a child   TOTAL HIP ARTHROPLASTY Left 05/27/2022   Procedure: TOTAL HIP ARTHROPLASTY ANTERIOR APPROACH;  Surgeon: [name], MD;  Location: [facility] ORS;  Service: Orthopedics;  Laterality: Left;   TRANSCATHETER MITRAL EDGE TO EDGE REPAIR N/A 01/24/2022   Procedure: MITRAL VALVE REPAIR;  Surgeon: [name], MD;  Location: [facility] INVASIVE CV LAB;  Service: Cardiovascular;  Laterality: N/A;   VASECTOMY      Family History  Problem Relation Age of Onset   Cancer Other        breast, porstate   Hypertension Other    Stroke Other    Breast cancer Mother    Diverticulitis Mother    Heart disease Father    Prostate cancer Father    Stroke Father    Dementia Sister    Colon cancer Neg Hx      Current Outpatient Medications:    allopurinol  (ZYLOPRIM ) 300 MG tablet, TAKE 1 TABLET BY MOUTH EVERY DAY, Disp: 90 tablet, Rfl: 1   apixaban  (ELIQUIS ) 2.5 MG TABS tablet, Take 1 tablet (2.5 mg total) by mouth 2 (two)  times daily., Disp: 30 tablet, Rfl: 0   atorvastatin  (LIPITOR) 40 MG tablet, TAKE 1 TABLET BY MOUTH EVERY DAY, Disp: 90 tablet, Rfl: 1   colchicine  (COLCRYS ) 0.6 MG tablet, Take by mouth daily., Disp: , Rfl:    cyclobenzaprine  (FLEXERIL ) 10 MG tablet, Take 1 tablet (10 mg total) by mouth 3 (three) times daily as needed for muscle spasms., Disp: 90 tablet, Rfl: 5   diazepam  (VALIUM ) 10 MG tablet, diazepam  10 mg tablet TAKE 1 TABLET BY MOUTH 30 MINUTES PRIOR TO MRI, Disp: , Rfl:    famotidine  (PEPCID ) 20 MG tablet, Take 1 tablet (20 mg total) by mouth daily after supper., Disp: 90 tablet, Rfl: 3   fluorouracil (EFUDEX) 5 % cream, Apply topically 2 (two) times daily., Disp: , Rfl:    gabapentin  (NEURONTIN ) 100 MG capsule, Take 1 capsule (100 mg total) by mouth 2 (two) times daily., Disp: 360 capsule, Rfl: 3   Melatonin 12 MG TABS, Take 12 mg by mouth at bedtime., Disp: , Rfl:    METAMUCIL FIBER PO, Take 1 Dose by mouth See admin instructions. Take 1 dose by mouth (scheduled) daily, may take a second dose in the afternoon as needed for constipation, Disp: , Rfl:    pantoprazole  (PROTONIX ) 40 MG tablet, Take by mouth., Disp: , Rfl:    PARoxetine  (PAXIL ) 20 MG tablet, TAKE 1 TABLET BY MOUTH EVERY MORNING, Disp: 90 tablet, Rfl: 1   telmisartan -hydrochlorothiazide  (MICARDIS  HCT) 80-25 MG tablet, Take 1 tablet by mouth daily., Disp: 90 tablet, Rfl: 3   oxyCODONE -acetaminophen  (PERCOCET) 10-325 MG tablet, Take 1 tablet by mouth every 6 (six) hours as needed for pain., Disp: 120 tablet, Rfl: 0   oxyCODONE -acetaminophen  (PERCOCET) 10-325 MG tablet, Take 1 tablet by mouth every 6 (six) hours as needed for pain., Disp: 120 tablet, Rfl: 0   oxyCODONE -acetaminophen  (PERCOCET) 10-325 MG tablet, Take 1 tablet by mouth every 6 (six) hours as needed for pain., Disp: 120 tablet, Rfl: 0  EXAM:  VITALS per patient if applicable:  GENERAL: alert, oriented, appears well and in no acute distress  HEENT: atraumatic,  conjunttiva clear, no obvious abnormalities on inspection of external nose and ears  NECK: normal movements of the head and neck  LUNGS: on inspection no signs of respiratory distress, breathing rate appears normal, no obvious gross SOB, gasping or wheezing  CV: no obvious cyanosis  MS: moves all visible extremities without noticeable abnormality  PSYCH/NEURO: pleasant and cooperative, no obvious depression or anxiety, speech and thought processing grossly intact  ASSESSMENT AND PLAN: Pain management. Indication for chronic opioid: OA Medication and dose: Percocet 10-325 # pills per month: 120 Last UDS date: 10-31-23 Opioid Treatment Agreement signed (Y/N): 01-15-19 Opioid Treatment Agreement last reviewed with patient:  05-10-24  NCCSRS reviewed this encounter (include red flags): Yes We refilled the Percocet as well as the Gabapentin .  Garnette Olmsted, MD  Discussed the following assessment and plan:  Chronic narcotic use  Osteoarthritis of multiple joints, unspecified osteoarthritis type     I discussed the assessment and treatment plan with the patient. The patient was provided an opportunity to ask questions and all were answered. The patient agreed with the plan and demonstrated an understanding of the instructions.   The patient was advised to call back or seek an in-person evaluation if the symptoms worsen or if the condition fails to improve as anticipated.      Review of Systems     Objective:   Physical Exam        Assessment & Plan:

## 2024-05-25 ENCOUNTER — Encounter: Payer: Self-pay | Admitting: Family Medicine

## 2024-05-25 DIAGNOSIS — G8929 Other chronic pain: Secondary | ICD-10-CM

## 2024-05-26 NOTE — Telephone Encounter (Signed)
 I did the referral to Dr. Yvone

## 2024-06-14 ENCOUNTER — Other Ambulatory Visit: Payer: Self-pay | Admitting: Family Medicine

## 2024-06-14 DIAGNOSIS — I10 Essential (primary) hypertension: Secondary | ICD-10-CM

## 2024-06-17 DIAGNOSIS — M1711 Unilateral primary osteoarthritis, right knee: Secondary | ICD-10-CM | POA: Diagnosis not present

## 2024-06-29 ENCOUNTER — Other Ambulatory Visit: Payer: Self-pay | Admitting: Family Medicine

## 2024-07-19 DIAGNOSIS — M1711 Unilateral primary osteoarthritis, right knee: Secondary | ICD-10-CM | POA: Diagnosis not present

## 2024-08-06 ENCOUNTER — Telehealth (INDEPENDENT_AMBULATORY_CARE_PROVIDER_SITE_OTHER): Admitting: Family Medicine

## 2024-08-06 ENCOUNTER — Encounter: Payer: Self-pay | Admitting: Family Medicine

## 2024-08-06 DIAGNOSIS — G8929 Other chronic pain: Secondary | ICD-10-CM | POA: Diagnosis not present

## 2024-08-06 DIAGNOSIS — M159 Polyosteoarthritis, unspecified: Secondary | ICD-10-CM | POA: Diagnosis not present

## 2024-08-06 MED ORDER — OXYCODONE-ACETAMINOPHEN 10-325 MG PO TABS: 1.0000 | ORAL_TABLET | Freq: Four times a day (QID) | ORAL | 0 refills | Status: DC | PRN

## 2024-08-06 NOTE — Progress Notes (Signed)
 Subjective:    Patient ID: Ricky Meyers, male    DOB: 09-06-1946, 78 y.o.   MRN: [id number]  HPI Virtual Visit via Video Note  I connected with the patient on 08/06/24 at  9:30 AM EDT by a video enabled telemedicine application and verified that I am speaking with the correct person using two identifiers.  Location patient: home Location provider:work or home office Persons participating in the virtual visit: patient, provider  I discussed the limitations of evaluation and management by telemedicine and the availability of in person appointments. The patient expressed understanding and agreed to proceed.   HPI: Here for pain management. At our last visit we added Gabapentin  to the Percocet. He says this has been helpful.    ROS: See pertinent positives and negatives per HPI.  Past Medical History:  Diagnosis Date   Arthritis    neck and back    Chronic neck pain    Depression    ED (erectile dysfunction)    GERD (gastroesophageal reflux disease)    dysphagia   Gout    sees Dr. [name]    Hyperlipidemia    Hypertension    OSA (obstructive sleep apnea) 12/18/2017   Severe with AHI at 70.1/hr and is now on CPAP at 11cm H2o   Prostate cancer [facility])    history of   Pulmonary embolism (HCC) 06/2019   S/P mitral valve clip implantation 01/24/2022   s/p Mitraclip XTW + NTW with Dr. Wonda and Dr. Wendel   Sleep apnea    snoring/never checked    Past Surgical History:  Procedure Laterality Date   AMPUTATION Left 07/18/2018   Procedure: LEFT RING FINGER REVISION AMPUTATION;  Surgeon: Murrell Drivers, MD;  Location: [facility] OR;  Service: Orthopedics;  Laterality: Left;   BACK SURGERY     CARDIAC CATHETERIZATION     CERVICAL FUSION  12/02/2007   per Dr. Alm Molt   COLONOSCOPY  03/11/2017   per Dr. Teressa, clear, no repeats needed    injection lower back Right 09/17/2016   IR IVC FILTER PLMT / S&I /IMG GUID/MOD SED  11/15/2021   IR IVC FILTER RETRIEVAL / S&I /IMG  GUID/MOD SED  12/14/2021   IR RADIOLOGIST EVAL & MGMT  10/30/2021   LUMBAR LAMINECTOMY/DECOMPRESSION MICRODISCECTOMY Left 11/21/2021   Procedure: Laminectomy and Foraminotomy - left - Lumbar three-four;  Surgeon: Molt Alm RAMAN, MD;  Location: [facility] OR;  Service: Neurosurgery;  Laterality: Left;   PROSTATECTOMY  2000   RIGHT/LEFT HEART CATH AND CORONARY ANGIOGRAPHY N/A 12/20/2021   Procedure: RIGHT/LEFT HEART CATH AND CORONARY ANGIOGRAPHY;  Surgeon: [name], MD;  Location: [facility] INVASIVE CV LAB;  Service: Cardiovascular;  Laterality: N/A;   ROTATOR CUFF REPAIR Right 11/2017   TEE WITHOUT CARDIOVERSION N/A 01/01/2022   Procedure: TRANSESOPHAGEAL ECHOCARDIOGRAM (TEE);  Surgeon: [name], MD;  Location: [facility] ENDOSCOPY;  Service: Cardiovascular;  Laterality: N/A;   TEE WITHOUT CARDIOVERSION N/A 01/24/2022   Procedure: TRANSESOPHAGEAL ECHOCARDIOGRAM (TEE);  Surgeon: Thukkani, Arun K, MD;  Location: [facility] INVASIVE CV LAB;  Service: Cardiovascular;  Laterality: N/A;   TONSILLECTOMY     as a child   TOTAL HIP ARTHROPLASTY Left 05/27/2022   Procedure: TOTAL HIP ARTHROPLASTY ANTERIOR APPROACH;  Surgeon: [name], MD;  Location: [facility] ORS;  Service: Orthopedics;  Laterality: Left;   TRANSCATHETER MITRAL EDGE TO EDGE REPAIR N/A 01/24/2022   Procedure: MITRAL VALVE REPAIR;  Surgeon: [name], MD;  Location: [facility] INVASIVE CV LAB;  Service:  Cardiovascular;  Laterality: N/A;   VASECTOMY      Family History  Problem Relation Age of Onset   Cancer Other        breast, porstate   Hypertension Other    Stroke Other    Breast cancer Mother    Diverticulitis Mother    Heart disease Father    Prostate cancer Father    Stroke Father    Dementia Sister    Colon cancer Neg Hx      Current Outpatient Medications:    allopurinol  (ZYLOPRIM ) 300 MG tablet, TAKE 1 TABLET BY MOUTH EVERY DAY, Disp: 90 tablet, Rfl: 1   apixaban  (ELIQUIS ) 2.5 MG TABS tablet, Take 1 tablet (2.5 mg total) by mouth  2 (two) times daily., Disp: 30 tablet, Rfl: 0   atorvastatin  (LIPITOR) 40 MG tablet, TAKE 1 TABLET BY MOUTH EVERY DAY, Disp: 90 tablet, Rfl: 3   colchicine  (COLCRYS ) 0.6 MG tablet, Take by mouth daily., Disp: , Rfl:    cyclobenzaprine  (FLEXERIL ) 10 MG tablet, Take 1 tablet (10 mg total) by mouth 3 (three) times daily as needed for muscle spasms., Disp: 90 tablet, Rfl: 5   diazepam  (VALIUM ) 10 MG tablet, diazepam  10 mg tablet TAKE 1 TABLET BY MOUTH 30 MINUTES PRIOR TO MRI, Disp: , Rfl:    famotidine  (PEPCID ) 20 MG tablet, Take 1 tablet (20 mg total) by mouth daily after supper., Disp: 90 tablet, Rfl: 3   fluorouracil (EFUDEX) 5 % cream, Apply topically 2 (two) times daily., Disp: , Rfl:    gabapentin  (NEURONTIN ) 100 MG capsule, Take 1 capsule (100 mg total) by mouth 2 (two) times daily., Disp: 60 capsule, Rfl: 5   Melatonin 12 MG TABS, Take 12 mg by mouth at bedtime., Disp: , Rfl:    METAMUCIL FIBER PO, Take 1 Dose by mouth See admin instructions. Take 1 dose by mouth (scheduled) daily, may take a second dose in the afternoon as needed for constipation, Disp: , Rfl:    oxyCODONE -acetaminophen  (PERCOCET) 10-325 MG tablet, Take 1 tablet by mouth every 6 (six) hours as needed for pain., Disp: 120 tablet, Rfl: 0   oxyCODONE -acetaminophen  (PERCOCET) 10-325 MG tablet, Take 1 tablet by mouth every 6 (six) hours as needed for pain., Disp: 120 tablet, Rfl: 0   oxyCODONE -acetaminophen  (PERCOCET) 10-325 MG tablet, Take 1 tablet by mouth every 6 (six) hours as needed for pain., Disp: 120 tablet, Rfl: 0   pantoprazole  (PROTONIX ) 40 MG tablet, Take by mouth., Disp: , Rfl:    PARoxetine  (PAXIL ) 20 MG tablet, TAKE 1 TABLET BY MOUTH EVERY MORNING, Disp: 90 tablet, Rfl: 1   telmisartan -hydrochlorothiazide  (MICARDIS  HCT) 80-25 MG tablet, TAKE 1 TABLET BY MOUTH EVERY DAY, Disp: 90 tablet, Rfl: 3  EXAM:  VITALS per patient if applicable:  GENERAL: alert, oriented, appears well and in no acute distress  HEENT:  atraumatic, conjunttiva clear, no obvious abnormalities on inspection of external nose and ears  NECK: normal movements of the head and neck  LUNGS: on inspection no signs of respiratory distress, breathing rate appears normal, no obvious gross SOB, gasping or wheezing  CV: no obvious cyanosis  MS: moves all visible extremities without noticeable abnormality  PSYCH/NEURO: pleasant and cooperative, no obvious depression or anxiety, speech and thought processing grossly intact  ASSESSMENT AND PLAN: Pain management.  Indication for chronic opioid: OA Medication and dose: Percpcet 10-325 # pills per month: 120 Last UDS date: 10-31-23 Opioid Treatment Agreement signed (Y/N): 01-15-19 Opioid Treatment Agreement last reviewed  with patient:  08-06-24 NCCSRS reviewed this encounter (include red flags): Yes Meds were refilled.  Garnette Olmsted, MD  Discussed the following assessment and plan:  No diagnosis found.     I discussed the assessment and treatment plan with the patient. The patient was provided an opportunity to ask questions and all were answered. The patient agreed with the plan and demonstrated an understanding of the instructions.   The patient was advised to call back or seek an in-person evaluation if the symptoms worsen or if the condition fails to improve as anticipated.      Review of Systems     Objective:   Physical Exam        Assessment & Plan:

## 2024-08-20 DIAGNOSIS — H43393 Other vitreous opacities, bilateral: Secondary | ICD-10-CM | POA: Diagnosis not present

## 2024-08-24 DIAGNOSIS — R229 Localized swelling, mass and lump, unspecified: Secondary | ICD-10-CM | POA: Diagnosis not present

## 2024-08-24 DIAGNOSIS — D1801 Hemangioma of skin and subcutaneous tissue: Secondary | ICD-10-CM | POA: Diagnosis not present

## 2024-08-24 DIAGNOSIS — L2989 Other pruritus: Secondary | ICD-10-CM | POA: Diagnosis not present

## 2024-08-24 DIAGNOSIS — Z08 Encounter for follow-up examination after completed treatment for malignant neoplasm: Secondary | ICD-10-CM | POA: Diagnosis not present

## 2024-08-24 DIAGNOSIS — L570 Actinic keratosis: Secondary | ICD-10-CM | POA: Diagnosis not present

## 2024-08-24 DIAGNOSIS — L821 Other seborrheic keratosis: Secondary | ICD-10-CM | POA: Diagnosis not present

## 2024-08-24 DIAGNOSIS — C44629 Squamous cell carcinoma of skin of left upper limb, including shoulder: Secondary | ICD-10-CM | POA: Diagnosis not present

## 2024-08-24 DIAGNOSIS — L814 Other melanin hyperpigmentation: Secondary | ICD-10-CM | POA: Diagnosis not present

## 2024-08-24 DIAGNOSIS — L820 Inflamed seborrheic keratosis: Secondary | ICD-10-CM | POA: Diagnosis not present

## 2024-08-24 DIAGNOSIS — L538 Other specified erythematous conditions: Secondary | ICD-10-CM | POA: Diagnosis not present

## 2024-08-24 DIAGNOSIS — Z85828 Personal history of other malignant neoplasm of skin: Secondary | ICD-10-CM | POA: Diagnosis not present

## 2024-08-24 DIAGNOSIS — Z789 Other specified health status: Secondary | ICD-10-CM | POA: Diagnosis not present

## 2024-08-31 DIAGNOSIS — M1711 Unilateral primary osteoarthritis, right knee: Secondary | ICD-10-CM | POA: Diagnosis not present

## 2024-09-01 ENCOUNTER — Telehealth (HOSPITAL_BASED_OUTPATIENT_CLINIC_OR_DEPARTMENT_OTHER): Payer: Self-pay

## 2024-09-01 ENCOUNTER — Telehealth (HOSPITAL_BASED_OUTPATIENT_CLINIC_OR_DEPARTMENT_OTHER): Payer: Self-pay | Admitting: *Deleted

## 2024-09-01 ENCOUNTER — Encounter: Payer: Self-pay | Admitting: Family Medicine

## 2024-09-01 NOTE — Telephone Encounter (Signed)
   Name: Ricky Meyers  DOB: 08-14-1946  MRN: [id number]  Primary Cardiologist: Candyce Reek, MD   Preoperative team, please contact this patient and set up a phone call appointment for further preoperative risk assessment. Please obtain consent and complete medication review. Thank you for your help.  I confirm that guidance regarding antiplatelet and oral anticoagulation therapy has been completed and, if necessary, noted below.  Eliquis  is managed by Dr. Liam for hx of DVT/PE. Surgeon will need to contact that practice for recommendations.   I also confirmed the patient resides in the state of [place] . As per [facility] [employer] telemedicine laws, the patient must reside in the state in which the provider is licensed.   Lamarr Satterfield, NP 09/01/2024, 10:37 AM [facility]

## 2024-09-01 NOTE — Telephone Encounter (Signed)
Left message to call back and schedule a tele pre op appt.  

## 2024-09-01 NOTE — Telephone Encounter (Signed)
 DPR ok to s/w spouse Nancy. Preop tele appt 09/15/24. Ricky Meyers states surgery trying to be planned for between Thanksgiving and Christmas.   Med rec and consent are done.    Patient Consent for Virtual Visit        Ricky Meyers has provided verbal consent on 09/01/2024 for a virtual visit (video or telephone).   CONSENT FOR VIRTUAL VISIT FOR:  Ricky Meyers  By participating in this virtual visit I agree to the following:  I hereby voluntarily request, consent and authorize [facility] and its employed or contracted physicians, physician assistants, nurse practitioners or other licensed health care professionals (the Practitioner), to provide me with telemedicine health care services (the "Services) as deemed necessary by the treating Practitioner. I acknowledge and consent to receive the Services by the Practitioner via telemedicine. I understand that the telemedicine visit will involve communicating with the Practitioner through live audiovisual communication technology and the disclosure of certain medical information by electronic transmission. I acknowledge that I have been given the opportunity to request an in-person assessment or other available alternative prior to the telemedicine visit and am voluntarily participating in the telemedicine visit.  I understand that I have the right to withhold or withdraw my consent to the use of telemedicine in the course of my care at any time, without affecting my right to future care or treatment, and that the Practitioner or I may terminate the telemedicine visit at any time. I understand that I have the right to inspect all information obtained and/or recorded in the course of the telemedicine visit and may receive copies of available information for a reasonable fee.  I understand that some of the potential risks of receiving the Services via telemedicine include:  Delay or interruption in medical evaluation due to technological  equipment failure or disruption; Information transmitted may not be sufficient (e.g. poor resolution of images) to allow for appropriate medical decision making by the Practitioner; and/or  In rare instances, security protocols could fail, causing a breach of personal health information.  Furthermore, I acknowledge that it is my responsibility to provide information about my medical history, conditions and care that is complete and accurate to the best of my ability. I acknowledge that Practitioner's advice, recommendations, and/or decision may be based on factors not within their control, such as incomplete or inaccurate data provided by me or distortions of diagnostic images or specimens that may result from electronic transmissions. I understand that the practice of medicine is not an exact science and that Practitioner makes no warranties or guarantees regarding treatment outcomes. I acknowledge that a copy of this consent can be made available to me via my patient portal [facility] MyChart), or I can request a printed copy by calling the office of [facility].    I understand that my insurance will be billed for this visit.   I have read or had this consent read to me. I understand the contents of this consent, which adequately explains the benefits and risks of the Services being provided via telemedicine.  I have been provided ample opportunity to ask questions regarding this consent and the Services and have had my questions answered to my satisfaction. I give my informed consent for the services to be provided through the use of telemedicine in my medical care

## 2024-09-01 NOTE — Telephone Encounter (Signed)
   Pre-operative Risk Assessment    Patient Name: Ricky Meyers  DOB: 11/15/1945 MRN: [id number]   Date of last office visit: 10/07/2023  - [name] Date of next office visit: N/A   Request for Surgical Clearance    Procedure:  Right total hip arthroplasty (needs notes and clearance)  Date of Surgery:  Clearance TBD                                 Surgeon:  Dr. Marvin Surgeon's Group or Practice Name:  [facility] Phone number:  [phone] Fax number:  [phone]   Type of Clearance Requested:   - Medical  - Pharmacy:  Hold Apixaban  (Eliquis ) - Not specified   Type of Anesthesia:  Spinal   Additional requests/questions:  N/A  SignedPatrcia Iverson CROME   09/01/2024, 10:21 AM

## 2024-09-01 NOTE — Telephone Encounter (Signed)
 DPR ok to s/w spouse Nancy. Preop tele appt 09/15/24. Ricky Meyers states surgery trying to be planned for between Thanksgiving and Christmas.   Med rec and consent are done.

## 2024-09-02 NOTE — Telephone Encounter (Signed)
 FYI Pt Surgical received and placed in Dr Johnny red folder

## 2024-09-03 NOTE — Telephone Encounter (Signed)
 The form is ready

## 2024-09-15 ENCOUNTER — Ambulatory Visit: Attending: Student in an Organized Health Care Education/Training Program | Admitting: Emergency Medicine

## 2024-09-15 DIAGNOSIS — Z01810 Encounter for preprocedural cardiovascular examination: Secondary | ICD-10-CM | POA: Insufficient documentation

## 2024-09-15 NOTE — Progress Notes (Signed)
 Virtual Visit via Telephone Note   Because of [name] co-morbid illnesses, he is at least at moderate risk for complications without adequate follow up.  This format is felt to be most appropriate for this patient at this time.  Due to technical limitations with video connection (technology), today's appointment will be conducted as an audio only telehealth visit, and [name] verbally agreed to proceed in this manner.   All issues noted in this document were discussed and addressed.  No physical exam could be performed with this format.  Evaluation Performed:  Preoperative cardiovascular risk assessment _____________   Date:  09/15/2024   Patient ID:  Ricky Meyers, Ricky Meyers 12-17-1945, MRN [id number] Patient Location:  Home Provider location:   Office  Primary Care Provider:  [name] LABOR, MD Primary Cardiologist:  Candyce Reek, MD  Chief Complaint / Patient Profile   78 y.o. y/o male with a h/o severe mitral regurgitation s/p Mitral Clip, nonobstructive CAD, HTN, HLD, DVT/PE who is pending - our request states a hip surgery, however, the patient tells me he's having knee surgery - and presents today for telephonic preoperative cardiovascular risk assessment.  History of Present Illness    Ricky Meyers is a 78 y.o. male who presents via audio/video conferencing for a telehealth visit today.  Pt was last seen in cardiology clinic on 10/07/2023 by [name].  At that time [name] was doing well.  The patient is now pending procedure as outlined above. Since his last visit, he describes twinges of occasional atypical chest pain, occur spontaneously, lasting a few seconds, nonexertional, no associated symptoms.   I am unsure if I should even tell you about this, because it is so insignificant, but I feel like I have to since you asked.  I think my mind is playing tricks on me because I am nervous about my heart after the Mitral Clip procedure.  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  He denies palpitations, dyspnea, orthopnea, n, v,  dark/tarry/bloody stools, hematuria, dizziness, syncope, edema, weight gain.   Past Medical History    Past Medical History:  Diagnosis Date   Arthritis    neck and back    Chronic neck pain    Depression    ED (erectile dysfunction)    GERD (gastroesophageal reflux disease)    dysphagia   Gout    sees Dr. [name]    Hyperlipidemia    Hypertension    OSA (obstructive sleep apnea) 12/18/2017   Severe with AHI at 70.1/hr and is now on CPAP at 11cm H2o   Prostate cancer [name])    history of   Pulmonary embolism (HCC) 06/2019   S/P mitral valve clip implantation 01/24/2022   s/p Mitraclip XTW + NTW with Dr. Wonda and Dr. Wendel   Sleep apnea    snoring/never checked   Past Surgical History:  Procedure Laterality Date   AMPUTATION Left 07/18/2018   Procedure: LEFT RING FINGER REVISION AMPUTATION;  Surgeon: Murrell Drivers, MD;  Location: [facility] OR;  Service: Orthopedics;  Laterality: Left;   BACK SURGERY     CARDIAC CATHETERIZATION     CERVICAL FUSION  12/02/2007   per Dr. Alm Molt   COLONOSCOPY  03/11/2017   per Dr. Teressa, clear, no repeats needed    injection lower back Right 09/17/2016   IR IVC  FILTER PLMT / S&I /IMG GUID/MOD SED  11/15/2021   IR IVC FILTER RETRIEVAL / S&I /IMG GUID/MOD SED  12/14/2021   IR RADIOLOGIST EVAL & MGMT  10/30/2021   LUMBAR LAMINECTOMY/DECOMPRESSION MICRODISCECTOMY Left 11/21/2021   Procedure: Laminectomy and Foraminotomy - left - Lumbar three-four;  Surgeon: [name], MD;  Location: [facility] OR;  Service: Neurosurgery;  Laterality: Left;   PROSTATECTOMY  2000   RIGHT/LEFT HEART CATH AND CORONARY ANGIOGRAPHY N/A 12/20/2021   Procedure: RIGHT/LEFT HEART CATH AND CORONARY ANGIOGRAPHY;  Surgeon: [name], MD;  Location: [facility] INVASIVE CV LAB;   Service: Cardiovascular;  Laterality: N/A;   ROTATOR CUFF REPAIR Right 11/2017   TEE WITHOUT CARDIOVERSION N/A 01/01/2022   Procedure: TRANSESOPHAGEAL ECHOCARDIOGRAM (TEE);  Surgeon: [name], MD;  Location: [facility] ENDOSCOPY;  Service: Cardiovascular;  Laterality: N/A;   TEE WITHOUT CARDIOVERSION N/A 01/24/2022   Procedure: TRANSESOPHAGEAL ECHOCARDIOGRAM (TEE);  Surgeon: Thukkani, Arun K, MD;  Location: [facility] INVASIVE CV LAB;  Service: Cardiovascular;  Laterality: N/A;   TONSILLECTOMY     as a child   TOTAL HIP ARTHROPLASTY Left 05/27/2022   Procedure: TOTAL HIP ARTHROPLASTY ANTERIOR APPROACH;  Surgeon: [name], MD;  Location: [facility] ORS;  Service: Orthopedics;  Laterality: Left;   TRANSCATHETER MITRAL EDGE TO EDGE REPAIR N/A 01/24/2022   Procedure: MITRAL VALVE REPAIR;  Surgeon: [name], MD;  Location: [facility] INVASIVE CV LAB;  Service: Cardiovascular;  Laterality: N/A;   VASECTOMY      Allergies  No Known Allergies  Home Medications    Prior to Admission medications   Medication Sig Start Date End Date Taking? Authorizing Provider  allopurinol  (ZYLOPRIM ) 300 MG tablet TAKE 1 TABLET BY MOUTH EVERY DAY 04/13/24   [name] LABOR, MD  apixaban  (ELIQUIS ) 2.5 MG TABS tablet Take 1 tablet (2.5 mg total) by mouth 2 (two) times daily. 05/27/22   Orlando Camellia POUR, PA-C  atorvastatin  (LIPITOR) 40 MG tablet TAKE 1 TABLET BY MOUTH EVERY DAY 06/29/24   [name] LABOR, MD  colchicine  (COLCRYS ) 0.6 MG tablet Take by mouth daily. 12/03/15   [provider]  cyclobenzaprine  (FLEXERIL ) 10 MG tablet Take 1 tablet (10 mg total) by mouth 3 (three) times daily as needed for muscle spasms. 02/13/24   [name] LABOR, MD  diazepam  (VALIUM ) 10 MG tablet diazepam  10 mg tablet TAKE 1 TABLET BY MOUTH 30 MINUTES PRIOR TO MRI 08/07/18   [provider]  famotidine  (PEPCID ) 20 MG tablet Take 1 tablet (20 mg total) by mouth daily after supper. 04/25/22   [name] LABOR, MD  fluorouracil (EFUDEX) 5 %  cream Apply topically 2 (two) times daily. 12/30/22   [provider]  gabapentin  (NEURONTIN ) 100 MG capsule Take 1 capsule (100 mg total) by mouth 2 (two) times daily. 05/10/24   [name] LABOR, MD  Melatonin 12 MG TABS Take 12 mg by mouth at bedtime.    [provider]  METAMUCIL FIBER PO Take 1 Dose by mouth See admin instructions. Take 1 dose by mouth (scheduled) daily, may take a second dose in the afternoon as needed for constipation    [provider]  oxyCODONE -acetaminophen  (PERCOCET) 10-325 MG tablet Take 1 tablet by mouth every 6 (six) hours as needed for pain. 08/06/24   [name] LABOR, MD  oxyCODONE -acetaminophen  (PERCOCET) 10-325 MG tablet Take 1 tablet by mouth every 6 (six) hours as needed for pain. 08/06/24   [name] LABOR, MD  oxyCODONE -acetaminophen  (PERCOCET) 10-325 MG  tablet Take 1 tablet by mouth every 6 (six) hours as needed for pain. 08/06/24   [name] LABOR, MD  pantoprazole  (PROTONIX ) 40 MG tablet Take by mouth. 01/31/16   [provider]  PARoxetine  (PAXIL ) 20 MG tablet TAKE 1 TABLET BY MOUTH EVERY MORNING 04/13/24   [name] LABOR, MD  telmisartan -hydrochlorothiazide  (MICARDIS  HCT) 80-25 MG tablet TAKE 1 TABLET BY MOUTH EVERY DAY 06/14/24   [name] LABOR, MD    Physical Exam    Vital Signs:  [name] does not have vital signs available for review today.  Given telephonic nature of communication, physical exam is limited. AAOx3. NAD. Normal affect.  Speech and respirations are unlabored.  Accessory Clinical Findings    None  Assessment & Plan    1.  Preoperative Cardiovascular Risk Assessment: According to the Revised Cardiac Risk Index (RCRI), his Perioperative Risk of Major Cardiac Event is (%): 0.4  His Functional Capacity in METs is: 5.62 according to the Duke Activity Status Index (DASI). Therefore, based on ACC/AHA guidelines, patient would be at acceptable risk for the planned procedure without further cardiovascular  testing.   LHC 12/20/2021 with mild nonobstructive disease.  Echo 02/03/2023 with LVEF 60-65%, no RWMA.  Excellent exercise capacity with transient, nonexertional symptoms. He describes atypical chest pain that does not appear to be cardiac in nature. Patient was informed that if the symptoms change in any way to contact our office.   The patient mentioned our surgical request for a hip surgery was incorrect, and states he should be having a knee surgery.  He also states that someone in another office had mentioned to him that he was having hip surgery, which he clarified to them that he was supposed to be having a knee surgery.  From a cardiac perspective, the recommendations are the same regardless of knee or hip surgery.  I did advise the patient to reach out to his surgeons office to ensure clarification around this miscommunication moving forward.  The patient was advised that if he develops new symptoms prior to surgery to contact our office to arrange for a follow-up visit, and he verbalized understanding.  Eliquis  is managed by his PCP, Dr. Johnny, for hx of DVT/PE. Surgeon will need to contact that practice for recommendations.   A copy of this note will be routed to requesting surgeon.  Time:   Today, I have spent 10 minutes with the patient with telehealth technology discussing medical history, symptoms, and management plan.     Beyonka Pitney E Kysa Calais, NP  09/15/2024, 7:15 AM

## 2024-10-06 ENCOUNTER — Other Ambulatory Visit: Payer: Self-pay | Admitting: Family Medicine

## 2024-10-06 ENCOUNTER — Ambulatory Visit: Payer: Self-pay

## 2024-10-06 ENCOUNTER — Encounter: Payer: Self-pay | Admitting: Family Medicine

## 2024-10-06 ENCOUNTER — Ambulatory Visit (INDEPENDENT_AMBULATORY_CARE_PROVIDER_SITE_OTHER): Admitting: Family Medicine

## 2024-10-06 VITALS — BP 98/60 | HR 85 | Temp 97.7°F | Ht 73.0 in | Wt 241.0 lb

## 2024-10-06 DIAGNOSIS — J020 Streptococcal pharyngitis: Secondary | ICD-10-CM

## 2024-10-06 DIAGNOSIS — J029 Acute pharyngitis, unspecified: Secondary | ICD-10-CM | POA: Diagnosis not present

## 2024-10-06 LAB — POCT RAPID STREP A (OFFICE): Rapid Strep A Screen: POSITIVE — AB

## 2024-10-06 LAB — POC COVID19 BINAXNOW: SARS Coronavirus 2 Ag: NEGATIVE

## 2024-10-06 MED ORDER — AMOXICILLIN 875 MG PO TABS: 875.0000 mg | ORAL_TABLET | Freq: Two times a day (BID) | ORAL | 0 refills | Status: AC

## 2024-10-06 NOTE — Progress Notes (Signed)
   Acute Office Visit  Subjective:     Patient ID: Ricky Meyers, male    DOB: Dec 04, 1945, 78 y.o.   MRN: [id number]  Chief Complaint  Patient presents with   Sore Throat    X1 day    Sore Throat   Discussed the use of AI scribe software for clinical note transcription with the patient, who gave verbal consent to proceed.  History of Present Illness   Ricky Meyers is an 78 year old male who presents with a sore throat and difficulty swallowing.  He developed a sore throat on Monday that has worsened to difficulty swallowing. He has mild discomfort in his left ear. He denies significant ear pain, fever, chills, nasal congestion, chest pain, or trouble breathing. He takes Eliquis .        Review of Systems  All other systems reviewed and are negative.       Objective:    BP 98/60   Pulse 85   Temp 97.7 F (36.5 C) (Oral)   Ht 6' 1 (1.854 m)   Wt 241 lb (109.3 kg)   SpO2 98%   BMI 31.80 kg/m    Physical Exam Vitals reviewed.  Constitutional:      Appearance: He is well-developed. He is obese.  HENT:     Right Ear: Tympanic membrane normal.     Left Ear: Tympanic membrane normal.     Mouth/Throat:     Mouth: Mucous membranes are moist.     Pharynx: Posterior oropharyngeal erythema present.     Tonsils: No tonsillar exudate.  Cardiovascular:     Rate and Rhythm: Normal rate and regular rhythm.     Heart sounds: Normal heart sounds. No murmur heard. Musculoskeletal:     Cervical back: Neck supple.  Lymphadenopathy:     Cervical: No cervical adenopathy.  Neurological:     Mental Status: He is alert.     Results for orders placed or performed in visit on 10/06/24  POC COVID-19  Result Value Ref Range   SARS Coronavirus 2 Ag Negative Negative  POC Rapid Strep A  Result Value Ref Range   Rapid Strep A Screen Positive (A) Negative        Assessment & Plan:   Problem List Items Addressed This Visit   None Visit Diagnoses       Sore throat     -  Primary   Relevant Orders   POC COVID-19 (Completed)   POC Rapid Strep A (Completed)     Assessment and Plan    Streptococcal pharyngitis Acute streptococcal pharyngitis confirmed by positive strep test. Symptoms include sore throat and dysphagia since Monday. No fever, chills, or significant ear pain. No nasal congestion, chest pain, or dyspnea. Throat examination reveals erythema and irritation. No significant lymphadenopathy. COVID-19 test negative. Strep throat is easily treatable with antibiotics. - Prescribed amoxicillin , higher dose, to be taken twice daily for 10 days. - Advised to complete the full course of antibiotics even if symptoms improve. - Instructed on hygiene measures to prevent transmission, including avoiding sharing drinks and food, and practicing good hand hygiene. - Recommended acetaminophen  for throat pain management. - Suggested saltwater gargles and cough drops for symptomatic relief. - Sent prescription to on [place].        No orders of the defined types were placed in this encounter.   No follow-ups on file.  Heron CHRISTELLA Sharper, MD

## 2024-10-06 NOTE — Telephone Encounter (Signed)
 FYI Only or Action Required?: FYI only for provider: appointment scheduled on 10/06/24.  Patient was last seen in primary care on 08/06/2024 by Ricky Garnette LABOR, MD.  Called Nurse Triage reporting Sore Throat.  Symptoms began several days ago.  Interventions attempted: Other: throat lozenges.  Symptoms are: gradually worsening.  Triage Disposition: See PCP When Office is Open (Within 3 Days)  Patient/caregiver understands and will follow disposition?:  Reason for Disposition  [1] Sore throat is the only symptom AND [2] present > 48 hours  Answer Assessment - Initial Assessment Questions Sore throat x 2 days. Denies fever. Denies other symptoms. Using cough drops with no improvement.  1. ONSET: When did the throat start hurting? (Hours or days ago)      2 days 2. SEVERITY: How bad is the sore throat? (Scale 1-10; mild, moderate or severe)     5-6/10 3. STREP EXPOSURE: Has there been any exposure to strep within the past week? If Yes, ask: What type of contact occurred?      Not known, but was at a large gathering recently 4.  VIRAL SYMPTOMS: Are there any symptoms of a cold, such as a runny nose, cough, hoarse voice or red eyes?      Runny nose began after sore throat 5. FEVER: Do you have a fever? If Yes, ask: What is your temperature, how was it measured, and when did it start?     Denies 6. PUS ON THE TONSILS: Is there pus on the tonsils in the back of your throat?     Unknown 7. OTHER SYMPTOMS: Do you have any other symptoms? (e.g., difficulty breathing, headache, rash)     Denies  Protocols used: Sore Throat-A-AH Copied from CRM #[id number]. Topic: Clinical - Red Word Triage >> Oct 06, 2024  8:31 AM [name] wrote: [facility] that prompted transfer to Nurse Triage: sore throat x 2 days- worsening

## 2024-10-06 NOTE — Telephone Encounter (Signed)
 Noted

## 2024-11-15 ENCOUNTER — Ambulatory Visit: Admitting: Family Medicine

## 2024-11-15 ENCOUNTER — Encounter: Payer: Self-pay | Admitting: Family Medicine

## 2024-11-15 VITALS — BP 110/74 | HR 80 | Temp 97.9°F | Wt 241.0 lb

## 2024-11-15 DIAGNOSIS — G8929 Other chronic pain: Secondary | ICD-10-CM

## 2024-11-15 DIAGNOSIS — M159 Polyosteoarthritis, unspecified: Secondary | ICD-10-CM | POA: Diagnosis not present

## 2024-11-15 DIAGNOSIS — Z23 Encounter for immunization: Secondary | ICD-10-CM | POA: Diagnosis not present

## 2024-11-15 MED ORDER — OXYCODONE-ACETAMINOPHEN 10-325 MG PO TABS: 1.0000 | ORAL_TABLET | Freq: Four times a day (QID) | ORAL | 0 refills | Status: AC | PRN

## 2024-11-15 NOTE — Addendum Note (Signed)
 Addended by: LADONNA INOCENTE SAILOR on: 11/15/2024 11:27 AM   Modules accepted: Orders

## 2024-11-15 NOTE — Progress Notes (Signed)
" ° °  Subjective:    Patient ID: Ricky Meyers, male    DOB: 22-Dec-1945, 79 y.o.   MRN: [id number]  HPI Here for pain management. His pain has been fairly well controlled. We started hi on Gabapentin  last year, but he says it is too sedating for him.    Review of Systems  Constitutional: Negative.   Musculoskeletal:  Positive for arthralgias.       Objective:   Physical Exam Constitutional:      Appearance: Normal appearance.  Neurological:     Mental Status: He is alert.           Assessment & Plan:  Pain management. Indication for chronic opioid: OA Medication and dose: Percocet 10-325 # pills per month: 120 Last UDS date: 11-15-24 Opioid Treatment Agreement signed (Y/N): 01-15-19 Opioid Treatment Agreement last reviewed with patient:  11-15-24 [facility] reviewed this encounter (include red flags): Yes We refilled the Percocet. We will stop the Gabapentin .  Garnette Olmsted, MD   "

## 2024-11-18 LAB — DRUG MONITORING, PANEL 8 WITH CONFIRMATION, URINE
6 Acetylmorphine: NEGATIVE ng/mL
Alcohol Metabolites: POSITIVE ng/mL — AB
Amphetamines: NEGATIVE ng/mL
Benzodiazepines: NEGATIVE ng/mL
Buprenorphine, Urine: NEGATIVE ng/mL
Cocaine Metabolite: NEGATIVE ng/mL
Codeine: NEGATIVE ng/mL
Creatinine: 125 mg/dL
Ethyl Glucuronide (ETG): 5874 ng/mL — ABNORMAL HIGH
Ethyl Sulfate (ETS): 1643 ng/mL — ABNORMAL HIGH
Hydrocodone: NEGATIVE ng/mL
Hydromorphone: NEGATIVE ng/mL
MDMA: NEGATIVE ng/mL
Marijuana Metabolite: NEGATIVE ng/mL
Morphine: NEGATIVE ng/mL
Norhydrocodone: NEGATIVE ng/mL
Noroxycodone: 4848 ng/mL — ABNORMAL HIGH
Opiates: NEGATIVE ng/mL
Oxidant: NEGATIVE ug/mL
Oxycodone: 2965 ng/mL — ABNORMAL HIGH
Oxycodone: POSITIVE ng/mL — AB
Oxymorphone: 384 ng/mL — ABNORMAL HIGH
pH: 7 (ref 4.5–9.0)

## 2024-11-18 LAB — DM TEMPLATE

## 2024-11-22 NOTE — Progress Notes (Unsigned)
" °  Cardiology Office Note:  .   Date:  11/22/2024  ID:  Ricky, Meyers 1946/05/15, MRN [id number] PCP: [name] LABOR, MD  [facility] Providers Cardiologist:  Candyce Reek, MD Structural Heart:  Lurena MARLA Red, MD  History of Present Illness: .   No chief complaint on file.   Ricky Meyers is a 79 y.o. male with below history who presents for follow-up.   History of Present Illness               Problem List Non-obstructive CAD -LHC 12/2021: 25% D1 2. Severe mitral regurgitation  -TEER 01/2022 -mild to mod residual MR 3. HTN 4. HLD -T chol 155, HDL 64, LDL 70, TG 103 5. DVT/PE 6. LBBB    ROS: All other ROS reviewed and negative. Pertinent positives noted in the HPI.     Studies Reviewed: SABRA       TTE 02/03/2023  1. Left ventricular ejection fraction, by estimation, is 60 to 65%. The  left ventricle has normal function. The left ventricle has no regional  wall motion abnormalities. There is mild left ventricular hypertrophy.  Left ventricular diastolic function  could not be evaluated.   2. Right ventricular systolic function is normal. The right ventricular  size is normal.   3. Left atrial size was mild to moderately dilated.   4. Two MitraClips are present (Appear to be NTW located at A2-P2 and XTW  located at A3-P3). The more medial clip has increased mobility compared to  the mid-scallop clip, but both clips seem to have good attachment to both  mitral leaflets. There are two  eccentric jets of residual mitral insufficiency. The medial jet is more  significant, but both jets are probably mild. There is systolic dominant  pulmonary vein flow. The mitral valve is myxomatous. Mild to moderate  mitral valve regurgitation. No evidence   of mitral stenosis. The mean mitral valve gradient is 4.0 mmHg with  average heart rate of 77 bpm. There is a Mitra-Clip present in the mitral  position.   5. The aortic valve is normal in structure. Aortic valve  regurgitation is  not visualized. No aortic stenosis is present.   6. The inferior vena cava is normal in size with greater than 50%  respiratory variability, suggesting right atrial pressure of 3 mmHg.  Physical Exam:   VS:  There were no vitals taken for this visit.   Wt Readings from Last 3 Encounters:  11/15/24 241 lb (109.3 kg)  10/06/24 241 lb (109.3 kg)  12/01/23 230 lb (104.3 kg)    GEN: Well nourished, well developed in no acute distress NECK: No JVD; No carotid bruits CARDIAC: ***RRR, no murmurs, rubs, gallops RESPIRATORY:  Clear to auscultation without rales, wheezing or rhonchi  ABDOMEN: Soft, non-tender, non-distended EXTREMITIES:  No edema; No deformity  ASSESSMENT AND PLAN: .   Assessment and Plan                 {Are you ordering a CV Procedure (e.g. stress test, cath, DCCV, TEE, etc)?   Press F2        :[id number]}   Follow-up: No follow-ups on file.  Signed, Darryle DASEN. [name], MD, [facility]  [facility]  [address] [place], [place] [address] [phone]  9:34 AM   "

## 2024-11-29 ENCOUNTER — Ambulatory Visit: Admitting: Cardiovascular Disease

## 2024-11-29 DIAGNOSIS — Z9889 Other specified postprocedural states: Secondary | ICD-10-CM

## 2024-11-29 DIAGNOSIS — E782 Mixed hyperlipidemia: Secondary | ICD-10-CM

## 2024-11-29 DIAGNOSIS — I340 Nonrheumatic mitral (valve) insufficiency: Secondary | ICD-10-CM

## 2024-11-29 DIAGNOSIS — I2510 Atherosclerotic heart disease of native coronary artery without angina pectoris: Secondary | ICD-10-CM

## 2024-12-08 ENCOUNTER — Telehealth: Payer: Self-pay | Admitting: Family Medicine

## 2024-12-08 NOTE — Telephone Encounter (Signed)
 Eliquis  form to be filled out--placed in provider's folder.  Please let patient know when form is ready to be picked up [phone]

## 2024-12-09 NOTE — Telephone Encounter (Signed)
 The form is ready

## 2024-12-09 NOTE — Telephone Encounter (Signed)
 FYI Pt form received and has been placed in Dr Johnny red folder

## 2025-02-01 ENCOUNTER — Ambulatory Visit: Admitting: Cardiovascular Disease

## 2025-02-08 ENCOUNTER — Ambulatory Visit
